# Patient Record
Sex: Female | Born: 1935
Health system: Southern US, Community
[De-identification: ages and names within clinical notes are randomized; demographics above are authoritative.]

## PROBLEM LIST (undated history)

## (undated) DIAGNOSIS — K219 Gastro-esophageal reflux disease without esophagitis: Secondary | ICD-10-CM

## (undated) DIAGNOSIS — E079 Disorder of thyroid, unspecified: Secondary | ICD-10-CM

## (undated) DIAGNOSIS — R011 Cardiac murmur, unspecified: Secondary | ICD-10-CM

## (undated) DIAGNOSIS — I2510 Atherosclerotic heart disease of native coronary artery without angina pectoris: Secondary | ICD-10-CM

## (undated) DIAGNOSIS — I10 Essential (primary) hypertension: Secondary | ICD-10-CM

## (undated) DIAGNOSIS — E7800 Pure hypercholesterolemia, unspecified: Secondary | ICD-10-CM

## (undated) DIAGNOSIS — L989 Disorder of the skin and subcutaneous tissue, unspecified: Secondary | ICD-10-CM

## (undated) DIAGNOSIS — I6529 Occlusion and stenosis of unspecified carotid artery: Secondary | ICD-10-CM

## (undated) DIAGNOSIS — I480 Paroxysmal atrial fibrillation: Secondary | ICD-10-CM

## (undated) DIAGNOSIS — I219 Acute myocardial infarction, unspecified: Secondary | ICD-10-CM

## (undated) HISTORY — PX: ABDOMINAL HYSTERECTOMY: SHX81

## (undated) HISTORY — DX: Disorder of the skin and subcutaneous tissue, unspecified: L98.9

## (undated) HISTORY — PX: THYROID SURGERY: SHX805

## (undated) HISTORY — DX: Paroxysmal atrial fibrillation: I48.0

## (undated) HISTORY — DX: Cardiac murmur, unspecified: R01.1

## (undated) HISTORY — PX: CARDIAC CATHETERIZATION: SHX172

## (undated) HISTORY — DX: Occlusion and stenosis of unspecified carotid artery: I65.29

## (undated) HISTORY — DX: Atherosclerotic heart disease of native coronary artery without angina pectoris: I25.10

---

## 2000-11-20 ENCOUNTER — Ambulatory Visit (HOSPITAL_COMMUNITY): Admission: RE | Admit: 2000-11-20 | Discharge: 2000-11-20 | Payer: Self-pay | Admitting: Family Medicine

## 2000-11-20 ENCOUNTER — Encounter: Payer: Self-pay | Admitting: Family Medicine

## 2001-11-20 ENCOUNTER — Ambulatory Visit (HOSPITAL_COMMUNITY): Admission: RE | Admit: 2001-11-20 | Discharge: 2001-11-20 | Payer: Self-pay | Admitting: Family Medicine

## 2001-11-20 ENCOUNTER — Encounter: Payer: Self-pay | Admitting: Family Medicine

## 2001-11-26 ENCOUNTER — Ambulatory Visit (HOSPITAL_COMMUNITY): Admission: RE | Admit: 2001-11-26 | Discharge: 2001-11-26 | Payer: Self-pay | Admitting: General Surgery

## 2002-11-24 ENCOUNTER — Ambulatory Visit (HOSPITAL_COMMUNITY): Admission: RE | Admit: 2002-11-24 | Discharge: 2002-11-24 | Payer: Self-pay | Admitting: Family Medicine

## 2002-12-10 ENCOUNTER — Ambulatory Visit (HOSPITAL_COMMUNITY): Admission: RE | Admit: 2002-12-10 | Discharge: 2002-12-10 | Payer: Self-pay | Admitting: Family Medicine

## 2003-12-01 ENCOUNTER — Ambulatory Visit (HOSPITAL_COMMUNITY): Admission: RE | Admit: 2003-12-01 | Discharge: 2003-12-01 | Payer: Self-pay | Admitting: Family Medicine

## 2004-08-25 ENCOUNTER — Ambulatory Visit: Payer: Self-pay | Admitting: *Deleted

## 2004-08-30 ENCOUNTER — Ambulatory Visit (HOSPITAL_COMMUNITY): Admission: RE | Admit: 2004-08-30 | Discharge: 2004-08-30 | Payer: Self-pay | Admitting: *Deleted

## 2004-08-30 ENCOUNTER — Ambulatory Visit: Payer: Self-pay | Admitting: Cardiology

## 2004-09-06 ENCOUNTER — Ambulatory Visit: Payer: Self-pay | Admitting: *Deleted

## 2004-09-20 ENCOUNTER — Ambulatory Visit (HOSPITAL_COMMUNITY): Admission: RE | Admit: 2004-09-20 | Discharge: 2004-09-20 | Payer: Self-pay | Admitting: Family Medicine

## 2004-12-02 ENCOUNTER — Ambulatory Visit (HOSPITAL_COMMUNITY): Admission: RE | Admit: 2004-12-02 | Discharge: 2004-12-02 | Payer: Self-pay | Admitting: Family Medicine

## 2004-12-13 ENCOUNTER — Ambulatory Visit (HOSPITAL_COMMUNITY): Admission: RE | Admit: 2004-12-13 | Discharge: 2004-12-13 | Payer: Self-pay | Admitting: General Surgery

## 2005-12-04 ENCOUNTER — Ambulatory Visit (HOSPITAL_COMMUNITY): Admission: RE | Admit: 2005-12-04 | Discharge: 2005-12-04 | Payer: Self-pay | Admitting: Family Medicine

## 2006-03-28 ENCOUNTER — Inpatient Hospital Stay (HOSPITAL_COMMUNITY): Admission: EM | Admit: 2006-03-28 | Discharge: 2006-04-03 | Payer: Self-pay | Admitting: Emergency Medicine

## 2006-04-20 ENCOUNTER — Inpatient Hospital Stay (HOSPITAL_COMMUNITY): Admission: EM | Admit: 2006-04-20 | Discharge: 2006-04-27 | Payer: Self-pay | Admitting: Emergency Medicine

## 2006-04-21 ENCOUNTER — Encounter (INDEPENDENT_AMBULATORY_CARE_PROVIDER_SITE_OTHER): Payer: Self-pay | Admitting: Specialist

## 2006-12-07 ENCOUNTER — Ambulatory Visit (HOSPITAL_COMMUNITY): Admission: RE | Admit: 2006-12-07 | Discharge: 2006-12-07 | Payer: Self-pay | Admitting: Family Medicine

## 2007-01-30 ENCOUNTER — Ambulatory Visit (HOSPITAL_COMMUNITY): Admission: RE | Admit: 2007-01-30 | Discharge: 2007-01-30 | Payer: Self-pay | Admitting: Family Medicine

## 2007-12-10 ENCOUNTER — Ambulatory Visit (HOSPITAL_COMMUNITY): Admission: RE | Admit: 2007-12-10 | Discharge: 2007-12-10 | Payer: Self-pay | Admitting: Family Medicine

## 2008-12-11 ENCOUNTER — Ambulatory Visit (HOSPITAL_COMMUNITY): Admission: RE | Admit: 2008-12-11 | Discharge: 2008-12-11 | Payer: Self-pay | Admitting: Family Medicine

## 2009-05-10 ENCOUNTER — Inpatient Hospital Stay (HOSPITAL_COMMUNITY): Admission: EM | Admit: 2009-05-10 | Discharge: 2009-05-15 | Payer: Self-pay | Admitting: Cardiology

## 2009-12-13 ENCOUNTER — Ambulatory Visit (HOSPITAL_COMMUNITY)
Admission: RE | Admit: 2009-12-13 | Discharge: 2009-12-13 | Payer: Self-pay | Source: Home / Self Care | Attending: Family Medicine | Admitting: Family Medicine

## 2010-01-02 HISTORY — PX: CARDIOVERSION: SHX1299

## 2010-01-22 ENCOUNTER — Encounter: Payer: Self-pay | Admitting: Family Medicine

## 2010-03-22 LAB — CBC
HCT: 34 % — ABNORMAL LOW (ref 36.0–46.0)
HCT: 35.2 % — ABNORMAL LOW (ref 36.0–46.0)
HCT: 38.9 % (ref 36.0–46.0)
Hemoglobin: 12.1 g/dL (ref 12.0–15.0)
Hemoglobin: 12.2 g/dL (ref 12.0–15.0)
Hemoglobin: 12.4 g/dL (ref 12.0–15.0)
Hemoglobin: 12.5 g/dL (ref 12.0–15.0)
Hemoglobin: 13.5 g/dL (ref 12.0–15.0)
MCHC: 34.7 g/dL (ref 30.0–36.0)
MCHC: 34.8 g/dL (ref 30.0–36.0)
MCV: 94.4 fL (ref 78.0–100.0)
MCV: 94.7 fL (ref 78.0–100.0)
MCV: 95.2 fL (ref 78.0–100.0)
Platelets: 259 10*3/uL (ref 150–400)
Platelets: 273 10*3/uL (ref 150–400)
Platelets: 311 10*3/uL (ref 150–400)
RBC: 3.6 MIL/uL — ABNORMAL LOW (ref 3.87–5.11)
RBC: 3.69 MIL/uL — ABNORMAL LOW (ref 3.87–5.11)
RBC: 3.85 MIL/uL — ABNORMAL LOW (ref 3.87–5.11)
RBC: 4.11 MIL/uL (ref 3.87–5.11)
RDW: 12.3 % (ref 11.5–15.5)
RDW: 12.4 % (ref 11.5–15.5)
RDW: 12.5 % (ref 11.5–15.5)
WBC: 4 10*3/uL (ref 4.0–10.5)
WBC: 4.3 10*3/uL (ref 4.0–10.5)
WBC: 5.4 10*3/uL (ref 4.0–10.5)

## 2010-03-22 LAB — COMPREHENSIVE METABOLIC PANEL
ALT: 20 U/L (ref 0–35)
BUN: 25 mg/dL — ABNORMAL HIGH (ref 6–23)
CO2: 24 mEq/L (ref 19–32)
Calcium: 9.4 mg/dL (ref 8.4–10.5)
Creatinine, Ser: 1.07 mg/dL (ref 0.4–1.2)
GFR calc non Af Amer: 49 mL/min — ABNORMAL LOW (ref >60)
Glucose, Bld: 82 mg/dL (ref 70–99)

## 2010-03-22 LAB — TSH: TSH: 0.15 u[IU]/mL — ABNORMAL LOW (ref 0.350–4.500)

## 2010-03-22 LAB — BASIC METABOLIC PANEL
BUN: 19 mg/dL (ref 6–23)
BUN: 24 mg/dL — ABNORMAL HIGH (ref 6–23)
CO2: 23 mEq/L (ref 19–32)
CO2: 26 mEq/L (ref 19–32)
CO2: 27 mEq/L (ref 19–32)
Calcium: 8.8 mg/dL (ref 8.4–10.5)
Calcium: 9.2 mg/dL (ref 8.4–10.5)
Chloride: 108 mEq/L (ref 96–112)
Chloride: 112 mEq/L (ref 96–112)
GFR calc Af Amer: 55 mL/min — ABNORMAL LOW (ref >60)
GFR calc Af Amer: 57 mL/min — ABNORMAL LOW (ref >60)
GFR calc non Af Amer: 39 mL/min — ABNORMAL LOW (ref >60)
GFR calc non Af Amer: 45 mL/min — ABNORMAL LOW (ref >60)
GFR calc non Af Amer: 47 mL/min — ABNORMAL LOW (ref >60)
Glucose, Bld: 91 mg/dL (ref 70–99)
Glucose, Bld: 92 mg/dL (ref 70–99)
Potassium: 4.2 mEq/L (ref 3.5–5.1)
Potassium: 4.5 mEq/L (ref 3.5–5.1)
Sodium: 141 mEq/L (ref 135–145)
Sodium: 141 mEq/L (ref 135–145)
Sodium: 143 mEq/L (ref 135–145)

## 2010-03-22 LAB — T3, FREE: T3, Free: 1.8 pg/mL — ABNORMAL LOW (ref 2.3–4.2)

## 2010-03-22 LAB — PROTIME-INR
INR: 1.16 (ref 0.00–1.49)
INR: 1.5 — ABNORMAL HIGH (ref 0.00–1.49)
Prothrombin Time: 14.7 seconds (ref 11.6–15.2)

## 2010-03-22 LAB — HEPARIN LEVEL (UNFRACTIONATED)
Heparin Unfractionated: 0.48 IU/mL (ref 0.30–0.70)
Heparin Unfractionated: 0.66 IU/mL (ref 0.30–0.70)

## 2010-05-20 NOTE — Discharge Summary (Signed)
[name], [name]               ACCOUNT NO.:  [id number]   MEDICAL RECORD NO.:  [id number]          PATIENT TYPE:  INP   LOCATION:  3712                         FACILITY:  [facility]   PHYSICIAN:  Madaline Savage, M.D.DATE OF BIRTH:  1920/02/26   DATE OF ADMISSION:  03/28/2006  DATE OF DISCHARGE:  04/03/2006                               DISCHARGE SUMMARY   PRIMARY CARE PHYSICIAN:  Dr. Patrica Duel.   DISCHARGE DIAGNOSES:  1. Two-vessel coronary artery disease.  2. Paroxysmal atrial fibrillation, new onset.  3. Coumadin therapy, recent initiation.  4. History of hypertension.  5. Chest pain/stable angina.  6. Hypothyroidism.  7. Normal left ventricular function.   HISTORY OF PRESENT ILLNESS:  [name] is an 77 year old female who  was admitted on March 28, 2006 with atrial fibrillation with rapid  ventricular response which was accompanied by chest pain.  She was seen  by Dr. Chanda Busing on March 27, 2006 and found to be in atrial  fibrillation and she was started on Coumadin, digoxin as well as beta  blocker therapy.  The following day, she was also scheduled for  echocardiogram as well as Persantine Myoview.  The following day, she  experienced rapid atrial fibrillation and presented to see Dr. Sherwood Gambler and  was therefore transported to [facility] for further evaluation  and treatment.  She underwent left heart catheterization and coronary  arteriogram, which revealed high-grade ostial narrowing of the  circumflex, the left main and ostial narrowing of the first diagonal at  the takeoff of the LAD.  There was LVH and normal LV function.  Her  Coumadin was held at that point.  It was decided to proceed with medical  therapy, despite her two-vessel coronary artery disease as opposed to  undergoing surgical intervention; amiodarone therapy was initiated at  that time for atrial fibrillation and she was restarted on her Coumadin.  She continued to experience some  shortness of breath with activity and  was also noted to have a positive H. pylori titer.  On April 01, 2006,  it was noted that her heart rate had dropped into the 30s and 40s  overnight, although she was asymptomatic; her digoxin therapy was  decreased to 0.125 one-half tablet daily, her Lopressor was continued  and 25 b.i.d. and she was monitored once again overnight.  On the day of  discharge, PT was 38.3, her INR was 3.6, RBC 332, hemoglobin 10.6,  hematocrit 30.5.  It was thought that her INR had increased secondary to  antibiotic therapy for her H. pylori.  Her Coumadin was healed and a.m.  labs were followed.  She was complaint-free April 03, 2006.  Her  amiodarone dose was decreased to 300 mg daily and was deemed ready for  discharge by Dr. Alanda Amass on April 02, 2005.  At that time, she had no  reports of GI discomfort and no chest pain.   DIET:  She is to follow a heart-healthy diet.   ACTIVITY:  She is to increase her activity slowly and may shower and  bathe.   FOLLOWUP:  Upon  discharge, she would need to have a PT/INR drawn the  following morning and our office will contact her with the results.  She  is to follow up with Dr. Elsie Lincoln in 2-3 weeks and our office will contact  her for an appointment.   DISCHARGE MEDICATIONS:  1. Coumadin 5 mg daily; she is to hold until her next INR is drawn on      April 04, 2006.  2. Synthroid 100 mcg daily.  3. Benicar HCT 20/2.5.  4. Ogen 1.25 mg daily.  5. Aspirin 81 mg daily.  6. Zocor 40 mg daily.  7. Imdur 30 mg daily.  8. Amiodarone 200 mg; she is to take one and a half tablets daily.  9. Amoxicillin 1000 mg b.i.d. for 9 days.  10.Biaxin 500 mg b.i.d. for 9 days.  11.Nexium 40 mg b.i.d. for 9 days, then 40 mg once daily.  12.Toprol-XL 25 mg daily.  13.She is to discontinue her digoxin.     ______________________________  Charmian Muff, NP    ______________________________  Madaline Savage, M.D.    LS/MEDQ  D:   05/29/2006  T:  05/30/2006  Job:  [id number]   cc:   Patrica Duel, M.D.

## 2010-05-20 NOTE — Cardiovascular Report (Signed)
[name], [name]               ACCOUNT NO.:  [id number]   MEDICAL RECORD NO.:  [id number]          PATIENT TYPE:  INP   LOCATION:  3712                         FACILITY:  [facility]   PHYSICIAN:  Thereasa Solo. Little, M.D. DATE OF BIRTH:  [birth year]   DATE OF PROCEDURE:  03/28/2006  DATE OF DISCHARGE:                            CARDIAC CATHETERIZATION   INDICATIONS:  This 77 year old female has had chest pain for about a  month in duration.  She was seen on March 27, 2006 and was in atrial  fibrillation, started on Coumadin and set up for evaluation.  This  morning she presented to Dr. Sharyon Medicus office in rapid atrial  fibrillation, continued to have chest pain and was sent to [facility]  Emergency Room for evaluation.   After obtaining informed consent the patient was prepped and draped in  the usual sterile fashion exposing the right groin.  Following  local  anesthetic with 1% Xylocaine, the Seldinger technique was employed and a  5-French introducer sheath was placed into the right femoral artery.  Left and right coronary arteriography, ventriculography in the RAO  projection and a distal aortogram was performed.   EQUIPMENT:  5-French Judkins configuration catheters.   TOTAL CONTRAST USED:  100 mL.   COMPLICATIONS:  None.   RESULTS:  1. Hemodynamic monitoring:  Central aortic pressure was 184/80.  Left      ventricular pressure was 184/22 with no significant valve gradient      at the time of pull-back.  2. Ventriculography:  Ventriculography in the RAO projection using 20      mL of contrast, 12 mL per second, revealed normal LV systolic      function, mild left ventricular hypertrophy.  Ejection fraction      excess of 60%  and the end-diastolic pressure was 25.  3. Distal aortogram:  Distal aortogram done above the level of renal      artery showed no evidence of renal artery stenosis and no abdominal      aortic aneurysm.   CORONARY ARTERIOGRAPHY:  On fluoroscopy, faint  calcification was seen in  the distribution of the left main,  proximal LAD and proximal right  coronary artery.   1. Left main normal.  It trifurcated.  2. Circumflex.  There was ostial 80% narrowing of the circumflex as it      came off the left main.  The mid and distal vessel were free of      disease.  3. Ramus intermediate (optional diagonal) moderate size and normal.  4. Left anterior descending.  The LAD extended down to the apex of the      heart.  The distal LAD was free of disease.  The mid portion of the      LAD had an area of 30-40% narrowing at the takeoff of first      diagonal.  The first diagonal had an 85%  area of ostial narrowing.  5. Right coronary artery.  The right coronary artery gave rise to the      PDA.  This vessel was free of disease.  Several injections of the      cusp did not show any evidence of ostial narrowing.   CONCLUSION:  1. High-grade ostial narrowing of the circumflex at the left main.  2. Ostial narrowing of the first diagonal at the take-off of the LAD.  3. Left ventricular hypertrophy.  4. Normal LV systolic function.   It appears this lady will need bypass surgery because of the ostial  circumflex lesion.  I plan to ask my colleagues for their opinion before  we refer her to CV[facility].  She will be restarted on IV heparin in about 3  hours following her cath.  I have temporarily stopped her Coumadin.           ______________________________  Thereasa Solo. Little, M.D.     ABL/MEDQ  D:  03/28/2006  T:  03/29/2006  Job:  [id number]   cc:   Madaline Savage, M.D.  Madelin Rear. Sherwood Gambler, MD

## 2010-05-20 NOTE — H&P (Signed)
[name], FITE               ACCOUNT NO.:  [id number]   MEDICAL RECORD NO.:  [id number]          PATIENT TYPE:  AMB   LOCATION:                                FACILITY:  [facility]   PHYSICIAN:  Dalia Heading, M.D.  DATE OF BIRTH:  1920/04/07   DATE OF ADMISSION:  DATE OF DISCHARGE:  LH                                HISTORY & PHYSICAL   CHIEF COMPLAINT:  History of colon polyps.   HISTORY OF PRESENT ILLNESS:  The patient is an 77 year old black female  status post a colonoscopy with polypectomy for a tubular adenoma in the  ascending colon.  In 2003 who now presents for a followup colonoscopy.  She  has been asymptomatic.  There is no family history of colon carcinoma.   PAST MEDICAL HISTORY:  1.  Hypertension.  2.  Hypothyroidism.   PAST SURGICAL HISTORY:  As noted above, hysterectomy, thyroid surgery.   CURRENT MEDICATIONS:  Flexeril, Norvasc, Synthroid, estrogen supplements,  baby aspirin, hydrochlorothiazide, Ativan, Nexium, lisinopril, Allegra.   ALLERGIES:  No known drug allergies.   REVIEW OF SYSTEMS:  Noncontributory.   PHYSICAL EXAMINATION:  GENERAL:  On physical examination, the patient is a  well-developed well-nourished black female in no acute distress.  LUNGS:  Clear to auscultation with equal breath sounds bilaterally.  HEART:  Heart examination reveals a regular rate and rhythm without S3, S4,  or murmurs.  ABDOMEN:  The abdomen is soft, nontender, nondistended.  No  hepatosplenomegaly, or masses are noted.  RECTAL:  Examination was deferred to the procedure.   IMPRESSION:  History of colon polyp.   PLAN:  The patient is scheduled for a colonoscopy on 12/13/2004.  The risks  and benefits of the procedure including bleeding and perforation were fully  explained to the patient, who gave informed consent.      Dalia Heading, M.D.  Electronically Signed     MAJ/MEDQ  D:  12/01/2004  T:  12/01/2004  Job:  [id number]   cc:   [facility]  Fax: [phone]   Oneal Deputy. Juanetta Gosling, M.D.  Fax: [phone]

## 2010-05-20 NOTE — H&P (Signed)
   NAMEDEYCI, GESELL NO.:  [id number]   MEDICAL RECORD NO.:  [id number]                  PATIENT TYPE:   LOCATION:                                       FACILITY:   PHYSICIAN:  Dalia Heading, M.D.               DATE OF BIRTH:   DATE OF ADMISSION:  DATE OF DISCHARGE:                                HISTORY & PHYSICAL   DATE OF BIRTH:  1920-04-29.   CHIEF COMPLAINT:  Need for screening colonoscopy.   HISTORY OF PRESENT ILLNESS:  The patient is a 77 year old black female who  was referred for screening colonoscopy.  She denies any abdominal  complaints.  She has never had a colonoscopy.  She denies any immediate  family history of colon carcinoma.   PAST MEDICAL HISTORY:  1. Hypertension.  2. Hypothyroidism.   PAST SURGICAL HISTORY:  1. Hysterectomy.  2. Thyroid surgery.   CURRENT MEDICATIONS:  1. A thyroid pill.  2. Hormone replacement pills.  3. Norvasc.  4. A fluid pill.   ALLERGIES:  NO KNOWN DRUG ALLERGIES.   REVIEW OF SYSTEMS:  Noncontributory.   PHYSICAL EXAMINATION:  GENERAL:  The patient is a well-developed, well-  nourished black female in no distress.  VITAL SIGNS:  She is afebrile, vital signs are stable.  LUNGS:  Clear to auscultation with equal breath sounds bilaterally.  HEART:  Regular rate and rhythm without S3, S4, or murmurs.  ABDOMEN:  Unremarkable.  RECTAL:  Examination was deferred to the procedure.   IMPRESSION:  Need for screening colonoscopy.    PLAN:  The patient is scheduled for a colonoscopy on 11/26/2001.  The risks  and benefits of the procedure, including bleeding and perforation were fully  explained to the patient, and she gave informed consent.                                                Dalia Heading, M.D.    MAJ/MEDQ  D:  11/21/2001  T:  11/21/2001  Job:  [id number]

## 2010-05-20 NOTE — Op Note (Signed)
NAMEDELYNN, OLVERA               ACCOUNT NO.:  [id number]   MEDICAL RECORD NO.:  [id number]          PATIENT TYPE:  INP   LOCATION:  2550                         FACILITY:  [facility]   PHYSICIAN:  Ardeth Sportsman, MD     DATE OF BIRTH:  1920/07/08   DATE OF PROCEDURE:  04/21/2006  DATE OF DISCHARGE:                               OPERATIVE REPORT   SURGEON:  Ardeth Sportsman, MD.   ASSISTANT:  Angelia Mould. Derrell Lolling, MD.   PREOPERATIVE DIAGNOSIS:  Hematoperitoneum, status post fall, with  significant drop in hematocrit.   POSTOPERATIVE DIAGNOSES:  1. Hematoperitoneum.  2. Proximal jejunal contusion with circumferential wall hematoma and      mesenteric injury.   PROCEDURES PERFORMED:  1. Exploratory laparotomy.  2. Lysis of adhesions.  3. Proximal jejunal-small bowel resection with stapled side-to-side      anastomosis.  4. Repair of mesenteric injury.   SPECIMENS:  Proximal jejunum.   DRAINS:  None.   ESTIMATED BLOOD LOSS:  450 mL of old blood plus 50 mm of active blood,  equals 500 total.   COMPLICATIONS:  No major complications apparent.   INDICATIONS:  Miss Rinck is an 77 year old female on chronic  anticoagulation, her fell in her bathroom and ended up having cervical  and orbital fractures.  She was admitted on telemetry and monitoring,  and her hemoglobin dropped from 13 down to 7, with lightheadedness and  worsening abdominal pain.  CAT scan showed evidence of free fluid in the  abdomen, with Hounsfield units correlating with hemoperitoneum, as well  as some small bowel and mesenteric thickening, concerning for mesenteric  injury.  The spleen seemed somewhat equivocal, possibly injured as well.   Differential diagnoses were explained.  Options were discussed.  Recommendation was made for emergent exploration, given the sudden drop  in hemoglobin.  Risks such as stroke, heart attack, deep vein  thrombosis, pulmonary embolism and death were discussed, and risks such  as bleeding, need for transfusion, wound infection, abscess, injury to  other organs and other risks were discussed.  Questions were answered  and she and her family agreed to proceed.   OPERATIVE FINDINGS:  She had 450 mL of old blood.  Her solid organs  appeared to be normal.  In her proximal jejunum about a foot distal to  the ligament of Treitz, she had a segment of jejunum that had  circumferential wall-thickness hematoma and near occlusion, with  mesenteric contusion as well.  She had no significant retroperitoneal  bleeding and no other significant injuries, except for some small  mesenteric tears as well.   DESCRIPTION OF PROCEDURES:  Informed consent was confirmed.  The patient  received IV cefazolin.  She had sequential compression devices active  for the entire case.  She underwent general anesthesia without  difficulty.  She had a Foley catheter placed.  She was positioned supine  with her arms out.  Her abdomen was prepped and draped in a sterile  fashion.   Entry was gained in the abdomen through a midline incision.  I  encountered a fair amount of  old blood in all 4 quadrants.  There were  no major clots.  These were all aspirated out.  Inspection was made in  the right upper quadrant, seeing no evidence of any obvious injury, and  the same thing in the left upper quadrant.  Down in the pelvis, there  was some evidence of old blood as well.  There were some omental  attachments and small bowel attachments in the pelvis that were freed  up, using careful controlled, primarily sharp, as well as controlled  cautery dissection.  The small bowel was run from the terminal ileum all  the way to the ligament of Treitz.  In the proximal jejunum, small bowel  thickness and hematoma were encountered, as noted above.  There were  some mesenteric tears, that seemed to be not particularly actively  bleeding at this time.  The colon was run from the right colon all the  way down to  the sigmoid-rectal junction at the peritoneal reflection  with no evidence of injury.  A window was made in the lesser sac.  Inspection showed a little bit of old blood, but no evidence of any  active bleeding.  The pancreas and the posterior wall of the stomach and  anterior wall of the stomach appeared to be normal as well.  She did not  have any significant hematoma along her duodenal sweep either.  Copious  irrigation was done with ultimately pretty good clear return.  She had  some oozing down in her pelvis where this was freed up.  The spleen and  liver were reinspected with no evidence of any injury.   Small bowel resection was formed and a side-to-side anastomosis with  stapling, using a fire of 2 stapling loads.  The mesentery was taken  using the LigaSure with a good result.  This left a wide-open  anastomosis.  There was a little bit of bruising of the wall, but the  anastomosis appeared to be nice and viable.  The mesenteric defect was  closed using interrupted figure-of-eight 3-0 silk stitches.  Again,  copious irrigation was done.  Some small mesenteric tears were  controlled and had hemostasis insured.  Again, on reirrigation and  reexploration in all 4 quadrants of pelvis, there was noted no evidence  of any other injury.  The small bowel was allowed to rest naturally.  Greater omentum was covered in the midline.  The midline defect was  closed using #1 looped PDS x2.  Copious irrigation of the wound.  The  wound was gently approximated using staples. Triple antibiotic and  sterile dressings were applied.  The patient was extubated and sent to  the recovery room in stable condition.   I explained the operative findings to the patient's family and probable  postoperative recovery, including at least monitoring in the Step-Down  tonight, which was discussed as well.  Questions were answered and they  expressed understanding and appreciation.     Ardeth Sportsman, MD   Electronically Signed     SCG/MEDQ  D:  04/21/2006  T:  04/21/2006  Job:  [id number]   cc:   Patrica Duel, M.D.

## 2010-05-20 NOTE — Consult Note (Signed)
[name], [name]               ACCOUNT NO.:  [id number]   MEDICAL RECORD NO.:  [id number]          PATIENT TYPE:  INP   LOCATION:  2550                         FACILITY:  [facility]   PHYSICIAN:  Stefani Dama, M.D.  DATE OF BIRTH:  1920/01/10   DATE OF CONSULTATION:  04/20/2006  DATE OF DISCHARGE:                                 CONSULTATION   REASON FOR REQUEST:  C5-C6 spinous process fractures.   HISTORY OF PRESENT ILLNESS:  [name] in an 77 year old individual  who sustained a fall today.  She apparently felt lightheaded and dizzy  and hit her head on the left side.  She sustained fractures in the  spinous processes of C5-C6.  Patient denies any pain in her neck.  She  denies any numbness or tingling after she had the fall or currently in  her arms or hands.   PAST MEDICAL HISTORY:  Significant for:  1. Chronic Coumadin usage with chronic atrial fibrillation.  2. Hypertension.  3. Hypothyroidism.   Her physical exam at the current time reveals that she is an alert  oriented individual with no overt distress.  She has a left periorbital  ecchymosis.  The neck is nontender to palpation.  No masses are noted.  Her motor strength is good in the upper extremities with normal grip and  intrinsic function.  Normal strength in deltoids, biceps and triceps.  Lower extremities move fluidly.  Deep tendon reflexes are 1+ in the  biceps, triceps, and the brachioradialis, 2+ in the patellae and  Achilles.   IMPRESSION:  The patient has evidence of spinous processes fractures of  C5-C6 which are minimally displaced.  There is no gross instability in  the cervical spine.  I believe the patient would best be treated  conservatively in a hard cervical collar for a period of about four  weeks.  I will follow up with her on an outpatient basis.      Stefani Dama, M.D.  Electronically Signed     HJE/MEDQ  D:  04/20/2006  T:  04/21/2006  Job:  [id number]

## 2010-05-20 NOTE — Discharge Summary (Signed)
[name], [name]               ACCOUNT NO.:  [id number]   MEDICAL RECORD NO.:  [id number]          PATIENT TYPE:  INP   LOCATION:  6708                         FACILITY:  [facility]   PHYSICIAN:  Maisie Fus A. Cornett, M.D.DATE OF BIRTH:  08/26/1920   DATE OF ADMISSION:  04/20/2006  DATE OF DISCHARGE:  04/27/2006                               DISCHARGE SUMMARY   DISCHARGE DIAGNOSES:  1. Fall.  2. Mesenteric and small-bowel injury, status post exploratory      laparotomy with repair and small-bowel resection.  3. Atrial fibrillation.  4. C5-6 fracture.  5. Acute blood loss anemia.  6. Left orbital fracture   CONSULTANTS:  1. [facility].  2. Dr. Danielle Dess for Neurosurgery.   PROCEDURES:  Exploratory laparotomy with proximal jejunal resection.   HISTORY OF PRESENT ILLNESS:  This is an 77 year old black female who  fell coming from her bathroom in her home, striking the left side of her  head.  She came in as a non-trauma code and workup in the emergency  department should 5 and 6 process fractures extending into the lamina,  also with a nondisplaced left orbital rim fracture.  She became  nauseated and had emesis while being transported.  Her workup  demonstrated the cervical fractures, the atrial fibrillation and the  orbital fracture, as well as a supratherapeutic INR.  She was admitted  to Telemetry to correct her INR and observe her and Cardiology and  Neurosurgery were consulted.   HOSPITAL COURSE:  The patient remained stable from a cardiovascular  standpoint.  She was placed in a collar in order to stabilize her C-  spine fractures.  The day following admission, she was having some  increased abdominal pain and generally did not feel good.  A repeat CBC  had shown a significant drop in her hemoglobin and she was taken  urgently to the operating room for exploratory laparotomy; this  demonstrated a small bowel injury with mesenteric tear  in the jejunum,  which was repaired.  She then had the usual postoperative ileus which  resolved with time.  She was somewhat slow to mobilize, but eventually  was able to do so and eventually we were able to transfer her to home  with home health.   DISCHARGE MEDICATIONS:  1. Vicodin 5/500 take one to two p.o. q.6 h. p.r.n. pain.  2. In addition, she is to resume her home medications.   FOLLOWUP:  The patient will follow up in the trauma service clinic  within the next week.      Earney Hamburg, P.A.      Thomas A. Cornett, M.D.  Electronically Signed    MJ/MEDQ  D:  06/22/2006  T:  06/23/2006  Job:  [id number]

## 2010-05-20 NOTE — H&P (Signed)
NAMESHELISHA, [name]               ACCOUNT NO.:  [id number]   MEDICAL RECORD NO.:  [id number]          PATIENT TYPE:  INP   LOCATION:  2014                         FACILITY:  [facility]   PHYSICIAN:  Maria Dare. Janee Mayo, M.D.DATE OF BIRTH:  01/30/1920   DATE OF ADMISSION:  04/20/2006  DATE OF DISCHARGE:                              HISTORY & PHYSICAL   CHIEF COMPLAINT:  Head and neck pain after a fall.   HISTORY OF PRESENT ILLNESS:  The patient is an 77 year old African-  American female who has a history of atrial fibrillation and coronary  artery disease.  She recently underwent cardiac cath a couple of weeks  ago by Dr. Clarene Mayo.  She was coming back from the bathroom at home this  morning and she fell striking her left side of her forehead.  Workup in  the emergency department shows C5 and C6 spinous process fractures  extending into the lamina.  She also has a nondisplaced left orbital rim  fracture.  She had some nausea and vomiting during transport.  She was  also found here to have an INR greater than 10.2   PAST MEDICAL HISTORY:  1. Coronary artery disease.  2. Hypertension.  3. Atrial fibrillation.   PAST SURGICAL HISTORY:  1. Thyroidectomy.  2. Hysterectomy.   SOCIAL HISTORY:  She denies smoking and drinking.   ALLERGIES:  NO KNOWN DRUG ALLERGIES.   MEDICATIONS:  1. Ativan 0.5 mg p.r.n.  2. Amiodarone 200 mg daily.  3. Aspirin 81 mg daily.  4. Coumadin 5 mg daily.  5. Hydrochlorothiazide 25 mg daily.  6. Zocor 40 mg daily.  7. Ogen 1.25 mg daily.  8. Isosorbide mononitrate of unknown dose.  9. Levothyroxine 100 mcg daily.  10.Metoprolol  25 mg daily.  11.Nexium 40 mg daily.  12.Nitroglycerin p.r.n.   CARDIOLOGIST:  Maria Busing, MD   REVIEW OF SYSTEMS:  Significant for:  Neurologic system:  Headache.  Abdominal symptoms:  Recent stomach pain and nausea and vomiting en  route, otherwise significant for headache and neck pain as described  above.   PHYSICAL  EXAMINATION:  VITAL SIGNS:  Temperature is 96.9, pulse 98,  respirations 18, blood pressure 126/53, saturations are 98%.  HEENT EXAM:  She has got an ecchymosis and edema of her left orbital rim  area.  Extraocular muscles, however, are intact.  Pupils are 3 mm and  reactive.  Ears have cerumen bilaterally but no obvious hemotympanum.  NECK:  Has some mild posterior midline tenderness without step-offs.  LUNGS:  Clear to auscultation with decent respiratory excursion.  HEART:  Irregularly regular, and pulse is palpable in the left chest.  ABDOMEN:  Soft and nontender.  Bowel sounds are hypoactive but present.  Pelvis is stable from a bone standpoint.  MUSCULOSKELETAL EXAM:  Arms and legs are without deformity or noted  abnormality.  Back has no step-offs or tenderness.  NEUROLOGIC EXAM:  Glasgow coma scale is 15.  Strength is good in  bilateral upper and lower extremities.   LABORATORY STUDIES:  Sodium 135, potassium 3.2, chloride 104, BUN 20,  glucose 149, white  blood cell count 13.3, hemoglobin 12.5, platelets  443.  EKG shows atrial fibrillation with rapid ventricular response.  Chest x-ray is pending.  CT scan of the head is negative.  CT scan of  the neck shows C5 and C6 spinous process fractures extending into the  lamina and severe degenerative disk disease.   IMPRESSION:  An 77 year old African-American female who is status post a  fall and:  1. C5 and C6 fractures.  2. Atrial fibrillation.  3. Left orbital fracture.  4. Over anticoagulation.  5. Hypertension.  6. Coronary artery disease.   PLAN:  We admitted her to the Trauma Service on telemetry floor.  They  will correct her INR with fresh frozen plasma and vitamin K, and we will  obtain cardiology and neurosurgery consults.      [name], M.D.  Electronically Signed     BET/MEDQ  D:  04/20/2006  T:  04/20/2006  Job:  [id number]   cc:   [name], M.D.  [name], M.D.

## 2010-11-24 ENCOUNTER — Other Ambulatory Visit: Payer: Self-pay

## 2010-11-24 ENCOUNTER — Emergency Department (HOSPITAL_COMMUNITY)
Admission: EM | Admit: 2010-11-24 | Discharge: 2010-11-24 | Disposition: A | Discharge status: Home / Self Care | Payer: Medicare Other | Attending: Emergency Medicine | Admitting: Emergency Medicine

## 2010-11-24 ENCOUNTER — Encounter: Payer: Self-pay | Admitting: Emergency Medicine

## 2010-11-24 DIAGNOSIS — R079 Chest pain, unspecified: Secondary | ICD-10-CM | POA: Insufficient documentation

## 2010-11-24 DIAGNOSIS — Z9079 Acquired absence of other genital organ(s): Secondary | ICD-10-CM | POA: Insufficient documentation

## 2010-11-24 DIAGNOSIS — I10 Essential (primary) hypertension: Secondary | ICD-10-CM | POA: Insufficient documentation

## 2010-11-24 DIAGNOSIS — E079 Disorder of thyroid, unspecified: Secondary | ICD-10-CM | POA: Insufficient documentation

## 2010-11-24 DIAGNOSIS — R42 Dizziness and giddiness: Secondary | ICD-10-CM

## 2010-11-24 DIAGNOSIS — I252 Old myocardial infarction: Secondary | ICD-10-CM | POA: Insufficient documentation

## 2010-11-24 DIAGNOSIS — E7800 Pure hypercholesterolemia, unspecified: Secondary | ICD-10-CM | POA: Insufficient documentation

## 2010-11-24 DIAGNOSIS — K219 Gastro-esophageal reflux disease without esophagitis: Secondary | ICD-10-CM | POA: Insufficient documentation

## 2010-11-24 HISTORY — DX: Gastro-esophageal reflux disease without esophagitis: K21.9

## 2010-11-24 HISTORY — DX: Pure hypercholesterolemia, unspecified: E78.00

## 2010-11-24 HISTORY — DX: Essential (primary) hypertension: I10

## 2010-11-24 HISTORY — DX: Disorder of thyroid, unspecified: E07.9

## 2010-11-24 HISTORY — DX: Acute myocardial infarction, unspecified: I21.9

## 2010-11-24 LAB — CBC
HCT: 44.6 % (ref 36.0–46.0)
MCH: 31.7 pg (ref 26.0–34.0)
MCV: 91.8 fL (ref 78.0–100.0)
WBC: 4.9 10*3/uL (ref 4.0–10.5)

## 2010-11-24 LAB — DIFFERENTIAL
Eosinophils Absolute: 0.1 10*3/uL (ref 0.0–0.7)
Eosinophils Relative: 2 % (ref 0–5)
Lymphs Abs: 1.3 10*3/uL (ref 0.7–4.0)
Monocytes Relative: 8 % (ref 3–12)

## 2010-11-24 LAB — BASIC METABOLIC PANEL
BUN: 20 mg/dL (ref 6–23)
Calcium: 9.9 mg/dL (ref 8.4–10.5)
GFR calc non Af Amer: 49 mL/min — ABNORMAL LOW (ref >90)
Glucose, Bld: 91 mg/dL (ref 70–99)

## 2010-11-24 LAB — CARDIAC PANEL(CRET KIN+CKTOT+MB+TROPI): Troponin I: <0.3 ng/mL (ref <0.30)

## 2010-11-24 MED ORDER — MECLIZINE HCL 12.5 MG PO TABS
25.0000 mg | ORAL_TABLET | Freq: Once | ORAL | Status: AC
  Administered 2010-11-24: 25 mg via ORAL
  Filled 2010-11-24: qty 2

## 2010-11-24 MED ORDER — MECLIZINE HCL 25 MG PO TABS: 25.0000 mg | ORAL_TABLET | Freq: Three times a day (TID) | ORAL | Status: AC | PRN

## 2010-11-24 NOTE — ED Provider Notes (Signed)
Scribed for Maria Bonier, MD, the patient was seen in room APA14/APA14. This chart was scribed by [employer]. The patient's care started at 09:15  CSN: [id number] Arrival date & time: 11/24/2010  9:21 AM   First MD Initiated Contact with Patient 11/24/10 0915      Chief Complaint  Patient presents with  . Chest Pain  . Dizziness   HPI EDP attending visit. [name] is a 77 y.o. female who presents to the Emergency Department complaining of dizziness and chest pain. She states that this morning at about 7am she noticed that the room started spinning around her. She reports that symptoms are alleviated by laying still. She denies chest pain at this time.  Past Medical History  Diagnosis Date  . Myocardial infarct   . Hypertension   . Hypercholesteremia   . Thyroid disease   . Acid reflux     Past Surgical History  Procedure Date  . Abdominal hysterectomy   . Thyroid surgery     History reviewed. No pertinent family history.  History  Substance Use Topics  . Smoking status: Not on file  . Smokeless tobacco: Not on file  . Alcohol Use: No    OB History    Grav Para Term Preterm Abortions TAB SAB Ect Mult Living                  Review of Systems  Constitutional: Negative for fever.       10 Systems reviewed and are negative for acute change except as noted in the HPI.  HENT: Negative for congestion.   Eyes: Negative for discharge and redness.  Respiratory: Negative for cough and shortness of breath.   Cardiovascular: Negative for chest pain.  Gastrointestinal: Negative for vomiting and abdominal pain.  Musculoskeletal: Negative for back pain.  Skin: Negative for rash.  Neurological: Positive for dizziness. Negative for syncope, numbness and headaches.  Psychiatric/Behavioral:       No behavior change.  All other systems reviewed and are negative.    Allergies  Review of patient's allergies indicates no known allergies.  Home Medications    Current Outpatient Rx  Name Route Sig Dispense Refill  . AMIODARONE HCL 200 MG PO TABS Oral Take 100 mg by mouth daily.      Marland Kitchen AMLODIPINE BESYLATE 5 MG PO TABS Oral Take 5 mg by mouth daily.      . ASPIRIN EC 81 MG PO TBEC Oral Take 81 mg by mouth daily.      . ATORVASTATIN CALCIUM 20 MG PO TABS Oral Take 20 mg by mouth at bedtime.      . GUAIFENESIN 100 MG/5ML PO SYRP Oral Take 100 mg by mouth 3 (three) times daily as needed. Cough Syrup     . LEVOTHYROXINE SODIUM 75 MCG PO TABS Oral Take 75 mcg by mouth daily.      Marland Kitchen LOSARTAN POTASSIUM 100 MG PO TABS Oral Take 100 mg by mouth daily.      Marland Kitchen OMEPRAZOLE 20 MG PO CPDR Oral Take 20 mg by mouth daily.        BP 165/73  Pulse 63  Temp 97.8 F (36.6 C)  Resp 18  SpO2 100%  Physical Exam  Nursing note and vitals reviewed. Constitutional: She is oriented to person, place, and time. She appears well-developed and well-nourished. No distress.  HENT:  Head: Normocephalic.  Right Ear: Tympanic membrane normal.  Left Ear: Tympanic membrane normal.  Eyes: EOM are  normal. Right eye exhibits no nystagmus. Left eye exhibits no nystagmus.       No nystagmus with head turning  Neck: Normal range of motion.  Cardiovascular: Normal rate, regular rhythm and normal heart sounds.   No murmur heard. Pulmonary/Chest: Effort normal.  Musculoskeletal: Normal range of motion.  Neurological: She is alert and oriented to person, place, and time.       Normal finger to nose to finger test. Normal coordination. No past pointing Normal DTRs bilaterally  Skin: Skin is warm and dry. No erythema.  Psychiatric: She has a normal mood and affect.    ED Course  Procedures DIAGNOSTIC STUDIES: Oxygen Saturation is 100% on room air, normal by my interpretation.    COORDINATION OF CARE: 10:18 - EDP examined patient at bedside.   MDM: The patient has reproducible symptoms with movement of her head suggestive of benign positional vertigo, especially when  coupled with the fact that she gets relief of the vertigo as she holds her head still. She additionally has a history, I am told, of peripheral vertigo. She does not have any cerebellar signs to suggest a central cause of vertigo.   Scribe Attesation I personally performed the services described in this documentation, which was scribed in my presence. The recorded information has been reviewed and considered.  Evaluation and management procedures were performed by the PA/NP under my supervision/collaboration.           Maria Bonier, MD 11/24/10 1026

## 2010-11-24 NOTE — ED Notes (Signed)
Pt c/o dizziness since 7am today. Cp to central chest "for a second or two" then went away.no meds in route. Pt had yellow pill in mouth upon arrival but states it was a aspirin. Pt denies cp at this time. Pt dizzy on with movement now. Alert/oriented. Nondiaphoretic. Denies n/v/d

## 2010-11-24 NOTE — ED Provider Notes (Signed)
History     CSN: [id number] Arrival date & time: 11/24/2010  9:21 AM   First MD Initiated Contact with Patient 11/24/10 0915      Chief Complaint  Patient presents with  . Chest Pain  . Dizziness    (Consider location/radiation/quality/duration/timing/severity/associated sxs/prior treatment) HPI Comments: Patient woke at 7 am to go to the bathroom,  Attempting to get out of bed caused extreme dizziness,  Describing the room and bed was spinning when she was sitting on the side of her bed.  She has had a distant history of vertigo,  Which this reminds her of.  She also had a fleeting episode of lower sternal to epigastric "scratching" feeling which lasted a few seconds and is now resolved.  She denies any focal weakness,  Changes in vision,  Headache,  No n/v or shortness of breath.    Patient is a 77 y.o. female presenting with chest pain. The history is provided by the patient and a relative.  Chest Pain The chest pain began 3 - 5 hours ago. The chest pain is resolved. Associated with: She denies nausea,  vomiting and shortness of breath. At its most intense, the pain is at 2/10. The pain is currently at 0/10. The quality of the pain is described as brief. The pain does not radiate. Primary symptoms include dizziness. Pertinent negatives for primary symptoms include no fever, no shortness of breath, no abdominal pain and no nausea.  Dizziness does not occur with tinnitus, nausea or weakness.  Pertinent negatives for associated symptoms include no numbness and no weakness. She tried aspirin for the symptoms.  Her past medical history is significant for arrhythmia, CAD and MI.     Past Medical History  Diagnosis Date  . Myocardial infarct   . Hypertension   . Hypercholesteremia   . Thyroid disease   . Acid reflux     Past Surgical History  Procedure Date  . Abdominal hysterectomy   . Thyroid surgery     History reviewed. No pertinent family history.  History  Substance Use  Topics  . Smoking status: Not on file  . Smokeless tobacco: Not on file  . Alcohol Use: No    OB History    Grav Para Term Preterm Abortions TAB SAB Ect Mult Living                  Review of Systems  Constitutional: Negative for fever.  HENT: Negative for congestion, sore throat, neck pain, sinus pressure and tinnitus.   Eyes: Negative.   Respiratory: Negative for chest tightness and shortness of breath.   Cardiovascular: Positive for chest pain.  Gastrointestinal: Negative for nausea and abdominal pain.  Genitourinary: Negative.   Musculoskeletal: Negative for joint swelling and arthralgias.  Skin: Negative.  Negative for rash and wound.  Neurological: Positive for dizziness. Negative for tremors, facial asymmetry, speech difficulty, weakness, light-headedness, numbness and headaches.  Hematological: Negative.   Psychiatric/Behavioral: Negative.     Allergies  Review of patient's allergies indicates no known allergies.  Home Medications  No current outpatient prescriptions on file.  BP 165/73  Pulse 63  Temp 97.8 F (36.6 C)  Resp 18  SpO2 100%  Physical Exam  Nursing note and vitals reviewed. Constitutional: She is oriented to person, place, and time. She appears well-developed and well-nourished.  HENT:  Head: Normocephalic and atraumatic.  Mouth/Throat: Oropharynx is clear and moist.  Eyes: EOM are normal. Pupils are equal, round, and reactive to light.  Neck: Normal range of motion. Neck supple.  Cardiovascular: Normal rate, normal heart sounds and intact distal pulses.   Pulmonary/Chest: Effort normal. No respiratory distress.  Abdominal: Soft. She exhibits no distension. There is no tenderness.  Musculoskeletal: Normal range of motion.  Lymphadenopathy:    She has no cervical adenopathy.  Neurological: She is alert and oriented to person, place, and time. She has normal strength and normal reflexes. She displays normal reflexes. No cranial nerve deficit  or sensory deficit. She exhibits normal muscle tone. She displays a negative Romberg sign. Coordination and gait normal. GCS eye subscore is 4. GCS verbal subscore is 5. GCS motor subscore is 6.       Normal heel-shin test.  Dizziness worsened with attempts to sit up,  Not reproduced with rotation of head right or left while supine.  No nystagmus.  Skin: Skin is warm and dry. No rash noted.  Psychiatric: She has a normal mood and affect. Her speech is normal and behavior is normal. Thought content normal. Cognition and memory are normal.    ED Course  Procedures (including critical care time)   Labs Reviewed  CBC  DIFFERENTIAL  BASIC METABOLIC PANEL  I-STAT TROPONIN I   No results found.   No diagnosis found.  10:50 - patient has obtained partial relief from meclizine.   MDM  Discussed patient with Dr. Fredricka Bonine who also saw pt.  Will dispo home with meclizine,  Fall precautions discussed.  Follow up Dr Phillips Odor on Monday,  Sooner (or return here) if symptoms worsen.  Classic vertigo with history of same.  Patients labs and/or radiological studies were reviewed during the medical decision making and disposition process.         Candis Musa, PA 11/24/10 1107   Date: 11/24/2010  Rate: 66  Rhythm: normal sinus rhythm  QRS Axis: normal  Intervals: normal  ST/T Wave abnormalities: normal  Conduction Disutrbances:none  Narrative Interpretation: Q waves in anterior leads,  Unchanged.  Old EKG Reviewed: unchanged    Candis Musa, PA 11/24/10 1111

## 2010-11-27 NOTE — ED Provider Notes (Signed)
Evaluation and management procedures were performed by the PA/NP under my supervision/collaboration.    [name], MD 11/27/10 [phone]

## 2010-11-28 ENCOUNTER — Other Ambulatory Visit (HOSPITAL_COMMUNITY): Payer: Self-pay | Admitting: Family Medicine

## 2010-11-28 DIAGNOSIS — Z139 Encounter for screening, unspecified: Secondary | ICD-10-CM

## 2010-12-16 ENCOUNTER — Ambulatory Visit (HOSPITAL_COMMUNITY)
Admission: RE | Admit: 2010-12-16 | Discharge: 2010-12-16 | Disposition: A | Discharge status: Home / Self Care | Payer: Medicare Other | Source: Ambulatory Visit | Attending: Family Medicine | Admitting: Family Medicine

## 2010-12-16 DIAGNOSIS — Z139 Encounter for screening, unspecified: Secondary | ICD-10-CM

## 2010-12-16 DIAGNOSIS — Z1231 Encounter for screening mammogram for malignant neoplasm of breast: Secondary | ICD-10-CM | POA: Insufficient documentation

## 2011-11-13 ENCOUNTER — Other Ambulatory Visit (HOSPITAL_COMMUNITY): Payer: Self-pay | Admitting: Family Medicine

## 2011-11-13 DIAGNOSIS — Z139 Encounter for screening, unspecified: Secondary | ICD-10-CM

## 2011-12-18 ENCOUNTER — Ambulatory Visit (HOSPITAL_COMMUNITY)
Admission: RE | Admit: 2011-12-18 | Discharge: 2011-12-18 | Disposition: A | Discharge status: Home / Self Care | Payer: Medicare Other | Source: Ambulatory Visit | Attending: Family Medicine | Admitting: Family Medicine

## 2011-12-18 DIAGNOSIS — Z139 Encounter for screening, unspecified: Secondary | ICD-10-CM

## 2011-12-18 DIAGNOSIS — Z1231 Encounter for screening mammogram for malignant neoplasm of breast: Secondary | ICD-10-CM | POA: Insufficient documentation

## 2012-04-30 ENCOUNTER — Ambulatory Visit (HOSPITAL_COMMUNITY)
Admission: RE | Admit: 2012-04-30 | Discharge: 2012-04-30 | Disposition: A | Discharge status: Home / Self Care | Payer: Medicare Other | Source: Ambulatory Visit | Attending: Cardiovascular Disease | Admitting: Cardiovascular Disease

## 2012-04-30 DIAGNOSIS — I10 Essential (primary) hypertension: Secondary | ICD-10-CM | POA: Insufficient documentation

## 2012-04-30 DIAGNOSIS — R011 Cardiac murmur, unspecified: Secondary | ICD-10-CM | POA: Insufficient documentation

## 2012-04-30 DIAGNOSIS — I359 Nonrheumatic aortic valve disorder, unspecified: Secondary | ICD-10-CM | POA: Insufficient documentation

## 2012-04-30 NOTE — Progress Notes (Signed)
*  PRELIMINARY RESULTS* Echocardiogram 2D Echocardiogram has been performed.  [name] 04/30/2012, 2:32 PM

## 2012-06-12 ENCOUNTER — Other Ambulatory Visit: Payer: Self-pay | Admitting: Cardiovascular Disease

## 2012-06-13 NOTE — Telephone Encounter (Signed)
rx called to [facility]

## 2012-08-08 ENCOUNTER — Other Ambulatory Visit: Payer: Self-pay

## 2012-08-08 MED ORDER — AMIODARONE HCL 200 MG PO TABS: 100.0000 mg | ORAL_TABLET | Freq: Every day | ORAL | Status: DC

## 2012-08-08 NOTE — Telephone Encounter (Signed)
Rx was sent to pharmacy electronically via [employer].  

## 2012-08-26 ENCOUNTER — Other Ambulatory Visit: Payer: Self-pay | Admitting: *Deleted

## 2012-08-26 DIAGNOSIS — R011 Cardiac murmur, unspecified: Secondary | ICD-10-CM

## 2012-09-10 ENCOUNTER — Encounter: Payer: Self-pay | Admitting: Cardiovascular Disease

## 2012-09-30 ENCOUNTER — Telehealth: Payer: Self-pay | Admitting: Cardiovascular Disease

## 2012-09-30 NOTE — Telephone Encounter (Signed)
Pt. Instructed to stop taking losartan and to keep checking bp's and let us know if bp goes up or if she develops any symptoms

## 2012-09-30 NOTE — Telephone Encounter (Signed)
Returned call.  Pt stated "this medicine is about to kill me."  Stated the losartan potassium says it can cause dizziness and she has been having a lot of dizziness.  Stated she is taking med for dizziness and still has dizziness.  RN asked pt what BP was today and pt checked while on phone: 148/71 HR 60.  Pt wants to know if the dose needs to be changed or something else.  Pt informed Dr. Alanda Amass will be notified for further instructions.  Pt verbalized understanding and agreed w/ plan.  Message forwarded to [facility]. Berlinda Last, LPN to discuss w/ Dr. Alanda Amass.  This note and paper chart# [id number] placed on Dr. Kandis Cocking cart.  Last OV note printed as pt seen in [place] office.

## 2012-09-30 NOTE — Telephone Encounter (Signed)
Pt left message on the voicemail-she needs to ask some questions about her medicine.

## 2012-10-17 ENCOUNTER — Other Ambulatory Visit: Payer: Self-pay | Admitting: *Deleted

## 2012-10-17 MED ORDER — AMLODIPINE BESYLATE 5 MG PO TABS: 5.0000 mg | ORAL_TABLET | Freq: Every day | ORAL | Status: DC

## 2013-01-13 ENCOUNTER — Telehealth: Payer: Self-pay | Admitting: Cardiovascular Disease

## 2013-01-13 ENCOUNTER — Other Ambulatory Visit: Payer: Self-pay | Admitting: *Deleted

## 2013-01-13 MED ORDER — ATORVASTATIN CALCIUM 20 MG PO TABS: ORAL_TABLET | ORAL | Status: DC

## 2013-01-13 NOTE — Telephone Encounter (Signed)
Need refill on Atorvastatin 20mg  # 30

## 2013-01-13 NOTE — Telephone Encounter (Signed)
Rx was sent to pharmacy electronically. 

## 2013-01-14 ENCOUNTER — Telehealth: Payer: Self-pay | Admitting: Cardiovascular Disease

## 2013-01-14 MED ORDER — ATORVASTATIN CALCIUM 20 MG PO TABS: ORAL_TABLET | ORAL | Status: DC

## 2013-01-14 NOTE — Telephone Encounter (Signed)
Refills sent into pharmacy yesterday. Went ahead and discontinued yesterday's refills and reordered Atorvastatin 20mg .

## 2013-01-14 NOTE — Telephone Encounter (Signed)
Returned call.  Left message asking that hardy copy request be faxed to [phone] for refills.  

## 2013-01-14 NOTE — Telephone Encounter (Signed)
Pt called and said she need her medicine today.She said would you please call and let her know when you do this today. She need her cholesterol medicine(avastatain)

## 2013-01-14 NOTE — Telephone Encounter (Signed)
He wanted you to know that he refaxed her Atorvastatin prescription about 30 minutes ago.Pt is anxious to get her medicine.

## 2013-01-14 NOTE — Telephone Encounter (Signed)
Medical Records, please look for fax and place on refill cart.  Thanks.

## 2013-01-15 ENCOUNTER — Other Ambulatory Visit: Payer: Self-pay

## 2013-01-15 MED ORDER — ATORVASTATIN CALCIUM 20 MG PO TABS: ORAL_TABLET | ORAL | Status: DC

## 2013-01-15 NOTE — Telephone Encounter (Signed)
Rx was sent to pharmacy electronically. 

## 2013-03-26 ENCOUNTER — Ambulatory Visit (INDEPENDENT_AMBULATORY_CARE_PROVIDER_SITE_OTHER): Payer: Medicare Other | Admitting: Cardiology

## 2013-03-26 ENCOUNTER — Encounter (INDEPENDENT_AMBULATORY_CARE_PROVIDER_SITE_OTHER): Payer: Self-pay

## 2013-03-26 VITALS — BP 123/67 | HR 57 | Ht 66.0 in | Wt 130.0 lb

## 2013-03-26 DIAGNOSIS — R011 Cardiac murmur, unspecified: Secondary | ICD-10-CM | POA: Insufficient documentation

## 2013-03-26 DIAGNOSIS — I2510 Atherosclerotic heart disease of native coronary artery without angina pectoris: Secondary | ICD-10-CM | POA: Insufficient documentation

## 2013-03-26 DIAGNOSIS — I10 Essential (primary) hypertension: Secondary | ICD-10-CM | POA: Insufficient documentation

## 2013-03-26 DIAGNOSIS — E785 Hyperlipidemia, unspecified: Secondary | ICD-10-CM | POA: Insufficient documentation

## 2013-03-26 MED ORDER — HYDROCHLOROTHIAZIDE 12.5 MG PO CAPS: 12.5000 mg | ORAL_CAPSULE | Freq: Every day | ORAL | Status: DC

## 2013-03-26 MED ORDER — ASPIRIN EC 81 MG PO TBEC: 81.0000 mg | DELAYED_RELEASE_TABLET | Freq: Every day | ORAL | Status: AC

## 2013-03-26 NOTE — Progress Notes (Signed)
Clinical Summary Ms. Mardella LaymanLindsey is a 80 y.o.female former patient of Dr Alanda AmassWeintraub, this is our first visit together. She is seen for the following medical problems.  1. CAD - cath 2008 with 80% diag, 80% ostial LCX that has been treated medically per notes. LVEF at that time 60% by LV gram. - denies any chest pain. Denies any DOE, walks regularly without issues.   2. Parox afib - has been managed on amio, has had prior DCCV 2012.She had bradycardia when previously on dilt - denies any palpitations. Can have some lightheadedness with standing. - not on anticoag by her previous cardiologist.From what I can gather from the notes it seems this is because she has not had any clear recurrence of her afib since her DCCV - she stopped taking ASA as well after seeing a news story that worried her - on amio, labs 07/2012 ALT 20 AST 21 TSH 0.946  3. HTN - checks frequently, typically around 130s/60s  4. Hyperlipidemia - compliant with statin - no recent lipid panel in our system  5. Heart murmur Recent echo shows aortic sclerosis without stenosis.  Past Medical History  Diagnosis Date  . Myocardial infarct   . Hypertension   . Hypercholesteremia   . Thyroid disease   . Acid reflux      No Known Allergies   Current Outpatient Prescriptions  Medication Sig Dispense Refill  . amiodarone (PACERONE) 200 MG tablet Take 0.5 tablets (100 mg total) by mouth daily.  15 tablet  8  . amLODipine (NORVASC) 5 MG tablet Take 1 tablet (5 mg total) by mouth daily.  30 tablet  11  . aspirin EC 81 MG tablet Take 81 mg by mouth daily.        Marland Kitchen. atorvastatin (LIPITOR) 20 MG tablet Take one tablet by mouth once daily.  30 tablet  7  . guaifenesin (ROBITUSSIN) 100 MG/5ML syrup Take 100 mg by mouth 3 (three) times daily as needed. Cough Syrup       . levothyroxine (SYNTHROID, LEVOTHROID) 75 MCG tablet Take 75 mcg by mouth daily.        Marland Kitchen. losartan (COZAAR) 100 MG tablet Take 100 mg by mouth daily.          Marland Kitchen. omeprazole (PRILOSEC) 20 MG capsule Take 20 mg by mouth daily.         No current facility-administered medications for this visit.     Past Surgical History  Procedure Laterality Date  . Abdominal hysterectomy    . Thyroid surgery       No Known Allergies    No family history on file.   Social History Ms. Mardella LaymanLindsey has no tobacco history on file. Ms. Mardella LaymanLindsey reports that she does not drink alcohol.   Review of Systems CONSTITUTIONAL: No weight loss, fever, chills, weakness or fatigue.  HEENT: Eyes: No visual loss, blurred vision, double vision or yellow sclerae.No hearing loss, sneezing, congestion, runny nose or sore throat.  SKIN: No rash or itching.  CARDIOVASCULAR: per HPI RESPIRATORY: No shortness of breath, cough or sputum.  GASTROINTESTINAL: No anorexia, nausea, vomiting or diarrhea. No abdominal pain or blood.  GENITOURINARY: No burning on urination, no polyuria NEUROLOGICAL: No headache, dizziness, syncope, paralysis, ataxia, numbness or tingling in the extremities. No change in bowel or bladder control.  MUSCULOSKELETAL: No muscle, back pain, joint pain or stiffness.  LYMPHATICS: No enlarged nodes. No history of splenectomy.  PSYCHIATRIC: No history of depression or anxiety.  ENDOCRINOLOGIC: No  reports of sweating, cold or heat intolerance. No polyuria or polydipsia.  Marland Kitchen   Physical Examination p 57 bp 123/67 Wt 130 lbs BMI 21 Gen: resting comfortably, no acute distress HEENT: no scleral icterus, pupils equal round and reactive, no palptable cervical adenopathy,  CV: RRR, 2/6 systolic murmur RUSB, no JVD Resp: Clear to auscultation bilaterally GI: abdomen is soft, non-tender, non-distended, normal bowel sounds, no hepatosplenomegaly MSK: extremities are warm, no edema.  Skin: warm, no rash Neuro:  no focal deficits Psych: appropriate affect   Diagnostic Studies 04/2012 Echo Severe LVH, grade I diastolic dysfunction, mild MR. No LVEF mentioned. Aortic  sclerosis without stenosis  03/2006 Cath RESULTS:  1. Hemodynamic monitoring: Central aortic pressure was 184/80. Left  ventricular pressure was 184/22 with no significant valve gradient  at the time of pull-back.  2. Ventriculography: Ventriculography in the RAO projection using 20  mL of contrast, 12 mL per second, revealed normal LV systolic  function, mild left ventricular hypertrophy. Ejection fraction  excess of 60% and the end-diastolic pressure was 25.  3. Distal aortogram: Distal aortogram done above the level of renal  artery showed no evidence of renal artery stenosis and no abdominal  aortic aneurysm.  CORONARY ARTERIOGRAPHY: On fluoroscopy, faint calcification was seen in  the distribution of the left main, proximal LAD and proximal right  coronary artery.  1. Left main normal. It trifurcated.  2. Circumflex. There was ostial 80% narrowing of the circumflex as it  came off the left main. The mid and distal vessel were free of  disease.  3. Ramus intermediate (optional diagonal) moderate size and normal.  4. Left anterior descending. The LAD extended down to the apex of the  heart. The distal LAD was free of disease. The mid portion of the  LAD had an area of 30-40% narrowing at the takeoff of first  diagonal. The first diagonal had an 85% area of ostial narrowing.  5. Right coronary artery. The right coronary artery gave rise to the  PDA. This vessel was free of disease. Several injections of the  cusp did not show any evidence of ostial narrowing.  CONCLUSION:  1. High-grade ostial narrowing of the circumflex at the left main.  2. Ostial narrowing of the first diagonal at the take-off of the LAD.  3. Left ventricular hypertrophy.  4. Normal LV systolic function.  It appears this lady will need bypass surgery because of the ostial  circumflex lesion. I plan to ask my colleagues for their opinion before  we refer her to CVTS. She will be restarted on IV heparin in about  3  hours following her cath. I have temporarily stopped her Coumadin.    Assessment and Plan  1. CAD - no current symptoms - continue risk factor modification, restart ASA  2. Parox afib - no current symptoms - from old notes appears she has not had any recurrence, I suspect that's why she has not been on anticoag - will restart ASA, if recurrence of symptoms reevalute for afib with monitor  3. HTN - at goal, continue current meds  4. Hyperlipidemia - continue current statin - check lipids at next visit if not done by pcp  5.Heart murmur - no significant pathology on echo    Antoine Poche, M.D., F.A.C.C.

## 2013-03-26 NOTE — Patient Instructions (Addendum)
Your physician recommends that you schedule a follow-up appointment in: 4 months. You will receive a reminder letter in the mail in about 1-2 months reminding you to call and schedule your appointment. If you don't receive this letter, please contact our office. Your physician has recommended you make the following change in your medication:   Stop hydrochlorothiazide. Start aspirin 81 mg daily. Your new prescription have been sent to your pharmacy. All other medications will remain the same.

## 2013-07-28 ENCOUNTER — Encounter: Payer: Self-pay | Admitting: Cardiology

## 2013-07-28 ENCOUNTER — Ambulatory Visit (INDEPENDENT_AMBULATORY_CARE_PROVIDER_SITE_OTHER): Payer: Medicare Other | Admitting: Cardiology

## 2013-07-28 VITALS — BP 140/78 | HR 60 | Wt 126.0 lb

## 2013-07-28 DIAGNOSIS — E785 Hyperlipidemia, unspecified: Secondary | ICD-10-CM

## 2013-07-28 DIAGNOSIS — I2510 Atherosclerotic heart disease of native coronary artery without angina pectoris: Secondary | ICD-10-CM

## 2013-07-28 DIAGNOSIS — I10 Essential (primary) hypertension: Secondary | ICD-10-CM

## 2013-07-28 MED ORDER — FUROSEMIDE 20 MG PO TABS
ORAL_TABLET | ORAL | Status: DC
Start: 2013-07-28 — End: 2015-12-22

## 2013-07-28 NOTE — Patient Instructions (Signed)
Your physician recommends that you schedule a follow-up appointment in: 6 months with Dr Lurena JoinerBranch You will receive a reminder letter two months in advance reminding you to call and schedule your appointment. If you don't receive this letter, please contact our office.  Your physician has recommended you make the following change in your medication:  Take Lasix 20 mg everyday as needed for swelling.

## 2013-07-28 NOTE — Progress Notes (Signed)
Clinical Summary [name] is a 80 y.o.female seen today for follow up of the following medical problems.  1. CAD  - cath 2008 with 80% diag, 80% ostial LCX that has been treated medically per notes. LVEF at that time 60% by LV gram.  - denies any chest pain. Denies any DOE, walks regularly without issues.   2. Parox afib  - has been managed on amio, has had prior DCCV 2012.She had bradycardia when previously on dilt  - denies any palpitations. Can have some lightheadedness with standing.  - not on anticoag by her previous cardiologist.From what I can gather from the notes it seems this is because she has not had any clear recurrence of her afib since her DCCV  - on amio, labs 07/2012 ALT 20 AST 21 TSH 0.946. Reports recent labs by pcp  3. HTN  - checks frequently, typically around 130s/60s   4. Hyperlipidemia  - compliant with statin   5. LE swelling - Bilateral. Noted episode approx 3 weeks ago, now resolved.     Past Medical History  Diagnosis Date  . Myocardial infarct   . Hypertension   . Hypercholesteremia   . Thyroid disease   . Acid reflux      No Known Allergies   Current Outpatient Prescriptions  Medication Sig Dispense Refill  . amiodarone (PACERONE) 200 MG tablet Take 0.5 tablets (100 mg total) by mouth daily.  15 tablet  8  . amLODipine (NORVASC) 5 MG tablet Take 1 tablet (5 mg total) by mouth daily.  30 tablet  11  . aspirin EC 81 MG tablet Take 1 tablet (81 mg total) by mouth daily.  90 tablet  3  . atorvastatin (LIPITOR) 20 MG tablet Take one tablet by mouth once daily.  30 tablet  7  . guaifenesin (ROBITUSSIN) 100 MG/5ML syrup Take 100 mg by mouth 3 (three) times daily as needed. Cough Syrup       . levothyroxine (SYNTHROID, LEVOTHROID) 75 MCG tablet Take 75 mcg by mouth daily.        Marland Kitchen. omeprazole (PRILOSEC) 20 MG capsule Take 20 mg by mouth daily.         No current facility-administered medications for this visit.     Past Surgical  History  Procedure Laterality Date  . Abdominal hysterectomy    . Thyroid surgery       No Known Allergies    No family history on file.   Social History [name] has no tobacco history on file. [name] reports that she does not drink alcohol.   Review of Systems CONSTITUTIONAL: No weight loss, fever, chills, weakness or fatigue.  HEENT: Eyes: No visual loss, blurred vision, double vision or yellow sclerae.No hearing loss, sneezing, congestion, runny nose or sore throat.  SKIN: No rash or itching.  CARDIOVASCULAR: per HPI RESPIRATORY: No shortness of breath, cough or sputum.  GASTROINTESTINAL: No anorexia, nausea, vomiting or diarrhea. No abdominal pain or blood.  GENITOURINARY: No burning on urination, no polyuria NEUROLOGICAL: No headache, dizziness, syncope, paralysis, ataxia, numbness or tingling in the extremities. No change in bowel or bladder control.  MUSCULOSKELETAL: No muscle, back pain, joint pain or stiffness.  LYMPHATICS: No enlarged nodes. No history of splenectomy.  PSYCHIATRIC: No history of depression or anxiety.  ENDOCRINOLOGIC: No reports of sweating, cold or heat intolerance. No polyuria or polydipsia.  Marland Kitchen.   Physical Examination p 60 bp 140/78 Wt 126 lbs Gen: resting comfortably, no acute  distress HEENT: no scleral icterus, pupils equal round and reactive, no palptable cervical adenopathy,  CV: RRR, 2/6 systolic murmur, no JVD, no carotid bruits Resp: Clear to auscultation bilaterally GI: abdomen is soft, non-tender, non-distended, normal bowel sounds, no hepatosplenomegaly MSK: extremities are warm, no edema.  Skin: warm, no rash Neuro:  no focal deficits Psych: appropriate affect   Diagnostic Studies 04/2012 Echo  Severe LVH, grade I diastolic dysfunction, mild MR. No LVEF mentioned. Aortic sclerosis without stenosis   03/2006 Cath  RESULTS:  1. Hemodynamic monitoring: Central aortic pressure was 184/80. Left  ventricular pressure was  184/22 with no significant valve gradient  at the time of pull-back.  2. Ventriculography: Ventriculography in the RAO projection using 20  mL of contrast, 12 mL per second, revealed normal LV systolic  function, mild left ventricular hypertrophy. Ejection fraction  excess of 60% and the end-diastolic pressure was 25.  3. Distal aortogram: Distal aortogram done above the level of renal  artery showed no evidence of renal artery stenosis and no abdominal  aortic aneurysm.  CORONARY ARTERIOGRAPHY: On fluoroscopy, faint calcification was seen in  the distribution of the left main, proximal LAD and proximal right  coronary artery.  1. Left main normal. It trifurcated.  2. Circumflex. There was ostial 80% narrowing of the circumflex as it  came off the left main. The mid and distal vessel were free of  disease.  3. Ramus intermediate (optional diagonal) moderate size and normal.  4. Left anterior descending. The LAD extended down to the apex of the  heart. The distal LAD was free of disease. The mid portion of the  LAD had an area of 30-40% narrowing at the takeoff of first  diagonal. The first diagonal had an 85% area of ostial narrowing.  5. Right coronary artery. The right coronary artery gave rise to the  PDA. This vessel was free of disease. Several injections of the  cusp did not show any evidence of ostial narrowing.  CONCLUSION:  1. High-grade ostial narrowing of the circumflex at the left main.  2. Ostial narrowing of the first diagonal at the take-off of the LAD.  3. Left ventricular hypertrophy.  4. Normal LV systolic function.  It appears this lady will need bypass surgery because of the ostial  circumflex lesion. I plan to ask my colleagues for their opinion before  we refer her to CVTS. She will be restarted on IV heparin in about 3  hours following her cath. I have temporarily stopped her Coumadin.        Assessment and Plan  1. CAD  - no current symptoms  -  continue risk factor modification, restart ASA   2. Parox afib  - no current symptoms  - from old notes appears she has not had any recurrence, I suspect that's why she has not been on anticoag  - continue ASA  3. HTN  - at goal, continue current meds   4. Hyperlipidemia  - continue current statin  - f/u recent pcp from panel  5.Heart murmur  - no significant pathology on echo  6. Ankle swelling - noted 3 weeks ago on both sides, resolved on its own - likely related to mild underlying diastolic dysfunction. Given prn Rx for lasix, f/u recent pcp labs   F/u 6 months     Antoine Poche, M.D., F.A.C.C.

## 2014-02-11 ENCOUNTER — Encounter: Payer: Self-pay | Admitting: Cardiology

## 2014-02-11 ENCOUNTER — Ambulatory Visit (INDEPENDENT_AMBULATORY_CARE_PROVIDER_SITE_OTHER): Payer: Medicare Other | Admitting: Cardiology

## 2014-02-11 VITALS — BP 128/58 | HR 65 | Ht 65.0 in | Wt 125.8 lb

## 2014-02-11 DIAGNOSIS — E785 Hyperlipidemia, unspecified: Secondary | ICD-10-CM

## 2014-02-11 DIAGNOSIS — I2510 Atherosclerotic heart disease of native coronary artery without angina pectoris: Secondary | ICD-10-CM

## 2014-02-11 DIAGNOSIS — I10 Essential (primary) hypertension: Secondary | ICD-10-CM

## 2014-02-11 DIAGNOSIS — R002 Palpitations: Secondary | ICD-10-CM

## 2014-02-11 NOTE — Patient Instructions (Signed)
Your physician recommends that you schedule a follow-up appointment in: 2 weeks with [name] NP or Dr [name]   Your physician has recommended that you wear an event monitor for 7 days.Please mailed back by [employer] on 02/19/14. Event monitors are medical devices that record the heart's electrical activity. Doctors most often us these monitors to diagnose arrhythmias. Arrhythmias are problems with the speed or rhythm of the heartbeat. The monitor is a small, portable device. You can wear one while you do your normal daily activities. This is usually used to diagnose what is causing palpitations/syncope (passing out).   Your physician recommends that you continue on your current medications as directed. Please refer to the Current Medication list given to you today.     Thank you for choosing [facility] !

## 2014-02-11 NOTE — Progress Notes (Signed)
Clinical Summary Ms. Hibbitts is a 81 y.o.female seen today for follow up of the following medical problems.   1. CAD  - cath 2008 with 80% diag, 80% ostial LCX that has been treated medically per notes. LVEF at that time 60% by LV gram.  - denies any chest pain.   2. Parox afib  - has been managed on amio, has had prior DCCV 2012.She had bradycardia when previously on dilt  - denies any palpitations. Can have some lightheadedness with standing.  - not on anticoag by her previous cardiologist.From what I can gather from the notes it seems this is because she has not had any clear recurrence of her afib since her DCCV   - reports episodes of SOB and feeling of heart skipping over the last few months. Can feel fatigues, lightheaded, dizzy.   3. HTN  - checks frequently, typically around 130s/60s   4. Hyperlipidemia  - compliant with statin     Past Medical History  Diagnosis Date  . Myocardial infarct   . Hypertension   . Hypercholesteremia   . Thyroid disease   . Acid reflux      No Known Allergies   Current Outpatient Prescriptions  Medication Sig Dispense Refill  . amiodarone (PACERONE) 200 MG tablet Take 0.5 tablets (100 mg total) by mouth daily. 15 tablet 8  . amLODipine (NORVASC) 2.5 MG tablet Take 2.5 mg by mouth daily.    Marland Kitchen aspirin EC 81 MG tablet Take 1 tablet (81 mg total) by mouth daily. 90 tablet 3  . atorvastatin (LIPITOR) 20 MG tablet Take one tablet by mouth once daily. 30 tablet 7  . furosemide (LASIX) 20 MG tablet Take 1 tablet everyday as needed for swelling. 30 tablet 3  . levothyroxine (SYNTHROID, LEVOTHROID) 75 MCG tablet Take 75 mcg by mouth daily.      Marland Kitchen omeprazole (PRILOSEC) 20 MG capsule Take 20 mg by mouth 2 (two) times daily before a meal.      No current facility-administered medications for this visit.     Past Surgical History  Procedure Laterality Date  . Abdominal hysterectomy    . Thyroid surgery       No Known  Allergies    No family history on file.   Social History Ms. Besser reports that she has never smoked. She does not have any smokeless tobacco history on file. Ms. Garbett reports that she does not drink alcohol.   Review of Systems CONSTITUTIONAL: occasional fatigue HEENT: Eyes: No visual loss, blurred vision, double vision or yellow sclerae.No hearing loss, sneezing, congestion, runny nose or sore throat.  SKIN: No rash or itching.  CARDIOVASCULAR: per HPI RESPIRATORY: No shortness of breath, cough or sputum.  GASTROINTESTINAL: No anorexia, nausea, vomiting or diarrhea. No abdominal pain or blood.  GENITOURINARY: No burning on urination, no polyuria NEUROLOGICAL:occas lightheadness/dizziness MUSCULOSKELETAL: No muscle, back pain, joint pain or stiffness.  LYMPHATICS: No enlarged nodes. No history of splenectomy.  PSYCHIATRIC: No history of depression or anxiety.  ENDOCRINOLOGIC: No reports of sweating, cold or heat intolerance. No polyuria or polydipsia.  Marland Kitchen   Physical Examination p 65 bp 128/58 Wt 125 lbs BMI 21 Gen: resting comfortably, no acute distress HEENT: no scleral icterus, pupils equal round and reactive, no palptable cervical adenopathy,  CV: RRR, no m/r/g, no JVD, no carotid bruits Resp: Clear to auscultation bilaterally GI: abdomen is soft, non-tender, non-distended, normal bowel sounds, no hepatosplenomegaly MSK: extremities are warm, no edema.  Skin: warm, no rash Neuro:  no focal deficits Psych: appropriate affect   Diagnostic Studies  04/2012 Echo  Severe LVH, grade I diastolic dysfunction, mild MR. No LVEF mentioned. Aortic sclerosis without stenosis   03/2006 Cath  RESULTS:  1. Hemodynamic monitoring: Central aortic pressure was 184/80. Left  ventricular pressure was 184/22 with no significant valve gradient  at the time of pull-back.  2. Ventriculography: Ventriculography in the RAO projection using 20  mL of contrast, 12 mL per second,  revealed normal LV systolic  function, mild left ventricular hypertrophy. Ejection fraction  excess of 60% and the end-diastolic pressure was 25.  3. Distal aortogram: Distal aortogram done above the level of renal  artery showed no evidence of renal artery stenosis and no abdominal  aortic aneurysm.  CORONARY ARTERIOGRAPHY: On fluoroscopy, faint calcification was seen in  the distribution of the left main, proximal LAD and proximal right  coronary artery.  1. Left main normal. It trifurcated.  2. Circumflex. There was ostial 80% narrowing of the circumflex as it  came off the left main. The mid and distal vessel were free of  disease.  3. Ramus intermediate (optional diagonal) moderate size and normal.  4. Left anterior descending. The LAD extended down to the apex of the  heart. The distal LAD was free of disease. The mid portion of the  LAD had an area of 30-40% narrowing at the takeoff of first  diagonal. The first diagonal had an 85% area of ostial narrowing.  5. Right coronary artery. The right coronary artery gave rise to the  PDA. This vessel was free of disease. Several injections of the  cusp did not show any evidence of ostial narrowing.  CONCLUSION:  1. High-grade ostial narrowing of the circumflex at the left main.  2. Ostial narrowing of the first diagonal at the take-off of the LAD.  3. Left ventricular hypertrophy.  4. Normal LV systolic function.  It appears this lady will need bypass surgery because of the ostial  circumflex lesion. I plan to ask my colleagues for their opinion before  we refer her to CVTS. She will be restarted on IV heparin in about 3  hours following her cath. I have temporarily stopped her Coumadin.       Assessment and Plan   1. CAD  - no current chest pain  - continue risk factor modification  2. Parox afib  - episodes she describes sound like symptomatic arrhythmia. EKG normal in clinic, will obtain 7  day montior.   3. HTN  - at goal, continue current meds   4. Hyperlipidemia  - continue current statin     F/u 2 weeks  Antoine PocheJonathan F. Matvey Llanas, M.D.

## 2014-02-27 ENCOUNTER — Ambulatory Visit (INDEPENDENT_AMBULATORY_CARE_PROVIDER_SITE_OTHER): Payer: Medicare Other | Admitting: Adult Health

## 2014-02-27 ENCOUNTER — Encounter: Payer: Self-pay | Admitting: Adult Health

## 2014-02-27 VITALS — BP 126/68 | HR 60 | Ht 67.0 in | Wt 123.0 lb

## 2014-02-27 DIAGNOSIS — I10 Essential (primary) hypertension: Secondary | ICD-10-CM

## 2014-02-27 DIAGNOSIS — I480 Paroxysmal atrial fibrillation: Secondary | ICD-10-CM

## 2014-02-27 NOTE — Progress Notes (Signed)
Cardiology Office Note   Date:  02/27/2014   ID:  Kimley, Apsey Jan 07, 1920, MRN [id number]  PCP:  Colette Ribas, MD  Cardiologist:  Arlington Calix, NP   Chief Complaint  Patient presents with  . Coronary Artery Disease  . Atrial Fibrillation    Paroxysmal  . Hypertension      History of Present Illness: [name] is a 81 y.o. female who presents for ongoing assessment and management of coronary artery disease, most recent cardiac catheterization 2000 it was an 80% diagonal 80% ostial left circumflex treated medically. Paroxysmal atrial fibrillation, not on anticoagulation, as she did not have recurrence after a cardioversion, hypertension, and hyperlipidemia. The patient was last seen by Dr. Wyline Mood on 02/11/2014, she was complaining of recent and increase palpitations. A 7 day. Cardiac monitor was placed on the patient. She is here for followup to discuss results. Full report is not available, day one revealed normal sinus rhythm, average rate between 50 and 110 beats per minute.  She comes today with continued complaints of fatigue, dizziness, and did not wear of the cardiac monitor for the full 7 days.  Her son is with her states that she CAD and drinking well, including nutrition supplements, using Ensure and boost.  Blood pressure has been low at home.  She denies frequent palpitations.  She denies chest pain.  Past Medical History  Diagnosis Date  . Myocardial infarct   . Hypertension   . Hypercholesteremia   . Thyroid disease   . Acid reflux     Past Surgical History  Procedure Laterality Date  . Abdominal hysterectomy    . Thyroid surgery       Current Outpatient Prescriptions  Medication Sig Dispense Refill  . amiodarone (PACERONE) 200 MG tablet Take 0.5 tablets (100 mg total) by mouth daily. 15 tablet 8  . amLODipine (NORVASC) 2.5 MG tablet Take 2.5 mg by mouth daily.    Marland Kitchen aspirin EC 81 MG tablet Take 1 tablet (81 mg total) by mouth daily.  90 tablet 3  . atorvastatin (LIPITOR) 20 MG tablet Take one tablet by mouth once daily. 30 tablet 7  . furosemide (LASIX) 20 MG tablet Take 1 tablet everyday as needed for swelling. 30 tablet 3  . levothyroxine (SYNTHROID, LEVOTHROID) 75 MCG tablet Take 75 mcg by mouth daily.      Marland Kitchen omeprazole (PRILOSEC) 20 MG capsule Take 20 mg by mouth 2 (two) times daily before a meal.      No current facility-administered medications for this visit.    Allergies:   Review of patient's allergies indicates no known allergies.    Social History:  The patient  reports that she has never smoked. She has never used smokeless tobacco. She reports that she does not drink alcohol or use illicit drugs.   ROS: .   All other systems are reviewed and negative.Unless otherwise mentioned in  H&P above.   PHYSICAL EXAM: VS:  BP 126/68 mmHg  Pulse 60  Ht  (1.702 m)  Wt 55.792 kg (123 lb)  BMI 19.26 kg/m2  SpO2 99% , BMI Body mass index is 19.26 kg/(m^2). GEN: Well nourished, well developed, in no acute distress HEENT: normal Neck: no JVD, carotid bruits, or masses Cardiac: RRR; no murmurs, rubs, or gallops,no edema  Respiratory:  clear to auscultation bilaterally, normal work of breathing GI: soft, nontender, nondistended, + BS MS: no deformity or atrophy Skin: warm and dry, no rash Neuro:  Strength  and sensation are intact Psych: euthymic mood, full affect    Recent Labs: No results found for requested labs within last 365 days.    Lipid Panel No results found for: CHOL, TRIG, HDL, CHOLHDL, VLDL, LDLCALC, LDLDIRECT    Wt Readings from Last 3 Encounters:  02/27/14 55.792 kg (123 lb)  02/11/14 57.063 kg (125 lb 12.8 oz)  07/28/13 57.153 kg (126 lb)     ASSESSMENT AND PLAN:  1. Hypertension: Blood pressure is low normal, maybe too low for someone her age.  I have asked her to hold the amlodipine 2.5 mg daily for a few days to see if her blood pressure goes up into the 140 range systolic.   As she is 81 years old.  She may require a higher blood pressure to avoid symptoms of fatigue and dizziness.  Her son said she is usually very active.  She would like to get back to her normal energy level.  She has a blood pressure machine at home.  Her son, who is very attentive also helps her.  I have advised her to take her blood pressure every day and write it down on a recording sheet I have provided. Blood pressure ranged to be in the 135-145 range.  I advised her if her blood pressure rises above 155 systolic, and 90 diastolic, she will need to go back on the 2.5 mg of amlodipine.  I will see her back in a couple of weeks, but they will call us in 3 days to let us know how her blood pressure is doing off of amlodipine.  2. History of CAD: Last cardiac catheterization in 2008 with an 80% diagonal, 80% ostial circumflex, treated medically.  Normal LVEF, without complaints of chest pain.  3. History of paroxysmal atrial fibrillation: The patient has been managed on amiodarone.  She has not had any recurrence of atrial fibrillation, documented.  She was told to wear the cardiac monitor for 7 days, which she did, but apparently it did not transmit as it should have.  She does not wish to wear it again.  If her blood pressure increases significantly, we may need to give her a break from the amiodarone.  She will followup with us in a couple of weeks to make further decisions on this.    Disposition:   FU with me in 2 weeks. Call us on Tuesday for report of BP.   Signed, Joni ReiningKathryn Makari Portman, NP  02/27/2014 1:35 PM    [facility] [address], [place], [place] [address] Phone: [phone]; Fax: [phone]

## 2014-02-27 NOTE — Progress Notes (Deleted)
Name: [name]    DOB: 1920-02-11  Age: 81 y.o.  MR#: [id number]       PCP:  Purvis Kilts, MD      Insurance: Payor: BLUE CROSS BLUE SHIELD MEDICARE / Plan: BCBS MEDICARE / Product Type: *No Product type* /   CC:    Chief Complaint  Patient presents with  . Coronary Artery Disease  . Atrial Fibrillation    Paroxysmal  . Hypertension    VS Filed Vitals:   02/27/14 1311  BP: 126/68  Pulse: 60  Height: $Remove'5\' 7"'jDGHGra$  (1.702 m)  Weight: 123 lb (55.792 kg)  SpO2: 99%    Weights Current Weight  02/27/14 123 lb (55.792 kg)  02/11/14 125 lb 12.8 oz (57.063 kg)  07/28/13 126 lb (57.153 kg)    Blood Pressure  BP Readings from Last 3 Encounters:  02/27/14 126/68  02/11/14 128/58  07/28/13 140/78     Admit date:  (Not on file) Last encounter with RMR:  Visit date not found   Allergy Review of patient's allergies indicates no known allergies.  Current Outpatient Prescriptions  Medication Sig Dispense Refill  . amiodarone (PACERONE) 200 MG tablet Take 0.5 tablets (100 mg total) by mouth daily. 15 tablet 8  . amLODipine (NORVASC) 2.5 MG tablet Take 2.5 mg by mouth daily.    Marland Kitchen aspirin EC 81 MG tablet Take 1 tablet (81 mg total) by mouth daily. 90 tablet 3  . atorvastatin (LIPITOR) 20 MG tablet Take one tablet by mouth once daily. 30 tablet 7  . furosemide (LASIX) 20 MG tablet Take 1 tablet everyday as needed for swelling. 30 tablet 3  . levothyroxine (SYNTHROID, LEVOTHROID) 75 MCG tablet Take 75 mcg by mouth daily.      Marland Kitchen omeprazole (PRILOSEC) 20 MG capsule Take 20 mg by mouth 2 (two) times daily before a meal.      No current facility-administered medications for this visit.    Discontinued Meds:   There are no discontinued medications.  Patient Active Problem List   Diagnosis Date Noted  . CAD (coronary artery disease) 03/26/2013  . HTN (hypertension) 03/26/2013  . Hyperlipidemia 03/26/2013  . Heart murmur 03/26/2013    LABS    Component Value Date/Time   NA  141 11/24/2010 0929   NA 142 05/15/2009 0540   NA 143 05/14/2009 0529   K 3.7 11/24/2010 0929   K 4.5 05/15/2009 0540   K 4.2 05/14/2009 0529   CL 104 11/24/2010 0929   CL 108 05/15/2009 0540   CL 112 05/14/2009 0529   CO2 26 11/24/2010 0929   CO2 26 05/15/2009 0540   CO2 27 05/14/2009 0529   GLUCOSE 91 11/24/2010 0929   GLUCOSE 100* 05/15/2009 0540   GLUCOSE 92 05/14/2009 0529   BUN 20 11/24/2010 0929   BUN 24* 05/15/2009 0540   BUN 19 05/14/2009 0529   CREATININE 0.99 11/24/2010 0929   CREATININE 1.34* 05/15/2009 0540   CREATININE 1.29* 05/14/2009 0529   CALCIUM 9.9 11/24/2010 0929   CALCIUM 9.5 05/15/2009 0540   CALCIUM 9.4 05/14/2009 0529   GFRNONAA 49* 11/24/2010 0929   GFRNONAA 37* 05/15/2009 0540   GFRNONAA 39* 05/14/2009 0529   GFRAA 57* 11/24/2010 0929   GFRAA * 05/15/2009 0540    45        The eGFR has been calculated using the MDRD equation. This calculation has not been validated in all clinical situations. eGFR's persistently <60 mL/min signify possible Chronic Kidney Disease.  GFRAA * 05/14/2009 0529    47        The eGFR has been calculated using the MDRD equation. This calculation has not been validated in all clinical situations. eGFR's persistently <60 mL/min signify possible Chronic Kidney Disease.   CMP     Component Value Date/Time   NA 141 11/24/2010 0929   K 3.7 11/24/2010 0929   CL 104 11/24/2010 0929   CO2 26 11/24/2010 0929   GLUCOSE 91 11/24/2010 0929   BUN 20 11/24/2010 0929   CREATININE 0.99 11/24/2010 0929   CALCIUM 9.9 11/24/2010 0929   PROT 6.9 05/10/2009 1412   ALBUMIN 3.5 05/10/2009 1412   AST 25 05/10/2009 1412   ALT 20 05/10/2009 1412   ALKPHOS 69 05/10/2009 1412   BILITOT 0.7 05/10/2009 1412   GFRNONAA 49* 11/24/2010 0929   GFRAA 57* 11/24/2010 0929       Component Value Date/Time   WBC 4.9 11/24/2010 0929   WBC 4.3 05/15/2009 0540   WBC 4.6 05/14/2009 0529   HGB 15.4* 11/24/2010 0929   HGB 11.8*  05/15/2009 0540   HGB 12.2 05/14/2009 0529   HCT 44.6 11/24/2010 0929   HCT 34.0* 05/15/2009 0540   HCT 35.2* 05/14/2009 0529   MCV 91.8 11/24/2010 0929   MCV 94.4 05/15/2009 0540   MCV 95.2 05/14/2009 0529    Lipid Panel  No results found for: CHOL, TRIG, HDL, CHOLHDL, VLDL, LDLCALC, LDLDIRECT  ABG No results found for: PHART, PCO2ART, PO2ART, HCO3, TCO2, ACIDBASEDEF, O2SAT   Lab Results  Component Value Date   TSH 0.150 ***Test methodology is 3rd generation TSH**** 05/10/2009   BNP (last 3 results) No results for input(s): BNP in the last 8760 hours.  ProBNP (last 3 results) No results for input(s): PROBNP in the last 8760 hours.  Cardiac Panel (last 3 results) No results for input(s): CKTOTAL, CKMB, TROPONINI, RELINDX in the last 72 hours.  Iron/TIBC/Ferritin/ %Sat No results found for: IRON, TIBC, FERRITIN, IRONPCTSAT   EKG Orders placed or performed in visit on 02/11/14  . EKG 12-Lead     Prior Assessment and Plan Problem List as of 02/27/2014      Cardiovascular and Mediastinum   CAD (coronary artery disease)   HTN (hypertension)     Other   Hyperlipidemia   Heart murmur       Imaging: No results found.

## 2014-02-27 NOTE — Patient Instructions (Signed)
Your physician recommends that you schedule a follow-up appointment in: 2 weeks with [name] NP    STOP Amlodipine for 3 days and call us on Tuesday and report your daily BP readings.     Thank you for choosing [facility] !

## 2014-03-03 ENCOUNTER — Telehealth: Payer: Self-pay | Admitting: *Deleted

## 2014-03-03 NOTE — Telephone Encounter (Signed)
BP Friday 126/68 Sat 157/73 Sun 173/89  mond 179/93 tue 156/76

## 2014-03-03 NOTE — Telephone Encounter (Signed)
Noted! Thank you

## 2014-03-03 NOTE — Telephone Encounter (Signed)
Pt notified to continue as planned

## 2014-03-03 NOTE — Telephone Encounter (Signed)
Will forward to provider  

## 2014-03-12 NOTE — Progress Notes (Deleted)
, Cardiology Office Note   Date:  03/13/2014   ID:  Maria IvoryMable M Dowson, DOB 08/30/1920, MRN [id number]  PCP:  Colette RibasGOLDING, JOHN CABOT, MD  Cardiologist:  Arlington CalixBranch/ Merrilee Ancona, NP   Chief Complaint  Patient presents with  . Coronary Artery Disease  . Atrial Fibrillation  . Hypertension      History of Present Illness: [name] is a 81 y.o. female who presents for is a , and going assessment and management of CAD, most recent cath in 2008, with an 80% diagonal, 80% ostial left circumflex, treated medically.  Paroxysmal atrial fibrillation, hypertension, hyperlipidemia.  She was last seen in the office by myself on 02/27/2014.  At that time.  She continued to have complaints of fatigue, dizziness, and did not with cardiac monitor for falls, and days, as required.  Her blood pressure is unavailable.  Normal.  We have asked her to stop taking amlodipine 2.5 mg for a few days to see if her blood pressure improved.  She was to record her blood pressures at home.  I have given her a blood pressure ranging-35-145, but advised her if her blood pressure remains above 155 to 160s.  She is to call is.  She was continued on amiodarone.  This may be discontinued if she is unable to tolerate this medication and remains hypotensive.  She is also on torsemide to use when necessary.  She is here for followup    Past Medical History  Diagnosis Date  . Myocardial infarct   . Hypertension   . Hypercholesteremia   . Thyroid disease   . Acid reflux     Past Surgical History  Procedure Laterality Date  . Abdominal hysterectomy    . Thyroid surgery       Current Outpatient Prescriptions  Medication Sig Dispense Refill  . amiodarone (PACERONE) 200 MG tablet Take 0.5 tablets (100 mg total) by mouth daily. 15 tablet 8  . aspirin EC 81 MG tablet Take 1 tablet (81 mg total) by mouth daily. 90 tablet 3  . atorvastatin (LIPITOR) 20 MG tablet Take one tablet by mouth once daily. 30 tablet 7  .  furosemide (LASIX) 20 MG tablet Take 1 tablet everyday as needed for swelling. 30 tablet 3  . levothyroxine (SYNTHROID, LEVOTHROID) 75 MCG tablet Take 75 mcg by mouth daily.      Marland Kitchen. omeprazole (PRILOSEC) 20 MG capsule Take 20 mg by mouth 2 (two) times daily before a meal.      No current facility-administered medications for this visit.    Allergies:   Review of patient's allergies indicates no known allergies.    Social History:  The patient  reports that she has never smoked. She has never used smokeless tobacco. She reports that she does not drink alcohol or use illicit drugs.   Family History:  The patient's family history is not on file.    ROS: .   All other systems are reviewed and negative.Unless otherwise mentioned in  H&P above.   PHYSICAL EXAM: VS:  BP 180/82 mmHg  Pulse 59  Ht 5\' 5"  (1.651 m)  Wt 122 lb (55.339 kg)  BMI 20.30 kg/m2  SpO2 97% , BMI Body mass index is 20.3 kg/(m^2). GEN: Well nourished, well developed, in no acute distress HEENT: normal Neck: no JVD, carotid bruits, or masses Cardiac: ***RRR; no murmurs, rubs, or gallops,no edema  Respiratory:  clear to auscultation bilaterally, normal work of breathing GI: soft, nontender, nondistended, + BS MS: no  deformity or atrophy Skin: warm and dry, no rash Neuro:  Strength and sensation are intact Psych: euthymic mood, full affect   EKG:  EKG {ACTION; IS/IS [id number]} ordered today. The ekg ordered today demonstrates ***   Recent Labs: No results found for requested labs within last 365 days.    Lipid Panel No results found for: CHOL, TRIG, HDL, CHOLHDL, VLDL, LDLCALC, LDLDIRECT    Wt Readings from Last 3 Encounters:  03/13/14 122 lb (55.339 kg)  02/27/14 123 lb (55.792 kg)  02/11/14 125 lb 12.8 oz (57.063 kg)      Other studies Reviewed: Additional studies/ records that were reviewed today include: ***. Review of the above records demonstrates: ***   ASSESSMENT AND PLAN:  1.   ***   Current medicines are reviewed at length with the patient today.    Labs/ tests ordered today include: *** No orders of the defined types were placed in this encounter.     Disposition:   FU with *** in {gen number [id number]} {TIME; UNITS DAY/WEEK/MONTH:19136}   Signed, Joni Reining, NP  03/13/2014 1:22 PM    [facility] [address] [place], [place] [address] Phone: [phone]; Fax: [phone]

## 2014-03-13 ENCOUNTER — Encounter: Payer: Self-pay | Admitting: Adult Health

## 2014-03-13 ENCOUNTER — Ambulatory Visit (INDEPENDENT_AMBULATORY_CARE_PROVIDER_SITE_OTHER): Payer: Medicare Other | Admitting: Adult Health

## 2014-03-13 VITALS — BP 180/82 | HR 59 | Ht 65.0 in | Wt 122.0 lb

## 2014-03-13 DIAGNOSIS — E7800 Pure hypercholesterolemia, unspecified: Secondary | ICD-10-CM

## 2014-03-13 DIAGNOSIS — I10 Essential (primary) hypertension: Secondary | ICD-10-CM

## 2014-03-13 NOTE — Progress Notes (Signed)
Cardiology Office Note   Date:  03/13/2014   ID:  Maria, Mayo 12-01-1920, MRN [id number]  PCP:  Colette Ribas, MD  Cardiologist:  Arlington Calix, NP   Chief Complaint  Patient presents with  . Coronary Artery Disease  . Atrial Fibrillation  . Hypertension      History of Present Illness: [name] is a 81 y.o. female who presents for on going assessment and management of hypertension, Atrial fib, with hx of generalized weakness and fatigue. On last visit she was taken off of amlodipine to ascertain if this was causing her symptoms as her BP was low normal BP records were sent home with her.  BP has been going up into the 170's and 180's systolic. Since stopping the amlodipine. She states she feels worse. She also complains of leg fatigue.     Past Medical History  Diagnosis Date  . Myocardial infarct   . Hypertension   . Hypercholesteremia   . Thyroid disease   . Acid reflux     Past Surgical History  Procedure Laterality Date  . Abdominal hysterectomy    . Thyroid surgery       Current Outpatient Prescriptions  Medication Sig Dispense Refill  . amiodarone (PACERONE) 200 MG tablet Take 0.5 tablets (100 mg total) by mouth daily. 15 tablet 8  . aspirin EC 81 MG tablet Take 1 tablet (81 mg total) by mouth daily. 90 tablet 3  . atorvastatin (LIPITOR) 20 MG tablet Take one tablet by mouth once daily. 30 tablet 7  . furosemide (LASIX) 20 MG tablet Take 1 tablet everyday as needed for swelling. 30 tablet 3  . levothyroxine (SYNTHROID, LEVOTHROID) 75 MCG tablet Take 75 mcg by mouth daily.      Marland Kitchen omeprazole (PRILOSEC) 20 MG capsule Take 20 mg by mouth 2 (two) times daily before a meal.      No current facility-administered medications for this visit.    Allergies:   Review of patient's allergies indicates no known allergies.    Social History:  The patient  reports that she has never smoked. She has never used smokeless tobacco. She reports  that she does not drink alcohol or use illicit drugs.   Family History:  The patient's family history is not on file.    ROS: .   All other systems are reviewed and negative.Unless otherwise mentioned in H&P above.   PHYSICAL EXAM: VS:  BP 180/82 mmHg  Pulse 59  Ht  (1.651 m)  Wt 122 lb (55.339 kg)  BMI 20.30 kg/m2  SpO2 97% , BMI Body mass index is 20.3 kg/(m^2). GEN: Well nourished, well developed, in no acute distress HEENT: normal Neck: no JVD, carotid bruits, or masses Cardiac: RRR; no murmurs, rubs, or gallops,no edema  Respiratory:  clear to auscultation bilaterally, normal work of breathing GI: soft, nontender, nondistended, + BS MS: no deformity or atrophy Skin: warm and dry, no rash Neuro:  Strength and sensation are intact Psych: euthymic mood, full affect   Recent Labs: No results found for requested labs within last 365 days.    Lipid Panel No results found for: CHOL, TRIG, HDL, CHOLHDL, VLDL, LDLCALC, LDLDIRECT    Wt Readings from Last 3 Encounters:  03/13/14 122 lb (55.339 kg)  02/27/14 123 lb (55.792 kg)  02/11/14 125 lb 12.8 oz (57.063 kg)      ASSESSMENT AND PLAN:  1.  Atrial fib: Continue on amiodarone as this is keeping her  in normal rhythm. She is not on anticoagulation.  2. Hypertension: BP has risen since stopping the amlodipine and she feels worse. I will put her back on the 2.5 mg dose.  3.Muscel weakness and fatigue: Will stop atorvastatin for a few weeks to see if this helps. I have gone over all of her labs and medications with her and her son. We will see her in 6 months unless symptomatic.   Current medicines are reviewed at length with the patient today.    No orders of the defined types were placed in this encounter.     Disposition:   FU with 6 monhts  Signed, Joni ReiningKathryn Alvina Strother, NP  03/13/2014 1:39 PM    [facility] [address], [place], [place] [address] Phone: [phone]; Fax: [phone]  [phone]

## 2014-03-13 NOTE — Progress Notes (Deleted)
Name: [name]    DOB: 1920/03/22  Age: 81 y.o.  MR#: [id number]       PCP:  Colette Ribas, MD      Insurance: Payor: BLUE CROSS BLUE SHIELD MEDICARE / Plan: BCBS MEDICARE / Product Type: *No Product type* /   CC:    Chief Complaint  Patient presents with  . Coronary Artery Disease  . Atrial Fibrillation  . Hypertension    VS Filed Vitals:   03/13/14 1309  BP: 180/82  Pulse: 59  Height: 5\' 5"  (1.651 m)  Weight: 122 lb (55.339 kg)  SpO2: 97%    Weights Current Weight  03/13/14 122 lb (55.339 kg)  02/27/14 123 lb (55.792 kg)  02/11/14 125 lb 12.8 oz (57.063 kg)    Blood Pressure  BP Readings from Last 3 Encounters:  03/13/14 180/82  02/27/14 126/68  02/11/14 128/58     Admit date:  (Not on file) Last encounter with RMR:  02/27/2014   Allergy Review of patient's allergies indicates no known allergies.  Current Outpatient Prescriptions  Medication Sig Dispense Refill  . amiodarone (PACERONE) 200 MG tablet Take 0.5 tablets (100 mg total) by mouth daily. 15 tablet 8  . aspirin EC 81 MG tablet Take 1 tablet (81 mg total) by mouth daily. 90 tablet 3  . atorvastatin (LIPITOR) 20 MG tablet Take one tablet by mouth once daily. 30 tablet 7  . furosemide (LASIX) 20 MG tablet Take 1 tablet everyday as needed for swelling. 30 tablet 3  . levothyroxine (SYNTHROID, LEVOTHROID) 75 MCG tablet Take 75 mcg by mouth daily.      03/01/2014 omeprazole (PRILOSEC) 20 MG capsule Take 20 mg by mouth 2 (two) times daily before a meal.      No current facility-administered medications for this visit.    Discontinued Meds:    Medications Discontinued During This Encounter  Medication Reason  . amLODipine (NORVASC) 2.5 MG tablet Error    Patient Active Problem List   Diagnosis Date Noted  . CAD (coronary artery disease) 03/26/2013  . HTN (hypertension) 03/26/2013  . Hyperlipidemia 03/26/2013  . Heart murmur 03/26/2013    LABS    Component Value Date/Time   NA 141 11/24/2010 0929    NA 142 05/15/2009 0540   NA 143 05/14/2009 0529   K 3.7 11/24/2010 0929   K 4.5 05/15/2009 0540   K 4.2 05/14/2009 0529   CL 104 11/24/2010 0929   CL 108 05/15/2009 0540   CL 112 05/14/2009 0529   CO2 26 11/24/2010 0929   CO2 26 05/15/2009 0540   CO2 27 05/14/2009 0529   GLUCOSE 91 11/24/2010 0929   GLUCOSE 100* 05/15/2009 0540   GLUCOSE 92 05/14/2009 0529   BUN 20 11/24/2010 0929   BUN 24* 05/15/2009 0540   BUN 19 05/14/2009 0529   CREATININE 0.99 11/24/2010 0929   CREATININE 1.34* 05/15/2009 0540   CREATININE 1.29* 05/14/2009 0529   CALCIUM 9.9 11/24/2010 0929   CALCIUM 9.5 05/15/2009 0540   CALCIUM 9.4 05/14/2009 0529   GFRNONAA 49* 11/24/2010 0929   GFRNONAA 37* 05/15/2009 0540   GFRNONAA 39* 05/14/2009 0529   GFRAA 57* 11/24/2010 0929   GFRAA * 05/15/2009 0540    45        The eGFR has been calculated using the MDRD equation. This calculation has not been validated in all clinical situations. eGFR's persistently <60 mL/min signify possible Chronic Kidney Disease.   GFRAA * 05/14/2009 05/16/2009  47        The eGFR has been calculated using the MDRD equation. This calculation has not been validated in all clinical situations. eGFR's persistently <60 mL/min signify possible Chronic Kidney Disease.   CMP     Component Value Date/Time   NA 141 11/24/2010 0929   K 3.7 11/24/2010 0929   CL 104 11/24/2010 0929   CO2 26 11/24/2010 0929   GLUCOSE 91 11/24/2010 0929   BUN 20 11/24/2010 0929   CREATININE 0.99 11/24/2010 0929   CALCIUM 9.9 11/24/2010 0929   PROT 6.9 05/10/2009 1412   ALBUMIN 3.5 05/10/2009 1412   AST 25 05/10/2009 1412   ALT 20 05/10/2009 1412   ALKPHOS 69 05/10/2009 1412   BILITOT 0.7 05/10/2009 1412   GFRNONAA 49* 11/24/2010 0929   GFRAA 57* 11/24/2010 0929       Component Value Date/Time   WBC 4.9 11/24/2010 0929   WBC 4.3 05/15/2009 0540   WBC 4.6 05/14/2009 0529   HGB 15.4* 11/24/2010 0929   HGB 11.8* 05/15/2009 0540   HGB 12.2  05/14/2009 0529   HCT 44.6 11/24/2010 0929   HCT 34.0* 05/15/2009 0540   HCT 35.2* 05/14/2009 0529   MCV 91.8 11/24/2010 0929   MCV 94.4 05/15/2009 0540   MCV 95.2 05/14/2009 0529    Lipid Panel  No results found for: CHOL, TRIG, HDL, CHOLHDL, VLDL, LDLCALC, LDLDIRECT  ABG No results found for: PHART, PCO2ART, PO2ART, HCO3, TCO2, ACIDBASEDEF, O2SAT   Lab Results  Component Value Date   TSH 0.150 ***Test methodology is 3rd generation TSH**** 05/10/2009   BNP (last 3 results) No results for input(s): BNP in the last 8760 hours.  ProBNP (last 3 results) No results for input(s): PROBNP in the last 8760 hours.  Cardiac Panel (last 3 results) No results for input(s): CKTOTAL, CKMB, TROPONINI, RELINDX in the last 72 hours.  Iron/TIBC/Ferritin/ %Sat No results found for: IRON, TIBC, FERRITIN, IRONPCTSAT   EKG Orders placed or performed in visit on 02/11/14  . EKG 12-Lead     Prior Assessment and Plan Problem List as of 03/13/2014      Cardiovascular and Mediastinum   CAD (coronary artery disease)   HTN (hypertension)     Other   Hyperlipidemia   Heart murmur       Imaging: No results found.

## 2014-03-13 NOTE — Patient Instructions (Addendum)
Your physician wants you to follow-up in: 6 months with [name], NP.  You will receive a reminder letter in the mail two months in advance. If you don't receive a letter, please call our office to schedule the follow-up appointment.  Your physician has recommended you make the following change in your medication:   HOLD: Atorvastatin for 2 weeks then call the office at [phone] and let us know how you feel and we will instruct you on what to do.   Start taking multivitamin   Thank you for choosing [facility]!

## 2014-03-27 ENCOUNTER — Telehealth: Payer: Self-pay | Admitting: Adult Health

## 2014-03-27 NOTE — Telephone Encounter (Signed)
Change dose of lipitor atorvastatin to QOD. If this does not help with myalgia's please have her call us back.

## 2014-03-27 NOTE — Telephone Encounter (Signed)
Pt will try QOD and call will call for any problems,,daughter also notified

## 2014-03-27 NOTE — Telephone Encounter (Signed)
Please return call regarding cholesterol medications / tg

## 2014-03-27 NOTE — Telephone Encounter (Signed)
Per your last office note,pt stayed of atorvastatin and leg fatigue and pain decreased.Daughter now calling asking for dispo     Will forward to Harriet PhoK Lawrence NP

## 2014-03-30 MED ORDER — ATORVASTATIN CALCIUM 20 MG PO TABS: 20.0000 mg | ORAL_TABLET | ORAL | Status: DC

## 2014-03-30 NOTE — Addendum Note (Signed)
Addended by: Marlyn CorporalARLTON, CATHERINE A on: 03/30/2014 11:29 AM   Modules accepted: Orders, Medications

## 2014-07-13 ENCOUNTER — Encounter: Payer: Self-pay | Admitting: *Deleted

## 2014-08-05 ENCOUNTER — Encounter: Payer: Self-pay | Admitting: Cardiovascular Disease

## 2014-09-16 ENCOUNTER — Encounter: Payer: Self-pay | Admitting: Cardiology

## 2014-09-16 ENCOUNTER — Ambulatory Visit (INDEPENDENT_AMBULATORY_CARE_PROVIDER_SITE_OTHER): Payer: Medicare Other | Admitting: Cardiology

## 2014-09-16 VITALS — BP 140/70 | HR 61 | Ht 66.0 in | Wt 116.0 lb

## 2014-09-16 DIAGNOSIS — I480 Paroxysmal atrial fibrillation: Secondary | ICD-10-CM

## 2014-09-16 DIAGNOSIS — I10 Essential (primary) hypertension: Secondary | ICD-10-CM | POA: Diagnosis not present

## 2014-09-16 DIAGNOSIS — R42 Dizziness and giddiness: Secondary | ICD-10-CM

## 2014-09-16 DIAGNOSIS — I2510 Atherosclerotic heart disease of native coronary artery without angina pectoris: Secondary | ICD-10-CM

## 2014-09-16 DIAGNOSIS — E785 Hyperlipidemia, unspecified: Secondary | ICD-10-CM

## 2014-09-16 NOTE — Patient Instructions (Signed)

## 2014-09-16 NOTE — Progress Notes (Signed)
Patient ID: GENAVIEVE MANGIAPANE, female   DOB: [birth year], 81 y.o.   MRN: [id number]     Clinical Summary Ms. Elizondo is a 81 y.o.female seen today for follow up of the following medical problems.    1. CAD  - cath 2008 with 80% diag, 80% ostial LCX that has been treated medically per notes. LVEF at that time 60% by LV gram. - denies any recent chest pain.,SOB or DOE.  - compliant with meds  2. Parox afib  - has been managed on amio, has had prior DCCV 2012.She had bradycardia when previously on dilt .  - not on anticoag by her previous cardiologist.From what I can gather from the notes it seems this is because she has not had any clear recurrence of her afib since her DCCV  - denies any recent palpitations.    3. HTN  - checks frequently, typically around 140s/70s   4. Hyperlipidemia  - statin recently stopped for possible muscle symptoms with improvement of symptoms. Restarted on every other day dosing and she is tolerating well  5. Dizziness - primarily occurs with standings - drinks tea and ginger ale mainly, very litter water or electrolyte rich fluids     Past Medical History  Diagnosis Date  . Myocardial infarct   . Hypertension   . Hypercholesteremia   . Thyroid disease   . Acid reflux   . Skin disorder   . Coronary artery disease   . PAF (paroxysmal atrial fibrillation)   . Carotid artery occlusion     minimal  . Systolic murmur      No Known Allergies   Current Outpatient Prescriptions  Medication Sig Dispense Refill  . amiodarone (PACERONE) 200 MG tablet Take 0.5 tablets (100 mg total) by mouth daily. 15 tablet 8  . aspirin EC 81 MG tablet Take 1 tablet (81 mg total) by mouth daily. 90 tablet 3  . atorvastatin (LIPITOR) 20 MG tablet Take 1 tablet (20 mg total) by mouth every other day. Take one tablet by mouth once daily. 30 tablet 7  . furosemide (LASIX) 20 MG tablet Take 1 tablet everyday as needed for swelling. 30 tablet 3  . levothyroxine  (SYNTHROID, LEVOTHROID) 75 MCG tablet Take 75 mcg by mouth daily.      Marland Kitchen omeprazole (PRILOSEC) 20 MG capsule Take 20 mg by mouth 2 (two) times daily before a meal.      No current facility-administered medications for this visit.     Past Surgical History  Procedure Laterality Date  . Abdominal hysterectomy    . Thyroid surgery    . Cardiac catheterization    . Cardioversion  2012     No Known Allergies    Family History  Problem Relation Age of Onset  . Family history unknown: Yes     Social History Ms. Alewine reports that she has never smoked. She has never used smokeless tobacco. Ms. Mersman reports that she does not drink alcohol.   Review of Systems CONSTITUTIONAL: No weight loss, fever, chills, weakness or fatigue.  HEENT: Eyes: No visual loss, blurred vision, double vision or yellow sclerae.No hearing loss, sneezing, congestion, runny nose or sore throat.  SKIN: No rash or itching.  CARDIOVASCULAR: per HPI RESPIRATORY: No shortness of breath, cough or sputum.  GASTROINTESTINAL: No anorexia, nausea, vomiting or diarrhea. No abdominal pain or blood.  GENITOURINARY: No burning on urination, no polyuria NEUROLOGICAL: dizziness MUSCULOSKELETAL: No muscle, back pain, joint pain or stiffness.  LYMPHATICS: No enlarged  nodes. No history of splenectomy.  PSYCHIATRIC: No history of depression or anxiety.  ENDOCRINOLOGIC: No reports of sweating, cold or heat intolerance. No polyuria or polydipsia.  Marland Kitchen   Physical Examination Filed Vitals:   09/16/14 0931  BP: 140/70  Pulse: 61   Filed Vitals:   09/16/14 0931  Height: 5\' 6"  (1.676 m)  Weight: 116 lb (52.617 kg)    Gen: resting comfortably, no acute distress HEENT: no scleral icterus, pupils equal round and reactive, no palptable cervical adenopathy,  CV: RRR, no m/r/g, no jvd Resp: Clear to auscultation bilaterally GI: abdomen is soft, non-tender, non-distended, normal bowel sounds, no hepatosplenomegaly MSK:  extremities are warm, no edema.  Skin: warm, no rash Neuro:  no focal deficits Psych: appropriate affect   Diagnostic Studies 04/2012 Echo  Severe LVH, grade I diastolic dysfunction, mild MR. No LVEF mentioned. Aortic sclerosis without stenosis   03/2006 Cath  RESULTS:  1. Hemodynamic monitoring: Central aortic pressure was 184/80. Left  ventricular pressure was 184/22 with no significant valve gradient  at the time of pull-back.  2. Ventriculography: Ventriculography in the RAO projection using 20  mL of contrast, 12 mL per second, revealed normal LV systolic  function, mild left ventricular hypertrophy. Ejection fraction  excess of 60% and the end-diastolic pressure was 25.  3. Distal aortogram: Distal aortogram done above the level of renal  artery showed no evidence of renal artery stenosis and no abdominal  aortic aneurysm.  CORONARY ARTERIOGRAPHY: On fluoroscopy, faint calcification was seen in  the distribution of the left main, proximal LAD and proximal right  coronary artery.  1. Left main normal. It trifurcated.  2. Circumflex. There was ostial 80% narrowing of the circumflex as it  came off the left main. The mid and distal vessel were free of  disease.  3. Ramus intermediate (optional diagonal) moderate size and normal.  4. Left anterior descending. The LAD extended down to the apex of the  heart. The distal LAD was free of disease. The mid portion of the  LAD had an area of 30-40% narrowing at the takeoff of first  diagonal. The first diagonal had an 85% area of ostial narrowing.  5. Right coronary artery. The right coronary artery gave rise to the  PDA. This vessel was free of disease. Several injections of the  cusp did not show any evidence of ostial narrowing.  CONCLUSION:  1. High-grade ostial narrowing of the circumflex at the left main.  2. Ostial narrowing of the first diagonal at the take-off of the LAD.  3. Left ventricular  hypertrophy.  4. Normal LV systolic function.  It appears this lady will need bypass surgery because of the ostial  circumflex lesion. I plan to ask my colleagues for their opinion before  we refer her to CVTS. She will be restarted on IV heparin in about 3  hours following her cath. I have temporarily stopped her Coumadin.      Assessment and Plan   1. CAD  - no current chest pain  - continue risk factor modification, continue current meds  2. Parox afib  - no recent episodes. Has not been on anticoag from her prior cardiologist, I presume due to no detected recurrence of afib after her DCCV in 2012 - continue to follow  3. HTN  - at goal, continue current meds   4. Hyperlipidemia  - muscle aches on daily statin, tolerating every other day dosing.   5. Dizziness - orthostatic dizziness by  history. Poor oral intake, drinks mainly caffeinated beverages. Explained caffeine acts as diuretic. Encouraged to drink more water or electrolyte rich fluids  F/u 6 months      Antoine Poche, M.D.

## 2015-03-19 ENCOUNTER — Ambulatory Visit (INDEPENDENT_AMBULATORY_CARE_PROVIDER_SITE_OTHER): Payer: Medicare Other | Admitting: Cardiology

## 2015-03-19 ENCOUNTER — Encounter: Payer: Self-pay | Admitting: *Deleted

## 2015-03-19 ENCOUNTER — Encounter: Payer: Self-pay | Admitting: Cardiology

## 2015-03-19 VITALS — BP 160/50 | HR 60 | Ht 65.0 in | Wt 111.0 lb

## 2015-03-19 DIAGNOSIS — I480 Paroxysmal atrial fibrillation: Secondary | ICD-10-CM | POA: Diagnosis not present

## 2015-03-19 DIAGNOSIS — E785 Hyperlipidemia, unspecified: Secondary | ICD-10-CM

## 2015-03-19 DIAGNOSIS — R0600 Dyspnea, unspecified: Secondary | ICD-10-CM

## 2015-03-19 DIAGNOSIS — I2510 Atherosclerotic heart disease of native coronary artery without angina pectoris: Secondary | ICD-10-CM

## 2015-03-19 DIAGNOSIS — I10 Essential (primary) hypertension: Secondary | ICD-10-CM | POA: Diagnosis not present

## 2015-03-19 DIAGNOSIS — R0609 Other forms of dyspnea: Secondary | ICD-10-CM

## 2015-03-19 NOTE — Progress Notes (Signed)
Patient ID: [name], female   DOB: 01/29/1920, 82 y.o.   MRN: [id number]     Clinical Summary [name] is a 82 y.o.female  1. CAD  - cath 2008 with 80% diag, 80% ostial LCX that has been treated medically per notes. LVEF at that time 60% by LV gram.  - no recent chest pain  2. Parox afib  - has been managed on amio, has had prior DCCV 2012.She had bradycardia when previously on dilt .  - not on anticoag by her previous cardiologist.From what I can gather from the notes it seems this is because she has not had any clear recurrence of her afib since her DCCV  - denies any recent palpitations since last visit.   3. HTN  - checks frequently, typically around 140s/70s   4. Hyperlipidemia  - did not tolerate daily statin, doing well on every other day dosing.   5. Dizziness - primarily occurs with standing - drinks tea and ginger ale mainly, very litter water or electrolyte rich fluids. She has not increased her oral intake since we discussed this at last visit.   6. DOE - notes gradual progression of DOE, example doing housework at home that she used to be able to do more comfortably   Past Medical History  Diagnosis Date  . Myocardial infarct   . Hypertension   . Hypercholesteremia   . Thyroid disease   . Acid reflux   . Skin disorder   . Coronary artery disease   . PAF (paroxysmal atrial fibrillation)   . Carotid artery occlusion     minimal  . Systolic murmur      No Known Allergies   Current Outpatient Prescriptions  Medication Sig Dispense Refill  . amiodarone (PACERONE) 200 MG tablet Take 0.5 tablets (100 mg total) by mouth daily. 15 tablet 8  . amLODipine (NORVASC) 2.5 MG tablet Take 2.5 mg by mouth daily.  5  . aspirin EC 81 MG tablet Take 1 tablet (81 mg total) by mouth daily. 90 tablet 3  . atorvastatin (LIPITOR) 20 MG tablet Take 1 tablet (20 mg total) by mouth every other day. Take one tablet by mouth once daily. 30 tablet 7  . furosemide  (LASIX) 20 MG tablet Take 1 tablet everyday as needed for swelling. 30 tablet 3  . levothyroxine (SYNTHROID, LEVOTHROID) 75 MCG tablet Take 75 mcg by mouth daily.      Marland Kitchen omeprazole (PRILOSEC) 20 MG capsule Take 20 mg by mouth 2 (two) times daily before a meal.     . vitamin B-12 (CYANOCOBALAMIN) 1000 MCG tablet Take 1,000 mcg by mouth daily.     No current facility-administered medications for this visit.     Past Surgical History  Procedure Laterality Date  . Abdominal hysterectomy    . Thyroid surgery    . Cardiac catheterization    . Cardioversion  2012     No Known Allergies    Family History  Problem Relation Age of Onset  . Family history unknown: Yes     Social History [name] reports that she has never smoked. She has never used smokeless tobacco. [name] reports that she does not drink alcohol.   Review of Systems CONSTITUTIONAL: No weight loss, fever, chills, weakness or fatigue.  HEENT: Eyes: No visual loss, blurred vision, double vision or yellow sclerae.No hearing loss, sneezing, congestion, runny nose or sore throat.  SKIN: No rash or itching.  CARDIOVASCULAR: per HPI RESPIRATORY: No  cough or sputum.  GASTROINTESTINAL: No anorexia, nausea, vomiting or diarrhea. No abdominal pain or blood.  GENITOURINARY: No burning on urination, no polyuria NEUROLOGICAL: No headache, dizziness, syncope, paralysis, ataxia, numbness or tingling in the extremities. No change in bowel or bladder control.  MUSCULOSKELETAL: No muscle, back pain, joint pain or stiffness.  LYMPHATICS: No enlarged nodes. No history of splenectomy.  PSYCHIATRIC: No history of depression or anxiety.  ENDOCRINOLOGIC: No reports of sweating, cold or heat intolerance. No polyuria or polydipsia.  Marland Kitchen.   Physical Examination Filed Vitals:   03/19/15 1136  BP: 160/50  Pulse: 60   Filed Vitals:   03/19/15 1136  Height: 5\' 5"  (1.651 m)  Weight: 111 lb (50.349 kg)    Gen: resting  comfortably, no acute distress HEENT: no scleral icterus, pupils equal round and reactive, no palptable cervical adenopathy,  CV: RRR, no m/r/g no jvd Resp: Clear to auscultation bilaterally GI: abdomen is soft, non-tender, non-distended, normal bowel sounds, no hepatosplenomegaly MSK: extremities are warm, no edema.  Skin: warm, no rash Neuro:  no focal deficits Psych: appropriate affect   Diagnostic Studies 04/2012 Echo  Severe LVH, grade I diastolic dysfunction, mild MR. No LVEF mentioned. Aortic sclerosis without stenosis   03/2006 Cath  RESULTS:  1. Hemodynamic monitoring: Central aortic pressure was 184/80. Left  ventricular pressure was 184/22 with no significant valve gradient  at the time of pull-back.  2. Ventriculography: Ventriculography in the RAO projection using 20  mL of contrast, 12 mL per second, revealed normal LV systolic  function, mild left ventricular hypertrophy. Ejection fraction  excess of 60% and the end-diastolic pressure was 25.  3. Distal aortogram: Distal aortogram done above the level of renal  artery showed no evidence of renal artery stenosis and no abdominal  aortic aneurysm.  CORONARY ARTERIOGRAPHY: On fluoroscopy, faint calcification was seen in  the distribution of the left main, proximal LAD and proximal right  coronary artery.  1. Left main normal. It trifurcated.  2. Circumflex. There was ostial 80% narrowing of the circumflex as it  came off the left main. The mid and distal vessel were free of  disease.  3. Ramus intermediate (optional diagonal) moderate size and normal.  4. Left anterior descending. The LAD extended down to the apex of the  heart. The distal LAD was free of disease. The mid portion of the  LAD had an area of 30-40% narrowing at the takeoff of first  diagonal. The first diagonal had an 85% area of ostial narrowing.  5. Right coronary artery. The right coronary artery gave rise to the  PDA.  This vessel was free of disease. Several injections of the  cusp did not show any evidence of ostial narrowing.  CONCLUSION:  1. High-grade ostial narrowing of the circumflex at the left main.  2. Ostial narrowing of the first diagonal at the take-off of the LAD.  3. Left ventricular hypertrophy.  4. Normal LV systolic function.  It appears this lady will need bypass surgery because of the ostial  circumflex lesion. I plan to ask my colleagues for their opinion before  we refer her to CVTS. She will be restarted on IV heparin in about 3  hours following her cath. I have temporarily stopped her Coumadin.         Assessment and Plan   1. CAD  - no current chest pain  - continue current meds  2. Parox afib  - no recent episodes. Has not been on  anticoag from her prior cardiologist, I presume due to no detected recurrence of afib after her DCCV in 2012 - continue to monitor, continue current meds  3. HTN  - at goal, we will continue current meds   4. Hyperlipidemia  - muscle aches on daily statin, tolerating every other day dosing. - continue current statin   5. Dizziness - orthostatic dizziness by history. Poor oral intake,encouraged to increase oral hydration  6. DOE - order echo  F/u 3 months   Antoine Poche, M.D.

## 2015-03-19 NOTE — Patient Instructions (Signed)
Your physician recommends that you schedule a follow-up appointment in: 3 months with Dr [name]   Your physician has requested that you have an echocardiogram. Echocardiography is a painless test that uses sound waves to create images of your heart. It provides your doctor with information about the size and shape of your heart and how well your heart's chambers and valves are working. This procedure takes approximately one hour. There are no restrictions for this procedure.    Your physician recommends that you continue on your current medications as directed. Please refer to the Current Medication list given to you today.    Thank you for choosing [facility] !

## 2015-03-24 ENCOUNTER — Ambulatory Visit (HOSPITAL_COMMUNITY)
Admission: RE | Admit: 2015-03-24 | Discharge: 2015-03-24 | Disposition: A | Discharge status: Home / Self Care | Payer: Medicare Other | Source: Ambulatory Visit | Attending: Cardiology | Admitting: Cardiology

## 2015-03-24 DIAGNOSIS — R0609 Other forms of dyspnea: Secondary | ICD-10-CM | POA: Diagnosis present

## 2015-03-24 DIAGNOSIS — E785 Hyperlipidemia, unspecified: Secondary | ICD-10-CM | POA: Diagnosis not present

## 2015-03-24 DIAGNOSIS — R011 Cardiac murmur, unspecified: Secondary | ICD-10-CM | POA: Insufficient documentation

## 2015-03-24 DIAGNOSIS — I10 Essential (primary) hypertension: Secondary | ICD-10-CM | POA: Insufficient documentation

## 2015-03-24 DIAGNOSIS — R0600 Dyspnea, unspecified: Secondary | ICD-10-CM

## 2015-03-24 DIAGNOSIS — I2510 Atherosclerotic heart disease of native coronary artery without angina pectoris: Secondary | ICD-10-CM | POA: Insufficient documentation

## 2015-06-24 ENCOUNTER — Ambulatory Visit (INDEPENDENT_AMBULATORY_CARE_PROVIDER_SITE_OTHER): Payer: Medicare Other | Admitting: Cardiology

## 2015-06-24 ENCOUNTER — Encounter: Payer: Self-pay | Admitting: Cardiology

## 2015-06-24 VITALS — BP 144/68 | HR 58 | Ht 65.0 in | Wt 107.0 lb

## 2015-06-24 DIAGNOSIS — E785 Hyperlipidemia, unspecified: Secondary | ICD-10-CM

## 2015-06-24 DIAGNOSIS — I480 Paroxysmal atrial fibrillation: Secondary | ICD-10-CM | POA: Diagnosis not present

## 2015-06-24 DIAGNOSIS — I10 Essential (primary) hypertension: Secondary | ICD-10-CM

## 2015-06-24 DIAGNOSIS — I2510 Atherosclerotic heart disease of native coronary artery without angina pectoris: Secondary | ICD-10-CM

## 2015-06-24 DIAGNOSIS — E7800 Pure hypercholesterolemia, unspecified: Secondary | ICD-10-CM

## 2015-06-24 NOTE — Progress Notes (Addendum)
Clinical Summary [name] is a 82 y.o.female seen today for follow up of the following medical problems.   1. CAD  - cath 2008 with 80% diag, 80% ostial LCX that has been treated medically per notes. LVEF at that time 60% by LV gram.  - no recent chest pain  - no recent chest pain since last visit - compliant with meds  2. Parox afib  - has been managed on amio, has had prior DCCV 2012.She had bradycardia when previously on dilt .  - not on anticoag by her previous cardiologist.From what I can gather from the notes it seems this is because she has not had any clear recurrence of her afib since her DCCV    - denies any recent palpitations since last visit.   3. HTN  - checks frequently, typically around 140s/60s   4. Hyperlipidemia  - did not tolerate daily statin, doing well on every other day dosing.   5. Dizziness - primarily occurs with standing - drinks tea and ginger ale mainly, very little water or electrolyte rich fluids. She has not increased her oral intake since we discussed this at last visit.   - improved since last visit with increased water intake.  6. DOE - notes gradual progression of DOE, example doing housework at home that she used to be able to do more comfortably  - since last visit completed echo 03/2015 with LVEF 60-65%, abnormal diastolic function   Past Medical History  Diagnosis Date  . Myocardial infarct (HCC)   . Hypertension   . Hypercholesteremia   . Thyroid disease   . Acid reflux   . Skin disorder   . Coronary artery disease   . PAF (paroxysmal atrial fibrillation) (HCC)   . Carotid artery occlusion     minimal  . Systolic murmur      No Known Allergies   Current Outpatient Prescriptions  Medication Sig Dispense Refill  . amiodarone (PACERONE) 200 MG tablet Take 0.5 tablets (100 mg total) by mouth daily. 15 tablet 8  . amLODipine (NORVASC) 2.5 MG tablet Take 2.5 mg by mouth daily.  5  . aspirin EC 81 MG  tablet Take 1 tablet (81 mg total) by mouth daily. 90 tablet 3  . atorvastatin (LIPITOR) 20 MG tablet Take 1 tablet (20 mg total) by mouth every other day. Take one tablet by mouth once daily. 30 tablet 7  . furosemide (LASIX) 20 MG tablet Take 1 tablet everyday as needed for swelling. 30 tablet 3  . levothyroxine (SYNTHROID, LEVOTHROID) 75 MCG tablet Take 75 mcg by mouth daily.      Marland Kitchen. omeprazole (PRILOSEC) 20 MG capsule Take 20 mg by mouth 2 (two) times daily before a meal.     . vitamin B-12 (CYANOCOBALAMIN) 1000 MCG tablet Take 1,000 mcg by mouth daily.     No current facility-administered medications for this visit.     Past Surgical History  Procedure Laterality Date  . Abdominal hysterectomy    . Thyroid surgery    . Cardiac catheterization    . Cardioversion  2012     No Known Allergies    Family History  Problem Relation Age of Onset  . Family history unknown: Yes     Social History [name] reports that she has never smoked. She has never used smokeless tobacco. [name] reports that she does not drink alcohol.   Review of Systems CONSTITUTIONAL: No weight loss, fever, chills, weakness or  fatigue.  HEENT: Eyes: No visual loss, blurred vision, double vision or yellow sclerae.No hearing loss, sneezing, congestion, runny nose or sore throat.  SKIN: No rash or itching.  CARDIOVASCULAR: per HPI RESPIRATORY: occasional SOB GASTROINTESTINAL: No anorexia, nausea, vomiting or diarrhea. No abdominal pain or blood.  GENITOURINARY: No burning on urination, no polyuria NEUROLOGICAL: No headache, dizziness, syncope, paralysis, ataxia, numbness or tingling in the extremities. No change in bowel or bladder control.  MUSCULOSKELETAL: No muscle, back pain, joint pain or stiffness.  LYMPHATICS: No enlarged nodes. No history of splenectomy.  PSYCHIATRIC: No history of depression or anxiety.  ENDOCRINOLOGIC: No reports of sweating, cold or heat intolerance. No polyuria or  polydipsia.  Marland Kitchen   Physical Examination Filed Vitals:   06/24/15 1103  BP: 144/68  Pulse: 58   Filed Vitals:   06/24/15 1103  Height:  (1.651 m)  Weight: 107 lb (48.535 kg)    Gen: resting comfortably, no acute distress HEENT: no scleral icterus, pupils equal round and reactive, no palptable cervical adenopathy,  CV: RRR, 2/6 systolic LLSB, no jvd Resp: Clear to auscultation bilaterally GI: abdomen is soft, non-tender, non-distended, normal bowel sounds, no hepatosplenomegaly MSK: extremities are warm, no edema.  Skin: warm, no rash Neuro:  no focal deficits Psych: appropriate affect   Diagnostic Studies  04/2012 Echo  Severe LVH, grade I diastolic dysfunction, mild MR. No LVEF mentioned. Aortic sclerosis without stenosis   03/2006 Cath  RESULTS:  1. Hemodynamic monitoring: Central aortic pressure was 184/80. Left  ventricular pressure was 184/22 with no significant valve gradient  at the time of pull-back.  2. Ventriculography: Ventriculography in the RAO projection using 20  mL of contrast, 12 mL per second, revealed normal LV systolic  function, mild left ventricular hypertrophy. Ejection fraction  excess of 60% and the end-diastolic pressure was 25.  3. Distal aortogram: Distal aortogram done above the level of renal  artery showed no evidence of renal artery stenosis and no abdominal  aortic aneurysm.  CORONARY ARTERIOGRAPHY: On fluoroscopy, faint calcification was seen in  the distribution of the left main, proximal LAD and proximal right  coronary artery.  1. Left main normal. It trifurcated.  2. Circumflex. There was ostial 80% narrowing of the circumflex as it  came off the left main. The mid and distal vessel were free of  disease.  3. Ramus intermediate (optional diagonal) moderate size and normal.  4. Left anterior descending. The LAD extended down to the apex of the  heart. The distal LAD was free of disease. The mid portion of  the  LAD had an area of 30-40% narrowing at the takeoff of first  diagonal. The first diagonal had an 85% area of ostial narrowing.  5. Right coronary artery. The right coronary artery gave rise to the  PDA. This vessel was free of disease. Several injections of the  cusp did not show any evidence of ostial narrowing.  CONCLUSION:  1. High-grade ostial narrowing of the circumflex at the left main.  2. Ostial narrowing of the first diagonal at the take-off of the LAD.  3. Left ventricular hypertrophy.  4. Normal LV systolic function.  It appears this lady will need bypass surgery because of the ostial  circumflex lesion. I plan to ask my colleagues for their opinion before  we refer her to CVTS. She will be restarted on IV heparin in about 3  hours following her cath. I have temporarily stopped her Coumadin.    03/2015 Echo  Study Conclusions  - Left ventricle: The cavity size was normal. Wall thickness was  increased in a pattern of severe LVH. Systolic function was  normal. The estimated ejection fraction was in the range of 60%  to 65%. Wall motion was normal; there were no regional wall  motion abnormalities. - Aortic valve: Valve area (VTI): 1.28 cm^2. Valve area (Vmax):  1.31 cm^2. Valve area (Vmean): 1.26 cm^2. - Mitral valve: Thickening and calcification of the basal leaflets.  Calcified annulus. There was mild regurgitation. - Left atrium: The atrium was moderately dilated. - Atrial septum: No defect or patent foramen ovale was identified. - Pulmonary arteries: PA peak pressure: 39 mm Hg (S). - Pericardium, extracardiac: Small mostly posterior pericardial  effusion no tamponade.   Assessment and Plan  1. CAD  - no recent chest pain. Stable SOB. Would not pursue ischemic testing for her SOB in absence of chest pain, stable SOB, and would ultimately be hesitant to refer to cath given her advanced age and fragility.  - continue current meds  2.  Parox afib  - no recent episodes. Has not been on anticoag from her prior cardiologist, I presume due to no detected recurrence of afib after her DCCV in 2012 -  continue current meds  3. HTN  - at goal, we will continue current meds   4. Hyperlipidemia  - muscle aches on daily statin, tolerating every other day dosing. - continue current meds  5. Dizziness - orthostatic dizziness by history.  - improved with increased hydration  6. DOE - fairly benign echo. Would not pursue ischemic testing as described above. Could consider PFTs, however she is not interestd at this time. For her age she is actually fairly active, symptoms do not appear to be that limiting.    F/3 months   Antoine PocheJonathan F. Branch, M.D.

## 2015-06-24 NOTE — Patient Instructions (Signed)
Medication Instructions:  Your physician recommends that you continue on your current medications as directed. Please refer to the Current Medication list given to you today.   Labwork: I WILL REQUEST LABS FROM PCP  Testing/Procedures: NONE  Follow-Up: Your physician recommends that you schedule a follow-up appointment in: 3 MONTHS    Any Other Special Instructions Will Be Listed Below (If Applicable).     If you need a refill on your cardiac medications before your next appointment, please call your pharmacy.   

## 2015-08-17 ENCOUNTER — Encounter: Payer: Self-pay | Admitting: Cardiology

## 2015-09-14 ENCOUNTER — Ambulatory Visit: Payer: Medicare Other | Admitting: Cardiology

## 2015-09-21 ENCOUNTER — Ambulatory Visit (INDEPENDENT_AMBULATORY_CARE_PROVIDER_SITE_OTHER): Payer: Medicare Other | Admitting: Cardiology

## 2015-09-21 ENCOUNTER — Encounter: Payer: Self-pay | Admitting: Cardiology

## 2015-09-21 VITALS — BP 138/60 | HR 57 | Ht 66.0 in | Wt 107.0 lb

## 2015-09-21 DIAGNOSIS — I10 Essential (primary) hypertension: Secondary | ICD-10-CM | POA: Diagnosis not present

## 2015-09-21 DIAGNOSIS — I480 Paroxysmal atrial fibrillation: Secondary | ICD-10-CM

## 2015-09-21 DIAGNOSIS — E785 Hyperlipidemia, unspecified: Secondary | ICD-10-CM | POA: Diagnosis not present

## 2015-09-21 DIAGNOSIS — I2510 Atherosclerotic heart disease of native coronary artery without angina pectoris: Secondary | ICD-10-CM | POA: Diagnosis not present

## 2015-09-21 NOTE — Progress Notes (Signed)
Clinical Summary Ms. Marquart is a 82 y.o.female seen today for follow up of the following medical problems.    1. CAD  - cath 2008 with 80% diag, 80% ostial LCX that has been treated medically per notes. LVEF at that time 60% by LV gram.   - no recent chest pain. No SOB or DOE - compliant with meds    2. Parox afib  - has been managed on amio, has had prior DCCV 2012.She had bradycardia when previously on dilt .  - not on anticoag by her previous cardiologist.From what I can gather from the notes it seems this is because she has not had any clear recurrence of her afib since her DCCV   - denies any recent palpitations since our last visit.   3. HTN  - checks frequently, typically around 130s/60s   4. Hyperlipidemia  - did not tolerate daily statin, doing well on every other day dosing.     Past Medical History:  Diagnosis Date  . Acid reflux   . Carotid artery occlusion    minimal  . Coronary artery disease   . Hypercholesteremia   . Hypertension   . Myocardial infarct (HCC)   . PAF (paroxysmal atrial fibrillation) (HCC)   . Skin disorder   . Systolic murmur   . Thyroid disease      No Known Allergies   Current Outpatient Prescriptions  Medication Sig Dispense Refill  . amiodarone (PACERONE) 200 MG tablet Take 0.5 tablets (100 mg total) by mouth daily. 15 tablet 8  . amLODipine (NORVASC) 2.5 MG tablet Take 2.5 mg by mouth daily.  5  . aspirin EC 81 MG tablet Take 1 tablet (81 mg total) by mouth daily. 90 tablet 3  . atorvastatin (LIPITOR) 20 MG tablet Take 1 tablet (20 mg total) by mouth every other day. Take one tablet by mouth once daily. 30 tablet 7  . furosemide (LASIX) 20 MG tablet Take 1 tablet everyday as needed for swelling. 30 tablet 3  . levothyroxine (SYNTHROID, LEVOTHROID) 75 MCG tablet Take 75 mcg by mouth daily.      Marland Kitchen omeprazole (PRILOSEC) 20 MG capsule Take 20 mg by mouth 2 (two) times daily before a meal.     . vitamin B-12  (CYANOCOBALAMIN) 1000 MCG tablet Take 1,000 mcg by mouth daily.     No current facility-administered medications for this visit.      Past Surgical History:  Procedure Laterality Date  . ABDOMINAL HYSTERECTOMY    . CARDIAC CATHETERIZATION    . CARDIOVERSION  2012  . THYROID SURGERY       No Known Allergies    Family History  Problem Relation Age of Onset  . Family history unknown: Yes     Social History Ms. Ralls reports that she has never smoked. She has never used smokeless tobacco. Ms. Cullifer reports that she does not drink alcohol.   Review of Systems CONSTITUTIONAL: No weight loss, fever, chills, weakness or fatigue.  HEENT: Eyes: No visual loss, blurred vision, double vision or yellow sclerae.No hearing loss, sneezing, congestion, runny nose or sore throat.  SKIN: No rash or itching.  CARDIOVASCULAR: per HPI RESPIRATORY: No shortness of breath, cough or sputum.  GASTROINTESTINAL: No anorexia, nausea, vomiting or diarrhea. No abdominal pain or blood.  GENITOURINARY: No burning on urination, no polyuria NEUROLOGICAL: No headache, dizziness, syncope, paralysis, ataxia, numbness or tingling in the extremities. No change in bowel or bladder control.  MUSCULOSKELETAL: No  muscle, back pain, joint pain or stiffness.  LYMPHATICS: No enlarged nodes. No history of splenectomy.  PSYCHIATRIC: No history of depression or anxiety.  ENDOCRINOLOGIC: No reports of sweating, cold or heat intolerance. No polyuria or polydipsia.  Marland Kitchen.   Physical Examination Vitals:   09/21/15 1342  BP: 138/60  Pulse: (!) 57   Vitals:   09/21/15 1342  Weight: 107 lb (48.5 kg)  Height: 5\' 6"  (1.676 m)    Gen: resting comfortably, no acute distress HEENT: no scleral icterus, pupils equal round and reactive, no palptable cervical adenopathy,  CV: RRR, no m/r/g, no jvd Resp: Clear to auscultation bilaterally GI: abdomen is soft, non-tender, non-distended, normal bowel sounds, no  hepatosplenomegaly MSK: extremities are warm, no edema.  Skin: warm, no rash Neuro:  no focal deficits Psych: appropriate affect   Diagnostic Studies  04/2012 Echo  Severe LVH, grade I diastolic dysfunction, mild MR. No LVEF mentioned. Aortic sclerosis without stenosis   03/2006 Cath  RESULTS:  1. Hemodynamic monitoring: Central aortic pressure was 184/80. Left  ventricular pressure was 184/22 with no significant valve gradient  at the time of pull-back.  2. Ventriculography: Ventriculography in the RAO projection using 20  mL of contrast, 12 mL per second, revealed normal LV systolic  function, mild left ventricular hypertrophy. Ejection fraction  excess of 60% and the end-diastolic pressure was 25.  3. Distal aortogram: Distal aortogram done above the level of renal  artery showed no evidence of renal artery stenosis and no abdominal  aortic aneurysm.  CORONARY ARTERIOGRAPHY: On fluoroscopy, faint calcification was seen in  the distribution of the left main, proximal LAD and proximal right  coronary artery.  1. Left main normal. It trifurcated.  2. Circumflex. There was ostial 80% narrowing of the circumflex as it  came off the left main. The mid and distal vessel were free of  disease.  3. Ramus intermediate (optional diagonal) moderate size and normal.  4. Left anterior descending. The LAD extended down to the apex of the  heart. The distal LAD was free of disease. The mid portion of the  LAD had an area of 30-40% narrowing at the takeoff of first  diagonal. The first diagonal had an 85% area of ostial narrowing.  5. Right coronary artery. The right coronary artery gave rise to the  PDA. This vessel was free of disease. Several injections of the  cusp did not show any evidence of ostial narrowing.  CONCLUSION:  1. High-grade ostial narrowing of the circumflex at the left main.  2. Ostial narrowing of the first diagonal at the take-off of the  LAD.  3. Left ventricular hypertrophy.  4. Normal LV systolic function.  It appears this lady will need bypass surgery because of the ostial  circumflex lesion. I plan to ask my colleagues for their opinion before  we refer her to CVTS. She will be restarted on IV heparin in about 3  hours following her cath. I have temporarily stopped her Coumadin.    03/2015 Echo Study Conclusions  - Left ventricle: The cavity size was normal. Wall thickness was  increased in a pattern of severe LVH. Systolic function was  normal. The estimated ejection fraction was in the range of 60%  to 65%. Wall motion was normal; there were no regional wall  motion abnormalities. - Aortic valve: Valve area (VTI): 1.28 cm^2. Valve area (Vmax):  1.31 cm^2. Valve area (Vmean): 1.26 cm^2. - Mitral valve: Thickening and calcification of the basal leaflets.  Calcified annulus. There was mild regurgitation. - Left atrium: The atrium was moderately dilated. - Atrial septum: No defect or patent foramen ovale was identified. - Pulmonary arteries: PA peak pressure: 39 mm Hg (S). - Pericardium, extracardiac: Small mostly posterior pericardial  effusion no tamponade.   Assessment and Plan   1. CAD  - no recent symptoms - continue current meds  2. Parox afib  - no recent episodes. Has not been on anticoag from her prior cardiologist, I presume due to no detected recurrence of afib after her DCCV in 2012 -  she will continue current meds  3. HTN  - she is at goal,  will continue current meds   4. Hyperlipidemia  - muscle aches on daily statin, tolerating every other day dosing. - continue current dosing. Request labs from pcp    F/u 6 months     Antoine Poche, M.D.

## 2015-09-21 NOTE — Patient Instructions (Signed)
Medication Instructions:  Your physician recommends that you continue on your current medications as directed. Please refer to the Current Medication list given to you today.   Labwork: I will request labs from your PCP  Testing/Procedures: NONE  Follow-Up: Your physician wants you to follow-up in: 6  MONTHS.  You will receive a reminder letter in the mail two months in advance. If you don't receive a letter, please call our office to schedule the follow-up appointment.   Any Other Special Instructions Will Be Listed Below (If Applicable).     If you need a refill on your cardiac medications before your next appointment, please call your pharmacy.

## 2015-12-22 ENCOUNTER — Other Ambulatory Visit: Payer: Self-pay | Admitting: Cardiology

## 2015-12-27 ENCOUNTER — Encounter (HOSPITAL_COMMUNITY): Payer: Self-pay | Admitting: Emergency Medicine

## 2015-12-27 ENCOUNTER — Inpatient Hospital Stay (HOSPITAL_COMMUNITY): Payer: Medicare Other

## 2015-12-27 ENCOUNTER — Emergency Department (HOSPITAL_COMMUNITY): Payer: Medicare Other

## 2015-12-27 ENCOUNTER — Inpatient Hospital Stay (HOSPITAL_COMMUNITY)
Admission: EM | Admit: 2015-12-27 | Discharge: 2016-01-07 | DRG: 271 | Disposition: A | Discharge status: Home Health | Payer: Medicare Other | Attending: Internal Medicine | Admitting: Internal Medicine

## 2015-12-27 DIAGNOSIS — I2510 Atherosclerotic heart disease of native coronary artery without angina pectoris: Secondary | ICD-10-CM | POA: Diagnosis present

## 2015-12-27 DIAGNOSIS — I340 Nonrheumatic mitral (valve) insufficiency: Secondary | ICD-10-CM | POA: Diagnosis present

## 2015-12-27 DIAGNOSIS — I130 Hypertensive heart and chronic kidney disease with heart failure and stage 1 through stage 4 chronic kidney disease, or unspecified chronic kidney disease: Secondary | ICD-10-CM | POA: Diagnosis present

## 2015-12-27 DIAGNOSIS — I96 Gangrene, not elsewhere classified: Secondary | ICD-10-CM | POA: Diagnosis present

## 2015-12-27 DIAGNOSIS — N183 Chronic kidney disease, stage 3 (moderate): Secondary | ICD-10-CM | POA: Diagnosis present

## 2015-12-27 DIAGNOSIS — Z66 Do not resuscitate: Secondary | ICD-10-CM | POA: Diagnosis present

## 2015-12-27 DIAGNOSIS — Z7982 Long term (current) use of aspirin: Secondary | ICD-10-CM

## 2015-12-27 DIAGNOSIS — M86172 Other acute osteomyelitis, left ankle and foot: Secondary | ICD-10-CM

## 2015-12-27 DIAGNOSIS — M86071 Acute hematogenous osteomyelitis, right ankle and foot: Secondary | ICD-10-CM | POA: Diagnosis not present

## 2015-12-27 DIAGNOSIS — I252 Old myocardial infarction: Secondary | ICD-10-CM | POA: Diagnosis not present

## 2015-12-27 DIAGNOSIS — E785 Hyperlipidemia, unspecified: Secondary | ICD-10-CM | POA: Diagnosis present

## 2015-12-27 DIAGNOSIS — R011 Cardiac murmur, unspecified: Secondary | ICD-10-CM | POA: Diagnosis not present

## 2015-12-27 DIAGNOSIS — B957 Other staphylococcus as the cause of diseases classified elsewhere: Secondary | ICD-10-CM | POA: Diagnosis present

## 2015-12-27 DIAGNOSIS — M86179 Other acute osteomyelitis, unspecified ankle and foot: Secondary | ICD-10-CM | POA: Diagnosis present

## 2015-12-27 DIAGNOSIS — I998 Other disorder of circulatory system: Secondary | ICD-10-CM | POA: Diagnosis not present

## 2015-12-27 DIAGNOSIS — E039 Hypothyroidism, unspecified: Secondary | ICD-10-CM | POA: Diagnosis present

## 2015-12-27 DIAGNOSIS — I70261 Atherosclerosis of native arteries of extremities with gangrene, right leg: Secondary | ICD-10-CM | POA: Diagnosis present

## 2015-12-27 DIAGNOSIS — I10 Essential (primary) hypertension: Secondary | ICD-10-CM | POA: Diagnosis not present

## 2015-12-27 DIAGNOSIS — I739 Peripheral vascular disease, unspecified: Secondary | ICD-10-CM

## 2015-12-27 DIAGNOSIS — E7800 Pure hypercholesterolemia, unspecified: Secondary | ICD-10-CM | POA: Diagnosis not present

## 2015-12-27 DIAGNOSIS — Z79899 Other long term (current) drug therapy: Secondary | ICD-10-CM

## 2015-12-27 DIAGNOSIS — I519 Heart disease, unspecified: Secondary | ICD-10-CM | POA: Diagnosis present

## 2015-12-27 DIAGNOSIS — M84477A Pathological fracture, right toe(s), initial encounter for fracture: Secondary | ICD-10-CM | POA: Diagnosis present

## 2015-12-27 DIAGNOSIS — L089 Local infection of the skin and subcutaneous tissue, unspecified: Secondary | ICD-10-CM

## 2015-12-27 DIAGNOSIS — M86271 Subacute osteomyelitis, right ankle and foot: Secondary | ICD-10-CM | POA: Diagnosis not present

## 2015-12-27 DIAGNOSIS — I480 Paroxysmal atrial fibrillation: Secondary | ICD-10-CM | POA: Diagnosis present

## 2015-12-27 DIAGNOSIS — N179 Acute kidney failure, unspecified: Secondary | ICD-10-CM | POA: Diagnosis present

## 2015-12-27 DIAGNOSIS — R059 Cough, unspecified: Secondary | ICD-10-CM

## 2015-12-27 DIAGNOSIS — R7881 Bacteremia: Secondary | ICD-10-CM

## 2015-12-27 DIAGNOSIS — M86171 Other acute osteomyelitis, right ankle and foot: Secondary | ICD-10-CM | POA: Diagnosis not present

## 2015-12-27 DIAGNOSIS — K219 Gastro-esophageal reflux disease without esophagitis: Secondary | ICD-10-CM | POA: Diagnosis present

## 2015-12-27 DIAGNOSIS — R05 Cough: Secondary | ICD-10-CM

## 2015-12-27 DIAGNOSIS — M869 Osteomyelitis, unspecified: Secondary | ICD-10-CM | POA: Diagnosis present

## 2015-12-27 DIAGNOSIS — R262 Difficulty in walking, not elsewhere classified: Secondary | ICD-10-CM

## 2015-12-27 DIAGNOSIS — I313 Pericardial effusion (noninflammatory): Secondary | ICD-10-CM | POA: Diagnosis not present

## 2015-12-27 LAB — LACTIC ACID, PLASMA
LACTIC ACID, VENOUS: 1.3 mmol/L (ref 0.5–1.9)
LACTIC ACID, VENOUS: 1.3 mmol/L (ref 0.5–1.9)

## 2015-12-27 LAB — PROTIME-INR
INR: 1.15
PROTHROMBIN TIME: 14.7 s (ref 11.4–15.2)

## 2015-12-27 LAB — BASIC METABOLIC PANEL
Anion gap: 11 (ref 5–15)
BUN: 25 mg/dL — ABNORMAL HIGH (ref 6–20)
CHLORIDE: 101 mmol/L (ref 101–111)
CO2: 28 mmol/L (ref 22–32)
Calcium: 8.8 mg/dL — ABNORMAL LOW (ref 8.9–10.3)
Creatinine, Ser: 1.42 mg/dL — ABNORMAL HIGH (ref 0.44–1.00)
GFR, EST AFRICAN AMERICAN: 36 mL/min — AB (ref >60)
GFR, EST NON AFRICAN AMERICAN: 31 mL/min — AB (ref >60)
Glucose, Bld: 92 mg/dL (ref 65–99)
POTASSIUM: 4.5 mmol/L (ref 3.5–5.1)
SODIUM: 140 mmol/L (ref 135–145)

## 2015-12-27 LAB — CBC WITH DIFFERENTIAL/PLATELET
BASOS ABS: 0 10*3/uL (ref 0.0–0.1)
BASOS PCT: 0 %
EOS ABS: 0 10*3/uL (ref 0.0–0.7)
Eosinophils Relative: 0 %
HCT: 35.2 % — ABNORMAL LOW (ref 36.0–46.0)
HEMOGLOBIN: 11.5 g/dL — AB (ref 12.0–15.0)
Lymphocytes Relative: 9 %
Lymphs Abs: 1 10*3/uL (ref 0.7–4.0)
MCH: 31.8 pg (ref 26.0–34.0)
MCHC: 32.7 g/dL (ref 30.0–36.0)
MCV: 97.2 fL (ref 78.0–100.0)
Monocytes Absolute: 1 10*3/uL (ref 0.1–1.0)
Monocytes Relative: 10 %
NEUTROS PCT: 81 %
Neutro Abs: 8.3 10*3/uL — ABNORMAL HIGH (ref 1.7–7.7)
Platelets: 418 10*3/uL — ABNORMAL HIGH (ref 150–400)
RBC: 3.62 MIL/uL — AB (ref 3.87–5.11)
RDW: 12.9 % (ref 11.5–15.5)
WBC: 10.3 10*3/uL (ref 4.0–10.5)

## 2015-12-27 LAB — BRAIN NATRIURETIC PEPTIDE: B NATRIURETIC PEPTIDE 5: 270 pg/mL — AB (ref 0.0–100.0)

## 2015-12-27 MED ORDER — SODIUM CHLORIDE 0.9 % IV BOLUS (SEPSIS)
1000.0000 mL | Freq: Once | INTRAVENOUS | Status: AC
  Administered 2015-12-27: 1000 mL via INTRAVENOUS

## 2015-12-27 MED ORDER — SODIUM CHLORIDE 0.9 % IV SOLN
INTRAVENOUS | Status: DC
  Administered 2015-12-27: 17:00:00 via INTRAVENOUS

## 2015-12-27 MED ORDER — VANCOMYCIN HCL IN DEXTROSE 1-5 GM/200ML-% IV SOLN
1000.0000 mg | Freq: Once | INTRAVENOUS | Status: AC
  Administered 2015-12-27: 1000 mg via INTRAVENOUS
  Filled 2015-12-27: qty 200

## 2015-12-27 MED ORDER — AMLODIPINE BESYLATE 5 MG PO TABS
2.5000 mg | ORAL_TABLET | Freq: Every day | ORAL | Status: DC
  Administered 2015-12-28 – 2016-01-07 (×10): 2.5 mg via ORAL
  Filled 2015-12-27 (×10): qty 1

## 2015-12-27 MED ORDER — PANTOPRAZOLE SODIUM 40 MG PO TBEC
40.0000 mg | DELAYED_RELEASE_TABLET | Freq: Every day | ORAL | Status: DC
Start: 2015-12-27 — End: 2016-01-07
  Administered 2015-12-27 – 2016-01-07 (×10): 40 mg via ORAL
  Filled 2015-12-27 (×10): qty 1

## 2015-12-27 MED ORDER — ONDANSETRON HCL 4 MG/2ML IJ SOLN
4.0000 mg | Freq: Four times a day (QID) | INTRAMUSCULAR | Status: DC | PRN
  Filled 2015-12-27: qty 2

## 2015-12-27 MED ORDER — VITAMIN B-12 1000 MCG PO TABS
1000.0000 ug | ORAL_TABLET | Freq: Every day | ORAL | Status: DC
  Administered 2015-12-28 – 2016-01-07 (×9): 1000 ug via ORAL
  Filled 2015-12-27 (×11): qty 1

## 2015-12-27 MED ORDER — PIPERACILLIN-TAZOBACTAM 3.375 G IVPB
3.3750 g | Freq: Three times a day (TID) | INTRAVENOUS | Status: DC
  Administered 2015-12-27 – 2015-12-31 (×10): 3.375 g via INTRAVENOUS
  Filled 2015-12-27 (×9): qty 50

## 2015-12-27 MED ORDER — ASPIRIN EC 81 MG PO TBEC
81.0000 mg | DELAYED_RELEASE_TABLET | Freq: Every day | ORAL | Status: DC
  Administered 2015-12-28 – 2016-01-07 (×9): 81 mg via ORAL
  Filled 2015-12-27 (×9): qty 1

## 2015-12-27 MED ORDER — HYDROCODONE-ACETAMINOPHEN 5-325 MG PO TABS: 1.0000 | ORAL_TABLET | Freq: Four times a day (QID) | ORAL | Status: DC | PRN

## 2015-12-27 MED ORDER — ONDANSETRON HCL 4 MG PO TABS
4.0000 mg | ORAL_TABLET | Freq: Four times a day (QID) | ORAL | Status: DC | PRN
Start: 2015-12-27 — End: 2016-01-07

## 2015-12-27 MED ORDER — METOPROLOL TARTRATE 5 MG/5ML IV SOLN: 5.0000 mg | INTRAVENOUS | Status: DC | PRN

## 2015-12-27 MED ORDER — HEPARIN SODIUM (PORCINE) 5000 UNIT/ML IJ SOLN
5000.0000 [IU] | Freq: Three times a day (TID) | INTRAMUSCULAR | Status: DC
  Administered 2015-12-27 – 2016-01-07 (×23): 5000 [IU] via SUBCUTANEOUS
  Filled 2015-12-27 (×21): qty 1

## 2015-12-27 MED ORDER — ALBUTEROL SULFATE (2.5 MG/3ML) 0.083% IN NEBU: 2.5000 mg | INHALATION_SOLUTION | RESPIRATORY_TRACT | Status: DC | PRN

## 2015-12-27 MED ORDER — PIPERACILLIN-TAZOBACTAM 3.375 G IVPB 30 MIN
3.3750 g | Freq: Once | INTRAVENOUS | Status: AC
  Administered 2015-12-27: 3.375 g via INTRAVENOUS
  Filled 2015-12-27: qty 50

## 2015-12-27 MED ORDER — AMIODARONE HCL 200 MG PO TABS
100.0000 mg | ORAL_TABLET | Freq: Every day | ORAL | Status: DC
  Administered 2015-12-28 – 2016-01-07 (×10): 100 mg via ORAL
  Filled 2015-12-27 (×12): qty 1

## 2015-12-27 MED ORDER — VANCOMYCIN HCL 500 MG IV SOLR
500.0000 mg | INTRAVENOUS | Status: DC
  Filled 2015-12-27 (×2): qty 500

## 2015-12-27 MED ORDER — POLYETHYLENE GLYCOL 3350 17 G PO PACK
17.0000 g | PACK | Freq: Two times a day (BID) | ORAL | Status: DC
  Administered 2015-12-27 – 2016-01-07 (×11): 17 g via ORAL
  Filled 2015-12-27 (×16): qty 1

## 2015-12-27 MED ORDER — HYDRALAZINE HCL 20 MG/ML IJ SOLN
10.0000 mg | Freq: Four times a day (QID) | INTRAMUSCULAR | Status: DC | PRN
  Administered 2015-12-30 – 2016-01-04 (×3): 10 mg via INTRAVENOUS
  Filled 2015-12-27 (×3): qty 1

## 2015-12-27 MED ORDER — LEVOTHYROXINE SODIUM 75 MCG PO TABS
75.0000 ug | ORAL_TABLET | Freq: Every day | ORAL | Status: DC
  Administered 2015-12-28 – 2016-01-07 (×10): 75 ug via ORAL
  Filled 2015-12-27 (×11): qty 1

## 2015-12-27 MED ORDER — ATORVASTATIN CALCIUM 20 MG PO TABS
20.0000 mg | ORAL_TABLET | ORAL | Status: DC
  Administered 2015-12-28 – 2016-01-07 (×5): 20 mg via ORAL
  Filled 2015-12-27 (×7): qty 1

## 2015-12-27 NOTE — ED Provider Notes (Signed)
[facility]Y DEPT Provider Note   CSN: [id number] Arrival date & time: 12/27/15  1038     History   Chief Complaint Chief Complaint  Patient presents with  . Toe Injury    HPI [name] is a 82 y.o. female with a past medical history significant for CAD, hypertension, hyperlipidemia, and atrial fibrillation who presents with toe infection. Patient reports that her right great toe was cut while trimming her nails last week. She says that his gradually begun to [place] however over the last 24 hours, it has turned black, foul-smelling, purulence is present, and now there is redness creeping upward past her ankle. Patient denies pain but says the toe is numb compared to left side. She denies any fevers, chills, chest pain, shortness breath, nausea, vomiting, constipation, diarrhea, dysuria. Patient has been continued to walk on it with her cane. Family evaluated the foot today and instructed her to seek evaluation due to appearance. Patient denies a history of skin infections, diabetes, or recent antibiotic use. Patient does report that when her leg swell, she uses a fluid pill. She says her bilateral legs were slightly swollen over the last 2 weeks.    The history is provided by the patient, a relative and medical records. No language interpreter was used.  Foot Injury   The incident occurred more than 1 week ago. The incident occurred at home. Injury mechanism: toe nail clipping. The pain is present in the right toes. The quality of the pain is described as aching. The pain is mild. The pain has been constant since onset. Associated symptoms include numbness and loss of sensation. Pertinent negatives include no inability to bear weight and no muscle weakness. The symptoms are aggravated by palpation. She has tried nothing for the symptoms. The treatment provided no relief.      Past Medical History:  Diagnosis Date  . Acid reflux   . Carotid artery occlusion    minimal  .  Coronary artery disease   . Hypercholesteremia   . Hypertension   . Myocardial infarct   . PAF (paroxysmal atrial fibrillation) (HCC)   . Skin disorder   . Systolic murmur   . Thyroid disease     Patient Active Problem List   Diagnosis Date Noted  . CAD (coronary artery disease) 03/26/2013  . HTN (hypertension) 03/26/2013  . Hyperlipidemia 03/26/2013  . Heart murmur 03/26/2013    Past Surgical History:  Procedure Laterality Date  . ABDOMINAL HYSTERECTOMY    . CARDIAC CATHETERIZATION    . CARDIOVERSION  2012  . THYROID SURGERY      OB History    No data available       Home Medications    Prior to Admission medications   Medication Sig Start Date End Date Taking? Authorizing Provider  amiodarone (PACERONE) 200 MG tablet Take 0.5 tablets (100 mg total) by mouth daily. 08/08/12   Susa Griffins, MD  amLODipine (NORVASC) 2.5 MG tablet Take 2.5 mg by mouth daily. 08/26/14   Historical Provider, MD  aspirin EC 81 MG tablet Take 1 tablet (81 mg total) by mouth daily. 03/26/13   Antoine Poche, MD  atorvastatin (LIPITOR) 20 MG tablet Take 1 tablet (20 mg total) by mouth every other day. Take one tablet by mouth once daily. 03/30/14   Jodelle Gross, NP  donepezil (ARICEPT) 5 MG tablet Take 5 mg by mouth at bedtime. 09/17/15   Historical Provider, MD  furosemide (LASIX) 20 MG tablet TAKE ONE  TABLET BY MOUTH EVERY DAY AS NEEDED FOR SWELLING. 12/22/15   Antoine PocheJonathan F Branch, MD  levothyroxine (SYNTHROID, LEVOTHROID) 75 MCG tablet Take 75 mcg by mouth daily.      Historical Provider, MD  omeprazole (PRILOSEC) 20 MG capsule Take 20 mg by mouth 2 (two) times daily before a meal.     Historical Provider, MD  vitamin B-12 (CYANOCOBALAMIN) 1000 MCG tablet Take 1,000 mcg by mouth daily.    Historical Provider, MD    Family History Family History  Problem Relation Age of Onset  . Family history unknown: Yes    Social History Social History  Substance Use Topics  . Smoking  status: Never Smoker  . Smokeless tobacco: Never Used  . Alcohol use No     Allergies   Patient has no known allergies.   Review of Systems Review of Systems  Constitutional: Negative for activity change, chills, diaphoresis, fatigue and fever.  HENT: Negative for congestion and rhinorrhea.   Eyes: Negative for visual disturbance.  Respiratory: Negative for cough, chest tightness, shortness of breath and stridor.   Cardiovascular: Positive for leg swelling (last 2 weeks). Negative for chest pain and palpitations.  Gastrointestinal: Negative for abdominal distention, abdominal pain, constipation, diarrhea, nausea and vomiting.  Genitourinary: Negative for difficulty urinating, dysuria, flank pain, frequency, hematuria, menstrual problem, pelvic pain, vaginal bleeding and vaginal discharge.  Musculoskeletal: Negative for back pain and neck pain.  Skin: Positive for color change, rash and wound.  Neurological: Positive for numbness. Negative for dizziness, weakness, light-headedness and headaches.  Psychiatric/Behavioral: Negative for agitation and confusion.  All other systems reviewed and are negative.    Physical Exam Updated Vital Signs BP 146/79 (BP Location: Left Arm)   Pulse (!) 59   Temp 97.6 F (36.4 C) (Oral)   Resp 18   Ht 5\' 5"  (1.651 m)   Wt 115 lb (52.2 kg)   SpO2 100%   BMI 19.14 kg/m   Physical Exam  Constitutional: She appears well-developed and well-nourished. No distress.  HENT:  Head: Normocephalic and atraumatic.  Eyes: Conjunctivae are normal.  Neck: Neck supple.  Cardiovascular: Normal rate and regular rhythm.   No murmur heard. Pulmonary/Chest: Effort normal and breath sounds normal. No respiratory distress. She has no wheezes. She exhibits no tenderness.  Abdominal: Soft. There is no tenderness.  Musculoskeletal: She exhibits tenderness. She exhibits no edema.       Right foot: There is tenderness and swelling. There is normal range of motion  and no crepitus.       Feet:  See clinical photos.   Neurological: She is alert. A sensory deficit is present. No cranial nerve deficit. She exhibits normal muscle tone.  Skin: Skin is warm and dry. Rash noted. There is erythema.  Psychiatric: She has a normal mood and affect.  Nursing note and vitals reviewed.          ED Treatments / Results  Labs (all labs ordered are listed, but only abnormal results are displayed) Labs Reviewed  CBC WITH DIFFERENTIAL/PLATELET - Abnormal; Notable for the following:       Result Value   RBC 3.62 (*)    Hemoglobin 11.5 (*)    HCT 35.2 (*)    Platelets 418 (*)    Neutro Abs 8.3 (*)    All other components within normal limits  BASIC METABOLIC PANEL - Abnormal; Notable for the following:    BUN 25 (*)    Creatinine, Ser 1.42 (*)  Calcium 8.8 (*)    GFR calc non Af Amer 31 (*)    GFR calc Af Amer 36 (*)    All other components within normal limits  BRAIN NATRIURETIC PEPTIDE - Abnormal; Notable for the following:    B Natriuretic Peptide 270.0 (*)    All other components within normal limits  CULTURE, BLOOD (ROUTINE X 2)  CULTURE, BLOOD (ROUTINE X 2)  LACTIC ACID, PLASMA  LACTIC ACID, PLASMA    EKG  EKG Interpretation None       Radiology Dg Foot Complete Right  Result Date: 12/27/2015 CLINICAL DATA:  The patient tried her great toenail her of this week with onset of swelling, wound in bowel odor. The toe is discolored. Initial encounter. EXAM: RIGHT FOOT COMPLETE - 3+ VIEW COMPARISON:  None. FINDINGS: Soft tissues of the great toe are markedly swollen. There is bony destructive change throughout the distal phalanx a comminuted fracture that is transverse in orientation through the metaphysis and extends the articular surface. Bones are osteopenic. No other acute bony abnormality is seen. IMPRESSION: Findings most consistent with osteomyelitis and pathologic fracture of the distal phalanx of the right great toe. Electronically  Signed   By: Drusilla Kanner M.D.   On: 12/27/2015 13:17    Procedures Procedures (including critical care time)  Medications Ordered in ED Medications  pantoprazole (PROTONIX) EC tablet 40 mg (not administered)  levothyroxine (SYNTHROID, LEVOTHROID) tablet 75 mcg (not administered)  amiodarone (PACERONE) tablet 100 mg (not administered)  aspirin EC tablet 81 mg (not administered)  atorvastatin (LIPITOR) tablet 20 mg (not administered)  amLODipine (NORVASC) tablet 2.5 mg (not administered)  vitamin B-12 (CYANOCOBALAMIN) tablet 1,000 mcg (not administered)  0.9 %  sodium chloride infusion (not administered)  hydrALAZINE (APRESOLINE) injection 10 mg (not administered)  metoprolol (LOPRESSOR) injection 5 mg (not administered)  albuterol (PROVENTIL) (2.5 MG/3ML) 0.083% nebulizer solution 2.5 mg (not administered)  polyethylene glycol (MIRALAX / GLYCOLAX) packet 17 g (not administered)  HYDROcodone-acetaminophen (NORCO/VICODIN) 5-325 MG per tablet 1 tablet (not administered)  ondansetron (ZOFRAN) tablet 4 mg (not administered)    Or  ondansetron (ZOFRAN) injection 4 mg (not administered)  heparin injection 5,000 Units (not administered)  piperacillin-tazobactam (ZOSYN) IVPB 3.375 g (not administered)  vancomycin (VANCOCIN) 500 mg in sodium chloride 0.9 % 100 mL IVPB (not administered)  sodium chloride 0.9 % bolus 1,000 mL (0 mLs Intravenous Stopped 12/27/15 1447)  piperacillin-tazobactam (ZOSYN) IVPB 3.375 g (0 g Intravenous Stopped 12/27/15 1311)  vancomycin (VANCOCIN) IVPB 1000 mg/200 mL premix (0 mg Intravenous Stopped 12/27/15 1339)     Initial Impression / Assessment and Plan / ED Course  I have reviewed the triage vital signs and the nursing notes.  Pertinent labs & imaging results that were available during my care of the patient were reviewed by me and considered in my medical decision making (see chart for details).  Clinical Course    [name] is a 82 y.o. female  with a past medical history significant for CAD, hypertension, hyperlipidemia, and atrial fibrillation who presents with toe infection.  History and exam are seen above.  On exam, patient has erythema and edema in her right foot. The swelling extends proximal to the ankle towards the knee. There is also some warmth and erythema distal to the knee. Patient has no pain in her knee and no pain in her ankle. Patient has black color and foul-smell on the right great toe. Patient has decreased sensation in the right great toe.  The toenail fell off today prompting him to seek evaluation. Patient has minimal tenderness but there is evidence of purulence in the toe.  Lungs are clear, abdomen is nontender, no other abnormalities are seen on exam.  Based on appearance, there is clinical concern for a gangrenous infection of the great toe. Patient will have x-rays obtained to look for subcutaneous air. Patient will also have cultures, labs, and broad-spectrum antibiotics will be initiated. Gentle fluids will be started given patient's history of fluid retention requiring Lasix. Suspect patient will need admission for her foot infection.  1:51 PM Diagnostic imaging study returned showing evidence of osteo-myelitis with pathologic fracture.  Lactic acid nonelevated, no leukocytosis, creatinine elevated to 1.42.  Patient will be admitted for further management of her foot infection with concern for osteomyelitis.  General surgery called and they will see the patient after admission. Patient admitted in stable condition for further management.   Final Clinical Impressions(s) / ED Diagnoses   Final diagnoses:  Toe infection  Osteomyelitis of right foot, unspecified type (HCC)     Clinical Impression: 1. Toe infection   2. Osteomyelitis of right foot, unspecified type (HCC)   3. Gangrene of foot (HCC)   4. Cough     Disposition: Admit to Hospitalist service    Heide Scaleshristopher J Teiana Hajduk, MD 12/27/15  1531

## 2015-12-27 NOTE — ED Triage Notes (Signed)
Pt reports cutting right great toenail and then nail fell off and toe turned black with bad odor.

## 2015-12-27 NOTE — H&P (Signed)
TRH H&P   Patient Demographics:    [name], is a 82 y.o. female  MRN: [id number]   DOB - 06/23/1920  Admit Date - 12/27/2015  Outpatient Primary MD for the patient is Colette RibasGOLDING, JOHN CABOT, MD  Outpatient Specialists: Dr [name]   Patient coming from: Home  Chief Complaint  Patient presents with  . Toe Injury      HPI:    [name]  is a 82 y.o. female, With history of mitral valve regurgitation, CAD with obstructive disease but on medical treatment, chronic diastolic dysfunction last EF around 65%, paroxysmal atrial fibrillation not on anticoagulation due to fall risk and age, [place] 2 score of at least 4, hypertension, dyslipidemia who lives at home and walks with a cane comes to the ER after she neck her right big toe one week ago while cutting her nails. Since then the toe has gradually gotten black in color and today her nail fell off, came to the ER where her right big toe obviously looks gangrenous, x-ray revealed underlying osteomyelitis and distal phalanx fracture. Patient herself was nontoxic, not septic, presently had no pain, no fevers, no leukocytosis I was called to admit the patient for right big toe gangrene. She is relatively symptom-free and in good spirits.    Review of systems:    In addition to the HPI above, Currently review of systems negative except right toe being black and swollen. No Fever-chills, No Headache, No changes with Vision or hearing, No problems swallowing food or Liquids, No Chest pain, Cough or Shortness of Breath, No Abdominal pain, No Nausea or Vommitting, Bowel movements are regular, No Blood in stool or Urine, No dysuria, No new skin rashes or bruises, No  new joints pains-aches,  No new weakness, tingling, numbness in any extremity, No recent weight gain or loss, No polyuria, polydypsia or polyphagia, No significant Mental Stressors.  A full 10 point Review of Systems was done, except as stated above, all other Review of Systems were negative.   With Past History of the following :    Past Medical History:  Diagnosis Date  . Acid reflux   . Carotid artery occlusion    minimal  . Coronary artery disease   . Hypercholesteremia   . Hypertension   . Myocardial infarct   . PAF (paroxysmal  atrial fibrillation) (HCC)   . Skin disorder   . Systolic murmur   . Thyroid disease       Past Surgical History:  Procedure Laterality Date  . ABDOMINAL HYSTERECTOMY    . CARDIAC CATHETERIZATION    . CARDIOVERSION  2012  . THYROID SURGERY        Social History:     Social History  Substance Use Topics  . Smoking status: Never Smoker  . Smokeless tobacco: Never Used  . Alcohol use No         Family History :     Family History  Problem Relation Age of Onset  . Family history - Lung cancer in sister        Home Medications:   Prior to Admission medications   Medication Sig Start Date End Date Taking? Authorizing Provider  amiodarone (PACERONE) 200 MG tablet Take 0.5 tablets (100 mg total) by mouth daily. 08/08/12  Yes Susa Griffinsichard Weintraub, MD  amLODipine (NORVASC) 2.5 MG tablet Take 2.5 mg by mouth daily. 08/26/14  Yes Historical Provider, MD  aspirin EC 81 MG tablet Take 1 tablet (81 mg total) by mouth daily. 03/26/13  Yes Antoine PocheJonathan F Branch, MD  atorvastatin (LIPITOR) 20 MG tablet Take 1 tablet (20 mg total) by mouth every other day. Take one tablet by mouth once daily. 03/30/14  Yes Jodelle GrossKathryn M Lawrence, NP  furosemide (LASIX) 20 MG tablet TAKE ONE TABLET BY MOUTH EVERY DAY AS NEEDED FOR SWELLING. 12/22/15  Yes Antoine PocheJonathan F Branch, MD  levothyroxine (SYNTHROID, LEVOTHROID) 75 MCG tablet Take 75 mcg by mouth daily.     Yes Historical  Provider, MD  omeprazole (PRILOSEC) 20 MG capsule Take 40 mg by mouth daily.    Yes Historical Provider, MD  vitamin B-12 (CYANOCOBALAMIN) 1000 MCG tablet Take 1,000 mcg by mouth daily.   Yes Historical Provider, MD     Allergies:    No Known Allergies   Physical Exam:   Vitals  Blood pressure 166/58, pulse (!) 55, temperature 97.6 F (36.4 C), temperature source Oral, resp. rate 23, height 5\' 5"  (1.651 m), weight 52.2 kg (115 lb), SpO2 100 %.   1. General Pleasant elderly black female lying in bed in NAD,     2. Normal affect and insight, Not Suicidal or Homicidal, Awake Alert, Oriented X 3.  3. No F.N deficits, ALL C.Nerves Intact, Strength 5/5 all 4 extremities, Sensation intact all 4 extremities, Plantars down going.  4. Ears and Eyes appear Normal, Conjunctivae clear, PERRLA. Moist Oral Mucosa.  5. Supple Neck, No JVD, No cervical lymphadenopathy appriciated, No Carotid Bruits.  6. Symmetrical Chest wall movement, Good air movement bilaterally, CTAB.  7. RRR, No Gallops, Rubs , soft mitral valve systolic Murmurs, No Parasternal Heave.  8. Positive Bowel Sounds, Abdomen Soft, No tenderness, No organomegaly appriciated,No rebound -guarding or rigidity.  9.  No Cyanosis, Normal Skin Turgor, No Skin Rash or Bruise.  10. Good muscle tone,  joints appear normal , no effusions, Normal ROM.  11. No Palpable Lymph Nodes in Neck or Axillae  Rt Toe as below      Data Review:    CBC  Recent Labs Lab 12/27/15 1225  WBC 10.3  HGB 11.5*  HCT 35.2*  PLT 418*  MCV 97.2  MCH 31.8  MCHC 32.7  RDW 12.9  LYMPHSABS 1.0  MONOABS 1.0  EOSABS 0.0  BASOSABS 0.0   ------------------------------------------------------------------------------------------------------------------  Chemistries   Recent Labs Lab 12/27/15 1225  NA  140  K 4.5  CL 101  CO2 28  GLUCOSE 92  BUN 25*  CREATININE 1.42*  CALCIUM 8.8*    ------------------------------------------------------------------------------------------------------------------ estimated creatinine clearance is 20.4 mL/min (by C-G formula based on SCr of 1.42 mg/dL (H)). ------------------------------------------------------------------------------------------------------------------ No results for input(s): TSH, T4TOTAL, T3FREE, THYROIDAB in the last 72 hours.  Invalid input(s): FREET3  Coagulation profile No results for input(s): INR, PROTIME in the last 168 hours. ------------------------------------------------------------------------------------------------------------------- No results for input(s): DDIMER in the last 72 hours. -------------------------------------------------------------------------------------------------------------------  Cardiac Enzymes No results for input(s): CKMB, TROPONINI, MYOGLOBIN in the last 168 hours.  Invalid input(s): CK ------------------------------------------------------------------------------------------------------------------    Component Value Date/Time   BNP 270.0 (H) 12/27/2015 1226     ---------------------------------------------------------------------------------------------------------------  Urinalysis No results found for: COLORURINE, APPEARANCEUR, LABSPEC, PHURINE, GLUCOSEU, HGBUR, BILIRUBINUR, KETONESUR, PROTEINUR, UROBILINOGEN, NITRITE, LEUKOCYTESUR  ----------------------------------------------------------------------------------------------------------------   Imaging Results:    Dg Foot Complete Right  Result Date: 12/27/2015 CLINICAL DATA:  The patient tried her great toenail her of this week with onset of swelling, wound in bowel odor. The toe is discolored. Initial encounter. EXAM: RIGHT FOOT COMPLETE - 3+ VIEW COMPARISON:  None. FINDINGS: Soft tissues of the great toe are markedly swollen. There is bony destructive change throughout the distal phalanx a comminuted  fracture that is transverse in orientation through the metaphysis and extends the articular surface. Bones are osteopenic. No other acute bony abnormality is seen. IMPRESSION: Findings most consistent with osteomyelitis and pathologic fracture of the distal phalanx of the right great toe. Electronically Signed   By: Drusilla Kanner M.D.   On: 12/27/2015 13:17    My personal review of EKG: Rhythm NSR, Rate  58 /min,  no Acute ST changes   Assessment & Plan:   1. Right big toe gangrene with possible underlying right big toe osteomyelitis and distal phalanx pathological fracture. She is not septic or toxic at this time however looks like she is developing surrounding cellulitis, blood cultures drawn, place on IV vancomycin and Zosyn, check ABI, consult general surgery as she most likely will require right big toe amputation and may require transmetatarsal amputation as well, gentle IV fluids and monitor.  Patient will be moderate to high risk candidate for adverse cardiopulmonary outcome during the perioperative period, she still climbs one flight of stairs without any chest pain, she is fairly active, does have medically treated occlusive CAD and mitral valve regurgitation. Risks and benefits explained to the patient and son. Both accept the risks and want to proceed with surgery if needed.  2. CAD, mitral valve regurgitation, chronic diastolic dysfunction with EF 60-65%. Compensated from a heart standpoint, no rales, she does have swelling in her right foot but I think that's due to infection, monitor on aspirin, statin for secondary prevention. As needed IV Lopressor for heart rate over 100.  3. ARF. Due to #1 above. Gentle hydration and monitor.  4. Dyslipidemia on statin.  5. GERD. On PPI.   DVT Prophylaxis Heparin   AM Labs Ordered, also please review Full Orders  Family Communication: Admission, patients condition and plan of care including tests being ordered have been discussed with  the patient and Son who indicate understanding and agree with the plan and Code Status.  Code Status DO NOT RESUSCITATE  Likely DC to  to be decided  Condition GUARDED    Consults called: Surgery    Admission status: Inpatient    Time spent in minutes : 35   Susa Raring K M.D on 12/27/2015 at 2:22  PM  Between 7am to 7pm - Pager - [phone]. After 7pm go to [email or web address] - password [facility]  [facility] - Office  [phone]

## 2015-12-27 NOTE — Progress Notes (Addendum)
Pharmacy Antibiotic Note  Lamija Huston FoleyM Martin is a 82 y.o. female admitted on 12/27/2015 with cellulitis, gangrene of toe.  Pharmacy has been consulted for vancomycin and zosyn dosing. Vancomycin 1 gm and zosyn ordered in the ED  Plan: Cont zosyn 3.375 gm IV q8 hours Cont vancomycin 500 mg IV q24 hours F/u renal function, cultures and clinical course  Height: 5\' 5"  (165.1 cm) Weight: 115 lb (52.2 kg) IBW/kg (Calculated) : 57  Temp (24hrs), Avg:97.6 F (36.4 C), Min:97.6 F (36.4 C), Max:97.6 F (36.4 C)  No results for input(s): WBC, CREATININE, LATICACIDVEN, VANCOTROUGH, VANCOPEAK, VANCORANDOM, GENTTROUGH, GENTPEAK, GENTRANDOM, TOBRATROUGH, TOBRAPEAK, TOBRARND, AMIKACINPEAK, AMIKACINTROU, AMIKACIN in the last 168 hours.  CrCl cannot be calculated (Patient's most recent lab result is older than the maximum 21 days allowed.).    No Known Allergies  Thank you for allowing pharmacy to be a part of this patient's care.  Talbert CageSeay, Maudene Stotler Poteet 12/27/2015 1:03 PM

## 2015-12-27 NOTE — ED Notes (Signed)
Pt reports she cut her toenails earlier this week and began noticing some bleeding to R great toe. Erythema and edema noted to R foot, pt denies pain, foul smelling drainage and discoloration noted to toe. Denies fever, N/V/D.

## 2015-12-28 ENCOUNTER — Inpatient Hospital Stay (HOSPITAL_COMMUNITY): Payer: Medicare Other

## 2015-12-28 DIAGNOSIS — M86271 Subacute osteomyelitis, right ankle and foot: Secondary | ICD-10-CM

## 2015-12-28 DIAGNOSIS — R011 Cardiac murmur, unspecified: Secondary | ICD-10-CM

## 2015-12-28 LAB — CBC
HCT: 30.6 % — ABNORMAL LOW (ref 36.0–46.0)
Hemoglobin: 10 g/dL — ABNORMAL LOW (ref 12.0–15.0)
MCH: 31.4 pg (ref 26.0–34.0)
MCHC: 32.7 g/dL (ref 30.0–36.0)
MCV: 96.2 fL (ref 78.0–100.0)
PLATELETS: 382 10*3/uL (ref 150–400)
RBC: 3.18 MIL/uL — ABNORMAL LOW (ref 3.87–5.11)
RDW: 12.9 % (ref 11.5–15.5)
WBC: 7.1 10*3/uL (ref 4.0–10.5)

## 2015-12-28 LAB — BASIC METABOLIC PANEL
Anion gap: 7 (ref 5–15)
BUN: 19 mg/dL (ref 6–20)
CALCIUM: 8.1 mg/dL — AB (ref 8.9–10.3)
CO2: 25 mmol/L (ref 22–32)
CREATININE: 1.28 mg/dL — AB (ref 0.44–1.00)
Chloride: 106 mmol/L (ref 101–111)
GFR calc non Af Amer: 35 mL/min — ABNORMAL LOW (ref >60)
GFR, EST AFRICAN AMERICAN: 40 mL/min — AB (ref >60)
GLUCOSE: 98 mg/dL (ref 65–99)
Potassium: 3.9 mmol/L (ref 3.5–5.1)
Sodium: 138 mmol/L (ref 135–145)

## 2015-12-28 LAB — BLOOD CULTURE ID PANEL (REFLEXED)
ACINETOBACTER BAUMANNII: NOT DETECTED
CANDIDA GLABRATA: NOT DETECTED
CANDIDA KRUSEI: NOT DETECTED
Candida albicans: NOT DETECTED
Candida parapsilosis: NOT DETECTED
Candida tropicalis: NOT DETECTED
ENTEROCOCCUS SPECIES: NOT DETECTED
ESCHERICHIA COLI: NOT DETECTED
Enterobacter cloacae complex: NOT DETECTED
Enterobacteriaceae species: NOT DETECTED
Haemophilus influenzae: NOT DETECTED
Klebsiella oxytoca: NOT DETECTED
Klebsiella pneumoniae: NOT DETECTED
LISTERIA MONOCYTOGENES: NOT DETECTED
Methicillin resistance: NOT DETECTED
Neisseria meningitidis: NOT DETECTED
PSEUDOMONAS AERUGINOSA: NOT DETECTED
Proteus species: NOT DETECTED
STAPHYLOCOCCUS AUREUS BCID: NOT DETECTED
STREPTOCOCCUS AGALACTIAE: NOT DETECTED
STREPTOCOCCUS PNEUMONIAE: NOT DETECTED
STREPTOCOCCUS SPECIES: NOT DETECTED
Serratia marcescens: NOT DETECTED
Staphylococcus species: DETECTED — AB
Streptococcus pyogenes: NOT DETECTED

## 2015-12-28 MED ORDER — FUROSEMIDE 10 MG/ML IJ SOLN
40.0000 mg | Freq: Once | INTRAMUSCULAR | Status: AC
  Administered 2015-12-28: 40 mg via INTRAVENOUS
  Filled 2015-12-28: qty 4

## 2015-12-28 MED ORDER — VANCOMYCIN HCL IN DEXTROSE 750-5 MG/150ML-% IV SOLN
750.0000 mg | INTRAVENOUS | Status: DC
  Administered 2015-12-28 – 2015-12-29 (×2): 750 mg via INTRAVENOUS
  Filled 2015-12-28 (×8): qty 150

## 2015-12-28 MED ORDER — SODIUM CHLORIDE 0.9 % IV SOLN
INTRAVENOUS | Status: AC
  Administered 2015-12-28 (×2): via INTRAVENOUS

## 2015-12-28 NOTE — Consult Note (Addendum)
SURGICAL CONSULTATION NOTE (initial) - cpt: 99254  HISTORY OF PRESENT ILLNESS (HPI):  82 y.o. female presented to [facility] ED yesterday after her Right 1st toenail "fell off". Patient and her family say patient clipped her Right 1st ingrown toenail 2 - 3 weeks ago and accidentally cut her Right 1st toe. Within the next 1 - 2 days, her Right 1st toe became swollen, then red, and then over the past week became blackened and hard until the toenail "fell off" yesterday. Since antibiotics were started at time of admission yesterday, patient's family describes that the purplish discoloration of her Right 1st toe above the level of the gangrene has improved. Patient reports she can otherwise "walk a mile" without calf cramping or pain and denies toe tip pain worsened with elevation (such as recumbent position while sleeping at night). Patient also denies any fever/chills, CP, or SOB, and despite self-discontinuing Coumadin 10 years ago that was prescribed for atrial fibrillation, she denies unilateral weakness, suddenly impaired speech, or acute vision changes. Of note, patient adamantly says she is not interested in any major operation (such as arterial bypass) or major amputation under any circumstances.  Surgery is consulted by medical physician Dr. Thedore Mins in this context for evaluation and management of Right 1st toe gangrene.  PAST MEDICAL HISTORY (PMH):  Past Medical History:  Diagnosis Date  . Acid reflux   . Carotid artery occlusion    minimal  . Coronary artery disease   . Hypercholesteremia   . Hypertension   . Myocardial infarct   . PAF (paroxysmal atrial fibrillation) (HCC)   . Skin disorder   . Systolic murmur   . Thyroid disease      PAST SURGICAL HISTORY (PSH):  Past Surgical History:  Procedure Laterality Date  . ABDOMINAL HYSTERECTOMY    . CARDIAC CATHETERIZATION    . CARDIOVERSION  2012  . THYROID SURGERY       MEDICATIONS:  Prior to Admission medications   Medication Sig  Start Date End Date Taking? Authorizing Provider  amiodarone (PACERONE) 200 MG tablet Take 0.5 tablets (100 mg total) by mouth daily. 08/08/12  Yes Susa Griffins, MD  amLODipine (NORVASC) 2.5 MG tablet Take 2.5 mg by mouth daily. 08/26/14  Yes Historical Provider, MD  aspirin EC 81 MG tablet Take 1 tablet (81 mg total) by mouth daily. 03/26/13  Yes Antoine Poche, MD  atorvastatin (LIPITOR) 20 MG tablet Take 1 tablet (20 mg total) by mouth every other day. Take one tablet by mouth once daily. 03/30/14  Yes Jodelle Gross, NP  furosemide (LASIX) 20 MG tablet TAKE ONE TABLET BY MOUTH EVERY DAY AS NEEDED FOR SWELLING. 12/22/15  Yes Antoine Poche, MD  levothyroxine (SYNTHROID, LEVOTHROID) 75 MCG tablet Take 75 mcg by mouth daily.     Yes Historical Provider, MD  omeprazole (PRILOSEC) 20 MG capsule Take 40 mg by mouth daily.    Yes Historical Provider, MD  vitamin B-12 (CYANOCOBALAMIN) 1000 MCG tablet Take 1,000 mcg by mouth daily.   Yes Historical Provider, MD     ALLERGIES:  No Known Allergies   SOCIAL HISTORY:  Social History   Social History  . Marital status: Widowed    Spouse name: N/A  . Number of children: N/A  . Years of education: N/A   Occupational History  . Not on file.   Social History Main Topics  . Smoking status: Never Smoker  . Smokeless tobacco: Never Used  . Alcohol use No  . Drug  use: No  . Sexual activity: No   Other Topics Concern  . Not on file   Social History Narrative  . No narrative on file    The patient currently resides (home / rehab facility / nursing home): Home  The patient normally is (ambulatory / bedbound): Ambulatory with cane   FAMILY HISTORY:  Family History  Problem Relation Age of Onset  . Family history unknown: Yes    REVIEW OF SYSTEMS:  Constitutional: denies weight loss, fever, chills, or sweats  Eyes: denies any other vision changes, history of eye injury  ENT: denies sore throat, hearing problems  Respiratory:  denies shortness of breath, wheezing  Cardiovascular: denies chest pain, palpitations  Gastrointestinal: denies abdominal pain, N/V, or diarrhea Genitourinary: denies burning with urination or urinary frequency Musculoskeletal: denies any other joint pains or cramps  Skin: denies any other rashes or skin discolorations except as per HPI Neurological: denies any other headache, dizziness, weakness  Psychiatric: denies any other depression, anxiety   All other review of systems were negative   VITAL SIGNS:  Temp:  [97.6 F (36.4 C)-99.3 F (37.4 C)] 98.3 F (36.8 C) (12/26 0516) Pulse Rate:  [50-62] 53 (12/26 0516) Resp:  [17-23] 18 (12/26 0516) BP: (134-166)/(50-79) 147/54 (12/26 0516) SpO2:  [98 %-100 %] 98 % (12/26 0516) Weight:  [50.3 kg (111 lb)-52.2 kg (115 lb)] 50.3 kg (111 lb) (12/26 0516)     Height: 5\' 5"  (165.1 cm) Weight: 50.3 kg (111 lb) BMI (Calculated): 19.2   INTAKE/OUTPUT:  This shift: No intake/output data recorded.  Last 2 shifts: @IOLAST2SHIFTS @   PHYSICAL EXAM:  Constitutional:  -- Normal body habitus  -- Awake, alert, and oriented x3  Eyes:  -- Pupils equally round and reactive to light  -- No scleral icterus  Ear, nose, and throat:  -- No jugular venous distension  Pulmonary:  -- No crackles  -- Equal breath sounds bilaterally -- Breathing non-labored at rest Cardiovascular:  -- S1, S2 present, irregularly irregular -- No pericardial rubs Gastrointestinal:  -- Abdomen soft, nontender, nondistended, no guarding/rebound  -- No abdominal masses appreciated, pulsatile or otherwise  Musculoskeletal and Integumentary:  -- Wounds or skin discoloration: Mummified blackened NT Right 1st toe with mild erythematous and purplish discoloration with 1+ non-pitting edema proximal to the level of gangrene with no fluctuance or evidence of conversion to wet gangrene -- Extremities: B/L UE and LE FROM, hands and feet warm, B/L hands and feet warm  Neurologic:  --  Motor function: intact and symmetric -- Sensation: intact and symmetric  Pulse/Doppler Exam: (p=palpable; d=doppler signals; 0=none)     Right   Left   Rad  p   p  Fem  p   p (symmetric)  Pop  ?weakly palp  ?weakly palp (symmetric)   DP  mono   weak mono   PT  mono   p (tri)  Per  mono   tri   Labs:  CBC Latest Ref Rng & Units 12/28/2015 12/27/2015 11/24/2010  WBC 4.0 - 10.5 K/uL 7.1 10.3 4.9  Hemoglobin 12.0 - 15.0 g/dL 10.0(L) 11.5(L) 15.4(H)  Hematocrit 36.0 - 46.0 % 30.6(L) 35.2(L) 44.6  Platelets 150 - 400 K/uL 382 418(H) 266   BMP Latest Ref Rng & Units 12/28/2015 12/27/2015 11/24/2010  Glucose 65 - 99 mg/dL 98 92 91  BUN 6 - 20 mg/dL 19 16(X25(H) 20  Creatinine 0.44 - 1.00 mg/dL 0.96(E1.28(H) 4.54(U1.42(H) 9.810.99  Sodium 135 - 145 mmol/L 138 140 141  Potassium 3.5 - 5.1 mmol/L 3.9 4.5 3.7  Chloride 101 - 111 mmol/L 106 101 104  CO2 22 - 32 mmol/L 25 28 26   Calcium 8.9 - 10.3 mg/dL 8.1(L) 8.8(L) 9.9     Imaging studies:  Right foot x-ray (12/27/2015) Soft tissues of the great toe are markedly swollen. There is bony destructive change throughout the distal phalanx a comminuted fracture that is transverse in orientation through the metaphysis and extends the articular surface. Bones are osteopenic. Findings  are most consistent with osteomyelitis and pathologic fracture of the distal phalanx of the right great toe.   Assessment/Plan: (ICD-10's: 67I70.261) 82 y.o. female with dry gangrene of Right 1st toe and more proximal mild soft tissue infection associated with native arterial atherosclerotic occlusive disease, complicated by comorbidities including HTN, HLD, CAD s/p MI (managed medically), atrial fibrillation not on anticoagulation without known history of stroke, cerebrovascular disease, and GERD.   - check RLE arterial duplex  - paint Right 1st toe with betadine daily to reduce infection risk   - medical management of comorbidities, including aspirin and statin   - gently  rehydrate for AKI, antibiotics as per medical team  - will follow along with primary team  - DVT prophylaxis  All of the above findings and recommendations were discussed with the patient, her family, and her medical physician, and all of patient's and her family's questions were answered to their expressed satisfaction.  Thank you for the opportunity to participate in this patient's care.   -- Scherrie GerlachJason E. Earlene Plateravis, MD, RPVI [place]: [facility] Office: [phone]

## 2015-12-28 NOTE — Care Management Note (Signed)
Case Management Note  Patient Details  Name: [name] MRN: [id number] Date of Birth: 10/31/1920  Subjective/Objective:    Patient adm with wound/?osteomylitits of great toe. She is from home alone, ind with ADL's. Surgery following. PT recommends no follow up currently.            Action/Plan: CM will follow for needs.    Expected Discharge Date:          12/29/2015        Expected Discharge Plan:  Home w Home Health Services  In-House Referral:     Discharge planning Services  CM Consult  Post Acute Care Choice:    Choice offered to:     DME Arranged:    DME Agency:     HH Arranged:    HH Agency:     Status of Service:  In process, will continue to follow  If discussed at Long Length of Stay Meetings, dates discussed:    Additional Comments:  Tiombe Tomeo, Chrystine OilerSharley Diane, RN 12/28/2015, 12:38 PM

## 2015-12-28 NOTE — Progress Notes (Signed)
CRITICAL VALUE ALERT  Critical value received: positive blood culture  Date of notification:  12/28/2015  Time of notification: 0700  Critical value read back:yes  Nurse who received alert:  Parks RangeraWanda Amanpreet Delmont  MD notified (1st page):  Dr. Thedore MinsSingh  Time of first page:  0704  MD notified (2nd page):  Time of second page:  Responding MD:  Dr. Thedore MinsSingh  Time MD responded:  [phone]

## 2015-12-28 NOTE — Progress Notes (Signed)
PROGRESS NOTE                                                                                                                                                                                                             Patient Demographics:    [name], is a 82 y.o. female, DOB - [birth year], RUE:[id number]  Admit date - 12/27/2015   Admitting Physician Leroy Sea, MD  Outpatient Primary MD for the patient is Colette Ribas, MD  LOS - 1  Chief Complaint  Patient presents with  . Toe Injury       Brief Narrative   [name]  is a 82 y.o. female, With history of mitral valve regurgitation, CAD with obstructive disease but on medical treatment, chronic diastolic dysfunction last EF around 65%, paroxysmal atrial fibrillation not on anticoagulation due to fall risk and age, [place] vasc 2 score of at least 4, hypertension, dyslipidemia who lives at home and walks with a cane, she was admitted with right toe gangrene with osteomyelitis and right toe distal phalanx pathological fracture.     Subjective:    [name] today has, No headache, No chest pain, No abdominal pain - No Nausea, No new weakness tingling or numbness, No Cough - SOB. No foot pain.   Assessment  & Plan :      1. Right big toe gangrene with possible underlying right big toe osteomyelitis and distal phalanx pathological fracture. She is not septic or toxic at this time however looks like she is developing surrounding cellulitis, blood cultures drawn and so far one out of 2 blood cultures positive for gram-positive cocci this could be contamination, or now continue on IV vancomycin and Zosyn, check ABI, we have consulted general surgery as she most likely will require right big toe amputation and may require transmetatarsal amputation as well, continue gentle IV fluids and monitor.  Patient will be moderate to high risk candidate for adverse  cardiopulmonary outcome during the perioperative period, she still climbs one flight of stairs without any chest pain, she is fairly active, does have medically treated occlusive CAD and mitral valve regurgitation. Risks and benefits explained to the patient and son. Both accept the risks and want to proceed with surgery if needed.  2. CAD, mitral valve regurgitation, chronic diastolic dysfunction with EF 60-65%. Compensated from  a heart standpoint, no rales, she does have swelling in her right foot but I think that's due to infection, monitor on aspirin, statin for secondary prevention. As needed IV Lopressor for heart rate over 100. She uses PRN Lasix.  3. ARF. Due to #1 above. Improving with gentle hydration,monitor.  4. Dyslipidemia on statin.  5. GERD. On PPI.    Family Communication  :  Son  Code Status :  DNR  Diet : Diet NPO time specified Except for: Sips with Meds   Disposition Plan  :  TBD  Consults  :  Surgery  Procedures  :      DVT Prophylaxis  :    Heparin    Lab Results  Component Value Date   PLT 382 12/28/2015    Inpatient Medications  Scheduled Meds: . amiodarone  100 mg Oral Daily  . amLODipine  2.5 mg Oral Daily  . aspirin EC  81 mg Oral Daily  . atorvastatin  20 mg Oral QODAY  . furosemide  40 mg Intravenous Once  . heparin  5,000 Units Subcutaneous Q8H  . levothyroxine  75 mcg Oral QAC breakfast  . pantoprazole  40 mg Oral Daily  . piperacillin-tazobactam (ZOSYN)  IV  3.375 g Intravenous Q8H  . polyethylene glycol  17 g Oral BID  . vancomycin  500 mg Intravenous Q24H  . vitamin B-12  1,000 mcg Oral Daily   Continuous Infusions: . sodium chloride     PRN Meds:.albuterol, hydrALAZINE, HYDROcodone-acetaminophen, metoprolol, ondansetron **OR** ondansetron (ZOFRAN) IV  Antibiotics  :    Anti-infectives    Start     Dose/Rate Route Frequency Ordered Stop   12/28/15 1230  vancomycin (VANCOCIN) 500 mg in sodium chloride 0.9 % 100 mL IVPB       500 mg 100 mL/hr over 60 Minutes Intravenous Every 24 hours 12/27/15 1525     12/27/15 2030  piperacillin-tazobactam (ZOSYN) IVPB 3.375 g     3.375 g 12.5 mL/hr over 240 Minutes Intravenous Every 8 hours 12/27/15 1425     12/27/15 1230  piperacillin-tazobactam (ZOSYN) IVPB 3.375 g     3.375 g 100 mL/hr over 30 Minutes Intravenous  Once 12/27/15 1217 12/27/15 1311   12/27/15 1230  vancomycin (VANCOCIN) IVPB 1000 mg/200 mL premix     1,000 mg 200 mL/hr over 60 Minutes Intravenous  Once 12/27/15 1217 12/27/15 1339         Objective:   Vitals:   12/27/15 1430 12/27/15 1510 12/27/15 2158 12/28/15 0516  BP: 159/74 (!) 161/59 (!) 134/50 (!) 147/54  Pulse:  62 (!) 50 (!) 53  Resp: 17 18 18 18   Temp:  98.5 F (36.9 C) 99.3 F (37.4 C) 98.3 F (36.8 C)  TempSrc:  Oral Oral Oral  SpO2:  100% 100% 98%  Weight:    50.3 kg (111 lb)  Height:        Wt Readings from Last 3 Encounters:  12/28/15 50.3 kg (111 lb)  09/21/15 48.5 kg (107 lb)  06/24/15 48.5 kg (107 lb)     Intake/Output Summary (Last 24 hours) at 12/28/15 0804 Last data filed at 12/28/15 0300  Gross per 24 hour  Intake          1623.33 ml  Output                0 ml  Net          1623.33 ml     Physical Exam  Awake  Alert, Oriented X 3, No new F.N deficits, Normal affect [name],[name] Supple Neck,No JVD, No cervical lymphadenopathy appriciated.  Symmetrical Chest wall movement, Good air movement bilaterally, CTAB RRR,No Gallops,Rubs, +ve mitral valve systolic murmur, No Parasternal Heave +ve B.Sounds, Abd Soft, No tenderness, No organomegaly appriciated, No rebound - guarding or rigidity. No Cyanosis, Clubbing or edema, No new Rash or bruise    Rt toe/foot      Data Review:    CBC  Recent Labs Lab 12/27/15 1225 12/28/15 0603  WBC 10.3 7.1  HGB 11.5* 10.0*  HCT 35.2* 30.6*  PLT 418* 382  MCV 97.2 96.2  MCH 31.8 31.4  MCHC 32.7 32.7  RDW 12.9 12.9  LYMPHSABS 1.0  --   MONOABS 1.0  --    EOSABS 0.0  --   BASOSABS 0.0  --     Chemistries   Recent Labs Lab 12/27/15 1225 12/28/15 0603  NA 140 138  K 4.5 3.9  CL 101 106  CO2 28 25  GLUCOSE 92 98  BUN 25* 19  CREATININE 1.42* 1.28*  CALCIUM 8.8* 8.1*   ------------------------------------------------------------------------------------------------------------------ No results for input(s): CHOL, HDL, LDLCALC, TRIG, CHOLHDL, LDLDIRECT in the last 72 hours.  No results found for: HGBA1C ------------------------------------------------------------------------------------------------------------------ No results for input(s): TSH, T4TOTAL, T3FREE, THYROIDAB in the last 72 hours.  Invalid input(s): FREET3 ------------------------------------------------------------------------------------------------------------------ No results for input(s): VITAMINB12, FOLATE, FERRITIN, TIBC, IRON, RETICCTPCT in the last 72 hours.  Coagulation profile  Recent Labs Lab 12/27/15 1428  INR 1.15    No results for input(s): DDIMER in the last 72 hours.  Cardiac Enzymes No results for input(s): CKMB, TROPONINI, MYOGLOBIN in the last 168 hours.  Invalid input(s): CK ------------------------------------------------------------------------------------------------------------------    Component Value Date/Time   BNP 270.0 (H) 12/27/2015 1226    Micro Results Recent Results (from the past 240 hour(s))  Blood culture (routine x 2)     Status: None (Preliminary result)   Collection Time: 12/27/15 12:25 PM  Result Value Ref Range Status   Specimen Description RIGHT ANTECUBITAL  Final   Special Requests BOTTLES DRAWN AEROBIC AND ANAEROBIC [facility] EACH  Final   Culture PENDING  Incomplete   Report Status PENDING  Incomplete  Blood culture (routine x 2)     Status: None (Preliminary result)   Collection Time: 12/27/15 12:26 PM  Result Value Ref Range Status   Specimen Description LEFT ANTECUBITAL  Final   Special Requests BOTTLES  DRAWN AEROBIC AND ANAEROBIC [facility] EACH  Final   Culture  Setup Time   Final    GRAM POSITIVE COCCI Gram Stain Report Called to,Read Back By and Verified With: GLENN,T AT 0703 ON 12/28/15 BY HUFFINES,S .    Culture PENDING  Incomplete   Report Status PENDING  Incomplete    Radiology Reports Dg Chest Port 1 View  Result Date: 12/27/2015 CLINICAL DATA:  Cough.  Right great toe infection. EXAM: PORTABLE CHEST 1 VIEW COMPARISON:  PA and lateral chest 05/14/2009. Single-view of the chest 05/10/2009 and 04/25/2007. FINDINGS: There is cardiomegaly and vascular congestion. No consolidative process, pneumothorax or effusion. IMPRESSION: Cardiomegaly and pulmonary vascular congestion. Electronically Signed   By: Drusilla Kannerhomas  Dalessio M.D.   On: 12/27/2015 14:48   Dg Foot Complete Right  Result Date: 12/27/2015 CLINICAL DATA:  The patient tried her great toenail her of this week with onset of swelling, wound in bowel odor. The toe is discolored. Initial encounter. EXAM: RIGHT FOOT COMPLETE - 3+ VIEW COMPARISON:  None. FINDINGS: Soft tissues of  the great toe are markedly swollen. There is bony destructive change throughout the distal phalanx a comminuted fracture that is transverse in orientation through the metaphysis and extends the articular surface. Bones are osteopenic. No other acute bony abnormality is seen. IMPRESSION: Findings most consistent with osteomyelitis and pathologic fracture of the distal phalanx of the right great toe. Electronically Signed   By: Drusilla Kanner M.D.   On: 12/27/2015 13:17    Time Spent in minutes  30   Anagabriela Jokerst K M.D on 12/28/2015 at 8:04 AM  Between 7am to 7pm - Pager - [phone]  After 7pm go to [email or web address] - password [facility]  [facility] -  Office  [phone]

## 2015-12-28 NOTE — Evaluation (Signed)
Physical Therapy Evaluation Patient Details Name: [name] MRN: [id number] DOB: 03/06/1920 Today's Date: 12/28/2015   History of Present Illness   [name]  is a 82 y.o. female, With history of mitral valve regurgitation, CAD with obstructive disease but on medical treatment, chronic diastolic dysfunction last EF around 65%, paroxysmal atrial fibrillation not on anticoagulation due to fall risk and age, [place] 2 score of at least 4, hypertension, dyslipidemia who lives at home and walks with a cane comes to the ER after she neck her right big toe one week ago while cutting her nails. Since then the toe has gradually gotten black in color and today her nail fell off, came to the ER where her right big toe obviously looks gangrenous, x-ray revealed underlying osteomyelitis and distal phalanx fracture. Patient herself was nontoxic, not septic, presently had no pain, no fevers, no leukocytosis I was called to admit the patient for right big toe gangrene. She is relatively symptom-free and in good spirits  Clinical Impression  Pt is to see surgeon today.  At this point pt is functioning at prior level of care and does not need any skilled PT.  If pt has surgery intervention pt will need skilled therapy at this point.     Follow Up Recommendations No PT follow up    Equipment Recommendations   None   Recommendations for Other Services   NONe    Precautions / Restrictions Precautions Precautions: None Restrictions Weight Bearing Restrictions: No      Mobility  Bed Mobility Overal bed mobility: Independent                Transfers Overall transfer level: Independent                  Ambulation/Gait Ambulation/Gait assistance: Independent Ambulation Distance (Feet): 150 Feet Assistive device: Straight cane Gait Pattern/deviations: WFL(Within Functional Limits)   Gait velocity interpretation: at or above normal speed for age/gender    Stairs            Wheelchair Mobility    Modified Rankin (Stroke Patients Only)             Pertinent Vitals/Pain Pain Assessment: No/denies pain    Home Living Family/patient expects to be discharged to:: Private residence Living Arrangements: Alone Available Help at Discharge: Family Type of Home: House Home Access: Stairs to enter   Secretary/administratorntrance Stairs-Number of Steps: 2 Home Layout: One level Home Equipment: Cane - single point      Prior Function Level of Independence: Independent               Hand Dominance   Dominant Hand: Right    Extremity/Trunk Assessment        Lower Extremity Assessment Lower Extremity Assessment: Overall WFL for tasks assessed       Communication   Communication: No difficulties  Cognition Arousal/Alertness: Awake/alert Behavior During Therapy: WFL for tasks assessed/performed Overall Cognitive Status: Within Functional Limits for tasks assessed                      General Comments          Assessment/Plan    PT Assessment Patent does not need any further PT services  PT Problem List            PT Treatment Interventions      PT Goals (Current goals can be found in the Care Plan section)  Acute Rehab PT Goals  PT Goal Formulation: With patient Potential to Achieve Goals: Good    Frequency             End of Session Equipment Utilized During Treatment: Gait belt Activity Tolerance: Patient tolerated treatment well Patient left: in chair;with call bell/phone within reach Nurse Communication: Mobility status         Time: [id number] PT Time Calculation (min) (ACUTE ONLY): 23 min   Charges:   PT Evaluation $PT Eval Low Complexity: 1 Procedure     PT G CodesVirgina Organ:      Citlaly Camplin, PT CLT [phone] 12/28/2015, 8:27 AM

## 2015-12-28 NOTE — Progress Notes (Signed)
OT Cancellation Note  Patient Details Name: [name] MRN: [id number] DOB: 02/17/1920   Cancelled Treatment:     Reason evaluation not completed: Chart reviewed, spoke with PT, pt screened for OT needs. Pt is currently functioning at baseline with ADL completion and functional mobility tasks. Pt has family who live nearby and check on pt daily. No further acute OT needs at this time.   Ezra SitesLeslie Brandan Glauber, OTR/L  [phone] 12/28/2015, 8:57 AM

## 2015-12-29 NOTE — Progress Notes (Addendum)
PROGRESS NOTE                                                                                                                                                                                                             Patient Demographics:    [name], is a 82 y.o. female, DOB - 10/23/1920, [id number]  Admit date - 12/27/2015   Admitting Physician Leroy SeaPrashant K Singh, MD  Outpatient Primary MD for the patient is Colette RibasGOLDING, JOHN CABOT, MD  LOS - 2  Chief Complaint  Patient presents with  . Toe Injury       Brief Narrative   [name]  is a 82 y.o. female, With history of mitral valve regurgitation, CAD with obstructive disease but on medical treatment, chronic diastolic dysfunction last EF around 65%, paroxysmal atrial fibrillation not on anticoagulation due to fall risk and age, [place] 2 score of at least 4, hypertension, dyslipidemia who lives at home and walks with a cane, she was admitted with right toe gangrene with osteomyelitis and right toe distal phalanx pathological fracture.     Subjective:    Tamitha Mardella Mayo today has, No headache, No chest pain, No abdominal pain - No Nausea, No new weakness tingling or numbness, No Cough - SOB. No foot pain.   Assessment  & Plan :      1. Right big toe gangrene with possible underlying right big toe osteomyelitis and distal phalanx pathological fracture. She is not septic or toxic at this time however looks like she is developing surrounding cellulitis, blood cultures drawn and so far one out of 2 blood cultures positive for gram-positive cocci this could be contamination, or now continue on IV vancomycin and Zosyn, check ABI, we have consulted general surgery. They plan for angiogram in am to further narrow treatment options.  2. CAD, mitral valve regurgitation, chronic diastolic dysfunction with EF 60-65%. Compensated from a heart standpoint, no rales, she does  have swelling in her right foot but I think that's due to infection, monitor on aspirin, statin for secondary prevention. As needed IV Lopressor for heart rate over 100.   3. ARF. Due to #1 above. Improving with gentle hydration,monitor.  4. Dyslipidemia on statin.  5. GERD. On PPI.    Family Communication  :  Son at bedside updated on plan of care and  questions answered  Code Status :  DNR  Diet : Diet Heart Room service appropriate? Yes; Fluid consistency: Thin   Disposition Plan  :  TBD  Consults  :  Surgery  Procedures  :      DVT Prophylaxis  :    Heparin    Lab Results  Component Value Date   PLT 382 12/28/2015    Inpatient Medications  Scheduled Meds: . amiodarone  100 mg Oral Daily  . amLODipine  2.5 mg Oral Daily  . aspirin EC  81 mg Oral Daily  . atorvastatin  20 mg Oral QODAY  . heparin  5,000 Units Subcutaneous Q8H  . levothyroxine  75 mcg Oral QAC breakfast  . pantoprazole  40 mg Oral Daily  . piperacillin-tazobactam (ZOSYN)  IV  3.375 g Intravenous Q8H  . polyethylene glycol  17 g Oral BID  . vancomycin  750 mg Intravenous Q24H  . vitamin B-12  1,000 mcg Oral Daily   Continuous Infusions:  PRN Meds:.albuterol, hydrALAZINE, HYDROcodone-acetaminophen, metoprolol, ondansetron **OR** ondansetron (ZOFRAN) IV  Antibiotics  :    Anti-infectives    Start     Dose/Rate Route Frequency Ordered Stop   12/28/15 1230  vancomycin (VANCOCIN) 500 mg in sodium chloride 0.9 % 100 mL IVPB  Status:  Discontinued     500 mg 100 mL/hr over 60 Minutes Intravenous Every 24 hours 12/27/15 1525 12/28/15 0952   12/28/15 1230  vancomycin (VANCOCIN) IVPB 750 mg/150 ml premix     750 mg 150 mL/hr over 60 Minutes Intravenous Every 24 hours 12/28/15 0952     12/27/15 2030  piperacillin-tazobactam (ZOSYN) IVPB 3.375 g     3.375 g 12.5 mL/hr over 240 Minutes Intravenous Every 8 hours 12/27/15 1425     12/27/15 1230  piperacillin-tazobactam (ZOSYN) IVPB 3.375 g     3.375  g 100 mL/hr over 30 Minutes Intravenous  Once 12/27/15 1217 12/27/15 1311   12/27/15 1230  vancomycin (VANCOCIN) IVPB 1000 mg/200 mL premix     1,000 mg 200 mL/hr over 60 Minutes Intravenous  Once 12/27/15 1217 12/27/15 1339         Objective:   Vitals:   12/28/15 1451 12/28/15 2153 12/29/15 0500 12/29/15 0558  BP: (!) 129/41 130/62  (!) 165/57  Pulse: (!) 51 62  (!) 51  Resp: 20 20  20   Temp: 98.1 F (36.7 C) 98.4 F (36.9 C)  98.7 F (37.1 C)  TempSrc: Oral Oral  Oral  SpO2: 100% 100%  100%  Weight:   50.8 kg (112 lb 1 oz)   Height:        Wt Readings from Last 3 Encounters:  12/29/15 50.8 kg (112 lb 1 oz)  09/21/15 48.5 kg (107 lb)  06/24/15 48.5 kg (107 lb)     Intake/Output Summary (Last 24 hours) at 12/29/15 1154 Last data filed at 12/28/15 1700  Gross per 24 hour  Intake          1190.83 ml  Output                0 ml  Net          1190.83 ml     Physical Exam  Awake Alert, Oriented X 3, No new F.N deficits, Normal affect  [place].AT,PERRAL Supple Neck,No JVD, No cervical lymphadenopathy appriciated.  Symmetrical Chest wall movement, Good air movement bilaterally, CTAB RRR,No Gallops,Rubs, +ve mitral valve systolic murmur, No Parasternal Heave +ve B.Sounds, Abd Soft, No tenderness, No  organomegaly appriciated, No rebound - guarding or rigidity. No Cyanosis, Clubbing or edema, No new Rash or bruise    Rt toe/foot        Data Review:    CBC  Recent Labs Lab 12/27/15 1225 12/28/15 0603  WBC 10.3 7.1  HGB 11.5* 10.0*  HCT 35.2* 30.6*  PLT 418* 382  MCV 97.2 96.2  MCH 31.8 31.4  MCHC 32.7 32.7  RDW 12.9 12.9  LYMPHSABS 1.0  --   MONOABS 1.0  --   EOSABS 0.0  --   BASOSABS 0.0  --     Chemistries   Recent Labs Lab 12/27/15 1225 12/28/15 0603  NA 140 138  K 4.5 3.9  CL 101 106  CO2 28 25  GLUCOSE 92 98  BUN 25* 19  CREATININE 1.42* 1.28*  CALCIUM 8.8* 8.1*    ------------------------------------------------------------------------------------------------------------------ No results for input(s): CHOL, HDL, LDLCALC, TRIG, CHOLHDL, LDLDIRECT in the last 72 hours.  No results found for: HGBA1C ------------------------------------------------------------------------------------------------------------------ No results for input(s): TSH, T4TOTAL, T3FREE, THYROIDAB in the last 72 hours.  Invalid input(s): FREET3 ------------------------------------------------------------------------------------------------------------------ No results for input(s): VITAMINB12, FOLATE, FERRITIN, TIBC, IRON, RETICCTPCT in the last 72 hours.  Coagulation profile  Recent Labs Lab 12/27/15 1428  INR 1.15    No results for input(s): DDIMER in the last 72 hours.  Cardiac Enzymes No results for input(s): CKMB, TROPONINI, MYOGLOBIN in the last 168 hours.  Invalid input(s): CK ------------------------------------------------------------------------------------------------------------------    Component Value Date/Time   BNP 270.0 (H) 12/27/2015 1226    Micro Results Recent Results (from the past 240 hour(s))  Blood culture (routine x 2)     Status: Abnormal (Preliminary result)   Collection Time: 12/27/15 12:25 PM  Result Value Ref Range Status   Specimen Description RIGHT ANTECUBITAL  Final   Special Requests BOTTLES DRAWN AEROBIC AND ANAEROBIC [facility] EACH  Final   Culture  Setup Time   Final    GRAM POSITIVE COCCI RECOVERED FROM ANAEROBIC BOTTLE Gram Stain Report Called to,Read Back By and Verified With: STONE,B AT 1020 ON 12/28/15 BY BAYSE,L    Culture STAPHYLOCOCCUS LUGDUNENSIS (A)  Final   Report Status PENDING  Incomplete  Blood culture (routine x 2)     Status: Abnormal (Preliminary result)   Collection Time: 12/27/15 12:26 PM  Result Value Ref Range Status   Specimen Description LEFT ANTECUBITAL  Final   Special Requests BOTTLES DRAWN AEROBIC  AND ANAEROBIC [facility] EACH  Final   Culture  Setup Time   Final    GRAM POSITIVE COCCI Gram Stain Report Called to,Read Back By and Verified With: GLENN,T AT 0703 ON 12/28/15 BY HUFFINES,S . CRITICAL RESULT CALLED TO, READ BACK BY AND VERIFIED WITH: K PITTMAN,PHARMD AT 1438 12/28/15 BY L BENFIELD    Culture (A)  Final    STAPHYLOCOCCUS LUGDUNENSIS SUSCEPTIBILITIES TO FOLLOW Performed at [facility]    Report Status PENDING  Incomplete  Blood Culture ID Panel (Reflexed)     Status: Abnormal   Collection Time: 12/27/15 12:26 PM  Result Value Ref Range Status   Enterococcus species NOT DETECTED NOT DETECTED Final   Listeria monocytogenes NOT DETECTED NOT DETECTED Final   Staphylococcus species DETECTED (A) NOT DETECTED Final    Comment: CRITICAL RESULT CALLED TO, READ BACK BY AND VERIFIED WITH: K PITTMAN,PHARMD AT 1438 12/28/15 BY L BENFIELD    Staphylococcus aureus NOT DETECTED NOT DETECTED Final   Methicillin resistance NOT DETECTED NOT DETECTED Final   Streptococcus species NOT  DETECTED NOT DETECTED Final   Streptococcus agalactiae NOT DETECTED NOT DETECTED Final   Streptococcus pneumoniae NOT DETECTED NOT DETECTED Final   Streptococcus pyogenes NOT DETECTED NOT DETECTED Final   Acinetobacter baumannii NOT DETECTED NOT DETECTED Final   Enterobacteriaceae species NOT DETECTED NOT DETECTED Final   Enterobacter cloacae complex NOT DETECTED NOT DETECTED Final   Escherichia coli NOT DETECTED NOT DETECTED Final   Klebsiella oxytoca NOT DETECTED NOT DETECTED Final   Klebsiella pneumoniae NOT DETECTED NOT DETECTED Final   Proteus species NOT DETECTED NOT DETECTED Final   Serratia marcescens NOT DETECTED NOT DETECTED Final   Haemophilus influenzae NOT DETECTED NOT DETECTED Final   Neisseria meningitidis NOT DETECTED NOT DETECTED Final   Pseudomonas aeruginosa NOT DETECTED NOT DETECTED Final   Candida albicans NOT DETECTED NOT DETECTED Final   Candida glabrata NOT DETECTED NOT  DETECTED Final   Candida krusei NOT DETECTED NOT DETECTED Final   Candida parapsilosis NOT DETECTED NOT DETECTED Final   Candida tropicalis NOT DETECTED NOT DETECTED Final    Comment: Performed at [facility]    Radiology Reports US Arterial Seg Single  Result Date: 12/28/2015 CLINICAL DATA:  Nonhealing right great toe wound, gangrene, osteomyelitis EXAM: NONINVASIVE PHYSIOLOGIC VASCULAR STUDY OF BILATERAL LOWER EXTREMITIES TECHNIQUE: Evaluation of both lower extremities were performed at rest, including calculation of ankle-brachial indices with single level Doppler, pressure and pulse volume recording. COMPARISON:  None available FINDINGS: Right ABI:  0.58 Left ABI:  0.86 Right Lower Extremity: Irregular monophasic right posterior tibial waveform. Biphasic right dorsalis pedis waveform. Markedly decreased right ABI measuring 0.58 indicative of significant or moderate range right lower extremity peripheral vascular disease. Left Lower Extremity: Biphasic left posterior tibial and dorsalis pedis waveforms. Mildly decreased ABI measuring 0.86. Suspect nonocclusive left lower extremity vascular disease. IMPRESSION: Right ABI 0.58 indicative of moderate range right lower extremity peripheral vascular disease. Minor decrease in the left ABI as above. Electronically Signed   By: Judie Petit.  Shick M.D.   On: 12/28/2015 12:21   [place] Lower Ext Art Right  Result Date: 12/28/2015 CLINICAL DATA:  Right great toe gangrene and osteomyelitis. EXAM: UNILATERAL RIGHT LOWER EXTREMITY ARTERIAL DUPLEX SCAN TECHNIQUE: Gray-scale sonography as well as color Doppler and duplex ultrasound was performed to evaluate the arteries of the lower extremity. COMPARISON:  ABI examination 12/28/2015 FINDINGS: Atherosclerotic calcifications in the right common femoral artery with a monophasic waveform. Deep femoral artery is patent with monophasic waveform. Calcifications and plaque in the right SFA. Monophasic waveforms throughout the  right SFA. Peak systolic velocity in the proximal right SFA is 133 cm/sec and the peak systolic velocity in the mid SFA is 221 cm/sec. Atherosclerotic calcifications in the right popliteal artery with monophasic waveform. Monophasic waveform in the right peroneal artery. Monophasic waveform in the right posterior tibial artery. The distal right posterior tibial artery may be occluded. Monophasic waveform in the anterior tibial artery. Markedly elevated peak systolic velocity in the proximal anterior tibial artery measuring up to 496 cm/sec, this is compatible with stenosis. IMPRESSION: Diffuse atherosclerotic disease throughout the right lower extremity arteries. Right runoff disease demonstrated by stenosis in the right anterior tibial artery and occlusion or minimal flow in the distal posterior tibial artery. At least mild stenosis in the mid right SFA. Monophasic waveforms throughout the right lower extremity may signify underlying inflow disease. Electronically Signed   By: Richarda Overlie M.D.   On: 12/28/2015 14:41   Dg Chest Port 1 View  Result Date: 12/27/2015 CLINICAL  DATA:  Cough.  Right great toe infection. EXAM: PORTABLE CHEST 1 VIEW COMPARISON:  PA and lateral chest 05/14/2009. Single-view of the chest 05/10/2009 and 04/25/2007. FINDINGS: There is cardiomegaly and vascular congestion. No consolidative process, pneumothorax or effusion. IMPRESSION: Cardiomegaly and pulmonary vascular congestion. Electronically Signed   By: Drusilla Kannerhomas  Dalessio M.D.   On: 12/27/2015 14:48   Dg Foot Complete Right  Result Date: 12/27/2015 CLINICAL DATA:  The patient tried her great toenail her of this week with onset of swelling, wound in bowel odor. The toe is discolored. Initial encounter. EXAM: RIGHT FOOT COMPLETE - 3+ VIEW COMPARISON:  None. FINDINGS: Soft tissues of the great toe are markedly swollen. There is bony destructive change throughout the distal phalanx a comminuted fracture that is transverse in  orientation through the metaphysis and extends the articular surface. Bones are osteopenic. No other acute bony abnormality is seen. IMPRESSION: Findings most consistent with osteomyelitis and pathologic fracture of the distal phalanx of the right great toe. Electronically Signed   By: Drusilla Kannerhomas  Dalessio M.D.   On: 12/27/2015 13:17    Time Spent in minutes  25 minutes   Chaya JanHERNANDEZ ACOSTA,ESTELA M.D on 12/29/2015 at 11:54 AM  Between 7am to 7pm - Pager - [phone]  After 7pm go to [email or web address] - password [email or web address]  [facility] -  Office  [phone]

## 2015-12-29 NOTE — Progress Notes (Signed)
PHARMACY - PHYSICIAN COMMUNICATION CRITICAL VALUE ALERT - BLOOD CULTURE IDENTIFICATION (BCID)  Results for orders placed or performed during the hospital encounter of 12/27/15  Blood Culture ID Panel (Reflexed) (Collected: 12/27/2015 12:26 PM)  Result Value Ref Range   Enterococcus species NOT DETECTED NOT DETECTED   Listeria monocytogenes NOT DETECTED NOT DETECTED   Staphylococcus species DETECTED (A) NOT DETECTED   Staphylococcus aureus NOT DETECTED NOT DETECTED   Methicillin resistance NOT DETECTED NOT DETECTED   Streptococcus species NOT DETECTED NOT DETECTED   Streptococcus agalactiae NOT DETECTED NOT DETECTED   Streptococcus pneumoniae NOT DETECTED NOT DETECTED   Streptococcus pyogenes NOT DETECTED NOT DETECTED   Acinetobacter baumannii NOT DETECTED NOT DETECTED   Enterobacteriaceae species NOT DETECTED NOT DETECTED   Enterobacter cloacae complex NOT DETECTED NOT DETECTED   Escherichia coli NOT DETECTED NOT DETECTED   Klebsiella oxytoca NOT DETECTED NOT DETECTED   Klebsiella pneumoniae NOT DETECTED NOT DETECTED   Proteus species NOT DETECTED NOT DETECTED   Serratia marcescens NOT DETECTED NOT DETECTED   Haemophilus influenzae NOT DETECTED NOT DETECTED   Neisseria meningitidis NOT DETECTED NOT DETECTED   Pseudomonas aeruginosa NOT DETECTED NOT DETECTED   Candida albicans NOT DETECTED NOT DETECTED   Candida glabrata NOT DETECTED NOT DETECTED   Candida krusei NOT DETECTED NOT DETECTED   Candida parapsilosis NOT DETECTED NOT DETECTED   Candida tropicalis NOT DETECTED NOT DETECTED    Name of physician (or Provider): Ardyth HarpsHernandez  Assessment:  Currently patient on broad spectrum antibiotics with Vancomycin and Zosyn Plan: Continue vancomycin and fu with cultures ID and sensitivities.  Elder CyphersLorie Yassine Brunsman, BS Pharm D, [place] Clinical Pharmacist Pager [phone] 12/29/2015  11:51 AM

## 2015-12-29 NOTE — Progress Notes (Signed)
SURGICAL PROGRESS NOTE (cpt [phone])  Hospital Day(s): 2.   Post op day(s):  [name].   Interval History: Patient seen and examined, no acute events or new complaints overnight. Patient denies any worsening of Right 1st toe pain or discoloration, fever/chills, CP, or SOB.  Review of Systems:  Constitutional: denies fever, chills  HEENT: denies cough or congestion  Respiratory: denies any shortness of breath  Cardiovascular: denies chest pain or palpitations  Gastrointestinal: denies abdominal pain, N/V, or diarrhea Genitourinary: denies burning with urination or urinary frequency Musculoskeletal: denies pain, decreased motor or sensation Integumentary: denies any other rashes or skin discolorations except as per interval history Neurological: denies HA or vision/hearing changes   Vital signs in last 24 hours: [min-max] current  Temp:  [98.1 F (36.7 C)-98.7 F (37.1 C)] 98.7 F (37.1 C) (12/27 0558) Pulse Rate:  [51-62] 51 (12/27 0558) Resp:  [20] 20 (12/27 0558) BP: (129-165)/(41-62) 165/57 (12/27 0558) SpO2:  [100 %] 100 % (12/27 0558) Weight:  [50.8 kg (112 lb 1 oz)] 50.8 kg (112 lb 1 oz) (12/27 0500)     Height: 5\' 5"  (165.1 cm) Weight: 50.8 kg (112 lb 1 oz) BMI (Calculated): 19.2   Intake/Output this shift:  No intake/output data recorded.   Intake/Output last 2 shifts:  @IOLAST2SHIFTS @   Physical Exam:  Constitutional: alert, cooperative and no distress  HENT: normocephalic without obvious abnormality  Eyes: PERRL, EOM's grossly intact and symmetric  Neuro: CN II - XII grossly intact and symmetric without deficit  Respiratory: breathing non-labored at rest  Cardiovascular: regular rate and sinus rhythm  Gastrointestinal: soft, non-tender, and non-distended  Musculoskeletal: UE and LE FROM, B/L motor and sensation grossly intact, mummified blackened NT Right 1st toe with mild erythematous and purplish discoloration with slightly decreased 1+ non-pitting edema proximal to  the level of gangrene with no fluctuance or evidence of conversion to wet gangrene  Pulse/Doppler Exam: (p=palpable; d=doppler signals; 0=none)      Right       Left   Rad      p         p   Fem  proximally palpable, distally monophasic     p   Pop   mono           DP   mono            weak mono   PT   mono       p (tri)   Per   mono         tri   Labs:  CBC Latest Ref Rng & Units 12/28/2015 12/27/2015 11/24/2010  WBC 4.0 - 10.5 K/uL 7.1 10.3 4.9  Hemoglobin 12.0 - 15.0 g/dL 10.0(L) 11.5(L) 15.4(H)  Hematocrit 36.0 - 46.0 % 30.6(L) 35.2(L) 44.6  Platelets 150 - 400 K/uL 382 418(H) 266   BMP Latest Ref Rng & Units 12/28/2015 12/27/2015 11/24/2010  Glucose 65 - 99 mg/dL 98 92 91  BUN 6 - 20 mg/dL 19 65(H25(H) 20  Creatinine 0.44 - 1.00 mg/dL 8.46(N1.28(H) 6.29(B1.42(H) 2.840.99  Sodium 135 - 145 mmol/L 138 140 141  Potassium 3.5 - 5.1 mmol/L 3.9 4.5 3.7  Chloride 101 - 111 mmol/L 106 101 104  CO2 22 - 32 mmol/L 25 28 26   Calcium 8.9 - 10.3 mg/dL 8.1(L) 8.8(L) 9.9    Imaging studies:  Right lower extremity arterial duplex (12/28/2015) Monophasic waveforms throughout the right lower extremity may signify underlying inflow disease.  Diffuse atherosclerotic disease throughout the  right lower extremity arteries.  At least mild stenosis in the mid right SFA.  Right runoff disease demonstrated by stenosis in the right anterior tibial artery and occlusion or minimal flow in the distal posterior tibial artery.   Assessment/Plan: (ICD-10's: 16I70.261) 82 y.o. female with dry gangrene of Right 1st toe and more proximal mild soft tissue infection associated with native arterial atherosclerotic occlusive disease, complicated by comorbidities including HTN, HLD, CAD s/p MI (managed medically), atrial fibrillation not on anticoagulation without known history of stroke, cerebrovascular disease, and GERD.              - RLE arterial duplex results reviewed with patient             - gently rehydrate for  AKI, antibiotics as per medical team  - NPO after MN for Right lower extremity angiogram tomorrow             - paint affected (blackened) portion of Right 1st toe with betadine daily to reduce infection risk   - if hemodynamically significant Right common femoral arterial stenosis (or surgically correctable arterial stenosis otherwise), will recommend VVS evaluation             - medical management of comorbidities, including aspirin and statin             - will continue to follow along with primary team             - DVT prophylaxis  All of the above findings and recommendations were discussed with the patient, her family, and her medical physician, and all of patient's and her family's questions were answered to their expressed satisfaction.  Thank you for the opportunity to participate in this patient's care.   -- Scherrie GerlachJason E. Earlene Plateravis, MD, RPVI [place]: [facility] Office: [phone]

## 2015-12-30 ENCOUNTER — Encounter (HOSPITAL_COMMUNITY): Payer: Self-pay | Admitting: Anesthesiology

## 2015-12-30 ENCOUNTER — Inpatient Hospital Stay (HOSPITAL_COMMUNITY): Payer: Medicare Other

## 2015-12-30 ENCOUNTER — Encounter (HOSPITAL_COMMUNITY)
Admission: EM | Disposition: A | Discharge status: Home Health | Payer: Self-pay | Source: Home / Self Care | Attending: Internal Medicine

## 2015-12-30 DIAGNOSIS — M86071 Acute hematogenous osteomyelitis, right ankle and foot: Secondary | ICD-10-CM

## 2015-12-30 HISTORY — PX: LOWER EXTREMITY ANGIOGRAM: SHX5508

## 2015-12-30 HISTORY — PX: IR GENERIC HISTORICAL: IMG1180011

## 2015-12-30 LAB — CULTURE, BLOOD (ROUTINE X 2)

## 2015-12-30 LAB — SURGICAL PCR SCREEN
MRSA, PCR: NEGATIVE
Staphylococcus aureus: NEGATIVE

## 2015-12-30 SURGERY — ANGIOGRAM, LOWER EXTREMITY
Anesthesia: LOCAL | Site: Groin | Laterality: Right

## 2015-12-30 MED ORDER — CHLORHEXIDINE GLUCONATE CLOTH 2 % EX PADS
6.0000 | MEDICATED_PAD | Freq: Once | CUTANEOUS | Status: AC
  Administered 2015-12-30: 6 via TOPICAL

## 2015-12-30 MED ORDER — SODIUM CHLORIDE 0.9 % IV SOLN
INTRAVENOUS | Status: DC
  Administered 2015-12-31 – 2016-01-05 (×5): via INTRAVENOUS

## 2015-12-30 MED ORDER — LIDOCAINE HCL (PF) 1 % IJ SOLN
INTRAMUSCULAR | Status: AC
  Filled 2015-12-30: qty 30

## 2015-12-30 MED ORDER — BUPIVACAINE HCL 0.5 % IJ SOLN
INTRAMUSCULAR | Status: DC | PRN
  Administered 2015-12-30: 4 mL

## 2015-12-30 MED ORDER — SODIUM CHLORIDE 0.9 % IV SOLN
INTRAVENOUS | Status: DC | PRN
  Administered 2015-12-30: 10 mL

## 2015-12-30 MED ORDER — HEPARIN SODIUM (PORCINE) 5000 UNIT/ML IJ SOLN
Freq: Once | INTRAMUSCULAR | Status: AC
Start: 2015-12-30 — End: 2016-01-01
  Filled 2015-12-30: qty 1.2

## 2015-12-30 MED ORDER — SODIUM CHLORIDE 0.9 % IV SOLN
INTRAVENOUS | Status: DC | PRN
  Administered 2015-12-30: 500 mL via INTRAMUSCULAR

## 2015-12-30 MED ORDER — IOPAMIDOL (ISOVUE-300) INJECTION 61%
INTRAVENOUS | Status: DC | PRN
  Administered 2015-12-30: 24.5 mL

## 2015-12-30 MED ORDER — HEPARIN SODIUM (PORCINE) 1000 UNIT/ML IJ SOLN
INTRAMUSCULAR | Status: AC
  Filled 2015-12-30: qty 5

## 2015-12-30 MED ORDER — IOPAMIDOL (ISOVUE-300) INJECTION 61%
INTRAVENOUS | Status: AC
  Filled 2015-12-30: qty 50

## 2015-12-30 MED ORDER — BUPIVACAINE HCL (PF) 0.5 % IJ SOLN
INTRAMUSCULAR | Status: AC
  Filled 2015-12-30: qty 30

## 2015-12-30 SURGICAL SUPPLY — 45 items
BAG DECANTER FOR FLEXI CONT (MISCELLANEOUS) ×4 IMPLANT
BALLN MUSTANG 5.0X40 135 (BALLOONS)
BALLOON MUSTANG 5.0X40 135 (BALLOONS) IMPLANT
BNDG COHESIVE 4X5 TAN STRL (GAUZE/BANDAGES/DRESSINGS) ×2 IMPLANT
CATH ANGIO OM CVD 65X4FR (CATHETERS) ×1 IMPLANT
CATH OMNI FLUSH (CATHETERS) ×1
CHLORAPREP W/TINT 26ML (MISCELLANEOUS) ×4 IMPLANT
COVER PROBE FLX POLY STRL (MISCELLANEOUS) ×2 IMPLANT
DECANTER SPIKE VIAL GLASS SM (MISCELLANEOUS) ×6 IMPLANT
DEVICE TORQUE (MISCELLANEOUS) ×2 IMPLANT
DRAPE C-ARMOR (DRAPES) ×2 IMPLANT
DRAPE FEMORAL (DRAPES) ×2 IMPLANT
DRAPE PROXIMA HALF (DRAPES) ×2 IMPLANT
DRSG TEGADERM 4X4.75 (GAUZE/BANDAGES/DRESSINGS) ×4 IMPLANT
ELECT REM PT RETURN 9FT ADLT (ELECTROSURGICAL) ×2
ELECTRODE REM PT RTRN 9FT ADLT (ELECTROSURGICAL) ×1 IMPLANT
GAUZE SPONGE 4X4 16PLY XRAY LF (GAUZE/BANDAGES/DRESSINGS) ×2 IMPLANT
GLIDECATH 4FR ANG (CATHETERS) ×2 IMPLANT
GLOVE BIOGEL PI IND STRL 7.0 (GLOVE) ×1 IMPLANT
GLOVE BIOGEL PI IND STRL 7.5 (GLOVE) ×1 IMPLANT
GLOVE BIOGEL PI INDICATOR 7.0 (GLOVE) ×1
GLOVE BIOGEL PI INDICATOR 7.5 (GLOVE) ×1
GLOVE ECLIPSE 7.0 STRL STRAW (GLOVE) ×2 IMPLANT
GOWN STRL REUS W/ TWL LRG LVL3 (GOWN DISPOSABLE) ×2 IMPLANT
GOWN STRL REUS W/TWL LRG LVL3 (GOWN DISPOSABLE) ×2
GUIDEWIRE ANGLED .035 180CM (WIRE) ×2 IMPLANT
GUIDEWIRE BENTSON TYPE (WIRE) IMPLANT
KIT BLADEGUARD II DBL (SET/KITS/TRAYS/PACK) ×2 IMPLANT
KIT ROOM TURNOVER APOR (KITS) ×2 IMPLANT
MICROPUNCTURE 4FR NT-U-SST (SHEATH) ×2
NEEDLE HYPO 25X1 1.5 SAFETY (NEEDLE) ×2 IMPLANT
PACK BASIC III (CUSTOM PROCEDURE TRAY) ×1
PACK SRG BSC III STRL LF ECLPS (CUSTOM PROCEDURE TRAY) ×1 IMPLANT
PAD ARMBOARD 7.5X6 YLW CONV (MISCELLANEOUS) ×2 IMPLANT
SET BASIN LINEN APH (SET/KITS/TRAYS/PACK) ×2 IMPLANT
SET MICROPUNCTURE 4FR NT-U-SST (SHEATH) ×1 IMPLANT
SHEATH PINNACLE 4FR (VASCULAR PRODUCTS) ×2 IMPLANT
SHEATH PINNACLE 6FR (SHEATH) IMPLANT
SHEATH PINNACLE ST 6F 45CM (SHEATH) IMPLANT
SPONGE GAUZE 2X2 8PLY STRL LF (GAUZE/BANDAGES/DRESSINGS) ×4 IMPLANT
STOCKINETTE IMPERVIOUS LG (DRAPES) ×2 IMPLANT
STOPCOCK 4 WAY LG BORE MALE ST (IV SETS) ×2 IMPLANT
SYR 20CC LL (SYRINGE) ×2 IMPLANT
SYR CONTROL 10ML LL (SYRINGE) ×2 IMPLANT
SYRINGE 10CC LL (SYRINGE) ×4 IMPLANT

## 2015-12-30 NOTE — Progress Notes (Signed)
PROGRESS NOTE                                                                                                                                                                                                             Patient Demographics:    [name], is a 82 y.o. female, DOB - 06/27/1920, ZOX:[id number]  Admit date - 12/27/2015   Admitting Physician Leroy SeaPrashant K Singh, MD  Outpatient Primary MD for the patient is Colette RibasGOLDING, JOHN CABOT, MD  LOS - 3  Chief Complaint  Patient presents with  . Toe Injury       Brief Narrative   Maria Maria LaymanLindsey  is a 82 y.o. female, With history of mitral valve regurgitation, CAD with obstructive disease but on medical treatment, chronic diastolic dysfunction last EF around 65%, paroxysmal atrial fibrillation not on anticoagulation due to fall risk and age, [place] 2 score of at least 4, hypertension, dyslipidemia who lives at home and walks with a cane, she was admitted with right toe gangrene with osteomyelitis and right toe distal phalanx pathological fracture.     Subjective:    Maria Maria LaymanLindsey today has, No headache, No chest pain, No abdominal pain - No Nausea, No new weakness tingling or numbness, No Cough - SOB. No foot pain.   Assessment  & Plan :      1. Right big toe gangrene with possible underlying right big toe osteomyelitis and distal phalanx pathological fracture. She is not septic or toxic at this time however looks like she is developing surrounding cellulitis, blood cultures drawn and so far one out of 2 blood cultures positive for gram-positive cocci this could be contamination, or now continue on IV vancomycin and Zosyn, check ABI, we have consulted general surgery. They plan for angiogram today to further narrow treatment options.  2. CAD, mitral valve regurgitation, chronic diastolic dysfunction with EF 60-65%. Compensated from a heart standpoint, no rales, she does  have swelling in her right foot but I think that's due to infection, monitor on aspirin, statin for secondary prevention. As needed IV Lopressor for heart rate over 100.   3. ARF. Due to #1 above. Improving with gentle hydration,monitor.  4. Dyslipidemia on statin.  5. GERD. On PPI.    Family Communication  :  Son at bedside updated on plan of care and questions  answered  Code Status :  DNR  Diet : Diet Heart Room service appropriate? Yes; Fluid consistency: Thin   Disposition Plan  :  TBD  Consults  :  Surgery  Procedures  :      DVT Prophylaxis  :    Heparin    Lab Results  Component Value Date   PLT 382 12/28/2015    Inpatient Medications  Scheduled Meds: . amiodarone  100 mg Oral Daily  . amLODipine  2.5 mg Oral Daily  . aspirin EC  81 mg Oral Daily  . atorvastatin  20 mg Oral QODAY  . heparin 6000 unit irrigation   Irrigation Once  . heparin  5,000 Units Subcutaneous Q8H  . levothyroxine  75 mcg Oral QAC breakfast  . pantoprazole  40 mg Oral Daily  . piperacillin-tazobactam (ZOSYN)  IV  3.375 g Intravenous Q8H  . polyethylene glycol  17 g Oral BID  . vancomycin  750 mg Intravenous Q24H  . vitamin B-12  1,000 mcg Oral Daily   Continuous Infusions: . sodium chloride 100 mL/hr at 12/30/15 1215   PRN Meds:.albuterol, hydrALAZINE, HYDROcodone-acetaminophen, metoprolol, ondansetron **OR** ondansetron (ZOFRAN) IV  Antibiotics  :    Anti-infectives    Start     Dose/Rate Route Frequency Ordered Stop   12/28/15 1230  vancomycin (VANCOCIN) 500 mg in sodium chloride 0.9 % 100 mL IVPB  Status:  Discontinued     500 mg 100 mL/hr over 60 Minutes Intravenous Every 24 hours 12/27/15 1525 12/28/15 0952   12/28/15 1230  vancomycin (VANCOCIN) IVPB 750 mg/150 ml premix     750 mg 150 mL/hr over 60 Minutes Intravenous Every 24 hours 12/28/15 0952     12/27/15 2030  piperacillin-tazobactam (ZOSYN) IVPB 3.375 g     3.375 g 12.5 mL/hr over 240 Minutes Intravenous Every  8 hours 12/27/15 1425     12/27/15 1230  piperacillin-tazobactam (ZOSYN) IVPB 3.375 g     3.375 g 100 mL/hr over 30 Minutes Intravenous  Once 12/27/15 1217 12/27/15 1311   12/27/15 1230  vancomycin (VANCOCIN) IVPB 1000 mg/200 mL premix     1,000 mg 200 mL/hr over 60 Minutes Intravenous  Once 12/27/15 1217 12/27/15 1339         Objective:   Vitals:   12/30/15 1302 12/30/15 1330 12/30/15 1358 12/30/15 1422  BP: (!) 151/54 (!) 164/52 (!) 161/53 (!) 159/45  Pulse: (!) 58 (!) 57 (!) 55 (!) 52  Resp: (!) 22 (!) 22 (!) 22 20  Temp: 98.2 F (36.8 C)  98.1 F (36.7 C) 98 F (36.7 C)  TempSrc: Oral  Oral Oral  SpO2: 96% 99% 96% 98%  Weight:      Height:        Wt Readings from Last 3 Encounters:  12/30/15 50.8 kg (112 lb)  09/21/15 48.5 kg (107 lb)  06/24/15 48.5 kg (107 lb)     Intake/Output Summary (Last 24 hours) at 12/30/15 1454 Last data filed at 12/29/15 1800  Gross per 24 hour  Intake              240 ml  Output                0 ml  Net              240 ml     Physical Exam  Awake Alert, Oriented X 3, No new F.N deficits, Normal affect [place].AT,PERRAL Supple Neck,No JVD, No cervical lymphadenopathy appriciated.  Symmetrical Chest wall movement, Good air movement bilaterally, CTAB RRR,No Gallops,Rubs, +ve mitral valve systolic murmur, No Parasternal Heave +ve B.Sounds, Abd Soft, No tenderness, No organomegaly appriciated, No rebound - guarding or rigidity. No Cyanosis, Clubbing or edema, No new Rash or bruise    Rt toe/foot        Data Review:    CBC  Recent Labs Lab 12/27/15 1225 12/28/15 0603  WBC 10.3 7.1  HGB 11.5* 10.0*  HCT 35.2* 30.6*  PLT 418* 382  MCV 97.2 96.2  MCH 31.8 31.4  MCHC 32.7 32.7  RDW 12.9 12.9  LYMPHSABS 1.0  --   MONOABS 1.0  --   EOSABS 0.0  --   BASOSABS 0.0  --     Chemistries   Recent Labs Lab 12/27/15 1225 12/28/15 0603  NA 140 138  K 4.5 3.9  CL 101 106  CO2 28 25  GLUCOSE 92 98  BUN 25* 19    CREATININE 1.42* 1.28*  CALCIUM 8.8* 8.1*   ------------------------------------------------------------------------------------------------------------------ No results for input(s): CHOL, HDL, LDLCALC, TRIG, CHOLHDL, LDLDIRECT in the last 72 hours.  No results found for: HGBA1C ------------------------------------------------------------------------------------------------------------------ No results for input(s): TSH, T4TOTAL, T3FREE, THYROIDAB in the last 72 hours.  Invalid input(s): FREET3 ------------------------------------------------------------------------------------------------------------------ No results for input(s): VITAMINB12, FOLATE, FERRITIN, TIBC, IRON, RETICCTPCT in the last 72 hours.  Coagulation profile  Recent Labs Lab 12/27/15 1428  INR 1.15    No results for input(s): DDIMER in the last 72 hours.  Cardiac Enzymes No results for input(s): CKMB, TROPONINI, MYOGLOBIN in the last 168 hours.  Invalid input(s): CK ------------------------------------------------------------------------------------------------------------------    Component Value Date/Time   BNP 270.0 (H) 12/27/2015 1226    Micro Results Recent Results (from the past 240 hour(s))  Blood culture (routine x 2)     Status: Abnormal   Collection Time: 12/27/15 12:25 PM  Result Value Ref Range Status   Specimen Description RIGHT ANTECUBITAL  Final   Special Requests BOTTLES DRAWN AEROBIC AND ANAEROBIC [facility] EACH  Final   Culture  Setup Time   Final    GRAM POSITIVE COCCI RECOVERED FROM ANAEROBIC BOTTLE Gram Stain Report Called to,Read Back By and Verified With: STONE,B AT 1020 ON 12/28/15 BY BAYSE,L    Culture (A)  Final    STAPHYLOCOCCUS LUGDUNENSIS SUSCEPTIBILITIES PERFORMED ON PREVIOUS CULTURE WITHIN THE LAST 5 DAYS. Performed at [facility]    Report Status 12/30/2015 FINAL  Final  Blood culture (routine x 2)     Status: Abnormal   Collection Time: 12/27/15 12:26 PM   Result Value Ref Range Status   Specimen Description LEFT ANTECUBITAL  Final   Special Requests BOTTLES DRAWN AEROBIC AND ANAEROBIC [facility] EACH  Final   Culture  Setup Time   Final    GRAM POSITIVE COCCI Gram Stain Report Called to,Read Back By and Verified With: GLENN,T AT 0703 ON 12/28/15 BY HUFFINES,S . CRITICAL RESULT CALLED TO, READ BACK BY AND VERIFIED WITH: K PITTMAN,PHARMD AT 1438 12/28/15 BY L BENFIELD Performed at [facility]    Culture STAPHYLOCOCCUS LUGDUNENSIS (A)  Final   Report Status 12/30/2015 FINAL  Final   Organism ID, Bacteria STAPHYLOCOCCUS LUGDUNENSIS  Final      Susceptibility   Staphylococcus lugdunensis - MIC*    CIPROFLOXACIN <=0.5 SENSITIVE Sensitive     ERYTHROMYCIN <=0.25 SENSITIVE Sensitive     GENTAMICIN <=0.5 SENSITIVE Sensitive     OXACILLIN 2 SENSITIVE Sensitive     TETRACYCLINE <=1 SENSITIVE Sensitive  VANCOMYCIN <=0.5 SENSITIVE Sensitive     TRIMETH/SULFA <=10 SENSITIVE Sensitive     CLINDAMYCIN <=0.25 SENSITIVE Sensitive     RIFAMPIN <=0.5 SENSITIVE Sensitive     Inducible Clindamycin NEGATIVE Sensitive     * STAPHYLOCOCCUS LUGDUNENSIS  Blood Culture ID Panel (Reflexed)     Status: Abnormal   Collection Time: 12/27/15 12:26 PM  Result Value Ref Range Status   Enterococcus species NOT DETECTED NOT DETECTED Final   Listeria monocytogenes NOT DETECTED NOT DETECTED Final   Staphylococcus species DETECTED (A) NOT DETECTED Final    Comment: CRITICAL RESULT CALLED TO, READ BACK BY AND VERIFIED WITH: K PITTMAN,PHARMD AT 1438 12/28/15 BY L BENFIELD    Staphylococcus aureus NOT DETECTED NOT DETECTED Final   Methicillin resistance NOT DETECTED NOT DETECTED Final   Streptococcus species NOT DETECTED NOT DETECTED Final   Streptococcus agalactiae NOT DETECTED NOT DETECTED Final   Streptococcus pneumoniae NOT DETECTED NOT DETECTED Final   Streptococcus pyogenes NOT DETECTED NOT DETECTED Final   Acinetobacter baumannii NOT DETECTED NOT DETECTED  Final   Enterobacteriaceae species NOT DETECTED NOT DETECTED Final   Enterobacter cloacae complex NOT DETECTED NOT DETECTED Final   Escherichia coli NOT DETECTED NOT DETECTED Final   Klebsiella oxytoca NOT DETECTED NOT DETECTED Final   Klebsiella pneumoniae NOT DETECTED NOT DETECTED Final   Proteus species NOT DETECTED NOT DETECTED Final   Serratia marcescens NOT DETECTED NOT DETECTED Final   Haemophilus influenzae NOT DETECTED NOT DETECTED Final   Neisseria meningitidis NOT DETECTED NOT DETECTED Final   Pseudomonas aeruginosa NOT DETECTED NOT DETECTED Final   Candida albicans NOT DETECTED NOT DETECTED Final   Candida glabrata NOT DETECTED NOT DETECTED Final   Candida krusei NOT DETECTED NOT DETECTED Final   Candida parapsilosis NOT DETECTED NOT DETECTED Final   Candida tropicalis NOT DETECTED NOT DETECTED Final    Comment: [facility]  Surgical pcr screen     Status: None   Collection Time: 12/30/15  2:20 AM  Result Value Ref Range Status   MRSA, PCR NEGATIVE NEGATIVE Final   Staphylococcus aureus NEGATIVE NEGATIVE Final    Comment:        The Xpert SA Assay (FDA approved for NASAL specimens in patients over 80 years of age), is one component of a comprehensive surveillance program.  Test performance has been validated by [facility] for patients greater than or equal to 80 year old. It is not intended to diagnose infection nor to guide or monitor treatment.     Radiology Reports [place] Arterial Seg Single  Result Date: 12/28/2015 CLINICAL DATA:  Nonhealing right great toe wound, gangrene, osteomyelitis EXAM: NONINVASIVE PHYSIOLOGIC VASCULAR STUDY OF BILATERAL LOWER EXTREMITIES TECHNIQUE: Evaluation of both lower extremities were performed at rest, including calculation of ankle-brachial indices with single level Doppler, pressure and pulse volume recording. COMPARISON:  None available FINDINGS: Right ABI:  0.58 Left ABI:  0.86 Right Lower Extremity:  Irregular monophasic right posterior tibial waveform. Biphasic right dorsalis pedis waveform. Markedly decreased right ABI measuring 0.58 indicative of significant or moderate range right lower extremity peripheral vascular disease. Left Lower Extremity: Biphasic left posterior tibial and dorsalis pedis waveforms. Mildly decreased ABI measuring 0.86. Suspect nonocclusive left lower extremity vascular disease. IMPRESSION: Right ABI 0.58 indicative of moderate range right lower extremity peripheral vascular disease. Minor decrease in the left ABI as above. Electronically Signed   By: Judie PetitM.  Shick M.D.   On: 12/28/2015 12:21   [place] Lower Ext Art  Right  Result Date: 12/28/2015 CLINICAL DATA:  Right great toe gangrene and osteomyelitis. EXAM: UNILATERAL RIGHT LOWER EXTREMITY ARTERIAL DUPLEX SCAN TECHNIQUE: Gray-scale sonography as well as color Doppler and duplex ultrasound was performed to evaluate the arteries of the lower extremity. COMPARISON:  ABI examination 12/28/2015 FINDINGS: Atherosclerotic calcifications in the right common femoral artery with a monophasic waveform. Deep femoral artery is patent with monophasic waveform. Calcifications and plaque in the right SFA. Monophasic waveforms throughout the right SFA. Peak systolic velocity in the proximal right SFA is 133 cm/sec and the peak systolic velocity in the mid SFA is 221 cm/sec. Atherosclerotic calcifications in the right popliteal artery with monophasic waveform. Monophasic waveform in the right peroneal artery. Monophasic waveform in the right posterior tibial artery. The distal right posterior tibial artery may be occluded. Monophasic waveform in the anterior tibial artery. Markedly elevated peak systolic velocity in the proximal anterior tibial artery measuring up to 496 cm/sec, this is compatible with stenosis. IMPRESSION: Diffuse atherosclerotic disease throughout the right lower extremity arteries. Right runoff disease demonstrated by stenosis in the  right anterior tibial artery and occlusion or minimal flow in the distal posterior tibial artery. At least mild stenosis in the mid right SFA. Monophasic waveforms throughout the right lower extremity may signify underlying inflow disease. Electronically Signed   By: Richarda Overlie M.D.   On: 12/28/2015 14:41   Dg Chest Port 1 View  Result Date: 12/27/2015 CLINICAL DATA:  Cough.  Right great toe infection. EXAM: PORTABLE CHEST 1 VIEW COMPARISON:  PA and lateral chest 05/14/2009. Single-view of the chest 05/10/2009 and 04/25/2007. FINDINGS: There is cardiomegaly and vascular congestion. No consolidative process, pneumothorax or effusion. IMPRESSION: Cardiomegaly and pulmonary vascular congestion. Electronically Signed   By: Drusilla Kanner M.D.   On: 12/27/2015 14:48   Dg Foot Complete Right  Result Date: 12/27/2015 CLINICAL DATA:  The patient tried her great toenail her of this week with onset of swelling, wound in bowel odor. The toe is discolored. Initial encounter. EXAM: RIGHT FOOT COMPLETE - 3+ VIEW COMPARISON:  None. FINDINGS: Soft tissues of the great toe are markedly swollen. There is bony destructive change throughout the distal phalanx a comminuted fracture that is transverse in orientation through the metaphysis and extends the articular surface. Bones are osteopenic. No other acute bony abnormality is seen. IMPRESSION: Findings most consistent with osteomyelitis and pathologic fracture of the distal phalanx of the right great toe. Electronically Signed   By: Drusilla Kanner M.D.   On: 12/27/2015 13:17    Time Spent in minutes  25 minutes   Chaya Jan M.D on 12/30/2015 at 2:54 PM  Between 7am to 7pm - Pager - [phone]  After 7pm go to [email or web address] - password [facility]  [facility] -  Office  [phone]

## 2015-12-30 NOTE — Care Management Note (Signed)
Case Management Note  Patient Details  Name: [name] MRN: [id number] Date of Birth: 05/18/1920  Subjective/Objective:                  Patient to have angiogram today.  Action/Plan:  Patient would like HH RN at discharge. Offered choice of HH agencies. Concha [facility]ain [facility] notified and will obtain order from chart. Patient and family aware that [facility] hours to initiate services.   Expected Discharge Date:       12/31/2015           Expected Discharge Plan:  Home w Home Health Services  In-House Referral:  NA  Discharge planning Services  CM Consult  Post Acute Care Choice:  Home Health Choice offered to:  Patient, Adult Children  DME Arranged:    DME Agency:     HH Arranged:  RN HH Agency:  [facility]  Status of Service:  In process, will continue to follow  If discussed at Long Length of Stay Meetings, dates discussed:    Additional Comments:  Kaisley Stiverson, Chrystine OilerSharley Diane, RN 12/30/2015, 8:03 AM

## 2015-12-30 NOTE — Op Note (Signed)
SURGICAL ANGIOGRAPHY/OPERATIVE REPORT   DATE OF PROCEDURE: 12/30/2015   ATTENDING SURGEON: Barbara CowerJason E. Earlene Plateravis, MD  ANESTHESIA: Local with light IV sedation   PRE-OPERATIVE DIAGNOSIS: Right lower extremity peripheral arterial occlusive disease with first toe dry gangrene  POST-OPERATIVE DIAGNOSIS: Right lower extremity peripheral arterial occlusive disease with first toe dry gangrene  PROCEDURE(S):  1.) Percutaneous access of Left common femoral artery 2.) Limited LLE arteriogram 3.) Introduction of catheter into aorta 4.) Aortogram with B/L pelvic arteriogram 5.) Selective catheterization of 2nd order abdominal/pelvic/lower extremity artery 6.) RLE arteriogram  INTRAOPERATIVE FINDINGS:  Aorta: patent small-caliber infrarenal abdominal aorta without evidence of stenosis or aneurysm (only luminal assessment)  Right: patent Right CIA, EIA, IIA, CFA with mid-CFA calcifications without evidence of hemodynamically significant stenosis, patent SFA with minimal non-hemodynamically significant stenosis at origin of SFA, diffusely calcified SFA with diffuse mild luminal irregularity and possible mild non-hemodynamically significant stenosis at distal SFA/adductor canal, patent popliteal artery, moderate to severe short-segment stenoses at origins of AT and tibio-peroneal trunk with subsequent occlusion of proximal AT, occlusion of proximal-/mid- PT, and severe short-segment stenosis of proximal peroneal artery with single-vessel peroneal artery runoff providing reconstitution of what appears to be DP and PT at the level of distal ankle/proximal foot  Left: patent Left CIA, proximal EIA, proximal IIA, distal EIA, CFA with mid-CFA access site, proximal SFA, and proximal DFA without evidence of hemodynamically significant stenosis in arteries imaged  INTRAOPERATIVE FLUIDS: 24 mL contrast used   FLUOROSCOPY: 7.4 minutes and 77.23 mGy  ESTIMATED BLOOD LOSS: Minimal (<20 mL)   SPECIMENS: None    IMPLANTS: None  DRAINS: None   COMPLICATIONS: None apparent   CONDITION AT COMPLETION: Hemodynamically stable and awake   PULSE/DOPPLER EXAM AT END OF PROCEDURE:   (p=palpable; d=doppler signals; 0=none)     Right   Left   Rad  p   p   Fem  p   p   DP  mono        weak mono   PT  mono   p (tri)   Per  mono   tri   DISPOSITION: Recovery   INDICATION(S) FOR PROCEDURE:  Patient is a 82 y.o. female who presented to [facility] ED 12/25 after her Right 1st toenail "fell off". Patient and her family said patient clipped her Right 1st ingrown toenail 2 - 3 weeks earlier and accidentally cut her Right 1st toe. Within the next 1 - 2 days, her Right 1st toe became swollen, then red, and then over the week prior to presentation became blackened and hard until the toenail "fell off" 12/24. Since antibiotics were started at time of admission, patient's family describes that the purplish discoloration of her Right 1st toe above the level of the mummified gangrene has improved. Patient reports she can otherwise "walk a mile" without calf cramping or pain and denies toe tip pain worsened with elevation (such as recumbent position while sleeping at night). Patient also denies any fever/chills, CP, or SOB, and despite self-discontinuing Coumadin 10 years ago that was prescribed for atrial fibrillation, she denied unilateral weakness, suddenly impaired speech, or acute vision changes. Of note, patient adamantly said she is not interested in any major operation (such as arterial bypass) or major amputation under any circumstances. All risks, benefits, and alternatives to above elective procedures were discussed with the patient, who elected to proceed, and informed consent was accordingly obtained at that time.  DETAILS OF PROCEDURE:  Patient was brought to the interventional suite and  appropriately identified. In supine position, access sites were prepped and draped in the usual sterile fashion, and following a  brief timeout, percutaneous Left common femoral arterial access was obtained using ultrasound guidance and Seldinger technique, by which local anesthetic was injected over the femoral pulse and access needle was inserted into the common femoral artery, through which soft guidewire was advanced, over which access needle was withdrawn and 57F x 11 cm sheath was advanced and flushed with heparinized saline. Limited LLE arteriogram was performed with visualization of distal EIA, CFA with mid-CFA access site, proximal SFA, and proximal DFA without evidence of hemodynamically significant stenosis in the arteries imaged. Guidewire was then advanced into the distal aorta, over which a 57F Omni-Flush catheter was advanced into patient's infrarenal abdominal aorta and used to perform a limited aortogram (for primarily purpose of guidance due to patient's chronic kidney disease with AKI at time of current admission) with findings of patent small-caliber infrarenal abdominal aorta without evidence of stenosis or aneurysm (only luminal assessment) and patent B/L CIA, proximal EIA, and proximal IIA after removal of the soft guidewire. Soft, angled glidewire was then re-introduced, advanced through the catheter, and used to select the contralateral common iliac artery. Guidewire was advanced to the femoral head, and the diagnostic catheter was exchanged for an angled glide catheter, through which soft angled glidewire was removed and RLE arteriography was performed with visualization of patent EIA, patent CFA with mid-CFA calcifications without evidence of hemodynamically significant stenosis, patent SFA with minimal non-hemodynamically significant stenosis at origin of SFA, diffusely calcified SFA with diffuse mild luminal irregularity and possible mild non-hemodynamically significant stenosis at distal SFA/adductor canal, patent popliteal artery, moderate to severe short-segment stenoses at origins of AT and tibio-peroneal trunk  with subsequent occlusion of proximal AT, occlusion of proximal-/mid- PT, and severe short-segment stenosis of proximal peroneal artery with single-vessel peroneal artery runoff providing reconstitution of what appears to be DP and PT at the level of distal ankle/proximal foot.   No further studies or interventions appeared indicated at this time, and Left common femoral arterial 57F sheath was removed, and direct pressure was applied for hemostasis. Patient was then transferred to recovery for post-procedural monitoring and care. I was present for all aspects of the procedures, and no intraprocedural complications were apparent.

## 2015-12-30 NOTE — Progress Notes (Signed)
Called report to Darvin Neighbourshristina Knight, floor nurse. Patient ready for transfer.

## 2015-12-30 NOTE — OR Nursing (Signed)
Procedure complete at 1151; patient is to remain flat until 4:00 pm per Dr. Earlene Plateravis

## 2015-12-31 ENCOUNTER — Inpatient Hospital Stay (HOSPITAL_COMMUNITY): Payer: Medicare Other

## 2015-12-31 ENCOUNTER — Encounter (HOSPITAL_COMMUNITY): Payer: Self-pay | Admitting: Surgery

## 2015-12-31 DIAGNOSIS — I2510 Atherosclerotic heart disease of native coronary artery without angina pectoris: Secondary | ICD-10-CM

## 2015-12-31 DIAGNOSIS — I96 Gangrene, not elsewhere classified: Secondary | ICD-10-CM

## 2015-12-31 LAB — BASIC METABOLIC PANEL
ANION GAP: 6 (ref 5–15)
BUN: 13 mg/dL (ref 6–20)
CALCIUM: 8.2 mg/dL — AB (ref 8.9–10.3)
CO2: 23 mmol/L (ref 22–32)
CREATININE: 1.22 mg/dL — AB (ref 0.44–1.00)
Chloride: 109 mmol/L (ref 101–111)
GFR calc non Af Amer: 37 mL/min — ABNORMAL LOW (ref >60)
GFR, EST AFRICAN AMERICAN: 43 mL/min — AB (ref >60)
GLUCOSE: 105 mg/dL — AB (ref 65–99)
POTASSIUM: 3.4 mmol/L — AB (ref 3.5–5.1)
Sodium: 138 mmol/L (ref 135–145)

## 2015-12-31 MED ORDER — CEFAZOLIN IN D5W 1 GM/50ML IV SOLN
1.0000 g | Freq: Three times a day (TID) | INTRAVENOUS | Status: DC
  Administered 2015-12-31 – 2016-01-02 (×6): 1 g via INTRAVENOUS
  Filled 2015-12-31 (×15): qty 50

## 2015-12-31 NOTE — Progress Notes (Signed)
Pharmacy Antibiotic Note  [name] is a 82 y.o. female admitted on 12/27/2015 with cellulitis, gangrene of toe, wound infection, (+) BCx.  Pharmacy has been consulted for Ancef dosing.  Elderly female with small body habitus and renal impairment.   Plan: Ancef 1gm IV q8h F/u renal function, cultures and clinical course  Height: 5\' 5"  (165.1 cm) Weight: 112 lb (50.8 kg) IBW/kg (Calculated) : 57  Temp (24hrs), Avg:98.2 F (36.8 C), Min:98 F (36.7 C), Max:98.3 F (36.8 C)   Recent Labs Lab 12/27/15 1225 12/27/15 1428 12/28/15 0603 12/31/15 1058  WBC 10.3  --  7.1  --   CREATININE 1.42*  --  1.28* 1.22*  LATICACIDVEN 1.3 1.3  --   --     Estimated Creatinine Clearance: 23.1 mL/min (by C-G formula based on SCr of 1.22 mg/dL (H)).    No Known Allergies  Thank you for allowing pharmacy to be a part of this patient's care.  Valrie HartHall, Mallika Sanmiguel A 12/31/2015 12:50 PM

## 2015-12-31 NOTE — Consult Note (Signed)
Primary Physician: Primary Cardiologist:  Branch  Asked to see re preop risk stratification    HPI:  Pt is a 82 yo with known CAD  Cath in 2008 with 80% ostial LCx lesion  Plan for medical therapy.  Also episode of afib in past  Cardioverted.  None documented since  Pt has done OK  No CP  She sweeps some   Walks with cane.  Vacuums a little  Breathing is OK  No SOB    Pt presented to [facility] with blackened R gr toe  Patient denies pain  Angiogram done yesterday  Occlusion of prox AT , prox/mid PT on R.  Consideration for revascularization.     Past Medical History:  Diagnosis Date  . Acid reflux   . Carotid artery occlusion    minimal  . Coronary artery disease   . Hypercholesteremia   . Hypertension   . Myocardial infarct   . PAF (paroxysmal atrial fibrillation) (HCC)   . Skin disorder   . Systolic murmur   . Thyroid disease     Medications Prior to Admission  Medication Sig Dispense Refill  . amiodarone (PACERONE) 200 MG tablet Take 0.5 tablets (100 mg total) by mouth daily. 15 tablet 8  . amLODipine (NORVASC) 2.5 MG tablet Take 2.5 mg by mouth daily.  5  . aspirin EC 81 MG tablet Take 1 tablet (81 mg total) by mouth daily. 90 tablet 3  . atorvastatin (LIPITOR) 20 MG tablet Take 1 tablet (20 mg total) by mouth every other day. Take one tablet by mouth once daily. 30 tablet 7  . furosemide (LASIX) 20 MG tablet TAKE ONE TABLET BY MOUTH EVERY DAY AS NEEDED FOR SWELLING. 30 tablet 3  . levothyroxine (SYNTHROID, LEVOTHROID) 75 MCG tablet Take 75 mcg by mouth daily.      Marland Kitchen. omeprazole (PRILOSEC) 20 MG capsule Take 40 mg by mouth daily.     . vitamin B-12 (CYANOCOBALAMIN) 1000 MCG tablet Take 1,000 mcg by mouth daily.       Marland Kitchen. amiodarone  100 mg Oral Daily  . amLODipine  2.5 mg Oral Daily  . aspirin EC  81 mg Oral Daily  . atorvastatin  20 mg Oral QODAY  .  ceFAZolin (ANCEF) IV  1 g Intravenous Q8H  . heparin 6000 unit irrigation   Irrigation Once  . heparin  5,000 Units  Subcutaneous Q8H  . levothyroxine  75 mcg Oral QAC breakfast  . pantoprazole  40 mg Oral Daily  . polyethylene glycol  17 g Oral BID  . vitamin B-12  1,000 mcg Oral Daily    Infusions: . sodium chloride 100 mL/hr at 12/31/15 0747    No Known Allergies  Social History   Social History  . Marital status: Widowed    Spouse name: N/A  . Number of children: N/A  . Years of education: N/A   Occupational History  . Not on file.   Social History Main Topics  . Smoking status: Never Smoker  . Smokeless tobacco: Never Used  . Alcohol use No  . Drug use: No  . Sexual activity: No   Other Topics Concern  . Not on file   Social History Narrative  . No narrative on file    Family History  Problem Relation Age of Onset  . Family history unknown: Yes    REVIEW OF SYSTEMS:  All systems reviewed  Negative to the above problem except as noted above.  PHYSICAL EXAM: Vitals:   12/31/15 0600 12/31/15 1309  BP: (!) 149/60 (!) 146/51  Pulse: 74 (!) 54  Resp: 16 18  Temp: 98.3 F (36.8 C) (!) 96.1 F (35.6 C)     Intake/Output Summary (Last 24 hours) at 12/31/15 1637 Last data filed at 12/31/15 1600  Gross per 24 hour  Intake             2930 ml  Output              250 ml  Net             2680 ml    General:  Thin 82 yo in NAD  No respiratory difficulty HEENT: normal Neck: supple. no JVD. Carotids 2+ bilat; no bruits. No lymphadenopathy or thryomegaly appreciated. Cor: PMI nondisplaced. Regular rate & rhythm. No rubs, gallops or murmurs. Lungs: clear Abdomen: soft, nontender, nondistended. No hepatosplenomegaly. No bruits or masses. Good bowel sounds. Extremities:R gr toe gangrenous  1+ LPT   Neuro: alert & oriented x 3, cranial nerves grossly intact. moves all 4 extremities w/o difficulty. Affect pleasant.  ECG: 12/25:  SB 58 bpm  Anteroseptal MI    Results for orders placed or performed during the hospital encounter of 12/27/15 (from the past 24 hour(s))    Basic metabolic panel     Status: Abnormal   Collection Time: 12/31/15 10:58 AM  Result Value Ref Range   Sodium 138 135 - 145 mmol/L   Potassium 3.4 (L) 3.5 - 5.1 mmol/L   Chloride 109 101 - 111 mmol/L   CO2 23 22 - 32 mmol/L   Glucose, Bld 105 (H) 65 - 99 mg/dL   BUN 13 6 - 20 mg/dL   Creatinine, Ser 2.951.22 (H) 0.44 - 1.00 mg/dL   Calcium 8.2 (L) 8.9 - 10.3 mg/dL   GFR calc non Af Amer 37 (L) >60 mL/min   GFR calc Af Amer 43 (L) >60 mL/min   Anion gap 6 5 - 15   No results found.   ASSESSMENT: Pt is a 82 yo with history of signif CAD of LCX in 2008 though with medical management has been symptom free.  Activty level prior approx 3-4 METS She Presents with gangrene of R gr toe.    On exam:  No evid for CHF   Currently in SR  No afib while in hospital  (concerning for PAF).   Overall, patient is at moderate increased risk for major cardiac event with vascular surgery  I do not think further cardiac testing would help further.   Would continue telemetry throughout  Close control of BP/HR throughout   ConsecoPaula Jalayia Bagheri

## 2015-12-31 NOTE — Progress Notes (Addendum)
SURGICAL PROGRESS NOTE  Hospital Day(s): 4.   Post op day(s): 1 Day Post-Op.   Interval History: Patient seen and examined, no acute events or new complaints overnight. Patient denies Left groin or Right 1st toe pain, fever/chills, CP, or SOB and has been ambulating since completing 4 hours of bedrest laying flat yesterday.  Review of Systems:  Constitutional: denies fever, chills  HEENT: denies cough or congestion  Respiratory: denies any shortness of breath  Cardiovascular: denies chest pain or palpitations  Gastrointestinal: denies abdominal pain, N/V, or diarrhea Genitourinary: denies burning with urination or urinary frequency Musculoskeletal: denies pain, decreased motor or sensation Integumentary: denies any other rashes or skin discolorations Neurological: denies HA or vision/hearing changes   Vital signs in last 24 hours: [min-max] current  Temp:  [98 F (36.7 C)-98.6 F (37 C)] 98.3 F (36.8 C) (12/29 0600) Pulse Rate:  [52-74] 74 (12/29 0600) Resp:  [16-23] 16 (12/29 0600) BP: (146-166)/(45-61) 149/60 (12/29 0600) SpO2:  [96 %-100 %] 100 % (12/29 0600)     Height: 5\' 5"  (165.1 cm) Weight: 50.8 kg (112 lb) BMI (Calculated): 18.7   Intake/Output this shift:  No intake/output data recorded.   Intake/Output last 2 shifts:  @IOLAST2SHIFTS @   Physical Exam:  Constitutional: alert, cooperative and no distress  HENT: normocephalic without obvious abnormality  Eyes: PERRL, EOM's grossly intact and symmetric  Neuro: CN II - XII grossly intact and symmetric without deficit  Respiratory: breathing non-labored at rest  Cardiovascular: regular rate and sinus rhythm  Gastrointestinal: soft, non-tender, and non-distended  Musculoskeletal: UE and LE FROM, B/L motor and sensation grossly intact, mummified blackened NT Right 1st toe with mild erythematous and purplish discoloration with slightly decreased 1+ non-pitting edema proximal to the level of gangrene with no fluctuance  or evidence of conversion to wet gangrene, Left groin dressing c/d/i and NT  Pulse/Doppler Exam: (p=palpable; d=doppler signals; 0=none)                                       Right                                                               Left              Rad                    p                                                                     p              Fem     proximally palpable, distally monophasic                     p              Pop  mono                                                                              DP                   mono                                                         weak mono              PT                    mono                                                                tri              Per                  mono                                                                 tri   Labs:  BMP Latest Ref Rng & Units 12/31/2015 12/28/2015 12/27/2015  Glucose 65 - 99 mg/dL 161(W105(H) 98 92  BUN 6 - 20 mg/dL 13 19 96(E25(H)  Creatinine 0.44 - 1.00 mg/dL 4.54(U1.22(H) 9.81(X1.28(H) 9.14(N1.42(H)  Sodium 135 - 145 mmol/L 138 138 140  Potassium 3.5 - 5.1 mmol/L 3.4(L) 3.9 4.5  Chloride 101 - 111 mmol/L 109 106 101  CO2 22 - 32 mmol/L 23 25 28   Calcium 8.9 - 10.3 mg/dL 8.2(L) 8.1(L) 8.8(L)    Imaging studies: See angiography report, no other new pertinent imaging   Assessment/Plan: (ICD-10's: I70.261) 82 y.o.femaledoing well overall from post-angiography perspective Post-Op Day 1 s/p RLE diagnostic angiography for dry gangrene of Right 1st toe and more proximal mild soft tissue infection associated with native arterialatherosclerotic occlusive disease, complicated by comorbidities including HTN, HLD, CAD s/p MI (managed medically), atrial fibrillation not on anticoagulation without known history of stroke, cerebrovascular disease, and GERD.  - angiography results reviewed with patient - gently rehydrate for AKI s/p IV  contrast, antibiotics as per medical team             - patient and her son now express interest in workup for possible arterial bypass  - if patient is amenable to peripheral arterial bypass, [facility]  - patient and family express understanding that operative decisions to be made between patient and operating surgeon - paint affected (blackened)  portion of Right 1st toe with betadine daily to reduce infection risk              - risk stratification and optimization including cardiology evaluation appreciated - medical management of comorbidities, including aspirin and statin - will obtain lower extremity vein mapping - DVT prophylaxis  All of the above findings and recommendations were discussed with the patient,her family, and her medical physician,and all of patient's and her family's questions were answered to their expressed satisfaction.  Thank you for the opportunity to participate in this patient's care.  -- Scherrie GerlachJason E. Earlene Plateravis, MD, RPVI [place]: [facility] Office: [phone]

## 2015-12-31 NOTE — Progress Notes (Signed)
PROGRESS NOTE                                                                                                                                                                                                             Patient Demographics:    [name], is a 82 y.o. female, DOB - Jul 23, 1920, ZOX:[id number]  Admit date - 12/27/2015   Admitting Physician Leroy Sea, MD  Outpatient Primary MD for the patient is Colette Ribas, MD  LOS - 4  Chief Complaint  Patient presents with  . Toe Injury       Brief Narrative   [name]  is a 81 y.o. female, With history of mitral valve regurgitation, CAD with obstructive disease but on medical treatment, chronic diastolic dysfunction last EF around 65%, paroxysmal atrial fibrillation not on anticoagulation due to fall risk and age, [place] vasc 2 score of at least 4, hypertension, dyslipidemia who lives at home and walks with a cane, she was admitted with right toe gangrene with osteomyelitis and right toe distal phalanx pathological fracture.     Subjective:    Tanayia Rhue today has, No headache, No chest pain, No abdominal pain - No Nausea, No new weakness tingling or numbness, No Cough - SOB. No foot pain.   Assessment  & Plan :      1. Right big toe gangrene with possible underlying right big toe osteomyelitis and distal phalanx pathological fracture. She is not septic or toxic at this time however looks like she is developing surrounding cellulitis, blood cultures drawn and so far one out of 2 blood cultures positive for gram-positive cocci; likely contamination, abx have been narrowed to ancef for now. Angiogram performed 12/28. Surgery has discussed with patient potential treatment options and she is considering bypass. Surgery is requesting cardiac clearance. Cardiology consult has been requested.  2. CAD, mitral valve regurgitation, chronic diastolic  dysfunction with EF 81-19%. Compensated from a heart standpoint, no rales, she does have swelling in her right foot but I think that's due to infection, monitor on aspirin, statin for secondary prevention. As needed IV Lopressor for heart rate over 100.   3. ARF. Due to #1 above. Improving with gentle hydration,monitor.  4. Dyslipidemia on statin.  5. GERD. On PPI.    Family Communication  :  Son at bedside  updated on plan of care and questions answered  Code Status :  DNR  Diet : Diet Heart Room service appropriate? Yes; Fluid consistency: Thin   Disposition Plan  :  TBD  Consults  :  Surgery  Procedures  :      DVT Prophylaxis  :    Heparin    Lab Results  Component Value Date   PLT 382 12/28/2015    Inpatient Medications  Scheduled Meds: . amiodarone  100 mg Oral Daily  . amLODipine  2.5 mg Oral Daily  . aspirin EC  81 mg Oral Daily  . atorvastatin  20 mg Oral QODAY  .  ceFAZolin (ANCEF) IV  1 g Intravenous Q8H  . heparin 6000 unit irrigation   Irrigation Once  . heparin  5,000 Units Subcutaneous Q8H  . levothyroxine  75 mcg Oral QAC breakfast  . pantoprazole  40 mg Oral Daily  . polyethylene glycol  17 g Oral BID  . vitamin B-12  1,000 mcg Oral Daily   Continuous Infusions: . sodium chloride 100 mL/hr at 12/31/15 0747   PRN Meds:.albuterol, hydrALAZINE, HYDROcodone-acetaminophen, metoprolol, ondansetron **OR** ondansetron (ZOFRAN) IV  Antibiotics  :    Anti-infectives    Start     Dose/Rate Route Frequency Ordered Stop   12/31/15 1230  ceFAZolin (ANCEF) IVPB 1 g/50 mL premix     1 g 100 mL/hr over 30 Minutes Intravenous Every 8 hours 12/31/15 1105     12/28/15 1230  vancomycin (VANCOCIN) 500 mg in sodium chloride 0.9 % 100 mL IVPB  Status:  Discontinued     500 mg 100 mL/hr over 60 Minutes Intravenous Every 24 hours 12/27/15 1525 12/28/15 0952   12/28/15 1230  vancomycin (VANCOCIN) IVPB 750 mg/150 ml premix  Status:  Discontinued     750 mg 150  mL/hr over 60 Minutes Intravenous Every 24 hours 12/28/15 0952 12/31/15 1047   12/27/15 2030  piperacillin-tazobactam (ZOSYN) IVPB 3.375 g  Status:  Discontinued     3.375 g 12.5 mL/hr over 240 Minutes Intravenous Every 8 hours 12/27/15 1425 12/31/15 1047   12/27/15 1230  piperacillin-tazobactam (ZOSYN) IVPB 3.375 g     3.375 g 100 mL/hr over 30 Minutes Intravenous  Once 12/27/15 1217 12/27/15 1311   12/27/15 1230  vancomycin (VANCOCIN) IVPB 1000 mg/200 mL premix     1,000 mg 200 mL/hr over 60 Minutes Intravenous  Once 12/27/15 1217 12/27/15 1339         Objective:   Vitals:   12/30/15 2200 12/31/15 0200 12/31/15 0600 12/31/15 1309  BP: (!) 146/55 (!) 160/56 (!) 149/60 (!) 146/51  Pulse: 60 68 74 (!) 54  Resp: 16 17 16 18   Temp: 98.1 F (36.7 C) 98.3 F (36.8 C) 98.3 F (36.8 C) (!) 96.1 F (35.6 C)  TempSrc: Oral Oral Oral Oral  SpO2: 100% 100% 100% 100%  Weight:      Height:        Wt Readings from Last 3 Encounters:  12/30/15 50.8 kg (112 lb)  09/21/15 48.5 kg (107 lb)  06/24/15 48.5 kg (107 lb)     Intake/Output Summary (Last 24 hours) at 12/31/15 1612 Last data filed at 12/31/15 1600  Gross per 24 hour  Intake             2930 ml  Output              250 ml  Net  2680 ml     Physical Exam  Awake Alert, Oriented X 3, No new F.N deficits, Normal affect Superior.AT,PERRAL Supple Neck,No JVD, No cervical lymphadenopathy appriciated.  Symmetrical Chest wall movement, Good air movement bilaterally, CTAB RRR,No Gallops,Rubs, +ve mitral valve systolic murmur, No Parasternal Heave +ve B.Sounds, Abd Soft, No tenderness, No organomegaly appriciated, No rebound - guarding or rigidity. No Cyanosis, Clubbing or edema, No new Rash or bruise    Rt toe/foot        Data Review:    CBC  Recent Labs Lab 12/27/15 1225 12/28/15 0603  WBC 10.3 7.1  HGB 11.5* 10.0*  HCT 35.2* 30.6*  PLT 418* 382  MCV 97.2 96.2  MCH 31.8 31.4  MCHC 32.7 32.7  RDW 12.9  12.9  LYMPHSABS 1.0  --   MONOABS 1.0  --   EOSABS 0.0  --   BASOSABS 0.0  --     Chemistries   Recent Labs Lab 12/27/15 1225 12/28/15 0603 12/31/15 1058  NA 140 138 138  K 4.5 3.9 3.4*  CL 101 106 109  CO2 28 25 23   GLUCOSE 92 98 105*  BUN 25* 19 13  CREATININE 1.42* 1.28* 1.22*  CALCIUM 8.8* 8.1* 8.2*   ------------------------------------------------------------------------------------------------------------------ No results for input(s): CHOL, HDL, LDLCALC, TRIG, CHOLHDL, LDLDIRECT in the last 72 hours.  No results found for: HGBA1C ------------------------------------------------------------------------------------------------------------------ No results for input(s): TSH, T4TOTAL, T3FREE, THYROIDAB in the last 72 hours.  Invalid input(s): FREET3 ------------------------------------------------------------------------------------------------------------------ No results for input(s): VITAMINB12, FOLATE, FERRITIN, TIBC, IRON, RETICCTPCT in the last 72 hours.  Coagulation profile  Recent Labs Lab 12/27/15 1428  INR 1.15    No results for input(s): DDIMER in the last 72 hours.  Cardiac Enzymes No results for input(s): CKMB, TROPONINI, MYOGLOBIN in the last 168 hours.  Invalid input(s): CK ------------------------------------------------------------------------------------------------------------------    Component Value Date/Time   BNP 270.0 (H) 12/27/2015 1226    Micro Results Recent Results (from the past 240 hour(s))  Blood culture (routine x 2)     Status: Abnormal   Collection Time: 12/27/15 12:25 PM  Result Value Ref Range Status   Specimen Description RIGHT ANTECUBITAL  Final   Special Requests BOTTLES DRAWN AEROBIC AND ANAEROBIC [facility] EACH  Final   Culture  Setup Time   Final    GRAM POSITIVE COCCI RECOVERED FROM ANAEROBIC BOTTLE Gram Stain Report Called to,Read Back By and Verified With: STONE,B AT 1020 ON 12/28/15 BY BAYSE,L    Culture (A)   Final    STAPHYLOCOCCUS LUGDUNENSIS SUSCEPTIBILITIES PERFORMED ON PREVIOUS CULTURE WITHIN THE LAST 5 DAYS. [facility]    Report Status 12/30/2015 FINAL  Final  Blood culture (routine x 2)     Status: Abnormal   Collection Time: 12/27/15 12:26 PM  Result Value Ref Range Status   Specimen Description LEFT ANTECUBITAL  Final   Special Requests BOTTLES DRAWN AEROBIC AND ANAEROBIC [facility]CC EACH  Final   Culture  Setup Time   Final    GRAM POSITIVE COCCI Gram Stain Report Called to,Read Back By and Verified With: GLENN,T AT 0703 ON 12/28/15 BY HUFFINES,S . CRITICAL RESULT CALLED TO, READ BACK BY AND VERIFIED WITH: K PITTMAN,PHARMD AT 1438 12/28/15 BY L BENFIELD [facility]    Culture STAPHYLOCOCCUS LUGDUNENSIS (A)  Final   Report Status 12/30/2015 FINAL  Final   Organism ID, Bacteria STAPHYLOCOCCUS LUGDUNENSIS  Final      Susceptibility   Staphylococcus lugdunensis - MIC*  CIPROFLOXACIN <=0.5 SENSITIVE Sensitive     ERYTHROMYCIN <=0.25 SENSITIVE Sensitive     GENTAMICIN <=0.5 SENSITIVE Sensitive     OXACILLIN 2 SENSITIVE Sensitive     TETRACYCLINE <=1 SENSITIVE Sensitive     VANCOMYCIN <=0.5 SENSITIVE Sensitive     TRIMETH/SULFA <=10 SENSITIVE Sensitive     CLINDAMYCIN <=0.25 SENSITIVE Sensitive     RIFAMPIN <=0.5 SENSITIVE Sensitive     Inducible Clindamycin NEGATIVE Sensitive     * STAPHYLOCOCCUS LUGDUNENSIS  Blood Culture ID Panel (Reflexed)     Status: Abnormal   Collection Time: 12/27/15 12:26 PM  Result Value Ref Range Status   Enterococcus species NOT DETECTED NOT DETECTED Final   Listeria monocytogenes NOT DETECTED NOT DETECTED Final   Staphylococcus species DETECTED (A) NOT DETECTED Final    Comment: CRITICAL RESULT CALLED TO, READ BACK BY AND VERIFIED WITH: K PITTMAN,PHARMD AT 1438 12/28/15 BY L BENFIELD    Staphylococcus aureus NOT DETECTED NOT DETECTED Final   Methicillin resistance NOT DETECTED NOT DETECTED Final    Streptococcus species NOT DETECTED NOT DETECTED Final   Streptococcus agalactiae NOT DETECTED NOT DETECTED Final   Streptococcus pneumoniae NOT DETECTED NOT DETECTED Final   Streptococcus pyogenes NOT DETECTED NOT DETECTED Final   Acinetobacter baumannii NOT DETECTED NOT DETECTED Final   Enterobacteriaceae species NOT DETECTED NOT DETECTED Final   Enterobacter cloacae complex NOT DETECTED NOT DETECTED Final   Escherichia coli NOT DETECTED NOT DETECTED Final   Klebsiella oxytoca NOT DETECTED NOT DETECTED Final   Klebsiella pneumoniae NOT DETECTED NOT DETECTED Final   Proteus species NOT DETECTED NOT DETECTED Final   Serratia marcescens NOT DETECTED NOT DETECTED Final   Haemophilus influenzae NOT DETECTED NOT DETECTED Final   Neisseria meningitidis NOT DETECTED NOT DETECTED Final   Pseudomonas aeruginosa NOT DETECTED NOT DETECTED Final   Candida albicans NOT DETECTED NOT DETECTED Final   Candida glabrata NOT DETECTED NOT DETECTED Final   Candida krusei NOT DETECTED NOT DETECTED Final   Candida parapsilosis NOT DETECTED NOT DETECTED Final   Candida tropicalis NOT DETECTED NOT DETECTED Final    Comment: Performed at [facility]  Surgical pcr screen     Status: None   Collection Time: 12/30/15  2:20 AM  Result Value Ref Range Status   MRSA, PCR NEGATIVE NEGATIVE Final   Staphylococcus aureus NEGATIVE NEGATIVE Final    Comment:        The Xpert SA Assay (FDA approved for NASAL specimens in patients over 45 years of age), is one component of a comprehensive surveillance program.  Test performance has been validated by [facility] for patients greater than or equal to 33 year old. It is not intended to diagnose infection nor to guide or monitor treatment.     Radiology Reports US Arterial Seg Single  Result Date: 12/28/2015 CLINICAL DATA:  Nonhealing right great toe wound, gangrene, osteomyelitis EXAM: NONINVASIVE PHYSIOLOGIC VASCULAR STUDY OF BILATERAL LOWER EXTREMITIES  TECHNIQUE: Evaluation of both lower extremities were performed at rest, including calculation of ankle-brachial indices with single level Doppler, pressure and pulse volume recording. COMPARISON:  None available FINDINGS: Right ABI:  0.58 Left ABI:  0.86 Right Lower Extremity: Irregular monophasic right posterior tibial waveform. Biphasic right dorsalis pedis waveform. Markedly decreased right ABI measuring 0.58 indicative of significant or moderate range right lower extremity peripheral vascular disease. Left Lower Extremity: Biphasic left posterior tibial and dorsalis pedis waveforms. Mildly decreased ABI measuring 0.86. Suspect nonocclusive left lower extremity vascular disease. IMPRESSION: Right  ABI 0.58 indicative of moderate range right lower extremity peripheral vascular disease. Minor decrease in the left ABI as above. Electronically Signed   By: Judie PetitM.  Shick M.D.   On: 12/28/2015 12:21   [place] Lower Ext Art Right  Result Date: 12/28/2015 CLINICAL DATA:  Right great toe gangrene and osteomyelitis. EXAM: UNILATERAL RIGHT LOWER EXTREMITY ARTERIAL DUPLEX SCAN TECHNIQUE: Gray-scale sonography as well as color Doppler and duplex ultrasound was performed to evaluate the arteries of the lower extremity. COMPARISON:  ABI examination 12/28/2015 FINDINGS: Atherosclerotic calcifications in the right common femoral artery with a monophasic waveform. Deep femoral artery is patent with monophasic waveform. Calcifications and plaque in the right SFA. Monophasic waveforms throughout the right SFA. Peak systolic velocity in the proximal right SFA is 133 cm/sec and the peak systolic velocity in the mid SFA is 221 cm/sec. Atherosclerotic calcifications in the right popliteal artery with monophasic waveform. Monophasic waveform in the right peroneal artery. Monophasic waveform in the right posterior tibial artery. The distal right posterior tibial artery may be occluded. Monophasic waveform in the anterior tibial artery.  Markedly elevated peak systolic velocity in the proximal anterior tibial artery measuring up to 496 cm/sec, this is compatible with stenosis. IMPRESSION: Diffuse atherosclerotic disease throughout the right lower extremity arteries. Right runoff disease demonstrated by stenosis in the right anterior tibial artery and occlusion or minimal flow in the distal posterior tibial artery. At least mild stenosis in the mid right SFA. Monophasic waveforms throughout the right lower extremity may signify underlying inflow disease. Electronically Signed   By: Richarda OverlieAdam  Henn M.D.   On: 12/28/2015 14:41   Dg Chest Port 1 View  Result Date: 12/27/2015 CLINICAL DATA:  Cough.  Right great toe infection. EXAM: PORTABLE CHEST 1 VIEW COMPARISON:  PA and lateral chest 05/14/2009. Single-view of the chest 05/10/2009 and 04/25/2007. FINDINGS: There is cardiomegaly and vascular congestion. No consolidative process, pneumothorax or effusion. IMPRESSION: Cardiomegaly and pulmonary vascular congestion. Electronically Signed   By: Drusilla Kannerhomas  Dalessio M.D.   On: 12/27/2015 14:48   Dg Foot Complete Right  Result Date: 12/27/2015 CLINICAL DATA:  The patient tried her great toenail her of this week with onset of swelling, wound in bowel odor. The toe is discolored. Initial encounter. EXAM: RIGHT FOOT COMPLETE - 3+ VIEW COMPARISON:  None. FINDINGS: Soft tissues of the great toe are markedly swollen. There is bony destructive change throughout the distal phalanx a comminuted fracture that is transverse in orientation through the metaphysis and extends the articular surface. Bones are osteopenic. No other acute bony abnormality is seen. IMPRESSION: Findings most consistent with osteomyelitis and pathologic fracture of the distal phalanx of the right great toe. Electronically Signed   By: Drusilla Kannerhomas  Dalessio M.D.   On: 12/27/2015 13:17    Time Spent in minutes  25 minutes   Chaya JanHERNANDEZ ACOSTA,ESTELA M.D on 12/31/2015 at 4:12 PM  Between 7am to  7pm - Pager - [phone]  After 7pm go to [email or web address] - password [email or web address]  [facility] -  Office  [phone]

## 2016-01-01 LAB — BASIC METABOLIC PANEL
Anion gap: 5 (ref 5–15)
BUN: 13 mg/dL (ref 6–20)
CALCIUM: 8.2 mg/dL — AB (ref 8.9–10.3)
CO2: 24 mmol/L (ref 22–32)
Chloride: 112 mmol/L — ABNORMAL HIGH (ref 101–111)
Creatinine, Ser: 1.24 mg/dL — ABNORMAL HIGH (ref 0.44–1.00)
GFR calc Af Amer: 42 mL/min — ABNORMAL LOW (ref >60)
GFR calc non Af Amer: 36 mL/min — ABNORMAL LOW (ref >60)
GLUCOSE: 94 mg/dL (ref 65–99)
Potassium: 3.5 mmol/L (ref 3.5–5.1)
Sodium: 141 mmol/L (ref 135–145)

## 2016-01-01 NOTE — Progress Notes (Signed)
PROGRESS NOTE                                                                                                                                                                                                             Patient Demographics:    [name], is a 82 y.o. female, DOB - 06/18/1920, JYN:[id number]  Admit date - 12/27/2015   Admitting Physician Leroy SeaPrashant K Singh, MD  Outpatient Primary MD for the patient is Colette RibasGOLDING, JOHN CABOT, MD  LOS - 5  Chief Complaint  Patient presents with  . Toe Injury       Brief Narrative   [name]  is a 82 y.o. female, With history of mitral valve regurgitation, CAD with obstructive disease but on medical treatment, chronic diastolic dysfunction last EF around 65%, paroxysmal atrial fibrillation not on anticoagulation due to fall risk and age, [place] 2 score of at least 4, hypertension, dyslipidemia who lives at home and walks with a cane, she was admitted with right toe gangrene with osteomyelitis and right toe distal phalanx pathological fracture.     Subjective:    [name] today has, No headache, No chest pain, No abdominal pain - No Nausea, No new weakness tingling or numbness, No Cough - SOB. No foot pain.   Assessment  & Plan :      1. Right big toe gangrene with possible underlying right big toe osteomyelitis and distal phalanx pathological fracture. She is not septic or toxic at this time however looks like she is developing surrounding cellulitis, blood cultures drawn and so far one out of 2 blood cultures positive for gram-positive cocci; likely contamination, abx have been narrowed to ancef for now. Angiogram performed 12/28. Surgery has discussed with patient potential treatment options and she is considering bypass. Surgery is requesting cardiac clearance. Per cardiology no further cardiac preop testing is required. Dr. Earlene Plateravis has discussed with vascular  surgery in [facility], Dr. Myra GianottiBrabham, who has accepted to see the patient upon transfer for consideration of bypass of right leg.  2. CAD, mitral valve regurgitation, chronic diastolic dysfunction with EF 60-65%. Compensated from a heart standpoint, no rales, she does have swelling in her right foot but I think that's due to infection, monitor on aspirin, statin for secondary prevention. As needed IV Lopressor for heart rate over 100.  3. ARF. Due to #1 above. Improving with gentle hydration,monitor.  4. Dyslipidemia on statin.  5. GERD. On PPI.    Family Communication  :  Multiple family members at bedside updated on plan of care and questions answered  Code Status :  DNR  Diet : Diet Heart Room service appropriate? Yes; Fluid consistency: Thin   Disposition Plan  :  Transfer to [facility] for vascular surgery consultation  Consults  :  Surgery  Procedures  :  Angiogram    DVT Prophylaxis  :    Heparin    Lab Results  Component Value Date   PLT 382 12/28/2015    Inpatient Medications  Scheduled Meds: . amiodarone  100 mg Oral Daily  . amLODipine  2.5 mg Oral Daily  . aspirin EC  81 mg Oral Daily  . atorvastatin  20 mg Oral QODAY  .  ceFAZolin (ANCEF) IV  1 g Intravenous Q8H  . heparin  5,000 Units Subcutaneous Q8H  . levothyroxine  75 mcg Oral QAC breakfast  . pantoprazole  40 mg Oral Daily  . polyethylene glycol  17 g Oral BID  . vitamin B-12  1,000 mcg Oral Daily   Continuous Infusions: . sodium chloride 100 mL/hr at 01/01/16 1549   PRN Meds:.albuterol, hydrALAZINE, HYDROcodone-acetaminophen, metoprolol, ondansetron **OR** ondansetron (ZOFRAN) IV  Antibiotics  :    Anti-infectives    Start     Dose/Rate Route Frequency Ordered Stop   12/31/15 1230  ceFAZolin (ANCEF) IVPB 1 g/50 mL premix     1 g 100 mL/hr over 30 Minutes Intravenous Every 8 hours 12/31/15 1105     12/28/15 1230  vancomycin (VANCOCIN) 500 mg in sodium chloride 0.9 % 100 mL IVPB   Status:  Discontinued     500 mg 100 mL/hr over 60 Minutes Intravenous Every 24 hours 12/27/15 1525 12/28/15 0952   12/28/15 1230  vancomycin (VANCOCIN) IVPB 750 mg/150 ml premix  Status:  Discontinued     750 mg 150 mL/hr over 60 Minutes Intravenous Every 24 hours 12/28/15 0952 12/31/15 1047   12/27/15 2030  piperacillin-tazobactam (ZOSYN) IVPB 3.375 g  Status:  Discontinued     3.375 g 12.5 mL/hr over 240 Minutes Intravenous Every 8 hours 12/27/15 1425 12/31/15 1047   12/27/15 1230  piperacillin-tazobactam (ZOSYN) IVPB 3.375 g     3.375 g 100 mL/hr over 30 Minutes Intravenous  Once 12/27/15 1217 12/27/15 1311   12/27/15 1230  vancomycin (VANCOCIN) IVPB 1000 mg/200 mL premix     1,000 mg 200 mL/hr over 60 Minutes Intravenous  Once 12/27/15 1217 12/27/15 1339         Objective:   Vitals:   12/31/15 1309 12/31/15 2056 01/01/16 0541 01/01/16 1514  BP: (!) 146/51 (!) 141/53 (!) 155/55 (!) 168/54  Pulse: (!) 54 (!) 47 (!) 57 (!) 55  Resp: 18 16 18 18   Temp: (!) 96.1 F (35.6 C) 98 F (36.7 C) 98.4 F (36.9 C) 97.9 F (36.6 C)  TempSrc: Oral Oral Oral Oral  SpO2: 100% 100% 100% 100%  Weight:   55.2 kg (121 lb 9.6 oz)   Height:        Wt Readings from Last 3 Encounters:  01/01/16 55.2 kg (121 lb 9.6 oz)  09/21/15 48.5 kg (107 lb)  06/24/15 48.5 kg (107 lb)     Intake/Output Summary (Last 24 hours) at 01/01/16 1619 Last data filed at 01/01/16 1519  Gross per 24 hour  Intake  2621.67 ml  Output              300 ml  Net          2321.67 ml     Physical Exam  Awake Alert, Oriented X 3, No new F.N deficits, Normal affect [place].AT,PERRAL Supple Neck,No JVD, No cervical lymphadenopathy appriciated.  Symmetrical Chest wall movement, Good air movement bilaterally, CTAB RRR,No Gallops,Rubs, +ve mitral valve systolic murmur, No Parasternal Heave +ve B.Sounds, Abd Soft, No tenderness, No organomegaly appriciated, No rebound - guarding or rigidity. No Cyanosis, Clubbing  or edema, No new Rash or bruise    Rt toe/foot        Data Review:    CBC  Recent Labs Lab 12/27/15 1225 12/28/15 0603  WBC 10.3 7.1  HGB 11.5* 10.0*  HCT 35.2* 30.6*  PLT 418* 382  MCV 97.2 96.2  MCH 31.8 31.4  MCHC 32.7 32.7  RDW 12.9 12.9  LYMPHSABS 1.0  --   MONOABS 1.0  --   EOSABS 0.0  --   BASOSABS 0.0  --     Chemistries   Recent Labs Lab 12/27/15 1225 12/28/15 0603 12/31/15 1058 01/01/16 0632  NA 140 138 138 141  K 4.5 3.9 3.4* 3.5  CL 101 106 109 112*  CO2 28 25 23 24   GLUCOSE 92 98 105* 94  BUN 25* 19 13 13   CREATININE 1.42* 1.28* 1.22* 1.24*  CALCIUM 8.8* 8.1* 8.2* 8.2*   ------------------------------------------------------------------------------------------------------------------ No results for input(s): CHOL, HDL, LDLCALC, TRIG, CHOLHDL, LDLDIRECT in the last 72 hours.  No results found for: HGBA1C ------------------------------------------------------------------------------------------------------------------ No results for input(s): TSH, T4TOTAL, T3FREE, THYROIDAB in the last 72 hours.  Invalid input(s): FREET3 ------------------------------------------------------------------------------------------------------------------ No results for input(s): VITAMINB12, FOLATE, FERRITIN, TIBC, IRON, RETICCTPCT in the last 72 hours.  Coagulation profile  Recent Labs Lab 12/27/15 1428  INR 1.15    No results for input(s): DDIMER in the last 72 hours.  Cardiac Enzymes No results for input(s): CKMB, TROPONINI, MYOGLOBIN in the last 168 hours.  Invalid input(s): CK ------------------------------------------------------------------------------------------------------------------    Component Value Date/Time   BNP 270.0 (H) 12/27/2015 1226    Micro Results Recent Results (from the past 240 hour(s))  Blood culture (routine x 2)     Status: Abnormal   Collection Time: 12/27/15 12:25 PM  Result Value Ref Range Status   Specimen  Description RIGHT ANTECUBITAL  Final   Special Requests BOTTLES DRAWN AEROBIC AND ANAEROBIC [facility] EACH  Final   Culture  Setup Time   Final    GRAM POSITIVE COCCI RECOVERED FROM ANAEROBIC BOTTLE Gram Stain Report Called to,Read Back By and Verified With: STONE,B AT 1020 ON 12/28/15 BY BAYSE,L    Culture (A)  Final    STAPHYLOCOCCUS LUGDUNENSIS SUSCEPTIBILITIES PERFORMED ON PREVIOUS CULTURE WITHIN THE LAST 5 DAYS. Performed at [facility]    Report Status 12/30/2015 FINAL  Final  Blood culture (routine x 2)     Status: Abnormal   Collection Time: 12/27/15 12:26 PM  Result Value Ref Range Status   Specimen Description LEFT ANTECUBITAL  Final   Special Requests BOTTLES DRAWN AEROBIC AND ANAEROBIC [facility] EACH  Final   Culture  Setup Time   Final    GRAM POSITIVE COCCI Gram Stain Report Called to,Read Back By and Verified With: GLENN,T AT 0703 ON 12/28/15 BY HUFFINES,S . CRITICAL RESULT CALLED TO, READ BACK BY AND VERIFIED WITH: K PITTMAN,PHARMD AT 1438 12/28/15 BY L BENFIELD Performed at [facility]  Culture STAPHYLOCOCCUS LUGDUNENSIS (A)  Final   Report Status 12/30/2015 FINAL  Final   Organism ID, Bacteria STAPHYLOCOCCUS LUGDUNENSIS  Final      Susceptibility   Staphylococcus lugdunensis - MIC*    CIPROFLOXACIN <=0.5 SENSITIVE Sensitive     ERYTHROMYCIN <=0.25 SENSITIVE Sensitive     GENTAMICIN <=0.5 SENSITIVE Sensitive     OXACILLIN 2 SENSITIVE Sensitive     TETRACYCLINE <=1 SENSITIVE Sensitive     VANCOMYCIN <=0.5 SENSITIVE Sensitive     TRIMETH/SULFA <=10 SENSITIVE Sensitive     CLINDAMYCIN <=0.25 SENSITIVE Sensitive     RIFAMPIN <=0.5 SENSITIVE Sensitive     Inducible Clindamycin NEGATIVE Sensitive     * STAPHYLOCOCCUS LUGDUNENSIS  Blood Culture ID Panel (Reflexed)     Status: Abnormal   Collection Time: 12/27/15 12:26 PM  Result Value Ref Range Status   Enterococcus species NOT DETECTED NOT DETECTED Final   Listeria monocytogenes NOT DETECTED NOT DETECTED  Final   Staphylococcus species DETECTED (A) NOT DETECTED Final    Comment: CRITICAL RESULT CALLED TO, READ BACK BY AND VERIFIED WITH: K PITTMAN,PHARMD AT 1438 12/28/15 BY L BENFIELD    Staphylococcus aureus NOT DETECTED NOT DETECTED Final   Methicillin resistance NOT DETECTED NOT DETECTED Final   Streptococcus species NOT DETECTED NOT DETECTED Final   Streptococcus agalactiae NOT DETECTED NOT DETECTED Final   Streptococcus pneumoniae NOT DETECTED NOT DETECTED Final   Streptococcus pyogenes NOT DETECTED NOT DETECTED Final   Acinetobacter baumannii NOT DETECTED NOT DETECTED Final   Enterobacteriaceae species NOT DETECTED NOT DETECTED Final   Enterobacter cloacae complex NOT DETECTED NOT DETECTED Final   Escherichia coli NOT DETECTED NOT DETECTED Final   Klebsiella oxytoca NOT DETECTED NOT DETECTED Final   Klebsiella pneumoniae NOT DETECTED NOT DETECTED Final   Proteus species NOT DETECTED NOT DETECTED Final   Serratia marcescens NOT DETECTED NOT DETECTED Final   Haemophilus influenzae NOT DETECTED NOT DETECTED Final   Neisseria meningitidis NOT DETECTED NOT DETECTED Final   Pseudomonas aeruginosa NOT DETECTED NOT DETECTED Final   Candida albicans NOT DETECTED NOT DETECTED Final   Candida glabrata NOT DETECTED NOT DETECTED Final   Candida krusei NOT DETECTED NOT DETECTED Final   Candida parapsilosis NOT DETECTED NOT DETECTED Final   Candida tropicalis NOT DETECTED NOT DETECTED Final    Comment: Performed at [facility]  Surgical pcr screen     Status: None   Collection Time: 12/30/15  2:20 AM  Result Value Ref Range Status   MRSA, PCR NEGATIVE NEGATIVE Final   Staphylococcus aureus NEGATIVE NEGATIVE Final    Comment:        The Xpert SA Assay (FDA approved for NASAL specimens in patients over 15 years of age), is one component of a comprehensive surveillance program.  Test performance has been validated by [facility] for patients greater than or equal to 1 year  old. It is not intended to diagnose infection nor to guide or monitor treatment.     Radiology Reports [place] Reflux Bilat  Result Date: 01/01/2016 CLINICAL DATA:  Right toe gangrene. Please evaluate patency and size of right greater saphenous vein for potential venous bypass. EXAM: RIGHT LOWER EXTREMITY VENOUS MAPPING. TECHNIQUE: Gray-scale sonography with graded compression, as well as color Doppler and duplex ultrasound were performed to evaluate the lower extremity deep venous systems from the level of the common femoral vein and including the common femoral, femoral, profunda femoral, popliteal and calf veins including the posterior tibial,  peroneal and gastrocnemius veins when visible. The superficial great saphenous vein was also interrogated. Spectral Doppler was utilized to evaluate flow at rest and with distal augmentation maneuvers in the common femoral, femoral and popliteal veins. COMPARISON:  None. FINDINGS: The greater saphenous vein appears widely patent throughout its imaged course. The greater saphenous vein measures 0.5 cm proximally (image 5), approximately 0.5 cm at the level of the distal thigh (image 17), 0.4 cm at the level the proximal calf (image 21) and 0.3 cm at the level of the distal calf (image 29). The right common femoral vein  appears widely patent where imaged. IMPRESSION: Wide patency of the right greater saphenous vein with measurements as above. Electronically Signed   By: Simonne ComeJohn  Watts M.D.   On: 01/01/2016 07:56   [place] Arterial Seg Single  Result Date: 12/28/2015 CLINICAL DATA:  Nonhealing right great toe wound, gangrene, osteomyelitis EXAM: NONINVASIVE PHYSIOLOGIC VASCULAR STUDY OF BILATERAL LOWER EXTREMITIES TECHNIQUE: Evaluation of both lower extremities were performed at rest, including calculation of ankle-brachial indices with single level Doppler, pressure and pulse volume recording. COMPARISON:  None available FINDINGS: Right ABI:  0.58 Left ABI:  0.86  Right Lower Extremity: Irregular monophasic right posterior tibial waveform. Biphasic right dorsalis pedis waveform. Markedly decreased right ABI measuring 0.58 indicative of significant or moderate range right lower extremity peripheral vascular disease. Left Lower Extremity: Biphasic left posterior tibial and dorsalis pedis waveforms. Mildly decreased ABI measuring 0.86. Suspect nonocclusive left lower extremity vascular disease. IMPRESSION: Right ABI 0.58 indicative of moderate range right lower extremity peripheral vascular disease. Minor decrease in the left ABI as above. Electronically Signed   By: Judie PetitM.  Shick M.D.   On: 12/28/2015 12:21   [place] Lower Ext Art Right  Result Date: 12/28/2015 CLINICAL DATA:  Right great toe gangrene and osteomyelitis. EXAM: UNILATERAL RIGHT LOWER EXTREMITY ARTERIAL DUPLEX SCAN TECHNIQUE: Gray-scale sonography as well as color Doppler and duplex ultrasound was performed to evaluate the arteries of the lower extremity. COMPARISON:  ABI examination 12/28/2015 FINDINGS: Atherosclerotic calcifications in the right common femoral artery with a monophasic waveform. Deep femoral artery is patent with monophasic waveform. Calcifications and plaque in the right SFA. Monophasic waveforms throughout the right SFA. Peak systolic velocity in the proximal right SFA is 133 cm/sec and the peak systolic velocity in the mid SFA is 221 cm/sec. Atherosclerotic calcifications in the right popliteal artery with monophasic waveform. Monophasic waveform in the right peroneal artery. Monophasic waveform in the right posterior tibial artery. The distal right posterior tibial artery may be occluded. Monophasic waveform in the anterior tibial artery. Markedly elevated peak systolic velocity in the proximal anterior tibial artery measuring up to 496 cm/sec, this is compatible with stenosis. IMPRESSION: Diffuse atherosclerotic disease throughout the right lower extremity arteries. Right runoff disease  demonstrated by stenosis in the right anterior tibial artery and occlusion or minimal flow in the distal posterior tibial artery. At least mild stenosis in the mid right SFA. Monophasic waveforms throughout the right lower extremity may signify underlying inflow disease. Electronically Signed   By: Richarda OverlieAdam  Henn M.D.   On: 12/28/2015 14:41   Dg Chest Port 1 View  Result Date: 12/27/2015 CLINICAL DATA:  Cough.  Right great toe infection. EXAM: PORTABLE CHEST 1 VIEW COMPARISON:  PA and lateral chest 05/14/2009. Single-view of the chest 05/10/2009 and 04/25/2007. FINDINGS: There is cardiomegaly and vascular congestion. No consolidative process, pneumothorax or effusion. IMPRESSION: Cardiomegaly and pulmonary vascular congestion. Electronically Signed   By: Drusilla Kannerhomas  Dalessio M.D.  On: 12/27/2015 14:48   Dg Foot Complete Right  Result Date: 12/27/2015 CLINICAL DATA:  The patient tried her great toenail her of this week with onset of swelling, wound in bowel odor. The toe is discolored. Initial encounter. EXAM: RIGHT FOOT COMPLETE - 3+ VIEW COMPARISON:  None. FINDINGS: Soft tissues of the great toe are markedly swollen. There is bony destructive change throughout the distal phalanx a comminuted fracture that is transverse in orientation through the metaphysis and extends the articular surface. Bones are osteopenic. No other acute bony abnormality is seen. IMPRESSION: Findings most consistent with osteomyelitis and pathologic fracture of the distal phalanx of the right great toe. Electronically Signed   By: Drusilla Kanner M.D.   On: 12/27/2015 13:17    Time Spent in minutes  25 minutes   Chaya Jan M.D on 01/01/2016 at 4:19 PM  Between 7am to 7pm - Pager - [phone]  After 7pm go to [email or web address] - password [facility]  [facility] -  Office  [phone]

## 2016-01-01 NOTE — Progress Notes (Signed)
[facility] per Dr. Ardyth HarpsHernandez.  Pt's IV site patent and WDL.  Pt's VSS for transfer.  [facility] transportation arranged and report given.  Verbalized understanding.  Report given to Christel MormonZee, RN on [address].  Verbalized understanding.  Pt and family updated regarding patient's plan of care.  Verbalized understanding.  Currently awaiting arrival of [facility].

## 2016-01-02 DIAGNOSIS — I998 Other disorder of circulatory system: Secondary | ICD-10-CM

## 2016-01-02 DIAGNOSIS — M86172 Other acute osteomyelitis, left ankle and foot: Secondary | ICD-10-CM

## 2016-01-02 MED ORDER — CEFAZOLIN IN D5W 1 GM/50ML IV SOLN
1.0000 g | Freq: Three times a day (TID) | INTRAVENOUS | Status: DC
  Administered 2016-01-02 – 2016-01-03 (×4): 1 g via INTRAVENOUS
  Filled 2016-01-02 (×6): qty 50

## 2016-01-02 NOTE — Progress Notes (Signed)
Painted right gangrenous toe with betadine per MD order.

## 2016-01-02 NOTE — Progress Notes (Signed)
PROGRESS NOTE                                                                                                                                                                                                             Patient Demographics:    [name], is a 82 y.o. female, DOB - 05/08/1920, ZOX:[id number]  Admit date - 12/27/2015   Admitting Physician Leroy Sea, MD  Outpatient Primary MD for the patient is Colette Ribas, MD  LOS - 6  Chief Complaint  Patient presents with  . Toe Injury       Brief Narrative   [name]  is a 82 y.o. female, With history of mitral valve regurgitation, CAD with obstructive disease but on medical treatment, chronic diastolic dysfunction last EF around 65%, paroxysmal atrial fibrillation not on anticoagulation due to fall risk and age, [place] vasc 2 score of at least 4, hypertension, dyslipidemia who lives at home and walks with a cane, she was admitted with right toe gangrene with osteomyelitis and right toe distal phalanx pathological fracture.     Subjective:   Feels well, no chest pain, dyspnea  Assessment  & Plan :      1. PVD withRight big toe gangrene -with right big toe osteomyelitis and distal phalanx pathological fracture -positive blood Cx with staph, repeat blood cx for clearance -s/p VVS consulting, planning angiogram tuesday -seen by Cards, no further cardiac workup needed -continue Ancef  2. Staph lugdenensis bacteremia -STAPHYLOCOCCUS LUGDUNENSIS in 2/2 blood cultures from 12/25 on admission-not contaminant -sensitive to oxacillin, continue IV ancef day 7 now -will repeat Blood Cx for clearance, since 2/2 not contamination -I called and d/w ID Dr.Snider, recommended ECHO/repeat Blood Cx and atleast 4week course of IV Ancef for this  3. CAD, mitral valve regurgitation, chronic diastolic dysfunction with EF 60-65%.  -Compensated from a cardiac  standpoint, -stop IVF  3. ARF. Due to #1 above. Improving with gentle hydration -stop IVF  4. Dyslipidemia on statin.  5. GERD. On PPI.  Family Communication  :  Grand daughter at bedside Code Status :  DNR Diet : Diet Heart Room service appropriate? Yes; Fluid consistency: Thin   Disposition Plan  :  Home pending VVS  Consults  :  Surgery  Procedures  :  Angiogram    DVT Prophylaxis  :  Heparin    Lab Results  Component Value Date   PLT 382 12/28/2015    Inpatient Medications  Scheduled Meds: . amiodarone  100 mg Oral Daily  . amLODipine  2.5 mg Oral Daily  . aspirin EC  81 mg Oral Daily  . atorvastatin  20 mg Oral QODAY  .  ceFAZolin (ANCEF) IV  1 g Intravenous Q8H  . heparin  5,000 Units Subcutaneous Q8H  . levothyroxine  75 mcg Oral QAC breakfast  . pantoprazole  40 mg Oral Daily  . polyethylene glycol  17 g Oral BID  . vitamin B-12  1,000 mcg Oral Daily   Continuous Infusions: . sodium chloride 10 mL/hr at 01/02/16 1002   PRN Meds:.albuterol, hydrALAZINE, HYDROcodone-acetaminophen, metoprolol, ondansetron **OR** ondansetron (ZOFRAN) IV  Antibiotics  :    Anti-infectives    Start     Dose/Rate Route Frequency Ordered Stop   01/02/16 1600  ceFAZolin (ANCEF) IVPB 1 g/50 mL premix     1 g 100 mL/hr over 30 Minutes Intravenous Every 8 hours 01/02/16 1204     12/31/15 1230  ceFAZolin (ANCEF) IVPB 1 g/50 mL premix  Status:  Discontinued     1 g 100 mL/hr over 30 Minutes Intravenous Every 8 hours 12/31/15 1105 01/02/16 1204   12/28/15 1230  vancomycin (VANCOCIN) 500 mg in sodium chloride 0.9 % 100 mL IVPB  Status:  Discontinued     500 mg 100 mL/hr over 60 Minutes Intravenous Every 24 hours 12/27/15 1525 12/28/15 0952   12/28/15 1230  vancomycin (VANCOCIN) IVPB 750 mg/150 ml premix  Status:  Discontinued     750 mg 150 mL/hr over 60 Minutes Intravenous Every 24 hours 12/28/15 0952 12/31/15 1047   12/27/15 2030  piperacillin-tazobactam (ZOSYN) IVPB  3.375 g  Status:  Discontinued     3.375 g 12.5 mL/hr over 240 Minutes Intravenous Every 8 hours 12/27/15 1425 12/31/15 1047   12/27/15 1230  piperacillin-tazobactam (ZOSYN) IVPB 3.375 g     3.375 g 100 mL/hr over 30 Minutes Intravenous  Once 12/27/15 1217 12/27/15 1311   12/27/15 1230  vancomycin (VANCOCIN) IVPB 1000 mg/200 mL premix     1,000 mg 200 mL/hr over 60 Minutes Intravenous  Once 12/27/15 1217 12/27/15 1339         Objective:   Vitals:   01/01/16 1704 01/01/16 2220 01/02/16 0534 01/02/16 1251  BP: (!) 189/53 (!) 174/60 (!) 170/58 (!) 146/52  Pulse: (!) 51 (!) 56 (!) 58 (!) 52  Resp:  17 17   Temp: 98.1 F (36.7 C) 98.3 F (36.8 C) 98.3 F (36.8 C) 98.3 F (36.8 C)  TempSrc: Oral Oral Oral Oral  SpO2: 100% 99% 97% 100%  Weight:   54.8 kg (120 lb 12.6 oz)   Height:        Wt Readings from Last 3 Encounters:  01/02/16 54.8 kg (120 lb 12.6 oz)  09/21/15 48.5 kg (107 lb)  06/24/15 48.5 kg (107 lb)     Intake/Output Summary (Last 24 hours) at 01/02/16 1422 Last data filed at 01/02/16 1203  Gross per 24 hour  Intake          2831.67 ml  Output              550 ml  Net          2281.67 ml     Physical Exam  Awake Alert, Oriented X 3,no distress [place].AT,PERRAL Supple Neck,No JVD, No cervical lymphadenopathy appriciated.  Symmetrical  Chest wall movement, Good air movement bilaterally, CTAB RRR,No Gallops,Rubs, +ve mitral valve systolic murmur, No Parasternal Heave +ve B.Sounds, Abd Soft, No tenderness, No organomegaly appriciated, No rebound - guarding or rigidity. R foot great toe with gangrenous changes Rt toe/foot        Data Review:    CBC  Recent Labs Lab 12/27/15 1225 12/28/15 0603  WBC 10.3 7.1  HGB 11.5* 10.0*  HCT 35.2* 30.6*  PLT 418* 382  MCV 97.2 96.2  MCH 31.8 31.4  MCHC 32.7 32.7  RDW 12.9 12.9  LYMPHSABS 1.0  --   MONOABS 1.0  --   EOSABS 0.0  --   BASOSABS 0.0  --     Chemistries   Recent Labs Lab 12/27/15 1225  12/28/15 0603 12/31/15 1058 01/01/16 0632  NA 140 138 138 141  K 4.5 3.9 3.4* 3.5  CL 101 106 109 112*  CO2 28 25 23 24   GLUCOSE 92 98 105* 94  BUN 25* 19 13 13   CREATININE 1.42* 1.28* 1.22* 1.24*  CALCIUM 8.8* 8.1* 8.2* 8.2*   ------------------------------------------------------------------------------------------------------------------ No results for input(s): CHOL, HDL, LDLCALC, TRIG, CHOLHDL, LDLDIRECT in the last 72 hours.  No results found for: HGBA1C ------------------------------------------------------------------------------------------------------------------ No results for input(s): TSH, T4TOTAL, T3FREE, THYROIDAB in the last 72 hours.  Invalid input(s): FREET3 ------------------------------------------------------------------------------------------------------------------ No results for input(s): VITAMINB12, FOLATE, FERRITIN, TIBC, IRON, RETICCTPCT in the last 72 hours.  Coagulation profile  Recent Labs Lab 12/27/15 1428  INR 1.15    No results for input(s): DDIMER in the last 72 hours.  Cardiac Enzymes No results for input(s): CKMB, TROPONINI, MYOGLOBIN in the last 168 hours.  Invalid input(s): CK ------------------------------------------------------------------------------------------------------------------    Component Value Date/Time   BNP 270.0 (H) 12/27/2015 1226    Micro Results Recent Results (from the past 240 hour(s))  Blood culture (routine x 2)     Status: Abnormal   Collection Time: 12/27/15 12:25 PM  Result Value Ref Range Status   Specimen Description RIGHT ANTECUBITAL  Final   Special Requests BOTTLES DRAWN AEROBIC AND ANAEROBIC [facility] EACH  Final   Culture  Setup Time   Final    GRAM POSITIVE COCCI RECOVERED FROM ANAEROBIC BOTTLE Gram Stain Report Called to,Read Back By and Verified With: STONE,B AT 1020 ON 12/28/15 BY BAYSE,L    Culture (A)  Final    STAPHYLOCOCCUS LUGDUNENSIS SUSCEPTIBILITIES PERFORMED ON PREVIOUS CULTURE  WITHIN THE LAST 5 DAYS. Performed at [facility]    Report Status 12/30/2015 FINAL  Final  Blood culture (routine x 2)     Status: Abnormal   Collection Time: 12/27/15 12:26 PM  Result Value Ref Range Status   Specimen Description LEFT ANTECUBITAL  Final   Special Requests BOTTLES DRAWN AEROBIC AND ANAEROBIC [facility] EACH  Final   Culture  Setup Time   Final    GRAM POSITIVE COCCI Gram Stain Report Called to,Read Back By and Verified With: GLENN,T AT 0703 ON 12/28/15 BY HUFFINES,S . CRITICAL RESULT CALLED TO, READ BACK BY AND VERIFIED WITH: K PITTMAN,PHARMD AT 1438 12/28/15 BY L BENFIELD Performed at [facility]    Culture STAPHYLOCOCCUS LUGDUNENSIS (A)  Final   Report Status 12/30/2015 FINAL  Final   Organism ID, Bacteria STAPHYLOCOCCUS LUGDUNENSIS  Final      Susceptibility   Staphylococcus lugdunensis - MIC*    CIPROFLOXACIN <=0.5 SENSITIVE Sensitive     ERYTHROMYCIN <=0.25 SENSITIVE Sensitive     GENTAMICIN <=0.5 SENSITIVE Sensitive     OXACILLIN  2 SENSITIVE Sensitive     TETRACYCLINE <=1 SENSITIVE Sensitive     VANCOMYCIN <=0.5 SENSITIVE Sensitive     TRIMETH/SULFA <=10 SENSITIVE Sensitive     CLINDAMYCIN <=0.25 SENSITIVE Sensitive     RIFAMPIN <=0.5 SENSITIVE Sensitive     Inducible Clindamycin NEGATIVE Sensitive     * STAPHYLOCOCCUS LUGDUNENSIS  Blood Culture ID Panel (Reflexed)     Status: Abnormal   Collection Time: 12/27/15 12:26 PM  Result Value Ref Range Status   Enterococcus species NOT DETECTED NOT DETECTED Final   Listeria monocytogenes NOT DETECTED NOT DETECTED Final   Staphylococcus species DETECTED (A) NOT DETECTED Final    Comment: CRITICAL RESULT CALLED TO, READ BACK BY AND VERIFIED WITH: K PITTMAN,PHARMD AT 1438 12/28/15 BY L BENFIELD    Staphylococcus aureus NOT DETECTED NOT DETECTED Final   Methicillin resistance NOT DETECTED NOT DETECTED Final   Streptococcus species NOT DETECTED NOT DETECTED Final   Streptococcus agalactiae NOT DETECTED  NOT DETECTED Final   Streptococcus pneumoniae NOT DETECTED NOT DETECTED Final   Streptococcus pyogenes NOT DETECTED NOT DETECTED Final   Acinetobacter baumannii NOT DETECTED NOT DETECTED Final   Enterobacteriaceae species NOT DETECTED NOT DETECTED Final   Enterobacter cloacae complex NOT DETECTED NOT DETECTED Final   Escherichia coli NOT DETECTED NOT DETECTED Final   Klebsiella oxytoca NOT DETECTED NOT DETECTED Final   Klebsiella pneumoniae NOT DETECTED NOT DETECTED Final   Proteus species NOT DETECTED NOT DETECTED Final   Serratia marcescens NOT DETECTED NOT DETECTED Final   Haemophilus influenzae NOT DETECTED NOT DETECTED Final   Neisseria meningitidis NOT DETECTED NOT DETECTED Final   Pseudomonas aeruginosa NOT DETECTED NOT DETECTED Final   Candida albicans NOT DETECTED NOT DETECTED Final   Candida glabrata NOT DETECTED NOT DETECTED Final   Candida krusei NOT DETECTED NOT DETECTED Final   Candida parapsilosis NOT DETECTED NOT DETECTED Final   Candida tropicalis NOT DETECTED NOT DETECTED Final    Comment: [facility]  Surgical pcr screen     Status: None   Collection Time: 12/30/15  2:20 AM  Result Value Ref Range Status   MRSA, PCR NEGATIVE NEGATIVE Final   Staphylococcus aureus NEGATIVE NEGATIVE Final    Comment:        The Xpert SA Assay (FDA approved for NASAL specimens in patients over 80 years of age), is one component of a comprehensive surveillance program.  Test performance has been validated by [facility] for patients greater than or equal to 10157 year old. It is not intended to diagnose infection nor to guide or monitor treatment.     Radiology Reports [place] Che[place] Ven Reflux Bilat  Result Date: 01/01/2016 CLINICAL DATA:  Right toe gangrene. Please evaluate patency and size of right greater saphenous vein for potential venous bypass. EXAM: RIGHT LOWER EXTREMITY VENOUS MAPPING. TECHNIQUE: Gray-scale sonography with graded compression, as well as  color Doppler and duplex ultrasound were performed to evaluate the lower extremity deep venous systems from the level of the common femoral vein and including the common femoral, femoral, profunda femoral, popliteal and calf veins including the posterior tibial, peroneal and gastrocnemius veins when visible. The superficial great saphenous vein was also interrogated. Spectral Doppler was utilized to evaluate flow at rest and with distal augmentation maneuvers in the common femoral, femoral and popliteal veins. COMPARISON:  None. FINDINGS: The greater saphenous vein appears widely patent throughout its imaged course. The greater saphenous vein measures 0.5 cm proximally (image 5), approximately 0.5  cm at the level of the distal thigh (image 17), 0.4 cm at the level the proximal calf (image 21) and 0.3 cm at the level of the distal calf (image 29). The right common femoral vein  appears widely patent where imaged. IMPRESSION: Wide patency of the right greater saphenous vein with measurements as above. Electronically Signed   By: Simonne Come M.D.   On: 01/01/2016 07:56   US Arterial Seg Single  Result Date: 12/28/2015 CLINICAL DATA:  Nonhealing right great toe wound, gangrene, osteomyelitis EXAM: NONINVASIVE PHYSIOLOGIC VASCULAR STUDY OF BILATERAL LOWER EXTREMITIES TECHNIQUE: Evaluation of both lower extremities were performed at rest, including calculation of ankle-brachial indices with single level Doppler, pressure and pulse volume recording. COMPARISON:  None available FINDINGS: Right ABI:  0.58 Left ABI:  0.86 Right Lower Extremity: Irregular monophasic right posterior tibial waveform. Biphasic right dorsalis pedis waveform. Markedly decreased right ABI measuring 0.58 indicative of significant or moderate range right lower extremity peripheral vascular disease. Left Lower Extremity: Biphasic left posterior tibial and dorsalis pedis waveforms. Mildly decreased ABI measuring 0.86. Suspect nonocclusive left  lower extremity vascular disease. IMPRESSION: Right ABI 0.58 indicative of moderate range right lower extremity peripheral vascular disease. Minor decrease in the left ABI as above. Electronically Signed   By: Judie Petit.  Shick M.D.   On: 12/28/2015 12:21   [place] Lower Ext Art Right  Result Date: 12/28/2015 CLINICAL DATA:  Right great toe gangrene and osteomyelitis. EXAM: UNILATERAL RIGHT LOWER EXTREMITY ARTERIAL DUPLEX SCAN TECHNIQUE: Gray-scale sonography as well as color Doppler and duplex ultrasound was performed to evaluate the arteries of the lower extremity. COMPARISON:  ABI examination 12/28/2015 FINDINGS: Atherosclerotic calcifications in the right common femoral artery with a monophasic waveform. Deep femoral artery is patent with monophasic waveform. Calcifications and plaque in the right SFA. Monophasic waveforms throughout the right SFA. Peak systolic velocity in the proximal right SFA is 133 cm/sec and the peak systolic velocity in the mid SFA is 221 cm/sec. Atherosclerotic calcifications in the right popliteal artery with monophasic waveform. Monophasic waveform in the right peroneal artery. Monophasic waveform in the right posterior tibial artery. The distal right posterior tibial artery may be occluded. Monophasic waveform in the anterior tibial artery. Markedly elevated peak systolic velocity in the proximal anterior tibial artery measuring up to 496 cm/sec, this is compatible with stenosis. IMPRESSION: Diffuse atherosclerotic disease throughout the right lower extremity arteries. Right runoff disease demonstrated by stenosis in the right anterior tibial artery and occlusion or minimal flow in the distal posterior tibial artery. At least mild stenosis in the mid right SFA. Monophasic waveforms throughout the right lower extremity may signify underlying inflow disease. Electronically Signed   By: Richarda Overlie M.D.   On: 12/28/2015 14:41   Dg Chest Port 1 View  Result Date: 12/27/2015 CLINICAL DATA:   Cough.  Right great toe infection. EXAM: PORTABLE CHEST 1 VIEW COMPARISON:  PA and lateral chest 05/14/2009. Single-view of the chest 05/10/2009 and 04/25/2007. FINDINGS: There is cardiomegaly and vascular congestion. No consolidative process, pneumothorax or effusion. IMPRESSION: Cardiomegaly and pulmonary vascular congestion. Electronically Signed   By: Drusilla Kanner M.D.   On: 12/27/2015 14:48   Dg Foot Complete Right  Result Date: 12/27/2015 CLINICAL DATA:  The patient tried her great toenail her of this week with onset of swelling, wound in bowel odor. The toe is discolored. Initial encounter. EXAM: RIGHT FOOT COMPLETE - 3+ VIEW COMPARISON:  None. FINDINGS: Soft tissues of the great toe are markedly swollen. There  is bony destructive change throughout the distal phalanx a comminuted fracture that is transverse in orientation through the metaphysis and extends the articular surface. Bones are osteopenic. No other acute bony abnormality is seen. IMPRESSION: Findings most consistent with osteomyelitis and pathologic fracture of the distal phalanx of the right great toe. Electronically Signed   By: Drusilla Kanner M.D.   On: 12/27/2015 13:17    Time Spent in minutes  25 minutes   Eulas Schweitzer M.D on 01/02/2016 at 2:22 PM  Between 7am to 7pm - Pager - [phone]  After 7pm go to [email or web address] - password [facility]  [facility] -  Office  [phone]

## 2016-01-02 NOTE — Consult Note (Signed)
[facility] [place] CONSULT [place]   MRN : [id number]  Reason for Consult: Arterial isufficency Referring Physician: Dr. Isaac LaudEvan Davis  History of Present Illness: 82 y/[facility] Logan BoresEvans [facility] concerning right ischemic changes.   Patient has a 2-3 week history of right great toe edema and erythema after she trimmed an ingrown toenail. which caused her to loose her great toe nail.  Her prior function was normal.  Such as grocery shopping and cleaning her own house as well as cooking.  She lives independently across the street from her son.   Past medical history includes: PAF managed with Pacerone, CAD, HTN managed with Norvasc, hyperlipidemia managed with Lipitor and history of MI and carotid artery Occlusion.     Current Facility-Administered Medications  Medication Dose Route Frequency Provider Last Rate Last Dose  . 0.9 %  sodium chloride infusion   Intravenous Continuous Ancil LinseyJason Evan Davis, MD 100 mL/hr at 01/02/16 0238    . albuterol (PROVENTIL) (2.5 MG/3ML) 0.083% nebulizer solution 2.5 mg  2.5 mg Nebulization Q4H PRN Leroy SeaPrashant K Singh, MD      . amiodarone (PACERONE) tablet 100 mg  100 mg Oral Daily Leroy SeaPrashant K Singh, MD   100 mg at 01/01/16 1132  . amLODipine (NORVASC) tablet 2.5 mg  2.5 mg Oral Daily Leroy SeaPrashant K Singh, MD   2.5 mg at 01/01/16 1131  . aspirin EC tablet 81 mg  81 mg Oral Daily Leroy SeaPrashant K Singh, MD   81 mg at 01/01/16 1131  . atorvastatin (LIPITOR) tablet 20 mg  20 mg Oral QODAY Leroy SeaPrashant K Singh, MD   20 mg at 01/01/16 1133  . ceFAZolin (ANCEF) IVPB 1 g/50 mL premix  1 g Intravenous Q8H Estela Isaiah BlakesY Hernandez Acosta, MD   1 g at 01/02/16 [id number]  . heparin injection 5,000 Units  5,000 Units Subcutaneous Q8H Leroy SeaPrashant K Singh, MD   5,000 Units at 01/02/16 0702  . hydrALAZINE (APRESOLINE) injection 10 mg  10 mg Intravenous Q6H PRN Leroy SeaPrashant K Singh, MD   10 mg at 01/01/16 1733  . HYDROcodone-acetaminophen  (NORCO/VICODIN) 5-325 MG per tablet 1 tablet  1 tablet Oral Q6H PRN Leroy SeaPrashant K Singh, MD      . levothyroxine (SYNTHROID, LEVOTHROID) tablet 75 mcg  75 mcg Oral QAC breakfast Leroy SeaPrashant K Singh, MD   75 mcg at 01/02/16 [id number]  . metoprolol (LOPRESSOR) injection 5 mg  5 mg Intravenous Q4H PRN Leroy SeaPrashant K Singh, MD      . ondansetron (ZOFRAN) tablet 4 mg  4 mg Oral Q6H PRN Leroy SeaPrashant K Singh, MD       Or  . ondansetron (ZOFRAN) injection 4 mg  4 mg Intravenous Q6H PRN Leroy SeaPrashant K Singh, MD      . pantoprazole (PROTONIX) EC tablet 40 mg  40 mg Oral Daily Leroy SeaPrashant K Singh, MD   40 mg at 01/01/16 1133  . polyethylene glycol (MIRALAX / GLYCOLAX) packet 17 g  17 g Oral BID Leroy SeaPrashant K Singh, MD   17 g at 12/31/15 2236  . vitamin B-12 (CYANOCOBALAMIN) tablet 1,000 mcg  1,000 mcg Oral Daily Leroy SeaPrashant K Singh, MD   1,000 mcg at 01/01/16 1131    Pt meds include: Statin :Yes Betablocker: No ASA: Yes Other anticoagulants/antiplatelets: none  Past Medical History:  Diagnosis Date  . Acid reflux   . Carotid artery occlusion    minimal  . Coronary artery disease   . Hypercholesteremia   .  Hypertension   . Myocardial infarct   . PAF (paroxysmal atrial fibrillation) (HCC)   . Skin disorder   . Systolic murmur   . Thyroid disease     Past Surgical History:  Procedure Laterality Date  . ABDOMINAL HYSTERECTOMY    . CARDIAC CATHETERIZATION    . CARDIOVERSION  2012  . LOWER EXTREMITY ANGIOGRAM Right 12/30/2015   Procedure: RIGHT LOWER EXTREMITY ANGIOGRAM;  Surgeon: Ancil LinseyJason Evan Davis, MD;  Location: [facility];  Service: Vascular;  Laterality: Right;  accessed through left groin area - dressed with two 2x2 and 4x4 opsite  . THYROID SURGERY      Social History Social History  Substance Use Topics  . Smoking status: Never Smoker  . Smokeless tobacco: Never Used  . Alcohol use No    Family History Family History  Problem Relation Age of Onset  . Family history unknown: Yes    No Known  Allergies   REVIEW OF SYSTEMS  General: [ ]  Weight loss, [ ]  Fever, [ ]  chills Neurologic: [ ]  Dizziness, [ ]  Blackouts, [ ]  Seizure [ ]  Stroke, [ ]  "Mini stroke", [ ]  Slurred speech, [ ]  Temporary blindness; [ ]  weakness in arms or legs, [ ]  Hoarseness [ ]  Dysphagia Cardiac: [ ]  Chest pain/pressure, [ ]  Shortness of breath at rest [ ]  Shortness of breath with exertion, [ ]  Atrial fibrillation or irregular heartbeat  Vascular: [ ]  Pain in legs with walking, [ ]  Pain in legs at rest, [ ]  Pain in legs at night, [x]  ischemic right great toe with dry gangrene  [ ]  Non-healing ulcer, [ ]  Blood clot in vein/DVT,   Pulmonary: [ ]  Home oxygen, [ ]  Productive cough, [ ]  Coughing up blood, [ ]  Asthma,  [ ]  Wheezing [ ]  COPD Musculoskeletal:  [ ]  Arthritis, [ ]  Low back pain, [ ]  Joint pain Hematologic: [ ]  Easy Bruising, [ ]  Anemia; [ ]  Hepatitis Gastrointestinal: [ ]  Blood in stool, [ ]  Gastroesophageal Reflux/heartburn, Urinary: [ ]  chronic Kidney disease, [ ]  on HD - [ ]  MWF or [ ]  TTHS, [ ]  Burning with urination, [ ]  Difficulty urinating Skin: [ ]  Rashes, [x ] Wounds Psychological: [ ]  Anxiety, [ ]  Depression  Physical Examination Vitals:   01/01/16 1514 01/01/16 1704 01/01/16 2220 01/02/16 0534  BP: (!) 168/54 (!) 189/53 (!) 174/60 (!) 170/58  Pulse: (!) 55 (!) 51 (!) 56 (!) 58  Resp: 18  17 17   Temp: 97.9 F (36.6 C) 98.1 F (36.7 C) 98.3 F (36.8 C) 98.3 F (36.8 C)  TempSrc: Oral Oral Oral Oral  SpO2: 100% 100% 99% 97%  Weight:    120 lb 12.6 oz (54.8 kg)  Height:       Body mass index is 20.1 kg/m.  General:  WDWN in NAD Gait: Normal HENT: WNL Eyes: Pupils equal Pulmonary: normal non-labored breathing , without Rales, rhonchi,  wheezing Cardiac: RRR, without  Murmurs, rubs or gallops; Abdomen: soft, NT, no masses Skin: no rashes, ulcers noted;  Positive right great toe Gangrene , no cellulitis; no open wounds;   Vascular Exam/Pulses:Palpable radial, femoral and  popliteal pulse bilaterally,Non palpable pedal pulses,   Musculoskeletal: no muscle wasting or atrophy; right LE edema  Neurologic: A&O X 3; Appropriate Affect ;  SENSATION: normal; MOTOR FUNCTION: 5/5 Symmetric Speech is fluent/normal   Significant Diagnostic Studies: CBC Lab Results  Component Value Date   WBC 7.1 12/28/2015   HGB 10.0 (L) 12/28/2015  HCT 30.6 (L) 12/28/2015   MCV 96.2 12/28/2015   PLT 382 12/28/2015    BMET    Component Value Date/Time   NA 141 01/01/2016 0632   K 3.5 01/01/2016 0632   CL 112 (H) 01/01/2016 0632   CO2 24 01/01/2016 0632   GLUCOSE 94 01/01/2016 0632   BUN 13 01/01/2016 0632   CREATININE 1.24 (H) 01/01/2016 0632   CALCIUM 8.2 (L) 01/01/2016 0632   GFRNONAA 36 (L) 01/01/2016 0632   GFRAA 42 (L) 01/01/2016 [id number]   Estimated Creatinine Clearance: 24.5 mL/min (by C-G formula based on SCr of 1.24 mg/dL (H)).  COAG Lab Results  Component Value Date   INR 1.15 12/27/2015   INR 1.90 (H) 05/15/2009   INR 1.50 (H) 05/14/2009     Non-Invasive Vascular Imaging:  LE arterial Duplex 12/28/2015 FINDINGS: Atherosclerotic calcifications in the right common femoral artery with a monophasic waveform. Deep femoral artery is patent with monophasic waveform. Calcifications and plaque in the right SFA. Monophasic waveforms throughout the right SFA. Peak systolic velocity in the proximal right SFA is 133 cm/sec and the peak systolic velocity in the mid SFA is 221 cm/sec. Atherosclerotic calcifications in the right popliteal artery with monophasic waveform. Monophasic waveform in the right peroneal artery. Monophasic waveform in the right posterior tibial artery. The distal right posterior tibial artery may be occluded. Monophasic waveform in the anterior tibial artery. Markedly elevated peak systolic velocity in the proximal anterior tibial artery measuring up to 496 cm/sec, this is compatible with stenosis.  IMPRESSION: Diffuse  atherosclerotic disease throughout the right lower extremity arteries.  Right runoff disease demonstrated by stenosis in the right anterior tibial artery and occlusion or minimal flow in the distal posterior tibial artery.  At least mild stenosis in the mid right SFA.  Monophasic waveforms throughout the right lower extremity may signify underlying inflow disease.   ASSESSMENT/PLAN:  Right great toe dry gangrene  right anterior tibial artery and occlusion or minimal flow in the distal posterior tibial artery and mild stenosis in the mid right SFA.  Options discussed with patient and son on the phone with Dr. Myra GianottiBrabham. Plan will be to perform an angiogram Tuesday with possible intervention.       I agree with the above.  I have seen and evaluated the patient.  Her right great toe will require amputation as it is non-salvagable.  I have reviewed her angiogram by Dr. Earlene Plateravis.  She has several options including surgical revascularization vs percutaneous treatment.  I would favor avoiding surgery in this 82 year old female.  He son discussed that he has decided he wants her to have a bypass.  He will be here later today and we will discuss this further.  WElls Elenora GammaBrabham  COLLINS, EMMA [facility]EN 01/02/2016 8:37 AM

## 2016-01-03 DIAGNOSIS — M86171 Other acute osteomyelitis, right ankle and foot: Secondary | ICD-10-CM

## 2016-01-03 LAB — BASIC METABOLIC PANEL
Anion gap: 6 (ref 5–15)
BUN: 11 mg/dL (ref 6–20)
CALCIUM: 8.5 mg/dL — AB (ref 8.9–10.3)
CO2: 22 mmol/L (ref 22–32)
CREATININE: 1.22 mg/dL — AB (ref 0.44–1.00)
Chloride: 113 mmol/L — ABNORMAL HIGH (ref 101–111)
GFR calc non Af Amer: 37 mL/min — ABNORMAL LOW (ref >60)
GFR, EST AFRICAN AMERICAN: 43 mL/min — AB (ref >60)
Glucose, Bld: 77 mg/dL (ref 65–99)
Potassium: 3.6 mmol/L (ref 3.5–5.1)
SODIUM: 141 mmol/L (ref 135–145)

## 2016-01-03 LAB — SURGICAL PCR SCREEN
MRSA, PCR: NEGATIVE
STAPHYLOCOCCUS AUREUS: NEGATIVE

## 2016-01-03 MED ORDER — CEFAZOLIN SODIUM-DEXTROSE 2-4 GM/100ML-% IV SOLN
2.0000 g | Freq: Two times a day (BID) | INTRAVENOUS | Status: DC
  Administered 2016-01-04 – 2016-01-07 (×7): 2 g via INTRAVENOUS
  Filled 2016-01-03 (×8): qty 100

## 2016-01-03 NOTE — Progress Notes (Signed)
PROGRESS NOTE                                                                                                                                                                                                             Patient Demographics:    [name], is a 83 y.o. female, DOB - November 16, 1920, ZOX:[id number]  Admit date - 12/27/2015   Admitting Physician Leroy Sea, MD  Outpatient Primary MD for the patient is Colette Ribas, MD  LOS - 7  Chief Complaint  Patient presents with  . Toe Injury       Brief Narrative   [name]  is a 83 y.o. female, With history of mitral valve regurgitation, CAD with obstructive disease but on medical treatment, chronic diastolic dysfunction last EF around 65%, paroxysmal atrial fibrillation not on anticoagulation due to fall risk and age, [place] vasc 2 score of at least 4, hypertension, dyslipidemia who lives at home and walks with a cane, she was admitted with right toe gangrene with osteomyelitis and right toe distal phalanx pathological fracture.     Subjective:   Feels well, no chest pain, dyspnea  Assessment  & Plan :      1. PVD withRight big toe gangrene -with right big toe osteomyelitis and distal phalanx pathological fracture -positive blood Cx with staph, repeat blood cx for clearance -s/p VVS consulting, considering surgical revascularization vs percutaneous treatment, will also need toe amputation -seen by Cards, no further cardiac workup needed -continue IV Ancef  2. Staph lugdenensis bacteremia -STAPHYLOCOCCUS LUGDUNENSIS in 2/2 blood cultures from 12/25 on admission-not contaminant -sensitive to oxacillin, continue IV ancef day 8 now -Repeat Blood Cx for clearance, from 12/31-NGTD x1day -I called and d/w ID Dr.Snider 12/31, recommended ECHO/repeat Blood Cx and atleast 4week course of IV Ancef for this, ECHO pending  3. CAD, mitral valve  regurgitation, chronic diastolic dysfunction with EF 60-65%.  -Compensated from a cardiac standpoint, -stopped IVF  3. ARF. Due to #1 above.  -Improved with gentle hydration -stopped IVF  4. Dyslipidemia on statin.  5. GERD. On PPI.  DVT proph: Hep SQ  Family Communication  :  Grand daughter at bedside 12/31 Code Status :  DNR Diet : Diet Heart Room service appropriate? Yes; Fluid consistency: Thin  Disposition Plan  :  Home pending VVS intervention  Consults  :  Surgery  Procedures  :  Angiogram   Lab Results  Component Value Date   PLT 382 12/28/2015    Inpatient Medications  Scheduled Meds: . amiodarone  100 mg Oral Daily  . amLODipine  2.5 mg Oral Daily  . aspirin EC  81 mg Oral Daily  . atorvastatin  20 mg Oral QODAY  .  ceFAZolin (ANCEF) IV  1 g Intravenous Q8H  . heparin  5,000 Units Subcutaneous Q8H  . levothyroxine  75 mcg Oral QAC breakfast  . pantoprazole  40 mg Oral Daily  . polyethylene glycol  17 g Oral BID  . vitamin B-12  1,000 mcg Oral Daily   Continuous Infusions: . sodium chloride 10 mL/hr at 01/02/16 1002   PRN Meds:.albuterol, hydrALAZINE, HYDROcodone-acetaminophen, metoprolol, ondansetron **OR** ondansetron (ZOFRAN) IV  Antibiotics  :    Anti-infectives    Start     Dose/Rate Route Frequency Ordered Stop   01/02/16 1600  ceFAZolin (ANCEF) IVPB 1 g/50 mL premix     1 g 100 mL/hr over 30 Minutes Intravenous Every 8 hours 01/02/16 1204     12/31/15 1230  ceFAZolin (ANCEF) IVPB 1 g/50 mL premix  Status:  Discontinued     1 g 100 mL/hr over 30 Minutes Intravenous Every 8 hours 12/31/15 1105 01/02/16 1204   12/28/15 1230  vancomycin (VANCOCIN) 500 mg in sodium chloride 0.9 % 100 mL IVPB  Status:  Discontinued     500 mg 100 mL/hr over 60 Minutes Intravenous Every 24 hours 12/27/15 1525 12/28/15 0952   12/28/15 1230  vancomycin (VANCOCIN) IVPB 750 mg/150 ml premix  Status:  Discontinued     750 mg 150 mL/hr over 60 Minutes Intravenous  Every 24 hours 12/28/15 0952 12/31/15 1047   12/27/15 2030  piperacillin-tazobactam (ZOSYN) IVPB 3.375 g  Status:  Discontinued     3.375 g 12.5 mL/hr over 240 Minutes Intravenous Every 8 hours 12/27/15 1425 12/31/15 1047   12/27/15 1230  piperacillin-tazobactam (ZOSYN) IVPB 3.375 g     3.375 g 100 mL/hr over 30 Minutes Intravenous  Once 12/27/15 1217 12/27/15 1311   12/27/15 1230  vancomycin (VANCOCIN) IVPB 1000 mg/200 mL premix     1,000 mg 200 mL/hr over 60 Minutes Intravenous  Once 12/27/15 1217 12/27/15 1339         Objective:   Vitals:   01/02/16 0534 01/02/16 1251 01/02/16 2242 01/03/16 0614  BP: (!) 170/58 (!) 146/52 (!) 152/55 (!) 148/59  Pulse: (!) 58 (!) 52 (!) 54 (!) 55  Resp: 17  17 18   Temp: 98.3 F (36.8 C) 98.3 F (36.8 C) 98.2 F (36.8 C) 98.1 F (36.7 C)  TempSrc: Oral Oral Oral Oral  SpO2: 97% 100% 99% 100%  Weight: 54.8 kg (120 lb 12.6 oz)   53.4 kg (117 lb 13.4 oz)  Height:        Wt Readings from Last 3 Encounters:  01/03/16 53.4 kg (117 lb 13.4 oz)  09/21/15 48.5 kg (107 lb)  06/24/15 48.5 kg (107 lb)     Intake/Output Summary (Last 24 hours) at 01/03/16 1122 Last data filed at 01/03/16 0839  Gross per 24 hour  Intake              740 ml  Output              300 ml  Net              440 ml  Physical Exam  Awake Alert, Oriented X 3,no distress, appears much younger than stated age [place].AT,PERRAL Supple Neck,No JVD, No cervical lymphadenopathy appriciated.  Symmetrical Chest wall movement, Good air movement bilaterally, CTAB RRR,No Gallops,Rubs, +ve mitral valve systolic murmur, No Parasternal Heave +ve B.Sounds, Abd Soft, No tenderness, No organomegaly appriciated, No rebound - guarding or rigidity. R foot great toe with gangrenous changes Rt toe/foot        Data Review:    CBC  Recent Labs Lab 12/27/15 1225 12/28/15 0603  WBC 10.3 7.1  HGB 11.5* 10.0*  HCT 35.2* 30.6*  PLT 418* 382  MCV 97.2 96.2  MCH 31.8 31.4    MCHC 32.7 32.7  RDW 12.9 12.9  LYMPHSABS 1.0  --   MONOABS 1.0  --   EOSABS 0.0  --   BASOSABS 0.0  --     Chemistries   Recent Labs Lab 12/27/15 1225 12/28/15 0603 12/31/15 1058 01/01/16 0632 01/03/16 0344  NA 140 138 138 141 141  K 4.5 3.9 3.4* 3.5 3.6  CL 101 106 109 112* 113*  CO2 28 25 23 24 22   GLUCOSE 92 98 105* 94 77  BUN 25* 19 13 13 11   CREATININE 1.42* 1.28* 1.22* 1.24* 1.22*  CALCIUM 8.8* 8.1* 8.2* 8.2* 8.5*   ------------------------------------------------------------------------------------------------------------------ No results for input(s): CHOL, HDL, LDLCALC, TRIG, CHOLHDL, LDLDIRECT in the last 72 hours.  No results found for: HGBA1C ------------------------------------------------------------------------------------------------------------------ No results for input(s): TSH, T4TOTAL, T3FREE, THYROIDAB in the last 72 hours.  Invalid input(s): FREET3 ------------------------------------------------------------------------------------------------------------------ No results for input(s): VITAMINB12, FOLATE, FERRITIN, TIBC, IRON, RETICCTPCT in the last 72 hours.  Coagulation profile  Recent Labs Lab 12/27/15 1428  INR 1.15    No results for input(s): DDIMER in the last 72 hours.  Cardiac Enzymes No results for input(s): CKMB, TROPONINI, MYOGLOBIN in the last 168 hours.  Invalid input(s): CK ------------------------------------------------------------------------------------------------------------------    Component Value Date/Time   BNP 270.0 (H) 12/27/2015 1226    Micro Results Recent Results (from the past 240 hour(s))  Blood culture (routine x 2)     Status: Abnormal   Collection Time: 12/27/15 12:25 PM  Result Value Ref Range Status   Specimen Description RIGHT ANTECUBITAL  Final   Special Requests BOTTLES DRAWN AEROBIC AND ANAEROBIC [facility] EACH  Final   Culture  Setup Time   Final    GRAM POSITIVE COCCI RECOVERED FROM ANAEROBIC  BOTTLE Gram Stain Report Called to,Read Back By and Verified With: STONE,B AT 1020 ON 12/28/15 BY BAYSE,L    Culture (A)  Final    STAPHYLOCOCCUS LUGDUNENSIS SUSCEPTIBILITIES PERFORMED ON PREVIOUS CULTURE WITHIN THE LAST 5 DAYS. Performed at [facility]    Report Status 12/30/2015 FINAL  Final  Blood culture (routine x 2)     Status: Abnormal   Collection Time: 12/27/15 12:26 PM  Result Value Ref Range Status   Specimen Description LEFT ANTECUBITAL  Final   Special Requests BOTTLES DRAWN AEROBIC AND ANAEROBIC [facility] EACH  Final   Culture  Setup Time   Final    GRAM POSITIVE COCCI Gram Stain Report Called to,Read Back By and Verified With: GLENN,T AT 0703 ON 12/28/15 BY HUFFINES,S . CRITICAL RESULT CALLED TO, READ BACK BY AND VERIFIED WITH: K PITTMAN,PHARMD AT 1438 12/28/15 BY L BENFIELD Performed at [facility]    Culture STAPHYLOCOCCUS LUGDUNENSIS (A)  Final   Report Status 12/30/2015 FINAL  Final   Organism ID, Bacteria STAPHYLOCOCCUS LUGDUNENSIS  Final  Susceptibility   Staphylococcus lugdunensis - MIC*    CIPROFLOXACIN <=0.5 SENSITIVE Sensitive     ERYTHROMYCIN <=0.25 SENSITIVE Sensitive     GENTAMICIN <=0.5 SENSITIVE Sensitive     OXACILLIN 2 SENSITIVE Sensitive     TETRACYCLINE <=1 SENSITIVE Sensitive     VANCOMYCIN <=0.5 SENSITIVE Sensitive     TRIMETH/SULFA <=10 SENSITIVE Sensitive     CLINDAMYCIN <=0.25 SENSITIVE Sensitive     RIFAMPIN <=0.5 SENSITIVE Sensitive     Inducible Clindamycin NEGATIVE Sensitive     * STAPHYLOCOCCUS LUGDUNENSIS  Blood Culture ID Panel (Reflexed)     Status: Abnormal   Collection Time: 12/27/15 12:26 PM  Result Value Ref Range Status   Enterococcus species NOT DETECTED NOT DETECTED Final   Listeria monocytogenes NOT DETECTED NOT DETECTED Final   Staphylococcus species DETECTED (A) NOT DETECTED Final    Comment: CRITICAL RESULT CALLED TO, READ BACK BY AND VERIFIED WITH: K PITTMAN,PHARMD AT 1438 12/28/15 BY L BENFIELD     Staphylococcus aureus NOT DETECTED NOT DETECTED Final   Methicillin resistance NOT DETECTED NOT DETECTED Final   Streptococcus species NOT DETECTED NOT DETECTED Final   Streptococcus agalactiae NOT DETECTED NOT DETECTED Final   Streptococcus pneumoniae NOT DETECTED NOT DETECTED Final   Streptococcus pyogenes NOT DETECTED NOT DETECTED Final   Acinetobacter baumannii NOT DETECTED NOT DETECTED Final   Enterobacteriaceae species NOT DETECTED NOT DETECTED Final   Enterobacter cloacae complex NOT DETECTED NOT DETECTED Final   Escherichia coli NOT DETECTED NOT DETECTED Final   Klebsiella oxytoca NOT DETECTED NOT DETECTED Final   Klebsiella pneumoniae NOT DETECTED NOT DETECTED Final   Proteus species NOT DETECTED NOT DETECTED Final   Serratia marcescens NOT DETECTED NOT DETECTED Final   Haemophilus influenzae NOT DETECTED NOT DETECTED Final   Neisseria meningitidis NOT DETECTED NOT DETECTED Final   Pseudomonas aeruginosa NOT DETECTED NOT DETECTED Final   Candida albicans NOT DETECTED NOT DETECTED Final   Candida glabrata NOT DETECTED NOT DETECTED Final   Candida krusei NOT DETECTED NOT DETECTED Final   Candida parapsilosis NOT DETECTED NOT DETECTED Final   Candida tropicalis NOT DETECTED NOT DETECTED Final    Comment: Performed at [facility]  Surgical pcr screen     Status: None   Collection Time: 12/30/15  2:20 AM  Result Value Ref Range Status   MRSA, PCR NEGATIVE NEGATIVE Final   Staphylococcus aureus NEGATIVE NEGATIVE Final    Comment:        The Xpert SA Assay (FDA approved for NASAL specimens in patients over 31 years of age), is one component of a comprehensive surveillance program.  Test performance has been validated by [facility] for patients greater than or equal to 31 year old. It is not intended to diagnose infection nor to guide or monitor treatment.     Radiology Reports [place] Reflux Bilat  Result Date: 01/01/2016 CLINICAL DATA:  Right toe gangrene.  Please evaluate patency and size of right greater saphenous vein for potential venous bypass. EXAM: RIGHT LOWER EXTREMITY VENOUS MAPPING. TECHNIQUE: Gray-scale sonography with graded compression, as well as color Doppler and duplex ultrasound were performed to evaluate the lower extremity deep venous systems from the level of the common femoral vein and including the common femoral, femoral, profunda femoral, popliteal and calf veins including the posterior tibial, peroneal and gastrocnemius veins when visible. The superficial great saphenous vein was also interrogated. Spectral Doppler was utilized to evaluate flow at rest and with distal augmentation maneuvers  in the common femoral, femoral and popliteal veins. COMPARISON:  None. FINDINGS: The greater saphenous vein appears widely patent throughout its imaged course. The greater saphenous vein measures 0.5 cm proximally (image 5), approximately 0.5 cm at the level of the distal thigh (image 17), 0.4 cm at the level the proximal calf (image 21) and 0.3 cm at the level of the distal calf (image 29). The right common femoral vein  appears widely patent where imaged. IMPRESSION: Wide patency of the right greater saphenous vein with measurements as above. Electronically Signed   By: Simonne Come M.D.   On: 01/01/2016 07:56   US Arterial Seg Single  Result Date: 12/28/2015 CLINICAL DATA:  Nonhealing right great toe wound, gangrene, osteomyelitis EXAM: NONINVASIVE PHYSIOLOGIC VASCULAR STUDY OF BILATERAL LOWER EXTREMITIES TECHNIQUE: Evaluation of both lower extremities were performed at rest, including calculation of ankle-brachial indices with single level Doppler, pressure and pulse volume recording. COMPARISON:  None available FINDINGS: Right ABI:  0.58 Left ABI:  0.86 Right Lower Extremity: Irregular monophasic right posterior tibial waveform. Biphasic right dorsalis pedis waveform. Markedly decreased right ABI measuring 0.58 indicative of significant or moderate  range right lower extremity peripheral vascular disease. Left Lower Extremity: Biphasic left posterior tibial and dorsalis pedis waveforms. Mildly decreased ABI measuring 0.86. Suspect nonocclusive left lower extremity vascular disease. IMPRESSION: Right ABI 0.58 indicative of moderate range right lower extremity peripheral vascular disease. Minor decrease in the left ABI as above. Electronically Signed   By: Judie Petit.  Shick M.D.   On: 12/28/2015 12:21   [place] Lower Ext Art Right  Result Date: 12/28/2015 CLINICAL DATA:  Right great toe gangrene and osteomyelitis. EXAM: UNILATERAL RIGHT LOWER EXTREMITY ARTERIAL DUPLEX SCAN TECHNIQUE: Gray-scale sonography as well as color Doppler and duplex ultrasound was performed to evaluate the arteries of the lower extremity. COMPARISON:  ABI examination 12/28/2015 FINDINGS: Atherosclerotic calcifications in the right common femoral artery with a monophasic waveform. Deep femoral artery is patent with monophasic waveform. Calcifications and plaque in the right SFA. Monophasic waveforms throughout the right SFA. Peak systolic velocity in the proximal right SFA is 133 cm/sec and the peak systolic velocity in the mid SFA is 221 cm/sec. Atherosclerotic calcifications in the right popliteal artery with monophasic waveform. Monophasic waveform in the right peroneal artery. Monophasic waveform in the right posterior tibial artery. The distal right posterior tibial artery may be occluded. Monophasic waveform in the anterior tibial artery. Markedly elevated peak systolic velocity in the proximal anterior tibial artery measuring up to 496 cm/sec, this is compatible with stenosis. IMPRESSION: Diffuse atherosclerotic disease throughout the right lower extremity arteries. Right runoff disease demonstrated by stenosis in the right anterior tibial artery and occlusion or minimal flow in the distal posterior tibial artery. At least mild stenosis in the mid right SFA. Monophasic waveforms throughout  the right lower extremity may signify underlying inflow disease. Electronically Signed   By: Richarda Overlie M.D.   On: 12/28/2015 14:41   Dg Chest Port 1 View  Result Date: 12/27/2015 CLINICAL DATA:  Cough.  Right great toe infection. EXAM: PORTABLE CHEST 1 VIEW COMPARISON:  PA and lateral chest 05/14/2009. Single-view of the chest 05/10/2009 and 04/25/2007. FINDINGS: There is cardiomegaly and vascular congestion. No consolidative process, pneumothorax or effusion. IMPRESSION: Cardiomegaly and pulmonary vascular congestion. Electronically Signed   By: Drusilla Kanner M.D.   On: 12/27/2015 14:48   Dg Foot Complete Right  Result Date: 12/27/2015 CLINICAL DATA:  The patient tried her great toenail her of this week  with onset of swelling, wound in bowel odor. The toe is discolored. Initial encounter. EXAM: RIGHT FOOT COMPLETE - 3+ VIEW COMPARISON:  None. FINDINGS: Soft tissues of the great toe are markedly swollen. There is bony destructive change throughout the distal phalanx a comminuted fracture that is transverse in orientation through the metaphysis and extends the articular surface. Bones are osteopenic. No other acute bony abnormality is seen. IMPRESSION: Findings most consistent with osteomyelitis and pathologic fracture of the distal phalanx of the right great toe. Electronically Signed   By: Drusilla Kannerhomas  Dalessio M.D.   On: 12/27/2015 13:17    Time Spent in minutes  25 minutes   [place] Gipe M.D on 01/03/2016 at 11:22 AM  Between 7am to 7pm - Pager - [phone]  After 7pm go to [email or web address] - password [email or web address]  [facility] -  Office  [phone]

## 2016-01-03 NOTE — Progress Notes (Signed)
Pharmacy Antibiotic Note  [name] is a 83 y.o. female admitted on 12/27/2015 with cellulitis, gangrene of toe, wound infection, (+) BCx.  Pharmacy has been consulted for Ancef dosing.    BCx 2/2 with Staph lugdunensis - MD discussed with ID over the phone who recommended a minimum of 4 weeks and to check a TTE. VVS consulted - planning angiogram and possible intervention on 1/2. SCr 1.22, CrCl~20-25 ml/min. Will adjust to q12h dosing for renal function and ease of long-term therapy if needed.   Plan: - Adjust Cefazolin to 2g IV every 12 hours - Will continue to follow renal function, culture results, LOT, and antibiotic de-escalation plans   Height: 5\' 5"  (165.1 cm) Weight: 117 lb 13.4 oz (53.4 kg) IBW/kg (Calculated) : 57  Temp (24hrs), Avg:98.1 F (36.7 C), Min:98.1 F (36.7 C), Max:98.2 F (36.8 C)   Recent Labs Lab 12/28/15 0603 12/31/15 1058 01/01/16 0632 01/03/16 0344  WBC 7.1  --   --   --   CREATININE 1.28* 1.22* 1.24* 1.22*    Estimated Creatinine Clearance: 24.3 mL/min (by C-G formula based on SCr of 1.22 mg/dL (H)).    No Known Allergies  Antimicrobials this admission:  vanc 12/25 >> 12/29 zosyn 12/25 >>12/29  Ancef 12/29 >>  Microbiology results:  12/25 BCx: 2/2 Staphylococcus lugdunensis - oxacillin-sensitive 12/31 BCx >>  Thank you for allowing pharmacy to be a part of this patient's care.  [name], PharmD, BCPS Clinical Pharmacist Pager: [phone] Clinical phone for 01/03/2016 from 7a-3:30p: [phone] If after 3:30p, please call main pharmacy at: [phone] 01/03/2016 3:01 PM

## 2016-01-04 ENCOUNTER — Encounter (HOSPITAL_COMMUNITY)
Admission: EM | Disposition: A | Discharge status: Home Health | Payer: Self-pay | Source: Home / Self Care | Attending: Internal Medicine

## 2016-01-04 ENCOUNTER — Inpatient Hospital Stay (HOSPITAL_COMMUNITY): Payer: Medicare Other

## 2016-01-04 DIAGNOSIS — R7881 Bacteremia: Secondary | ICD-10-CM

## 2016-01-04 HISTORY — PX: PERIPHERAL VASCULAR CATHETERIZATION: SHX172C

## 2016-01-04 LAB — PROTIME-INR
INR: 1.18
Prothrombin Time: 15.1 seconds (ref 11.4–15.2)

## 2016-01-04 LAB — BASIC METABOLIC PANEL
ANION GAP: 8 (ref 5–15)
ANION GAP: 7 (ref 5–15)
BUN: 10 mg/dL (ref 6–20)
BUN: 10 mg/dL (ref 6–20)
CHLORIDE: 112 mmol/L — AB (ref 101–111)
CO2: 21 mmol/L — ABNORMAL LOW (ref 22–32)
CO2: 22 mmol/L (ref 22–32)
Calcium: 8.2 mg/dL — ABNORMAL LOW (ref 8.9–10.3)
Calcium: 8.4 mg/dL — ABNORMAL LOW (ref 8.9–10.3)
Chloride: 113 mmol/L — ABNORMAL HIGH (ref 101–111)
Creatinine, Ser: 1.21 mg/dL — ABNORMAL HIGH (ref 0.44–1.00)
Creatinine, Ser: 1.23 mg/dL — ABNORMAL HIGH (ref 0.44–1.00)
GFR calc Af Amer: 42 mL/min — ABNORMAL LOW (ref >60)
GFR calc Af Amer: 43 mL/min — ABNORMAL LOW (ref >60)
GFR, EST NON AFRICAN AMERICAN: 37 mL/min — AB (ref >60)
GFR, EST NON AFRICAN AMERICAN: 37 mL/min — AB (ref >60)
Glucose, Bld: 86 mg/dL (ref 65–99)
Glucose, Bld: 96 mg/dL (ref 65–99)
POTASSIUM: 3.3 mmol/L — AB (ref 3.5–5.1)
POTASSIUM: 3.4 mmol/L — AB (ref 3.5–5.1)
SODIUM: 142 mmol/L (ref 135–145)
Sodium: 141 mmol/L (ref 135–145)

## 2016-01-04 LAB — CBC
HEMATOCRIT: 29.8 % — AB (ref 36.0–46.0)
HEMOGLOBIN: 9.7 g/dL — AB (ref 12.0–15.0)
MCH: 30.1 pg (ref 26.0–34.0)
MCHC: 32.6 g/dL (ref 30.0–36.0)
MCV: 92.5 fL (ref 78.0–100.0)
Platelets: 427 10*3/uL — ABNORMAL HIGH (ref 150–400)
RBC: 3.22 MIL/uL — ABNORMAL LOW (ref 3.87–5.11)
RDW: 13.3 % (ref 11.5–15.5)
WBC: 6.6 10*3/uL (ref 4.0–10.5)

## 2016-01-04 LAB — POCT ACTIVATED CLOTTING TIME
ACTIVATED CLOTTING TIME: 224 s
ACTIVATED CLOTTING TIME: 153 s
Activated Clotting Time: 224 seconds
Activated Clotting Time: 180 seconds

## 2016-01-04 LAB — ECHOCARDIOGRAM COMPLETE
Height: 65 in
WEIGHTICAEL: 1936 [oz_av]

## 2016-01-04 SURGERY — LOWER EXTREMITY ANGIOGRAPHY
Anesthesia: LOCAL | Laterality: Right

## 2016-01-04 MED ORDER — ANGIOPLASTY BOOK
Freq: Once | Status: AC
  Administered 2016-01-04: 21:00:00
  Filled 2016-01-04: qty 1

## 2016-01-04 MED ORDER — PHENOL 1.4 % MT LIQD
1.0000 | OROMUCOSAL | Status: DC | PRN
  Filled 2016-01-04: qty 177

## 2016-01-04 MED ORDER — NITROGLYCERIN 1 MG/10 ML FOR IR/CATH LAB
INTRA_ARTERIAL | Status: DC | PRN
Start: 2016-01-04 — End: 2016-01-04
  Administered 2016-01-04 (×3): 400 ug via INTRA_ARTERIAL

## 2016-01-04 MED ORDER — DOCUSATE SODIUM 100 MG PO CAPS
100.0000 mg | ORAL_CAPSULE | Freq: Every day | ORAL | Status: DC
  Administered 2016-01-05 – 2016-01-07 (×3): 100 mg via ORAL
  Filled 2016-01-04 (×3): qty 1

## 2016-01-04 MED ORDER — VIPERSLIDE LUBRICANT OPTIME
TOPICAL | Status: DC | PRN
  Administered 2016-01-04: 16:00:00 via SURGICAL_CAVITY

## 2016-01-04 MED ORDER — FENTANYL CITRATE (PF) 100 MCG/2ML IJ SOLN
INTRAMUSCULAR | Status: AC
  Filled 2016-01-04: qty 2

## 2016-01-04 MED ORDER — ACETAMINOPHEN 650 MG RE SUPP: 325.0000 mg | RECTAL | Status: DC | PRN

## 2016-01-04 MED ORDER — HEPARIN (PORCINE) IN NACL 2-0.9 UNIT/ML-% IJ SOLN
INTRAMUSCULAR | Status: DC | PRN
  Administered 2016-01-04: 1000 mL via INTRA_ARTERIAL

## 2016-01-04 MED ORDER — NITROGLYCERIN IN D5W 200-5 MCG/ML-% IV SOLN
INTRAVENOUS | Status: AC
  Filled 2016-01-04: qty 250

## 2016-01-04 MED ORDER — HEPARIN (PORCINE) IN NACL 2-0.9 UNIT/ML-% IJ SOLN
INTRAMUSCULAR | Status: AC
  Filled 2016-01-04: qty 1000

## 2016-01-04 MED ORDER — LIDOCAINE HCL (PF) 1 % IJ SOLN
INTRAMUSCULAR | Status: AC
  Filled 2016-01-04: qty 30

## 2016-01-04 MED ORDER — ATROPINE SULFATE 1 MG/10ML IJ SOSY
PREFILLED_SYRINGE | INTRAMUSCULAR | Status: AC
  Filled 2016-01-04: qty 10

## 2016-01-04 MED ORDER — CEFAZOLIN IN D5W 1 GM/50ML IV SOLN
1.0000 g | INTRAVENOUS | Status: DC
  Filled 2016-01-04: qty 50

## 2016-01-04 MED ORDER — HEPARIN SODIUM (PORCINE) 1000 UNIT/ML IJ SOLN
INTRAMUSCULAR | Status: DC | PRN
  Administered 2016-01-04: 1000 [IU] via INTRAVENOUS

## 2016-01-04 MED ORDER — GUAIFENESIN-DM 100-10 MG/5ML PO SYRP: 15.0000 mL | ORAL_SOLUTION | ORAL | Status: DC | PRN

## 2016-01-04 MED ORDER — LIDOCAINE HCL (PF) 1 % IJ SOLN
INTRAMUSCULAR | Status: DC | PRN
  Administered 2016-01-04: 15 mL via SUBCUTANEOUS

## 2016-01-04 MED ORDER — ACETAMINOPHEN 325 MG PO TABS
325.0000 mg | ORAL_TABLET | ORAL | Status: DC | PRN
  Administered 2016-01-06: 650 mg via ORAL
  Filled 2016-01-04: qty 2

## 2016-01-04 MED ORDER — SODIUM CHLORIDE 0.9 % IV SOLN: INTRAVENOUS | Status: DC

## 2016-01-04 MED ORDER — ALUM & MAG HYDROXIDE-SIMETH 200-200-20 MG/5ML PO SUSP: 15.0000 mL | ORAL | Status: DC | PRN

## 2016-01-04 MED ORDER — VERAPAMIL HCL 2.5 MG/ML IV SOLN
INTRAVENOUS | Status: AC
  Filled 2016-01-04: qty 2

## 2016-01-04 MED ORDER — HEPARIN SODIUM (PORCINE) 1000 UNIT/ML IJ SOLN
INTRAMUSCULAR | Status: AC
  Filled 2016-01-04: qty 1

## 2016-01-04 MED ORDER — FENTANYL CITRATE (PF) 100 MCG/2ML IJ SOLN
INTRAMUSCULAR | Status: DC | PRN
  Administered 2016-01-04 (×2): 25 ug via INTRAVENOUS

## 2016-01-04 SURGICAL SUPPLY — 24 items
BAG SNAP BAND KOVER 36X36 (MISCELLANEOUS) ×3 IMPLANT
BALLN COYOTE OTW 3X100X150 (BALLOONS) ×3
BALLOON COYOTE OTW 3X100X150 (BALLOONS) ×2 IMPLANT
CATH OMNI FLUSH 5F 65CM (CATHETERS) ×3 IMPLANT
CATH QUICKCROSS .018X135CM (MICROCATHETER) ×3 IMPLANT
COVER DOME SNAP 22 D (MISCELLANEOUS) ×3 IMPLANT
CROWN STEALTH MICRO-30 1.25MM (CATHETERS) ×3 IMPLANT
DEVICE CONTINUOUS FLUSH (MISCELLANEOUS) ×3 IMPLANT
GUIDEWIRE STR TIP .014X300X8 (WIRE) ×3 IMPLANT
KIT MICROINTRODUCER STIFF 5F (SHEATH) ×3 IMPLANT
KIT PV (KITS) ×3 IMPLANT
LUBRICANT VIPERSLIDE CORONARY (MISCELLANEOUS) ×3 IMPLANT
SHEATH PINNACLE 5F 10CM (SHEATH) ×3 IMPLANT
SHEATH PINNACLE 6F 10CM (SHEATH) ×3 IMPLANT
SHEATH PINNACLE ST 6F 65CM (SHEATH) ×3 IMPLANT
SHIELD RADPAD SCOOP 12X17 (MISCELLANEOUS) ×3 IMPLANT
SYR MEDRAD MARK V 150ML (SYRINGE) ×3 IMPLANT
TAPE VIPERTRACK RADIOPAQ (MISCELLANEOUS) ×2 IMPLANT
TAPE VIPERTRACK RADIOPAQUE (MISCELLANEOUS) ×1
TRANSDUCER W/STOPCOCK (MISCELLANEOUS) ×3 IMPLANT
TRAY PV CATH (CUSTOM PROCEDURE TRAY) ×3 IMPLANT
WIRE BENTSON .035X145CM (WIRE) ×3 IMPLANT
WIRE SPARTACORE .014X300CM (WIRE) ×3 IMPLANT
WIRE VIPER ADVANCE .017X335CM (WIRE) ×6 IMPLANT

## 2016-01-04 NOTE — Interval H&P Note (Signed)
History and Physical Interval Note:  01/04/2016 1:29 PM  [name]  has presented today for surgery, with the diagnosis of claudication  The various methods of treatment have been discussed with the patient and family. After consideration of risks, benefits and other options for treatment, the patient has consented to  Procedure(s): Lower Extremity Angiography (N/A) as a surgical intervention .  The patient's history has been reviewed, patient examined, no change in status, stable for surgery.  I have reviewed the patient's chart and labs.  Questions were answered to the patient's satisfaction.     Durene CalBrabham, [place]

## 2016-01-04 NOTE — Progress Notes (Addendum)
1345 pt to vascular via bed for angiography. NPO post mn maint. 1546 Pt was transferred to 6C03 right after the procedure. Pt's belongings was picked up by her son.

## 2016-01-04 NOTE — Op Note (Signed)
Patient name: [name] MRN: [id number] DOB: [birth year] Sex: female  12/27/2015 - 01/04/2016 Pre-operative Diagnosis: Right toe ulcer Post-operative diagnosis:  Same Surgeon:  Annamarie Major Procedure Performed:  1.  Ultrasound-guided access, left femoral artery  2.  Abdominal aortogram  3.  Right lower extremity runoff  4.  Atherectomy, right peroneal artery  5.  Angioplasty, right peroneal artery  6.  Angioplasty, right tibioperoneal trunk  7.  Intra-arterial administration of nitroglycerin    Indications:  The patient presented with gangrene to her right great toe.  She has atherosclerotic vascular disease and comes in today for intervention to improve blood flow to her foot to increase the likelihood of wound healing.  I had an extensive discussion with the son and family regarding our treatment options.  They have agreed to proceed.  Procedure:  The patient was identified in the holding area and taken to room 8.  The patient was then placed supine on the table and prepped and draped in the usual sterile fashion.  A time out was called.  Ultrasound was used to evaluate the right common femoral artery.  It was patent .  A digital ultrasound image was acquired.  A micropuncture needle was used to access the right common femoral artery under ultrasound guidance.  An 018 wire was advanced without resistance and a micropuncture sheath was placed.  The 018 wire was removed and a benson wire was placed.  The micropuncture sheath was exchanged for a 5 french sheath.  An omniflush catheter was advanced over the wire to the level of L-1.  An abdominal angiogram was obtained.  Next, using the omniflush catheter and a benson wire, the aortic bifurcation was crossed and the catheter was placed into theright external iliac artery and right runoff was obtained.    Findings:   Aortogram:  No significant renal artery stenosis.  The infrarenal abdominal aorta is heavily calcified but patent.  Bilateral  common and external iliac arteries are widely patent.  Right Lower Extremity:  The right common femoral and profunda femoral artery are patent throughout their course.  The right superficial femoral artery is calcified.  No hemodynamically significant lesions are identified however the luminal caliber is diminished.  The popliteal artery is patent throughout it's course.  There is single vessel runoff via the peroneal artery which perfuses the foot.  There is a high-grade greater than 80% stenosis within the tibioperoneal trunk as well as the proximal peroneal artery  Left Lower Extremity:  Not evaluated  Intervention:  After the above images were acquired, the decision was made to proceed with intervention.  Over a 035 wire, a 6 French 60 cm sheath was advanced into the right superficial femoral artery.  The patient was fully heparinized.  Next I selected a V 14 wire and a 018 quick cross catheter.  I was able to navigate through the stenosis within the peroneal artery.  The quick cross catheter was then placed into the distal peroneal artery and better evaluation of the foot was obtained as well as confirmation that I remain intraluminal.  A total of 800 g of intra-arterial nitroglycerin was administered.  Next, the viper wire was inserted and  the quick cross catheter was removed.  I selected the CSI 1. 25 micro. device and performed atherectomy of the tibioperoneal trunk as well as the peroneal artery for total distance of 10 cm at low, medium, and high speed.  I then selected a 3 x 100 coyote balloon  and perform balloon angioplasty of the atherectomized area, the tibioperoneal trunk and the peroneal artery..  This was done at 7 atm for 2 minutes.  Completion imaging revealed resolution of the stenosis.  Catheters and wires were removed.  The patient taken the holding area for sheath pull once her coagulation profile corrects.  Impression:  #1  high-grade stenosis within the tibioperoneal trunk and  proximal peroneal artery successfully treated with atherectomy using a CSI 1.25 micro device and a 3 mm coyote balloon    V. Annamarie Major, M.D. [facility] Office: [phone] Pager:  [phone]

## 2016-01-04 NOTE — Progress Notes (Signed)
  Echocardiogram 2D Echocardiogram has been performed.  Juanelle Trueheart L Androw 01/04/2016, 12:26 PM

## 2016-01-04 NOTE — Progress Notes (Signed)
Site area: left groin  Site Prior to Removal:  Level 0  Pressure Applied For 15 MINUTES    Minutes Beginning at 20:15  Manual:   Yes.    Patient Status During Pull:  WNL  Post Pull Groin Site:  Level 0  Post Pull Instructions Given:  Yes.    Post Pull Pulses Present:  Yes.    Dressing Applied:  Yes.    Comments:  Pt tolerated well.

## 2016-01-04 NOTE — H&P (View-Only) (Signed)
[facility] [place] CONSULT [place]   MRN : [id number]  Reason for Consult: Arterial isufficency Referring Physician: Dr. Isaac LaudEvan Davis  History of Present Illness: 83 y/[facility] Logan BoresEvans [facility] concerning right ischemic changes.   Patient has a 2-3 week history of right great toe edema and erythema after she trimmed an ingrown toenail. which caused her to loose her great toe nail.  Her prior function was normal.  Such as grocery shopping and cleaning her own house as well as cooking.  She lives independently across the street from her son.   Past medical history includes: PAF managed with Pacerone, CAD, HTN managed with Norvasc, hyperlipidemia managed with Lipitor and history of MI and carotid artery Occlusion.     Current Facility-Administered Medications  Medication Dose Route Frequency Provider Last Rate Last Dose  . 0.9 %  sodium chloride infusion   Intravenous Continuous Ancil LinseyJason Evan Davis, MD 100 mL/hr at 01/02/16 0238    . albuterol (PROVENTIL) (2.5 MG/3ML) 0.083% nebulizer solution 2.5 mg  2.5 mg Nebulization Q4H PRN Leroy SeaPrashant K Singh, MD      . amiodarone (PACERONE) tablet 100 mg  100 mg Oral Daily Leroy SeaPrashant K Singh, MD   100 mg at 01/01/16 1132  . amLODipine (NORVASC) tablet 2.5 mg  2.5 mg Oral Daily Leroy SeaPrashant K Singh, MD   2.5 mg at 01/01/16 1131  . aspirin EC tablet 81 mg  81 mg Oral Daily Leroy SeaPrashant K Singh, MD   81 mg at 01/01/16 1131  . atorvastatin (LIPITOR) tablet 20 mg  20 mg Oral QODAY Leroy SeaPrashant K Singh, MD   20 mg at 01/01/16 1133  . ceFAZolin (ANCEF) IVPB 1 g/50 mL premix  1 g Intravenous Q8H Estela Isaiah BlakesY Hernandez Acosta, MD   1 g at 01/02/16 [id number]  . heparin injection 5,000 Units  5,000 Units Subcutaneous Q8H Leroy SeaPrashant K Singh, MD   5,000 Units at 01/02/16 0702  . hydrALAZINE (APRESOLINE) injection 10 mg  10 mg Intravenous Q6H PRN Leroy SeaPrashant K Singh, MD   10 mg at 01/01/16 1733  . HYDROcodone-acetaminophen  (NORCO/VICODIN) 5-325 MG per tablet 1 tablet  1 tablet Oral Q6H PRN Leroy SeaPrashant K Singh, MD      . levothyroxine (SYNTHROID, LEVOTHROID) tablet 75 mcg  75 mcg Oral QAC breakfast Leroy SeaPrashant K Singh, MD   75 mcg at 01/02/16 [id number]  . metoprolol (LOPRESSOR) injection 5 mg  5 mg Intravenous Q4H PRN Leroy SeaPrashant K Singh, MD      . ondansetron (ZOFRAN) tablet 4 mg  4 mg Oral Q6H PRN Leroy SeaPrashant K Singh, MD       Or  . ondansetron (ZOFRAN) injection 4 mg  4 mg Intravenous Q6H PRN Leroy SeaPrashant K Singh, MD      . pantoprazole (PROTONIX) EC tablet 40 mg  40 mg Oral Daily Leroy SeaPrashant K Singh, MD   40 mg at 01/01/16 1133  . polyethylene glycol (MIRALAX / GLYCOLAX) packet 17 g  17 g Oral BID Leroy SeaPrashant K Singh, MD   17 g at 12/31/15 2236  . vitamin B-12 (CYANOCOBALAMIN) tablet 1,000 mcg  1,000 mcg Oral Daily Leroy SeaPrashant K Singh, MD   1,000 mcg at 01/01/16 1131    Pt meds include: Statin :Yes Betablocker: No ASA: Yes Other anticoagulants/antiplatelets: none  Past Medical History:  Diagnosis Date  . Acid reflux   . Carotid artery occlusion    minimal  . Coronary artery disease   . Hypercholesteremia   .  Hypertension   . Myocardial infarct   . PAF (paroxysmal atrial fibrillation) (HCC)   . Skin disorder   . Systolic murmur   . Thyroid disease     Past Surgical History:  Procedure Laterality Date  . ABDOMINAL HYSTERECTOMY    . CARDIAC CATHETERIZATION    . CARDIOVERSION  2012  . LOWER EXTREMITY ANGIOGRAM Right 12/30/2015   Procedure: RIGHT LOWER EXTREMITY ANGIOGRAM;  Surgeon: Ancil LinseyJason Evan Davis, MD;  Location: [facility];  Service: Vascular;  Laterality: Right;  accessed through left groin area - dressed with two 2x2 and 4x4 opsite  . THYROID SURGERY      Social History Social History  Substance Use Topics  . Smoking status: Never Smoker  . Smokeless tobacco: Never Used  . Alcohol use No    Family History Family History  Problem Relation Age of Onset  . Family history unknown: Yes    No Known  Allergies   REVIEW OF SYSTEMS  General: [ ]  Weight loss, [ ]  Fever, [ ]  chills Neurologic: [ ]  Dizziness, [ ]  Blackouts, [ ]  Seizure [ ]  Stroke, [ ]  "Mini stroke", [ ]  Slurred speech, [ ]  Temporary blindness; [ ]  weakness in arms or legs, [ ]  Hoarseness [ ]  Dysphagia Cardiac: [ ]  Chest pain/pressure, [ ]  Shortness of breath at rest [ ]  Shortness of breath with exertion, [ ]  Atrial fibrillation or irregular heartbeat  Vascular: [ ]  Pain in legs with walking, [ ]  Pain in legs at rest, [ ]  Pain in legs at night, [x]  ischemic right great toe with dry gangrene  [ ]  Non-healing ulcer, [ ]  Blood clot in vein/DVT,   Pulmonary: [ ]  Home oxygen, [ ]  Productive cough, [ ]  Coughing up blood, [ ]  Asthma,  [ ]  Wheezing [ ]  COPD Musculoskeletal:  [ ]  Arthritis, [ ]  Low back pain, [ ]  Joint pain Hematologic: [ ]  Easy Bruising, [ ]  Anemia; [ ]  Hepatitis Gastrointestinal: [ ]  Blood in stool, [ ]  Gastroesophageal Reflux/heartburn, Urinary: [ ]  chronic Kidney disease, [ ]  on HD - [ ]  MWF or [ ]  TTHS, [ ]  Burning with urination, [ ]  Difficulty urinating Skin: [ ]  Rashes, [x ] Wounds Psychological: [ ]  Anxiety, [ ]  Depression  Physical Examination Vitals:   01/01/16 1514 01/01/16 1704 01/01/16 2220 01/02/16 0534  BP: (!) 168/54 (!) 189/53 (!) 174/60 (!) 170/58  Pulse: (!) 55 (!) 51 (!) 56 (!) 58  Resp: 18  17 17   Temp: 97.9 F (36.6 C) 98.1 F (36.7 C) 98.3 F (36.8 C) 98.3 F (36.8 C)  TempSrc: Oral Oral Oral Oral  SpO2: 100% 100% 99% 97%  Weight:    120 lb 12.6 oz (54.8 kg)  Height:       Body mass index is 20.1 kg/m.  General:  WDWN in NAD Gait: Normal HENT: WNL Eyes: Pupils equal Pulmonary: normal non-labored breathing , without Rales, rhonchi,  wheezing Cardiac: RRR, without  Murmurs, rubs or gallops; Abdomen: soft, NT, no masses Skin: no rashes, ulcers noted;  Positive right great toe Gangrene , no cellulitis; no open wounds;   Vascular Exam/Pulses:Palpable radial, femoral and  popliteal pulse bilaterally,Non palpable pedal pulses,   Musculoskeletal: no muscle wasting or atrophy; right LE edema  Neurologic: A&O X 3; Appropriate Affect ;  SENSATION: normal; MOTOR FUNCTION: 5/5 Symmetric Speech is fluent/normal   Significant Diagnostic Studies: CBC Lab Results  Component Value Date   WBC 7.1 12/28/2015   HGB 10.0 (L) 12/28/2015  HCT 30.6 (L) 12/28/2015   MCV 96.2 12/28/2015   PLT 382 12/28/2015    BMET    Component Value Date/Time   NA 141 01/01/2016 0632   K 3.5 01/01/2016 0632   CL 112 (H) 01/01/2016 0632   CO2 24 01/01/2016 0632   GLUCOSE 94 01/01/2016 0632   BUN 13 01/01/2016 0632   CREATININE 1.24 (H) 01/01/2016 0632   CALCIUM 8.2 (L) 01/01/2016 0632   GFRNONAA 36 (L) 01/01/2016 0632   GFRAA 42 (L) 01/01/2016 [id number]   Estimated Creatinine Clearance: 24.5 mL/min (by C-G formula based on SCr of 1.24 mg/dL (H)).  COAG Lab Results  Component Value Date   INR 1.15 12/27/2015   INR 1.90 (H) 05/15/2009   INR 1.50 (H) 05/14/2009     Non-Invasive Vascular Imaging:  LE arterial Duplex 12/28/2015 FINDINGS: Atherosclerotic calcifications in the right common femoral artery with a monophasic waveform. Deep femoral artery is patent with monophasic waveform. Calcifications and plaque in the right SFA. Monophasic waveforms throughout the right SFA. Peak systolic velocity in the proximal right SFA is 133 cm/sec and the peak systolic velocity in the mid SFA is 221 cm/sec. Atherosclerotic calcifications in the right popliteal artery with monophasic waveform. Monophasic waveform in the right peroneal artery. Monophasic waveform in the right posterior tibial artery. The distal right posterior tibial artery may be occluded. Monophasic waveform in the anterior tibial artery. Markedly elevated peak systolic velocity in the proximal anterior tibial artery measuring up to 496 cm/sec, this is compatible with stenosis.  IMPRESSION: Diffuse  atherosclerotic disease throughout the right lower extremity arteries.  Right runoff disease demonstrated by stenosis in the right anterior tibial artery and occlusion or minimal flow in the distal posterior tibial artery.  At least mild stenosis in the mid right SFA.  Monophasic waveforms throughout the right lower extremity may signify underlying inflow disease.   ASSESSMENT/PLAN:  Right great toe dry gangrene  right anterior tibial artery and occlusion or minimal flow in the distal posterior tibial artery and mild stenosis in the mid right SFA.  Options discussed with patient and son on the phone with Dr. Myra GianottiBrabham. Plan will be to perform an angiogram Tuesday with possible intervention.       I agree with the above.  I have seen and evaluated the patient.  Her right great toe will require amputation as it is non-salvagable.  I have reviewed her angiogram by Dr. Earlene Plateravis.  She has several options including surgical revascularization vs percutaneous treatment.  I would favor avoiding surgery in this 83 year old female.  He son discussed that he has decided he wants her to have a bypass.  He will be here later today and we will discuss this further.  WElls Elenora GammaBrabham  COLLINS, EMMA [facility]EN 01/02/2016 8:37 AM

## 2016-01-04 NOTE — Progress Notes (Addendum)
PROGRESS NOTE                                                                                                                                                                                                             Patient Demographics:    [name], is a 83 y.o. female, DOB - 06-May-1920, ZOX:[id number]  Admit date - 12/27/2015   Admitting Physician Leroy Sea, MD  Outpatient Primary MD for the patient is Colette Ribas, MD  LOS - 8  Chief Complaint  Patient presents with  . Toe Injury       Brief Narrative   [name]  is a 83 y.o. female, With history of mitral valve regurgitation, CAD with obstructive disease on medical treatment, chronic diastolic dysfunction last EF around 65%, paroxysmal atrial fibrillation not on anticoagulation due to fall risk and age, hypertension, dyslipidemia who lives at home and walks with a cane, she was admitted with right toe gangrene with osteomyelitis and right toe distal phalanx pathological fracture.  Started on IV Abx/ found to have Staph lugdenesis bacteremia from 12/25 Sent to [facility] from [facility] on 12/31 for vascular surgical eval     Subjective:   Feels well, no chest pain, dyspnea, awaiting surgery  Assessment  & Plan :      1. PVD withRight big toe gangrene -with right big toe osteomyelitis and distal phalanx pathological fracture -positive blood Cx with staph, repeat blood cx for clearance negative x48hours -s/p VVS consulting, plan for angiogram with possible intervention, will also need toe amputation -seen by Cards, no further cardiac workup needed -was on IV Vanc initially then changed to IV Ancef 12/29-continue IV Ancef    2. Staph lugdenensis bacteremia -STAPHYLOCOCCUS LUGDUNENSIS in 2/2 blood cultures from 12/25 on admission-not contaminant -sensitive to oxacillin, continue IV ancef, s/p 5days of Iv Vanc and 5days of Ancef -Repeat Blood Cx for  clearance, from 12/31-NGTD x2days -I called and d/w ID Dr.Snider 12/31, recommended ECHO/repeat Blood Cx and atleast 4week course of IV Ancef for this, 2d ECHO done results pending -needs PICC line prior to discharge  3. CAD, mitral valve regurgitation, chronic diastolic dysfunction with EF 60-65%.  -Compensated from a cardiac standpoint, -stopped IVF  3. ARF. Due to #1 above.  -Improved with gentle hydration -stopped IVF  4. Dyslipidemia on statin.  5.  GERD. On PPI.  DVT proph: Hep SQ  Family Communication  :  Grand daughter at bedside 12/31 Code Status :  DNR Disposition Plan  :  Wants SNF[facility] Consults  :  Surgery  Procedures  :  Angiogram   Lab Results  Component Value Date   PLT 427 (H) 01/04/2016    Inpatient Medications  Scheduled Meds: . amiodarone  100 mg Oral Daily  . amLODipine  2.5 mg Oral Daily  . aspirin EC  81 mg Oral Daily  . atorvastatin  20 mg Oral QODAY  .  ceFAZolin (ANCEF) IV  2 g Intravenous Q12H  . heparin  5,000 Units Subcutaneous Q8H  . levothyroxine  75 mcg Oral QAC breakfast  . pantoprazole  40 mg Oral Daily  . polyethylene glycol  17 g Oral BID  . vitamin B-12  1,000 mcg Oral Daily   Continuous Infusions: . sodium chloride 10 mL/hr at 01/02/16 1002   PRN Meds:.albuterol, hydrALAZINE, HYDROcodone-acetaminophen, metoprolol, ondansetron **OR** ondansetron (ZOFRAN) IV  Antibiotics  :    Anti-infectives    Start     Dose/Rate Route Frequency Ordered Stop   01/04/16 0200  ceFAZolin (ANCEF) IVPB 2g/100 mL premix     2 g 200 mL/hr over 30 Minutes Intravenous Every 12 hours 01/03/16 1502     01/02/16 1600  ceFAZolin (ANCEF) IVPB 1 g/50 mL premix  Status:  Discontinued     1 g 100 mL/hr over 30 Minutes Intravenous Every 8 hours 01/02/16 1204 01/03/16 1502   12/31/15 1230  ceFAZolin (ANCEF) IVPB 1 g/50 mL premix  Status:  Discontinued     1 g 100 mL/hr over 30 Minutes Intravenous Every 8 hours 12/31/15 1105 01/02/16 1204    12/28/15 1230  vancomycin (VANCOCIN) 500 mg in sodium chloride 0.9 % 100 mL IVPB  Status:  Discontinued     500 mg 100 mL/hr over 60 Minutes Intravenous Every 24 hours 12/27/15 1525 12/28/15 0952   12/28/15 1230  vancomycin (VANCOCIN) IVPB 750 mg/150 ml premix  Status:  Discontinued     750 mg 150 mL/hr over 60 Minutes Intravenous Every 24 hours 12/28/15 0952 12/31/15 1047   12/27/15 2030  piperacillin-tazobactam (ZOSYN) IVPB 3.375 g  Status:  Discontinued     3.375 g 12.5 mL/hr over 240 Minutes Intravenous Every 8 hours 12/27/15 1425 12/31/15 1047   12/27/15 1230  piperacillin-tazobactam (ZOSYN) IVPB 3.375 g     3.375 g 100 mL/hr over 30 Minutes Intravenous  Once 12/27/15 1217 12/27/15 1311   12/27/15 1230  vancomycin (VANCOCIN) IVPB 1000 mg/200 mL premix     1,000 mg 200 mL/hr over 60 Minutes Intravenous  Once 12/27/15 1217 12/27/15 1339         Objective:   Vitals:   01/03/16 1437 01/03/16 2150 01/04/16 0618 01/04/16 0623  BP: (!) 159/48 (!) 150/48 (!) 143/54   Pulse: 78 73 (!) 51   Resp: 18 18 16    Temp: 98.1 F (36.7 C) 98 F (36.7 C) 98.2 F (36.8 C)   TempSrc: Oral Oral Oral   SpO2: 98% 100% 99%   Weight:    54.9 kg (121 lb)  Height:        Wt Readings from Last 3 Encounters:  01/04/16 54.9 kg (121 lb)  09/21/15 48.5 kg (107 lb)  06/24/15 48.5 kg (107 lb)     Intake/Output Summary (Last 24 hours) at 01/04/16 1153 Last data filed at 01/04/16 0534  Gross per 24 hour  Intake           625.67 ml  Output               50 ml  Net           575.67 ml     Physical Exam  Awake Alert, Oriented X 3,no distress, appears much younger than stated age [place].AT,PERRAL Supple Neck,No JVD, LUNGS: , Good air movement bilaterally, CTAB CVS: RRR,No Gallops,Rubs, +ve mitral valve systolic murmur Abd: soft, +ve B.Sounds, No tenderness,  R foot great toe with gangrenous changes Rt toe/foot        Data Review:    CBC  Recent Labs Lab 01/04/16 0417  WBC 6.6    HGB 9.7*  HCT 29.8*  PLT 427*  MCV 92.5  MCH 30.1  MCHC 32.6  RDW 13.3    Chemistries   Recent Labs Lab 12/31/15 1058 01/01/16 0632 01/03/16 0344 01/03/16 2350 01/04/16 0417  NA 138 141 141 141 142  K 3.4* 3.5 3.6 3.3* 3.4*  CL 109 112* 113* 112* 113*  CO2 23 24 22  21* 22  GLUCOSE 105* 94 77 96 86  BUN 13 13 11 10 10   CREATININE 1.22* 1.24* 1.22* 1.21* 1.23*  CALCIUM 8.2* 8.2* 8.5* 8.2* 8.4*   ------------------------------------------------------------------------------------------------------------------ No results for input(s): CHOL, HDL, LDLCALC, TRIG, CHOLHDL, LDLDIRECT in the last 72 hours.  No results found for: HGBA1C ------------------------------------------------------------------------------------------------------------------ No results for input(s): TSH, T4TOTAL, T3FREE, THYROIDAB in the last 72 hours.  Invalid input(s): FREET3 ------------------------------------------------------------------------------------------------------------------ No results for input(s): VITAMINB12, FOLATE, FERRITIN, TIBC, IRON, RETICCTPCT in the last 72 hours.  Coagulation profile  Recent Labs Lab 01/04/16 0417  INR 1.18    No results for input(s): DDIMER in the last 72 hours.  Cardiac Enzymes No results for input(s): CKMB, TROPONINI, MYOGLOBIN in the last 168 hours.  Invalid input(s): CK ------------------------------------------------------------------------------------------------------------------    Component Value Date/Time   BNP 270.0 (H) 12/27/2015 1226    Micro Results Recent Results (from the past 240 hour(s))  Blood culture (routine x 2)     Status: Abnormal   Collection Time: 12/27/15 12:25 PM  Result Value Ref Range Status   Specimen Description RIGHT ANTECUBITAL  Final   Special Requests BOTTLES DRAWN AEROBIC AND ANAEROBIC [facility] EACH  Final   Culture  Setup Time   Final    GRAM POSITIVE COCCI RECOVERED FROM ANAEROBIC BOTTLE Gram Stain Report  Called to,Read Back By and Verified With: STONE,B AT 1020 ON 12/28/15 BY BAYSE,L    Culture (A)  Final    STAPHYLOCOCCUS LUGDUNENSIS SUSCEPTIBILITIES PERFORMED ON PREVIOUS CULTURE WITHIN THE LAST 5 DAYS. [facility]    Report Status 12/30/2015 FINAL  Final  Blood culture (routine x 2)     Status: Abnormal   Collection Time: 12/27/15 12:26 PM  Result Value Ref Range Status   Specimen Description LEFT ANTECUBITAL  Final   Special Requests BOTTLES DRAWN AEROBIC AND ANAEROBIC [facility]CC EACH  Final   Culture  Setup Time   Final    GRAM POSITIVE COCCI Gram Stain Report Called to,Read Back By and Verified With: GLENN,T AT 0703 ON 12/28/15 BY HUFFINES,S . CRITICAL RESULT CALLED TO, READ BACK BY AND VERIFIED WITH: K PITTMAN,PHARMD AT 1438 12/28/15 BY L BENFIELD Performed [facility]    Culture STAPHYLOCOCCUS LUGDUNENSIS (A)  Final   Report Status 12/30/2015 FINAL  Final   Organism ID, Bacteria STAPHYLOCOCCUS LUGDUNENSIS  Final      Susceptibility  Staphylococcus lugdunensis - MIC*    CIPROFLOXACIN <=0.5 SENSITIVE Sensitive     ERYTHROMYCIN <=0.25 SENSITIVE Sensitive     GENTAMICIN <=0.5 SENSITIVE Sensitive     OXACILLIN 2 SENSITIVE Sensitive     TETRACYCLINE <=1 SENSITIVE Sensitive     VANCOMYCIN <=0.5 SENSITIVE Sensitive     TRIMETH/SULFA <=10 SENSITIVE Sensitive     CLINDAMYCIN <=0.25 SENSITIVE Sensitive     RIFAMPIN <=0.5 SENSITIVE Sensitive     Inducible Clindamycin NEGATIVE Sensitive     * STAPHYLOCOCCUS LUGDUNENSIS  Blood Culture ID Panel (Reflexed)     Status: Abnormal   Collection Time: 12/27/15 12:26 PM  Result Value Ref Range Status   Enterococcus species NOT DETECTED NOT DETECTED Final   Listeria monocytogenes NOT DETECTED NOT DETECTED Final   Staphylococcus species DETECTED (A) NOT DETECTED Final    Comment: CRITICAL RESULT CALLED TO, READ BACK BY AND VERIFIED WITH: K PITTMAN,PHARMD AT 1438 12/28/15 BY L BENFIELD    Staphylococcus aureus NOT  DETECTED NOT DETECTED Final   Methicillin resistance NOT DETECTED NOT DETECTED Final   Streptococcus species NOT DETECTED NOT DETECTED Final   Streptococcus agalactiae NOT DETECTED NOT DETECTED Final   Streptococcus pneumoniae NOT DETECTED NOT DETECTED Final   Streptococcus pyogenes NOT DETECTED NOT DETECTED Final   Acinetobacter baumannii NOT DETECTED NOT DETECTED Final   Enterobacteriaceae species NOT DETECTED NOT DETECTED Final   Enterobacter cloacae complex NOT DETECTED NOT DETECTED Final   Escherichia coli NOT DETECTED NOT DETECTED Final   Klebsiella oxytoca NOT DETECTED NOT DETECTED Final   Klebsiella pneumoniae NOT DETECTED NOT DETECTED Final   Proteus species NOT DETECTED NOT DETECTED Final   Serratia marcescens NOT DETECTED NOT DETECTED Final   Haemophilus influenzae NOT DETECTED NOT DETECTED Final   Neisseria meningitidis NOT DETECTED NOT DETECTED Final   Pseudomonas aeruginosa NOT DETECTED NOT DETECTED Final   Candida albicans NOT DETECTED NOT DETECTED Final   Candida glabrata NOT DETECTED NOT DETECTED Final   Candida krusei NOT DETECTED NOT DETECTED Final   Candida parapsilosis NOT DETECTED NOT DETECTED Final   Candida tropicalis NOT DETECTED NOT DETECTED Final    Comment: Performed at [facility]  Surgical pcr screen     Status: None   Collection Time: 12/30/15  2:20 AM  Result Value Ref Range Status   MRSA, PCR NEGATIVE NEGATIVE Final   Staphylococcus aureus NEGATIVE NEGATIVE Final    Comment:        The Xpert SA Assay (FDA approved for NASAL specimens in patients over 27 years of age), is one component of a comprehensive surveillance program.  Test performance has been validated by [facility] for patients greater than or equal to 40 year old. It is not intended to diagnose infection nor to guide or monitor treatment.   Culture, blood (routine x 2)     Status: None (Preliminary result)   Collection Time: 01/02/16 11:16 AM  Result Value Ref Range  Status   Specimen Description BLOOD BLOOD RIGHT ARM  Final   Special Requests IN PEDIATRIC BOTTLE  Final   Culture NO GROWTH 1 DAY  Final   Report Status PENDING  Incomplete  Culture, blood (routine x 2)     Status: None (Preliminary result)   Collection Time: 01/02/16 11:29 AM  Result Value Ref Range Status   Specimen Description BLOOD BLOOD LEFT HAND  Final   Special Requests IN PEDIATRIC BOTTLE  Final   Culture NO GROWTH 1 DAY  Final  Report Status PENDING  Incomplete  Surgical pcr screen     Status: None   Collection Time: 01/03/16  3:55 PM  Result Value Ref Range Status   MRSA, PCR NEGATIVE NEGATIVE Final   Staphylococcus aureus NEGATIVE NEGATIVE Final    Comment:        The Xpert SA Assay (FDA approved for NASAL specimens in patients over 15 years of age), is one component of a comprehensive surveillance program.  Test performance has been validated by [facility] for patients greater than or equal to 38 year old. It is not intended to diagnose infection nor to guide or monitor treatment.     Radiology Reports [place] Reflux Bilat  Result Date: 01/01/2016 CLINICAL DATA:  Right toe gangrene. Please evaluate patency and size of right greater saphenous vein for potential venous bypass. EXAM: RIGHT LOWER EXTREMITY VENOUS MAPPING. TECHNIQUE: Gray-scale sonography with graded compression, as well as color Doppler and duplex ultrasound were performed to evaluate the lower extremity deep venous systems from the level of the common femoral vein and including the common femoral, femoral, profunda femoral, popliteal and calf veins including the posterior tibial, peroneal and gastrocnemius veins when visible. The superficial great saphenous vein was also interrogated. Spectral Doppler was utilized to evaluate flow at rest and with distal augmentation maneuvers in the common femoral, femoral and popliteal veins. COMPARISON:  None. FINDINGS: The greater saphenous vein appears  widely patent throughout its imaged course. The greater saphenous vein measures 0.5 cm proximally (image 5), approximately 0.5 cm at the level of the distal thigh (image 17), 0.4 cm at the level the proximal calf (image 21) and 0.3 cm at the level of the distal calf (image 29). The right common femoral vein  appears widely patent where imaged. IMPRESSION: Wide patency of the right greater saphenous vein with measurements as above. Electronically Signed   By: Simonne Come M.D.   On: 01/01/2016 07:56   US Arterial Seg Single  Result Date: 12/28/2015 CLINICAL DATA:  Nonhealing right great toe wound, gangrene, osteomyelitis EXAM: NONINVASIVE PHYSIOLOGIC VASCULAR STUDY OF BILATERAL LOWER EXTREMITIES TECHNIQUE: Evaluation of both lower extremities were performed at rest, including calculation of ankle-brachial indices with single level Doppler, pressure and pulse volume recording. COMPARISON:  None available FINDINGS: Right ABI:  0.58 Left ABI:  0.86 Right Lower Extremity: Irregular monophasic right posterior tibial waveform. Biphasic right dorsalis pedis waveform. Markedly decreased right ABI measuring 0.58 indicative of significant or moderate range right lower extremity peripheral vascular disease. Left Lower Extremity: Biphasic left posterior tibial and dorsalis pedis waveforms. Mildly decreased ABI measuring 0.86. Suspect nonocclusive left lower extremity vascular disease. IMPRESSION: Right ABI 0.58 indicative of moderate range right lower extremity peripheral vascular disease. Minor decrease in the left ABI as above. Electronically Signed   By: Judie Petit.  Shick M.D.   On: 12/28/2015 12:21   [place] Lower Ext Art Right  Result Date: 12/28/2015 CLINICAL DATA:  Right great toe gangrene and osteomyelitis. EXAM: UNILATERAL RIGHT LOWER EXTREMITY ARTERIAL DUPLEX SCAN TECHNIQUE: Gray-scale sonography as well as color Doppler and duplex ultrasound was performed to evaluate the arteries of the lower extremity. COMPARISON:  ABI  examination 12/28/2015 FINDINGS: Atherosclerotic calcifications in the right common femoral artery with a monophasic waveform. Deep femoral artery is patent with monophasic waveform. Calcifications and plaque in the right SFA. Monophasic waveforms throughout the right SFA. Peak systolic velocity in the proximal right SFA is 133 cm/sec and the peak systolic velocity in the mid SFA is  221 cm/sec. Atherosclerotic calcifications in the right popliteal artery with monophasic waveform. Monophasic waveform in the right peroneal artery. Monophasic waveform in the right posterior tibial artery. The distal right posterior tibial artery may be occluded. Monophasic waveform in the anterior tibial artery. Markedly elevated peak systolic velocity in the proximal anterior tibial artery measuring up to 496 cm/sec, this is compatible with stenosis. IMPRESSION: Diffuse atherosclerotic disease throughout the right lower extremity arteries. Right runoff disease demonstrated by stenosis in the right anterior tibial artery and occlusion or minimal flow in the distal posterior tibial artery. At least mild stenosis in the mid right SFA. Monophasic waveforms throughout the right lower extremity may signify underlying inflow disease. Electronically Signed   By: Richarda Overlie M.D.   On: 12/28/2015 14:41   Dg Chest Port 1 View  Result Date: 12/27/2015 CLINICAL DATA:  Cough.  Right great toe infection. EXAM: PORTABLE CHEST 1 VIEW COMPARISON:  PA and lateral chest 05/14/2009. Single-view of the chest 05/10/2009 and 04/25/2007. FINDINGS: There is cardiomegaly and vascular congestion. No consolidative process, pneumothorax or effusion. IMPRESSION: Cardiomegaly and pulmonary vascular congestion. Electronically Signed   By: Drusilla Kanner M.D.   On: 12/27/2015 14:48   Dg Foot Complete Right  Result Date: 12/27/2015 CLINICAL DATA:  The patient tried her great toenail her of this week with onset of swelling, wound in bowel odor. The toe is  discolored. Initial encounter. EXAM: RIGHT FOOT COMPLETE - 3+ VIEW COMPARISON:  None. FINDINGS: Soft tissues of the great toe are markedly swollen. There is bony destructive change throughout the distal phalanx a comminuted fracture that is transverse in orientation through the metaphysis and extends the articular surface. Bones are osteopenic. No other acute bony abnormality is seen. IMPRESSION: Findings most consistent with osteomyelitis and pathologic fracture of the distal phalanx of the right great toe. Electronically Signed   By: Drusilla Kanner M.D.   On: 12/27/2015 13:17    Time Spent in minutes  25 minutes   Amarien Carne M.D on 01/04/2016 at 11:53 AM  Between 7am to 7pm - Pager - [phone]  After 7pm go to [email or web address] - password [facility]  [facility] -  Office  [phone]

## 2016-01-05 ENCOUNTER — Inpatient Hospital Stay (HOSPITAL_COMMUNITY): Payer: Medicare Other | Admitting: Certified Registered Nurse Anesthetist

## 2016-01-05 ENCOUNTER — Encounter (HOSPITAL_COMMUNITY)
Admission: EM | Disposition: A | Discharge status: Home Health | Payer: Self-pay | Source: Home / Self Care | Attending: Internal Medicine

## 2016-01-05 ENCOUNTER — Encounter (HOSPITAL_COMMUNITY): Payer: Self-pay | Admitting: Surgery

## 2016-01-05 DIAGNOSIS — R7881 Bacteremia: Secondary | ICD-10-CM

## 2016-01-05 DIAGNOSIS — I998 Other disorder of circulatory system: Secondary | ICD-10-CM

## 2016-01-05 DIAGNOSIS — I10 Essential (primary) hypertension: Secondary | ICD-10-CM

## 2016-01-05 HISTORY — PX: AMPUTATION: SHX166

## 2016-01-05 LAB — BASIC METABOLIC PANEL
Anion gap: 8 (ref 5–15)
BUN: 11 mg/dL (ref 6–20)
CALCIUM: 8.4 mg/dL — AB (ref 8.9–10.3)
CO2: 24 mmol/L (ref 22–32)
Chloride: 111 mmol/L (ref 101–111)
Creatinine, Ser: 1.17 mg/dL — ABNORMAL HIGH (ref 0.44–1.00)
GFR calc Af Amer: 45 mL/min — ABNORMAL LOW (ref >60)
GFR, EST NON AFRICAN AMERICAN: 39 mL/min — AB (ref >60)
GLUCOSE: 123 mg/dL — AB (ref 65–99)
POTASSIUM: 3.6 mmol/L (ref 3.5–5.1)
SODIUM: 143 mmol/L (ref 135–145)

## 2016-01-05 LAB — CBC
HEMATOCRIT: 31.6 % — AB (ref 36.0–46.0)
Hemoglobin: 10.3 g/dL — ABNORMAL LOW (ref 12.0–15.0)
MCH: 30.6 pg (ref 26.0–34.0)
MCHC: 32.6 g/dL (ref 30.0–36.0)
MCV: 93.8 fL (ref 78.0–100.0)
PLATELETS: 392 10*3/uL (ref 150–400)
RBC: 3.37 MIL/uL — ABNORMAL LOW (ref 3.87–5.11)
RDW: 13.7 % (ref 11.5–15.5)
WBC: 7.3 10*3/uL (ref 4.0–10.5)

## 2016-01-05 LAB — PROTIME-INR
INR: 1.07
PROTHROMBIN TIME: 14 s (ref 11.4–15.2)

## 2016-01-05 SURGERY — AMPUTATION DIGIT
Anesthesia: Monitor Anesthesia Care | Site: Foot | Laterality: Right

## 2016-01-05 MED ORDER — EPHEDRINE SULFATE 50 MG/ML IJ SOLN
INTRAMUSCULAR | Status: DC | PRN
  Administered 2016-01-05: 15 mg via INTRAVENOUS
  Administered 2016-01-05: 10 mg via INTRAVENOUS

## 2016-01-05 MED ORDER — PROPOFOL 10 MG/ML IV BOLUS
INTRAVENOUS | Status: AC
  Filled 2016-01-05: qty 20

## 2016-01-05 MED ORDER — 0.9 % SODIUM CHLORIDE (POUR BTL) OPTIME
TOPICAL | Status: DC | PRN
  Administered 2016-01-05: 1000 mL

## 2016-01-05 MED ORDER — PROPOFOL 10 MG/ML IV BOLUS
INTRAVENOUS | Status: DC | PRN
  Administered 2016-01-05: 20 mg via INTRAVENOUS

## 2016-01-05 MED ORDER — PROPOFOL 500 MG/50ML IV EMUL
INTRAVENOUS | Status: DC | PRN
  Administered 2016-01-05: 80 ug/kg/min via INTRAVENOUS

## 2016-01-05 MED ORDER — LIDOCAINE HCL (PF) 1 % IJ SOLN
INTRAMUSCULAR | Status: AC
  Filled 2016-01-05: qty 30

## 2016-01-05 MED ORDER — LIDOCAINE HCL (CARDIAC) 20 MG/ML IV SOLN
INTRAVENOUS | Status: DC | PRN
  Administered 2016-01-05: 60 mg via INTRATRACHEAL

## 2016-01-05 MED ORDER — HEMOSTATIC AGENTS (NO CHARGE) OPTIME
TOPICAL | Status: DC | PRN
  Administered 2016-01-05: 1 via TOPICAL

## 2016-01-05 MED ORDER — PROTAMINE SULFATE 10 MG/ML IV SOLN
INTRAVENOUS | Status: AC
  Filled 2016-01-05: qty 5

## 2016-01-05 MED ORDER — HEPARIN SODIUM (PORCINE) 1000 UNIT/ML IJ SOLN
INTRAMUSCULAR | Status: AC
  Filled 2016-01-05: qty 2

## 2016-01-05 MED ORDER — LIDOCAINE 2% (20 MG/ML) 5 ML SYRINGE
INTRAMUSCULAR | Status: AC
  Filled 2016-01-05: qty 10

## 2016-01-05 MED ORDER — BACITRACIN ZINC 500 UNIT/GM EX OINT
TOPICAL_OINTMENT | CUTANEOUS | Status: AC
  Filled 2016-01-05: qty 28.35

## 2016-01-05 MED ORDER — EPHEDRINE 5 MG/ML INJ
INTRAVENOUS | Status: AC
  Filled 2016-01-05: qty 20

## 2016-01-05 MED ORDER — DEXTROSE 5 % IV SOLN
1.5000 g | INTRAVENOUS | Status: AC
  Filled 2016-01-05: qty 1.5

## 2016-01-05 MED ORDER — CEFUROXIME SODIUM 750 MG IJ SOLR
INTRAMUSCULAR | Status: AC
  Filled 2016-01-05: qty 750

## 2016-01-05 MED ORDER — FENTANYL CITRATE (PF) 100 MCG/2ML IJ SOLN
INTRAMUSCULAR | Status: AC
  Filled 2016-01-05: qty 2

## 2016-01-05 SURGICAL SUPPLY — 35 items
BANDAGE ACE 4X5 VEL STRL LF (GAUZE/BANDAGES/DRESSINGS) IMPLANT
BANDAGE ELASTIC 4 VELCRO ST LF (GAUZE/BANDAGES/DRESSINGS) ×2 IMPLANT
BLADE AVERAGE 25X9 (BLADE) ×2 IMPLANT
BLADE SAW SGTL 81X20 HD (BLADE) IMPLANT
BNDG CONFORM 3 STRL LF (GAUZE/BANDAGES/DRESSINGS) IMPLANT
BNDG GAUZE ELAST 4 BULKY (GAUZE/BANDAGES/DRESSINGS) ×2 IMPLANT
CANISTER SUCTION 2500CC (MISCELLANEOUS) ×2 IMPLANT
COVER SURGICAL LIGHT HANDLE (MISCELLANEOUS) ×2 IMPLANT
DRAPE HALF SHEET 40X57 (DRAPES) ×2 IMPLANT
ELECT REM PT RETURN 9FT ADLT (ELECTROSURGICAL) ×2
ELECTRODE REM PT RTRN 9FT ADLT (ELECTROSURGICAL) ×1 IMPLANT
GAUZE SPONGE 4X4 12PLY STRL (GAUZE/BANDAGES/DRESSINGS) IMPLANT
GLOVE BIOGEL PI IND STRL 7.5 (GLOVE) ×1 IMPLANT
GLOVE BIOGEL PI INDICATOR 7.5 (GLOVE) ×1
GLOVE SURG SS PI 7.5 STRL IVOR (GLOVE) ×2 IMPLANT
GOWN STRL REUS W/ TWL LRG LVL3 (GOWN DISPOSABLE) ×1 IMPLANT
GOWN STRL REUS W/ TWL XL LVL3 (GOWN DISPOSABLE) ×1 IMPLANT
GOWN STRL REUS W/TWL LRG LVL3 (GOWN DISPOSABLE) ×1
GOWN STRL REUS W/TWL XL LVL3 (GOWN DISPOSABLE) ×1
HEMOSTAT SNOW SURGICEL 2X4 (HEMOSTASIS) ×2 IMPLANT
KIT BASIN OR (CUSTOM PROCEDURE TRAY) ×2 IMPLANT
KIT ROOM TURNOVER OR (KITS) ×2 IMPLANT
NEEDLE HYPO 25GX1X1/2 BEV (NEEDLE) IMPLANT
NS IRRIG 1000ML POUR BTL (IV SOLUTION) ×2 IMPLANT
PACK GENERAL/GYN (CUSTOM PROCEDURE TRAY) ×2 IMPLANT
PAD ARMBOARD 7.5X6 YLW CONV (MISCELLANEOUS) ×4 IMPLANT
SPECIMEN JAR SMALL (MISCELLANEOUS) ×2 IMPLANT
SPONGE GAUZE 4X4 12PLY STER LF (GAUZE/BANDAGES/DRESSINGS) ×2 IMPLANT
SUT ETHILON 3 0 PS 1 (SUTURE) ×4 IMPLANT
SYR CONTROL 10ML LL (SYRINGE) IMPLANT
TOWEL OR 17X24 6PK STRL BLUE (TOWEL DISPOSABLE) ×2 IMPLANT
TOWEL OR 17X26 10 PK STRL BLUE (TOWEL DISPOSABLE) ×2 IMPLANT
TUBE ANAEROBIC SPECIMEN COL (MISCELLANEOUS) IMPLANT
UNDERPAD 30X30 (UNDERPADS AND DIAPERS) ×2 IMPLANT
WATER STERILE IRR 1000ML POUR (IV SOLUTION) ×2 IMPLANT

## 2016-01-05 NOTE — Progress Notes (Signed)
[facility] [place]  Subjective  - Doing well over all.    Objective (!) 132/34 (!) 57 97.8 F (36.6 C) (Oral) 20 98%  Intake/Output Summary (Last 24 hours) at 01/05/16 16100812 Last data filed at 01/05/16 0430  Gross per 24 hour  Intake              240 ml  Output             1250 ml  Net            -1010 ml    Left doppler signals PT/DP/peroneal Right groin soft without hematoma Left great toe dry gangrene Heart RRR Lungs non labored breathing  Assessment/Planning: POD # 1 Angiogram Impression:             #1  high-grade stenosis within the tibioperoneal trunk and proximal peroneal artery successfully treated with atherectomy using a CSI 1.25 micro device and a 3 mm coyote balloon  Plan OR today for right great toe amputation MPO Will consult social work for SNF placement later this week  [name] GallantCOLLINS, Aideliz [facility] 01/05/2016 8:12 AM --  Laboratory Lab Results:  Recent Labs  01/04/16 0417 01/05/16 0223  WBC 6.6 7.3  HGB 9.7* 10.3*  HCT 29.8* 31.6*  PLT 427* 392   BMET  Recent Labs  01/04/16 0417 01/05/16 0223  NA 142 143  K 3.4* 3.6  CL 113* 111  CO2 22 24  GLUCOSE 86 123*  BUN 10 11  CREATININE 1.23* 1.17*  CALCIUM 8.4* 8.4*    COAG Lab Results  Component Value Date   INR 1.07 01/05/2016   INR 1.18 01/04/2016   INR 1.15 12/27/2015   No results found for: PTT

## 2016-01-05 NOTE — Progress Notes (Signed)
PT Cancellation Note  Patient Details Name: [name] MRN: [id number] DOB: 07/20/1920   Cancelled Treatment:    Reason Eval/Treat Not Completed: Patient at procedure or test/unavailable (pt to have toe amputation today)   Tamala SerUhlenberg, Kari Kerth Kistler 01/05/2016, 11:43 AM [phone]

## 2016-01-05 NOTE — Anesthesia Procedure Notes (Signed)
Procedure Name: MAC Date/Time: 01/05/2016 3:54 PM Performed by: Salli Quarry Sophie Quiles Pre-anesthesia Checklist: Patient identified, Emergency Drugs available, Suction available and Patient being monitored Patient Re-evaluated:Patient Re-evaluated prior to inductionOxygen Delivery Method: Nasal cannula

## 2016-01-05 NOTE — Anesthesia Procedure Notes (Signed)
Anesthesia Regional Block:  Ankle block  Pre-Anesthetic Checklist: ,, timeout performed, Correct Patient, Correct Site, Correct Laterality, Correct Procedure, Correct Position, site marked, Risks and benefits discussed,  Surgical consent,  Pre-op evaluation,  At surgeon's request and post-op pain management Needles:       Needle Gauge: 25 and 25 G    Additional Needles: Ankle block Narrative:  Start time: 01/05/2016 3:20 PM End time: 01/05/2016 3:35 PM Injection made incrementally with aspirations every 5 mL.  Performed by: Personally  Anesthesiologist: Dorris SinghGREEN, Khian Remo

## 2016-01-05 NOTE — Progress Notes (Signed)
PROGRESS NOTE        PATIENT DETAILS Name: CARTIER MAPEL Age: 83 y.o. Sex: female Date of Birth: [birth year] Admit Date: 12/27/2015 Admitting Physician Leroy Sea, MD ZOX:[id number], Chancy Hurter, MD  Brief Narrative: Patient is a 83 y.o. female with history of CAD, chronic diastolic heart failure, paroxysmal atrial fibrillation not on anticoagulation who presented with right toe gangrene. She was found to have Staph lugdenesis bacteremia on 12/25. She was empirically started on IV antibiotics, vascular surgery was consulted and underwent angioplasty of the right peroneal artery and right tibioperoneal trunk on 1/2. Plans are for right great toe amputation on 1/3. See below for further details  Subjective: Lying comfortably in bed. Family at bedside-anxious regarding toe amputation.  Assessment/Plan: Peripheral vascular disease with right big toe gangrene: Seen by VATS-underwent angioplasty of the right peroneal artery and right tibioperoneal trunk on 1/2.Plans are for right great toe amputation on 1/3  Staph lugdenensis bacteremia: Likely secondary to above, blood cultures on 12/25 positive for staph lugdenensis. Repeat Blood cultures on 12/31 negative. Dr. Jomarie Longs spoke with Dr. Drue Second on 12/31-and recommendations were to repeat echo, and plan for at least 4 weeks of IV Ancef.  TTE with no obvious vegetation-doubt any benefit pursuing TEE on this 83 year old patient will change management outcome.   CAD: No chest pain or shortness of breath. Seen by cardiology preoperatively-no further workup required prior to proceeding with amputation. Continue aspirin and statin.   Mild to moderate pericardial effusion: Seems to be a chronic issue-has seen on prior echo on 03/23/68. No tamponade pathophysiology.   Paroxysmal atrial fibrillation: Continue amiodarone, not on anticoagulation likely secondary to advanced age and risk of falls.   Chronic diastolic dysfunction (EF  60-65% on TTE 01/04/16): Clinically compensated  Hypothyroidism: Continue levothyroxine  Dyslipidemia: Continue statin  GERD: Continue PPI  DVT Prophylaxis: Prophylactic Heparin   Code Status:  DNR  Family Communication: Numerous falls at bedside  Disposition Plan: Remain inpatient-likely SNF on discharge on 1/5  Antimicrobial agents: Anti-infectives    Start     Dose/Rate Route Frequency Ordered Stop   01/05/16 1448  cefUROXime (ZINACEF) 1.5 g in dextrose 5 % 50 mL IVPB     1.5 g 100 mL/hr over 30 Minutes Intravenous To ShortStay Surgical 01/05/16 0821 01/06/16 1500   01/05/16 0600  ceFAZolin (ANCEF) IVPB 1 g/50 mL premix  Status:  Discontinued    Comments:  Send with pt to OR   1 g 100 mL/hr over 30 Minutes Intravenous To Short Stay 01/04/16 1916 01/05/16 0831   01/04/16 0200  ceFAZolin (ANCEF) IVPB 2g/100 mL premix     2 g 200 mL/hr over 30 Minutes Intravenous Every 12 hours 01/03/16 1502     01/02/16 1600  ceFAZolin (ANCEF) IVPB 1 g/50 mL premix  Status:  Discontinued     1 g 100 mL/hr over 30 Minutes Intravenous Every 8 hours 01/02/16 1204 01/03/16 1502   12/31/15 1230  ceFAZolin (ANCEF) IVPB 1 g/50 mL premix  Status:  Discontinued     1 g 100 mL/hr over 30 Minutes Intravenous Every 8 hours 12/31/15 1105 01/02/16 1204   12/28/15 1230  vancomycin (VANCOCIN) 500 mg in sodium chloride 0.9 % 100 mL IVPB  Status:  Discontinued     500 mg 100 mL/hr over 60 Minutes Intravenous Every 24 hours 12/27/15 1525 12/28/15  [id number]   12/28/15 1230  vancomycin (VANCOCIN) IVPB 750 mg/150 ml premix  Status:  Discontinued     750 mg 150 mL/hr over 60 Minutes Intravenous Every 24 hours 12/28/15 0952 12/31/15 1047   12/27/15 2030  piperacillin-tazobactam (ZOSYN) IVPB 3.375 g  Status:  Discontinued     3.375 g 12.5 mL/hr over 240 Minutes Intravenous Every 8 hours 12/27/15 1425 12/31/15 1047   12/27/15 1230  piperacillin-tazobactam (ZOSYN) IVPB 3.375 g     3.375 g 100 mL/hr over 30 Minutes  Intravenous  Once 12/27/15 1217 12/27/15 1311   12/27/15 1230  vancomycin (VANCOCIN) IVPB 1000 mg/200 mL premix     1,000 mg 200 mL/hr over 60 Minutes Intravenous  Once 12/27/15 1217 12/27/15 1339      Procedures: Echo 1/2>>Normal LV size with moderate LV hypertrophy. EF 60-65%. Moderate diastolic dysfunction with evidence for elevated LV filling  pressure. Normal RV size and systolic function. Aortic sclerosis without significant stenosis. Mild to moderate pericardial effusion without tamponade.  CONSULTS:  cardiology and vascular surgery  Time spent: 25- minutes-Greater than 50% of this time was spent in counseling, explanation of diagnosis, planning of further management, and coordination of care.  MEDICATIONS: Scheduled Meds: . amiodarone  100 mg Oral Daily  . amLODipine  2.5 mg Oral Daily  . aspirin EC  81 mg Oral Daily  . atorvastatin  20 mg Oral QODAY  .  ceFAZolin (ANCEF) IV  2 g Intravenous Q12H  . cefUROXime (ZINACEF)  IV  1.5 g Intravenous To SS-Surg  . docusate sodium  100 mg Oral Daily  . heparin  5,000 Units Subcutaneous Q8H  . levothyroxine  75 mcg Oral QAC breakfast  . pantoprazole  40 mg Oral Daily  . polyethylene glycol  17 g Oral BID  . vitamin B-12  1,000 mcg Oral Daily   Continuous Infusions: . sodium chloride 75 mL/hr at 01/05/16 1200   PRN Meds:.acetaminophen **OR** acetaminophen, albuterol, alum & mag hydroxide-simeth, guaiFENesin-dextromethorphan, hydrALAZINE, HYDROcodone-acetaminophen, metoprolol, ondansetron **OR** ondansetron (ZOFRAN) IV, phenol   PHYSICAL EXAM: Vital signs: Vitals:   01/05/16 0200 01/05/16 0300 01/05/16 0810 01/05/16 1141  BP: (!) 129/31 (!) 132/34 (!) 151/45 (!) 170/41  Pulse: (!) 51 (!) 57 63 65  Resp: 17 20 (!) 21 17  Temp:  97.8 F (36.6 C) 97.7 F (36.5 C) 97.7 F (36.5 C)  TempSrc:  Oral Oral Oral  SpO2: 97% 98% 98% 98%  Weight:  54.8 kg (120 lb 13 oz)    Height:       Filed Weights   01/03/16 0614 01/04/16 0623  01/05/16 0300  Weight: 53.4 kg (117 lb 13.4 oz) 54.9 kg (121 lb) 54.8 kg (120 lb 13 oz)   Body mass index is 20.1 kg/m.   General appearance :Awake, alert, not in any distress. Speech Clear. Eyes:, pupils equally reactive to light and accomodation HEENT: Atraumatic and Normocephalic Neck: supple, no JVD. No cervical lymphadenopathy. No thyromegaly Resp:Good air entry bilaterally, no added sounds  CVS: S1 S2 regular, no murmurs.  GI: Bowel sounds present, Non tender and not distended with no gaurding, rigidity or rebound.No organomegaly Extremities: B/L Lower Ext shows no edema. Right great toe gangrene Neurology:  speech clear,Non focal, sensation is grossly intact. Musculoskeletal:No digital cyanosis Skin:No Rash, warm and dry Wounds:N/A  I have personally reviewed following labs and imaging studies  LABORATORY DATA: CBC:  Recent Labs Lab 01/04/16 0417 01/05/16 0223  WBC 6.6 7.3  HGB 9.7* 10.3*  HCT 29.8*  31.6*  MCV 92.5 93.8  PLT 427* 392    Basic Metabolic Panel:  Recent Labs Lab 01/01/16 0632 01/03/16 0344 01/03/16 2350 01/04/16 0417 01/05/16 0223  NA 141 141 141 142 143  K 3.5 3.6 3.3* 3.4* 3.6  CL 112* 113* 112* 113* 111  CO2 24 22 21* 22 24  GLUCOSE 94 77 96 86 123*  BUN 13 11 10 10 11   CREATININE 1.24* 1.22* 1.21* 1.23* 1.17*  CALCIUM 8.2* 8.5* 8.2* 8.4* 8.4*    GFR: Estimated Creatinine Clearance: 26 mL/min (by C-G formula based on SCr of 1.17 mg/dL (H)).  Liver Function Tests: No results for input(s): AST, ALT, ALKPHOS, BILITOT, PROT, ALBUMIN in the last 168 hours. No results for input(s): LIPASE, AMYLASE in the last 168 hours. No results for input(s): AMMONIA in the last 168 hours.  Coagulation Profile:  Recent Labs Lab 01/04/16 0417 01/05/16 0223  INR 1.18 1.07    Cardiac Enzymes: No results for input(s): CKTOTAL, CKMB, CKMBINDEX, TROPONINI in the last 168 hours.  BNP (last 3 results) No results for input(s): PROBNP in the last  8760 hours.  HbA1C: No results for input(s): HGBA1C in the last 72 hours.  CBG: No results for input(s): GLUCAP in the last 168 hours.  Lipid Profile: No results for input(s): CHOL, HDL, LDLCALC, TRIG, CHOLHDL, LDLDIRECT in the last 72 hours.  Thyroid Function Tests: No results for input(s): TSH, T4TOTAL, FREET4, T3FREE, THYROIDAB in the last 72 hours.  Anemia Panel: No results for input(s): VITAMINB12, FOLATE, FERRITIN, TIBC, IRON, RETICCTPCT in the last 72 hours.  Urine analysis: No results found for: COLORURINE, APPEARANCEUR, LABSPEC, PHURINE, GLUCOSEU, HGBUR, BILIRUBINUR, KETONESUR, PROTEINUR, UROBILINOGEN, NITRITE, LEUKOCYTESUR  Sepsis Labs: Lactic Acid, Venous    Component Value Date/Time   LATICACIDVEN 1.3 12/27/2015 1428    MICROBIOLOGY: Recent Results (from the past 240 hour(s))  Blood culture (routine x 2)     Status: Abnormal   Collection Time: 12/27/15 12:25 PM  Result Value Ref Range Status   Specimen Description RIGHT ANTECUBITAL  Final   Special Requests BOTTLES DRAWN AEROBIC AND ANAEROBIC [facility] EACH  Final   Culture  Setup Time   Final    GRAM POSITIVE COCCI RECOVERED FROM ANAEROBIC BOTTLE Gram Stain Report Called to,Read Back By and Verified With: STONE,B AT 1020 ON 12/28/15 BY BAYSE,L    Culture (A)  Final    STAPHYLOCOCCUS LUGDUNENSIS SUSCEPTIBILITIES PERFORMED ON PREVIOUS CULTURE WITHIN THE LAST 5 DAYS. Performed at [facility]    Report Status 12/30/2015 FINAL  Final  Blood culture (routine x 2)     Status: Abnormal   Collection Time: 12/27/15 12:26 PM  Result Value Ref Range Status   Specimen Description LEFT ANTECUBITAL  Final   Special Requests BOTTLES DRAWN AEROBIC AND ANAEROBIC [facility] EACH  Final   Culture  Setup Time   Final    GRAM POSITIVE COCCI Gram Stain Report Called to,Read Back By and Verified With: GLENN,T AT 0703 ON 12/28/15 BY HUFFINES,S . CRITICAL RESULT CALLED TO, READ BACK BY AND VERIFIED WITH: K PITTMAN,PHARMD AT 1438  12/28/15 BY L BENFIELD Performed at [facility]    Culture STAPHYLOCOCCUS LUGDUNENSIS (A)  Final   Report Status 12/30/2015 FINAL  Final   Organism ID, Bacteria STAPHYLOCOCCUS LUGDUNENSIS  Final      Susceptibility   Staphylococcus lugdunensis - MIC*    CIPROFLOXACIN <=0.5 SENSITIVE Sensitive     ERYTHROMYCIN <=0.25 SENSITIVE Sensitive     GENTAMICIN <=0.5 SENSITIVE  Sensitive     OXACILLIN 2 SENSITIVE Sensitive     TETRACYCLINE <=1 SENSITIVE Sensitive     VANCOMYCIN <=0.5 SENSITIVE Sensitive     TRIMETH/SULFA <=10 SENSITIVE Sensitive     CLINDAMYCIN <=0.25 SENSITIVE Sensitive     RIFAMPIN <=0.5 SENSITIVE Sensitive     Inducible Clindamycin NEGATIVE Sensitive     * STAPHYLOCOCCUS LUGDUNENSIS  Blood Culture ID Panel (Reflexed)     Status: Abnormal   Collection Time: 12/27/15 12:26 PM  Result Value Ref Range Status   Enterococcus species NOT DETECTED NOT DETECTED Final   Listeria monocytogenes NOT DETECTED NOT DETECTED Final   Staphylococcus species DETECTED (A) NOT DETECTED Final    Comment: CRITICAL RESULT CALLED TO, READ BACK BY AND VERIFIED WITH: K PITTMAN,PHARMD AT 1438 12/28/15 BY L BENFIELD    Staphylococcus aureus NOT DETECTED NOT DETECTED Final   Methicillin resistance NOT DETECTED NOT DETECTED Final   Streptococcus species NOT DETECTED NOT DETECTED Final   Streptococcus agalactiae NOT DETECTED NOT DETECTED Final   Streptococcus pneumoniae NOT DETECTED NOT DETECTED Final   Streptococcus pyogenes NOT DETECTED NOT DETECTED Final   Acinetobacter baumannii NOT DETECTED NOT DETECTED Final   Enterobacteriaceae species NOT DETECTED NOT DETECTED Final   Enterobacter cloacae complex NOT DETECTED NOT DETECTED Final   Escherichia coli NOT DETECTED NOT DETECTED Final   Klebsiella oxytoca NOT DETECTED NOT DETECTED Final   Klebsiella pneumoniae NOT DETECTED NOT DETECTED Final   Proteus species NOT DETECTED NOT DETECTED Final   Serratia marcescens NOT DETECTED NOT DETECTED  Final   Haemophilus influenzae NOT DETECTED NOT DETECTED Final   Neisseria meningitidis NOT DETECTED NOT DETECTED Final   Pseudomonas aeruginosa NOT DETECTED NOT DETECTED Final   Candida albicans NOT DETECTED NOT DETECTED Final   Candida glabrata NOT DETECTED NOT DETECTED Final   Candida krusei NOT DETECTED NOT DETECTED Final   Candida parapsilosis NOT DETECTED NOT DETECTED Final   Candida tropicalis NOT DETECTED NOT DETECTED Final    Comment: Performed at [facility]  Surgical pcr screen     Status: None   Collection Time: 12/30/15  2:20 AM  Result Value Ref Range Status   MRSA, PCR NEGATIVE NEGATIVE Final   Staphylococcus aureus NEGATIVE NEGATIVE Final    Comment:        The Xpert SA Assay (FDA approved for NASAL specimens in patients over 79 years of age), is one component of a comprehensive surveillance program.  Test performance has been validated by [facility] for patients greater than or equal to 65 year old. It is not intended to diagnose infection nor to guide or monitor treatment.   Culture, blood (routine x 2)     Status: None (Preliminary result)   Collection Time: 01/02/16 11:16 AM  Result Value Ref Range Status   Specimen Description BLOOD BLOOD RIGHT ARM  Final   Special Requests IN PEDIATRIC BOTTLE  Final   Culture NO GROWTH 2 DAYS  Final   Report Status PENDING  Incomplete  Culture, blood (routine x 2)     Status: None (Preliminary result)   Collection Time: 01/02/16 11:29 AM  Result Value Ref Range Status   Specimen Description BLOOD BLOOD LEFT HAND  Final   Special Requests IN PEDIATRIC BOTTLE  Final   Culture NO GROWTH 2 DAYS  Final   Report Status PENDING  Incomplete  Surgical pcr screen     Status: None   Collection Time: 01/03/16  3:55 PM  Result  Value Ref Range Status   MRSA, PCR NEGATIVE NEGATIVE Final   Staphylococcus aureus NEGATIVE NEGATIVE Final    Comment:        The Xpert SA Assay (FDA approved for NASAL specimens in  patients over 66 years of age), is one component of a comprehensive surveillance program.  Test performance has been validated by [facility] for patients greater than or equal to 48 year old. It is not intended to diagnose infection nor to guide or monitor treatment.     RADIOLOGY STUDIES/RESULTS: US Arterial Seg Single  Result Date: 12/28/2015 CLINICAL DATA:  Nonhealing right great toe wound, gangrene, osteomyelitis EXAM: NONINVASIVE PHYSIOLOGIC VASCULAR STUDY OF BILATERAL LOWER EXTREMITIES TECHNIQUE: Evaluation of both lower extremities were performed at rest, including calculation of ankle-brachial indices with single level Doppler, pressure and pulse volume recording. COMPARISON:  None available FINDINGS: Right ABI:  0.58 Left ABI:  0.86 Right Lower Extremity: Irregular monophasic right posterior tibial waveform. Biphasic right dorsalis pedis waveform. Markedly decreased right ABI measuring 0.58 indicative of significant or moderate range right lower extremity peripheral vascular disease. Left Lower Extremity: Biphasic left posterior tibial and dorsalis pedis waveforms. Mildly decreased ABI measuring 0.86. Suspect nonocclusive left lower extremity vascular disease. IMPRESSION: Right ABI 0.58 indicative of moderate range right lower extremity peripheral vascular disease. Minor decrease in the left ABI as above. Electronically Signed   By: Judie Petit.  Shick M.D.   On: 12/28/2015 12:21   [place] Lower Ext Art Right  Result Date: 12/28/2015 CLINICAL DATA:  Right great toe gangrene and osteomyelitis. EXAM: UNILATERAL RIGHT LOWER EXTREMITY ARTERIAL DUPLEX SCAN TECHNIQUE: Gray-scale sonography as well as color Doppler and duplex ultrasound was performed to evaluate the arteries of the lower extremity. COMPARISON:  ABI examination 12/28/2015 FINDINGS: Atherosclerotic calcifications in the right common femoral artery with a monophasic waveform. Deep femoral artery is patent with monophasic waveform.  Calcifications and plaque in the right SFA. Monophasic waveforms throughout the right SFA. Peak systolic velocity in the proximal right SFA is 133 cm/sec and the peak systolic velocity in the mid SFA is 221 cm/sec. Atherosclerotic calcifications in the right popliteal artery with monophasic waveform. Monophasic waveform in the right peroneal artery. Monophasic waveform in the right posterior tibial artery. The distal right posterior tibial artery may be occluded. Monophasic waveform in the anterior tibial artery. Markedly elevated peak systolic velocity in the proximal anterior tibial artery measuring up to 496 cm/sec, this is compatible with stenosis. IMPRESSION: Diffuse atherosclerotic disease throughout the right lower extremity arteries. Right runoff disease demonstrated by stenosis in the right anterior tibial artery and occlusion or minimal flow in the distal posterior tibial artery. At least mild stenosis in the mid right SFA. Monophasic waveforms throughout the right lower extremity may signify underlying inflow disease. Electronically Signed   By: Richarda Overlie M.D.   On: 12/28/2015 14:41   Dg Chest Port 1 View  Result Date: 12/27/2015 CLINICAL DATA:  Cough.  Right great toe infection. EXAM: PORTABLE CHEST 1 VIEW COMPARISON:  PA and lateral chest 05/14/2009. Single-view of the chest 05/10/2009 and 04/25/2007. FINDINGS: There is cardiomegaly and vascular congestion. No consolidative process, pneumothorax or effusion. IMPRESSION: Cardiomegaly and pulmonary vascular congestion. Electronically Signed   By: Drusilla Kanner M.D.   On: 12/27/2015 14:48   Dg Foot Complete Right  Result Date: 12/27/2015 CLINICAL DATA:  The patient tried her great toenail her of this week with onset of swelling, wound in bowel odor. The toe is discolored. Initial encounter. EXAM: RIGHT  FOOT COMPLETE - 3+ VIEW COMPARISON:  None. FINDINGS: Soft tissues of the great toe are markedly swollen. There is bony destructive change  throughout the distal phalanx a comminuted fracture that is transverse in orientation through the metaphysis and extends the articular surface. Bones are osteopenic. No other acute bony abnormality is seen. IMPRESSION: Findings most consistent with osteomyelitis and pathologic fracture of the distal phalanx of the right great toe. Electronically Signed   By: Drusilla Kanner M.D.   On: 12/27/2015 13:17   [place] Saphenous Vein Mapping Right  Result Date: 01/01/2016 CLINICAL DATA:  Right toe gangrene. Please evaluate patency and size of right greater saphenous vein for potential venous bypass. EXAM: RIGHT LOWER EXTREMITY VENOUS MAPPING. TECHNIQUE: Gray-scale sonography with graded compression, as well as color Doppler and duplex ultrasound were performed to evaluate the lower extremity deep venous systems from the level of the common femoral vein and including the common femoral, femoral, profunda femoral, popliteal and calf veins including the posterior tibial, peroneal and gastrocnemius veins when visible. The superficial great saphenous vein was also interrogated. Spectral Doppler was utilized to evaluate flow at rest and with distal augmentation maneuvers in the common femoral, femoral and popliteal veins. COMPARISON:  None. FINDINGS: The greater saphenous vein appears widely patent throughout its imaged course. The greater saphenous vein measures 0.5 cm proximally (image 5), approximately 0.5 cm at the level of the distal thigh (image 17), 0.4 cm at the level the proximal calf (image 21) and 0.3 cm at the level of the distal calf (image 29). The right common femoral vein  appears widely patent where imaged. IMPRESSION: Wide patency of the right greater saphenous vein with measurements as above. Electronically Signed   By: Simonne Come M.D.   On: 01/01/2016 07:56     LOS: 9 days   Jeoffrey Massed, MD  [facility] Pager:[phone]  If 7PM-7AM, please contact night-coverage [email or web address] Password  [email or web address] 01/05/2016, 1:58 PM

## 2016-01-05 NOTE — Care Management Note (Signed)
Case Management Note  Patient Details  Name: [name] MRN: [id number] Date of Birth: 05/01/1920  Subjective/Objective:   Patient with osteo in toe, scheduled for toe amputation today, and previously  N[facility],  N[facility] , to make sure they have patient, Lupita LeashDonna states she will put as referral. NCM will cont to follow for dc needs.                  Action/Plan:   Expected Discharge Date:                  Expected Discharge Plan:  Home w Home Health Services  In-House Referral:  NA  Discharge planning Services  CM Consult  Post Acute Care Choice:  Home Health Choice offered to:  Patient, Adult Children  DME Arranged:    DME Agency:     HH Arranged:  RN HH Agency:  [facility]  Status of Service:  In process, will continue to follow  If discussed at Long Length of Stay Meetings, dates discussed:    Additional Comments:  [name], Deverick Pruss [name], RN 01/05/2016, 2:22 PM

## 2016-01-05 NOTE — Progress Notes (Signed)
Patient lying in bed, no needs at this time, call light within reach 

## 2016-01-05 NOTE — Anesthesia Preprocedure Evaluation (Signed)
Anesthesia Evaluation  Patient identified by MRN, date of birth, ID band Patient awake    Reviewed: Allergy & Precautions, NPO status , reviewed documented beta blocker date and time   Airway Mallampati: I  TM Distance: >3 FB     Dental   Pulmonary neg pulmonary ROS,    breath sounds clear to auscultation       Cardiovascular hypertension, + CAD, + Past MI and + Peripheral Vascular Disease  + Valvular Problems/Murmurs  Rhythm:Regular Rate:Normal     Neuro/Psych    GI/Hepatic Neg liver ROS, GERD  ,  Endo/Other  negative endocrine ROS  Renal/GU negative Renal ROS     Musculoskeletal   Abdominal   Peds  Hematology   Anesthesia Other Findings   Reproductive/Obstetrics                             Anesthesia Physical Anesthesia Plan  ASA: III  Anesthesia Plan: MAC   Post-op Pain Management:  Regional for Post-op pain   Induction:   Airway Management Planned: Simple Face Mask  Additional Equipment:   Intra-op Plan:   Post-operative Plan:   Informed Consent: I have reviewed the patients History and Physical, chart, labs and discussed the procedure including the risks, benefits and alternatives for the proposed anesthesia with the patient or authorized representative who has indicated his/her understanding and acceptance.   Dental advisory given  Plan Discussed with: CRNA, Anesthesiologist and Surgeon  Anesthesia Plan Comments:         Anesthesia Quick Evaluation

## 2016-01-05 NOTE — Op Note (Addendum)
    Patient name: [name] MRN: [id number] DOB: 12/08/1920 Sex: female  12/27/2015 - 01/05/2016 Pre-operative Diagnosis: right great toe ischemia Post-operative diagnosis:  Same Surgeon:  Durene CalBrabham, Wells Assistants:  nurse Procedure:   Right great toe amputation including metatarsal head Anesthesia:  Ankle block Blood Loss:  See anesthesia record Specimens:  toe  Findings:  Good capillary bleeding  Indications:  The patient had percutaneous revascularization yesterday for a dead toe.  She comes I today for toe amputation.  Extensively discussed with family the need for the procedure and the possiblity of a more proximal amputation if this does not heal  Procedure:  The patient was identified in the holding area and [facility]  The patient was then placed supine on the table. regional anesthesia was administered.  The patient was prepped and draped in the usual sterile fashion.  A time out was called and antibiotics were administered.  A racquet type incision was made a th ebase of the right great toe.  A #10 blade was used to get down to the bone which was transected with bone cutters.  It was removed as a specimen.  I used ronguers to remove additional bone, including the metatarsal head.  I got back to healthy bone.  There was good capillary bleeding.  The wound was irrigated.  Hemostasis was achieved.  There was no obvious infection at the level of the amputation, so I elected to close the wound with interrupted 3-0 nylon suture. I did place a 4x4 with betadine between sutures into the wound cavity for pressure to assist with hemostasis.  Sterile dressings were applied.   Disposition:  To PACU in stable condition.   Juleen ChinaV. Wells [name], M.D. [facility] [place] Office: [phone] Pager:  [phone]

## 2016-01-05 NOTE — Progress Notes (Signed)
Subjective  - POD #1, that is post-angiography with tibial vessel intervention  No acute events overnight   Physical Exam:  Gangrenous right great toe       Assessment/Plan:  POD #1  I discussed with the family proceeding with a right great toe amputation.  They understand that she is still at risk for a more proximal amputation despite successful angiographic intervention yesterday.  The plan today is to proceed with a right great toe amputation all other questions were answered.  Durene CalBrabham, Wells 01/05/2016 3:21 PM --  Vitals:   01/05/16 0810 01/05/16 1141  BP: (!) 151/45 (!) 170/41  Pulse: 63 65  Resp: (!) 21 17  Temp: 97.7 F (36.5 C) 97.7 F (36.5 C)    Intake/Output Summary (Last 24 hours) at 01/05/16 1521 Last data filed at 01/05/16 0430  Gross per 24 hour  Intake              240 ml  Output             1250 ml  Net            -1010 ml     Laboratory CBC    Component Value Date/Time   WBC 7.3 01/05/2016 0223   HGB 10.3 (L) 01/05/2016 0223   HCT 31.6 (L) 01/05/2016 0223   PLT 392 01/05/2016 0223    BMET    Component Value Date/Time   NA 143 01/05/2016 0223   K 3.6 01/05/2016 0223   CL 111 01/05/2016 0223   CO2 24 01/05/2016 0223   GLUCOSE 123 (H) 01/05/2016 0223   BUN 11 01/05/2016 0223   CREATININE 1.17 (H) 01/05/2016 0223   CALCIUM 8.4 (L) 01/05/2016 0223   GFRNONAA 39 (L) 01/05/2016 0223   GFRAA 45 (L) 01/05/2016 0223    COAG Lab Results  Component Value Date   INR 1.07 01/05/2016   INR 1.18 01/04/2016   INR 1.15 12/27/2015   No results found for: PTT  Antibiotics Anti-infectives    Start     Dose/Rate Route Frequency Ordered Stop   01/05/16 1448  [MAR Hold]  cefUROXime (ZINACEF) 1.5 g in dextrose 5 % 50 mL IVPB     (MAR Hold since 01/05/16 1439)   1.5 g 100 mL/hr over 30 Minutes Intravenous To ShortStay Surgical 01/05/16 0821 01/06/16 1500   01/05/16 0600  ceFAZolin (ANCEF) IVPB 1 g/50 mL premix  Status:  Discontinued      Comments:  Send with pt to OR   1 g 100 mL/hr over 30 Minutes Intravenous To Short Stay 01/04/16 1916 01/05/16 0831   01/04/16 0200  [MAR Hold]  ceFAZolin (ANCEF) IVPB 2g/100 mL premix     (MAR Hold since 01/05/16 1439)   2 g 200 mL/hr over 30 Minutes Intravenous Every 12 hours 01/03/16 1502     01/02/16 1600  ceFAZolin (ANCEF) IVPB 1 g/50 mL premix  Status:  Discontinued     1 g 100 mL/hr over 30 Minutes Intravenous Every 8 hours 01/02/16 1204 01/03/16 1502   12/31/15 1230  ceFAZolin (ANCEF) IVPB 1 g/50 mL premix  Status:  Discontinued     1 g 100 mL/hr over 30 Minutes Intravenous Every 8 hours 12/31/15 1105 01/02/16 1204   12/28/15 1230  vancomycin (VANCOCIN) 500 mg in sodium chloride 0.9 % 100 mL IVPB  Status:  Discontinued     500 mg 100 mL/hr over 60 Minutes Intravenous Every 24 hours 12/27/15 1525 12/28/15 0952  12/28/15 1230  vancomycin (VANCOCIN) IVPB 750 mg/150 ml premix  Status:  Discontinued     750 mg 150 mL/hr over 60 Minutes Intravenous Every 24 hours 12/28/15 0952 12/31/15 1047   12/27/15 2030  piperacillin-tazobactam (ZOSYN) IVPB 3.375 g  Status:  Discontinued     3.375 g 12.5 mL/hr over 240 Minutes Intravenous Every 8 hours 12/27/15 1425 12/31/15 1047   12/27/15 1230  piperacillin-tazobactam (ZOSYN) IVPB 3.375 g     3.375 g 100 mL/hr over 30 Minutes Intravenous  Once 12/27/15 1217 12/27/15 1311   12/27/15 1230  vancomycin (VANCOCIN) IVPB 1000 mg/200 mL premix     1,000 mg 200 mL/hr over 60 Minutes Intravenous  Once 12/27/15 1217 12/27/15 1339       V. Charlena Cross, M.D. [facility] [place] Office: [phone] Pager:  [phone]

## 2016-01-05 NOTE — Progress Notes (Signed)
Patient Name: [name] Date of Encounter: 01/05/2016  Primary Cardiologist: Dr. Yvonne Kendall Problem List     Principal Problem:   Osteomyelitis of right foot [facility]) Active Problems:   CAD (coronary artery disease)   HTN (hypertension)   Hyperlipidemia   Heart murmur   Gangrene of toe of right foot (HCC)   Acute osteomyelitis of toe, left (HCC)     Subjective   Feels good.  Denies SOB or CP.  Inpatient Medications    Scheduled Meds: . amiodarone  100 mg Oral Daily  . amLODipine  2.5 mg Oral Daily  . aspirin EC  81 mg Oral Daily  . atorvastatin  20 mg Oral QODAY  .  ceFAZolin (ANCEF) IV  2 g Intravenous Q12H  . cefUROXime (ZINACEF)  IV  1.5 g Intravenous To SS-Surg  . docusate sodium  100 mg Oral Daily  . heparin  5,000 Units Subcutaneous Q8H  . levothyroxine  75 mcg Oral QAC breakfast  . pantoprazole  40 mg Oral Daily  . polyethylene glycol  17 g Oral BID  . vitamin B-12  1,000 mcg Oral Daily   Continuous Infusions: . sodium chloride 10 mL/hr at 01/02/16 1002  . sodium chloride     PRN Meds: acetaminophen **OR** acetaminophen, albuterol, alum & mag hydroxide-simeth, guaiFENesin-dextromethorphan, hydrALAZINE, HYDROcodone-acetaminophen, metoprolol, ondansetron **OR** ondansetron (ZOFRAN) IV, phenol   Vital Signs    Vitals:   01/05/16 0100 01/05/16 0200 01/05/16 0300 01/05/16 0810  BP: (!) 134/42 (!) 129/31 (!) 132/34 (!) 151/45  Pulse: 66 (!) 51 (!) 57 63  Resp: 19 17 20  (!) 21  Temp:   97.8 F (36.6 C) 97.7 F (36.5 C)  TempSrc:   Oral Oral  SpO2: 98% 97% 98% 98%  Weight:   120 lb 13 oz (54.8 kg)   Height:        Intake/Output Summary (Last 24 hours) at 01/05/16 1056 Last data filed at 01/05/16 0430  Gross per 24 hour  Intake              240 ml  Output             1250 ml  Net            -1010 ml   Filed Weights   01/03/16 0614 01/04/16 0623 01/05/16 0300  Weight: 117 lb 13.4 oz (53.4 kg) 121 lb (54.9 kg) 120 lb 13 oz (54.8 kg)     Physical Exam    GEN: Well nourished, well developed, in no acute distress.  HEENT: Grossly normal.  Neck: Supple, no JVD, carotid bruits, or masses. Cardiac: RRR, no murmurs, rubs, or gallops. No clubbing, cyanosis, edema.  Radials/DP/PT 2+ and equal bilaterally.  Respiratory:  Respirations regular and unlabored, clear to auscultation bilaterally. GI: Soft, nontender, nondistended, BS + x 4. MS: no deformity or atrophy. Skin: warm and dry, no rash. Neuro:  Strength and sensation are intact. Psych: AAOx3.  Normal affect.  Labs    CBC  Recent Labs  01/04/16 0417 01/05/16 0223  WBC 6.6 7.3  HGB 9.7* 10.3*  HCT 29.8* 31.6*  MCV 92.5 93.8  PLT 427* 392   Basic Metabolic Panel  Recent Labs  01/04/16 0417 01/05/16 0223  NA 142 143  K 3.4* 3.6  CL 113* 111  CO2 22 24  GLUCOSE 86 123*  BUN 10 11  CREATININE 1.23* 1.17*  CALCIUM 8.4* 8.4*   Liver Function Tests No results for input(s): AST,  ALT, ALKPHOS, BILITOT, PROT, ALBUMIN in the last 72 hours. No results for input(s): LIPASE, AMYLASE in the last 72 hours. Cardiac Enzymes No results for input(s): CKTOTAL, CKMB, CKMBINDEX, TROPONINI in the last 72 hours. BNP Invalid input(s): POCBNP D-Dimer No results for input(s): DDIMER in the last 72 hours. Hemoglobin A1C No results for input(s): HGBA1C in the last 72 hours. Fasting Lipid Panel No results for input(s): CHOL, HDL, LDLCALC, TRIG, CHOLHDL, LDLDIRECT in the last 72 hours. Thyroid Function Tests No results for input(s): TSH, T4TOTAL, T3FREE, THYROIDAB in the last 72 hours.  Invalid input(s): FREET3  Telemetry    NSR - Personally Reviewed  ECG    NSR - Personally Reviewed  Radiology    No results found.  Cardiac Studies   2D echo Study Conclusions  - Left ventricle: The cavity size was normal. Wall thickness was   increased in a pattern of moderate LVH. Systolic function was   normal. The estimated ejection fraction was in the range of 60%    to 65%. Wall motion was normal; there were no regional wall   motion abnormalities. Features are consistent with a pseudonormal   left ventricular filling pattern, with concomitant abnormal   relaxation and increased filling pressure (grade 2 diastolic   dysfunction). E/medial e&' > 15 suggests LV end diastolic pressure   at least 20 mmHg. - Aortic valve: Trileaflet; moderately calcified leaflets.   Sclerosis without stenosis. - Mitral valve: Moderately calcified annulus. Mildly calcified   leaflets . There was trivial regurgitation. - Left atrium: The atrium was mildly dilated. - Right ventricle: The cavity size was normal. Systolic function   was normal. - Pulmonary arteries: No complete TR doppler jet so unable to   estimate PA systolic pressure. - Inferior vena cava: The vessel was normal in size. The   respirophasic diameter changes were in the normal range (= 50%),   consistent with normal central venous pressure. - Pericardium, extracardiac: Small to moderate circumferential   pericardial effusion, no tamponade. Pleural effusion noted.  Impressions:  - Normal LV size with moderate LV hypertrophy. EF 60-65%. Moderate   diastolic dysfunction with evidence for elevated LV filling   pressure. Normal RV size and systolic function. Aortic sclerosis   without significant stenosis. Mild to moderate pericardial   effusion without tamponade.  Patient Profile     [name] a 83 y.o.female with history of mitral valve regurgitation, CAD with obstructive disease with 80% ostial LCx on medical treatment, chronic diastolic dysfunction last EF around 65%, paroxysmal atrial fibrillation not on anticoagulation due to fall risk and age, hypertension, dyslipidemia who lives at home and walks with a cane.  She was admitted with right toe gangrene with osteomyelitis and right toe distal phalanx pathological fracture.  Started on IV Abx/ found to have Staph lugdenesis bacteremia from  12/25 Sent to [facility] from [facility] on 12/31 for vascular surgical eval   Assessment & Plan    1.  ASCAD -  Activty level prior to hospitalization approx 3-4 METS. She Presents with gangrene of R gr toe.  No evid for CHF.  Overall, patient is at moderate increased risk for major cardiac event with vascular surgery  I do not think further cardiac testing would change therapy.   Would continue telemetry with close control of BP/HR throughout.  2D echo showed normal LVF. Continue ASA/statin.  2.  Diastolic dysfunction - noted on echo - need to be cautious with IVF to reduce risk of acute CHF.  Continue  diuretic as needed for volume overload.  3.  Mild to moderate pericardia effusion - this was noted on echo in 03/2015 and was mild then.  No evidence of tamponade and patient is asymptomatic.   4.  Paroxysmal atrial fibrillation - maintaining NSR with no PAF.  Continue Amio.  Not on anticoagulation - will defer to her primary Cardiologist Dr. [name]  5.    Signed, Armanda Magicraci Turner, MD  01/05/2016, 10:56 AM

## 2016-01-05 NOTE — Care Management Important Message (Signed)
Important Message  Patient Details  Name: [name] MRN: [id number] Date of Birth: 07/03/1920   Medicare Important Message Given:  Yes    Jonice Cerra 01/05/2016, 8:40 AM

## 2016-01-05 NOTE — Anesthesia Postprocedure Evaluation (Signed)
Anesthesia Post Note  Patient: [name]  Procedure(s) Performed: Procedure(s) (LRB): RIGHT GREAT TOE AMPUTATION (Right)  Patient location during evaluation: PACU Anesthesia Type: MAC Level of consciousness: awake Pain management: pain level controlled Respiratory status: spontaneous breathing Cardiovascular status: stable Anesthetic complications: no       Last Vitals:  Vitals:   01/05/16 1738 01/05/16 1745  BP: (!) 168/64   Pulse: 68   Resp: (!) 23   Temp:  36.8 C    Last Pain:  Vitals:   01/05/16 1730  TempSrc:   PainSc: 0-No pain                 Cleaster Shiffer

## 2016-01-05 NOTE — Transfer of Care (Signed)
Immediate Anesthesia Transfer of Care Note  Patient: [name]  Procedure(s) Performed: Procedure(s): RIGHT GREAT TOE AMPUTATION (Right)  Patient Location: PACU  Anesthesia Type:MAC combined with regional for post-op pain  Level of Consciousness: awake, alert , oriented and patient cooperative  Airway & Oxygen Therapy: Patient Spontanous Breathing and Patient connected to nasal cannula oxygen  Post-op Assessment: Report given to RN and Post -op Vital signs reviewed and stable  Post vital signs: Reviewed and stable  Last Vitals:  Vitals:   01/05/16 1141 01/05/16 1638  BP: (!) 170/41   Pulse: 65   Resp: 17   Temp: 36.5 C 36.4 C    Last Pain:  Vitals:   01/05/16 1141  TempSrc: Oral  PainSc:          Complications: No apparent anesthesia complications

## 2016-01-06 ENCOUNTER — Telehealth: Payer: Self-pay | Admitting: Surgery

## 2016-01-06 ENCOUNTER — Encounter (HOSPITAL_COMMUNITY): Payer: Self-pay | Admitting: Surgery

## 2016-01-06 DIAGNOSIS — E7800 Pure hypercholesterolemia, unspecified: Secondary | ICD-10-CM

## 2016-01-06 DIAGNOSIS — I313 Pericardial effusion (noninflammatory): Secondary | ICD-10-CM

## 2016-01-06 MED ORDER — SODIUM CHLORIDE 0.9% FLUSH: 10.0000 mL | INTRAVENOUS | Status: DC | PRN

## 2016-01-06 NOTE — Progress Notes (Signed)
Pharmacy Antibiotic Note  [name] is a 83 y.o. female admitted on 12/27/2015 with cellulitis, gangrene of toe, wound infection, (+) BCx.  Pharmacy has been consulted for Ancef dosing - day #7.    BCx 2/2 with Staph lugdunensis - MD discussed with ID over the phone who recommended a minimum of 4 weeks and to check a TTE. S/p angiogram on 1/2 and toe amputation on 1/3. SCr 1.17 stable, CrCl~26 ml/min. Afebrile, WBC wnl.  Plan: - Cefazolin 2g IV every 12 hours - Will continue to follow renal function, culture results, LOT (minimum 4 weeks per ID recs)  Height: 5\' 5"  (165.1 cm) Weight: 120 lb 13 oz (54.8 kg) IBW/kg (Calculated) : 57  Temp (24hrs), Avg:98 F (36.7 C), Min:97.6 F (36.4 C), Max:98.3 F (36.8 C)   Recent Labs Lab 01/01/16 0632 01/03/16 0344 01/03/16 2350 01/04/16 0417 01/05/16 0223  WBC  --   --   --  6.6 7.3  CREATININE 1.24* 1.22* 1.21* 1.23* 1.17*    Estimated Creatinine Clearance: 26 mL/min (by C-G formula based on SCr of 1.17 mg/dL (H)).    No Known Allergies  Antimicrobials this admission:  vanc 12/25 >> 12/29 zosyn 12/25 >>12/29  Ancef 12/29 >>  Microbiology results:  12/25 BCx: 2/2 Staphylococcus lugdunensis - oxacillin-sensitive 12/31 BCx >> ngtd   [name], PharmD, BCPS Clinical Pharmacist 01/06/2016 11:39 AM

## 2016-01-06 NOTE — Evaluation (Signed)
Physical Therapy Evaluation Patient Details Name: [name] MRN: [id number] DOB: 03-24-1920 Today's Date: 01/06/2016   History of Present Illness  Patient is a 83 y.o. female with history of CAD, chronic diastolic heart failure, paroxysmal atrial fibrillation not on anticoagulation who presented with right toe gangrene. She was found to have Staph lugdenesis bacteremia on 12/25. She was empirically started on IV antibiotics, vascular surgery was consulted and underwent angioplasty of the right peroneal artery and right tibioperoneal trunk on 1/2. Pt s/p right great toe amputation on 1/3.  Clinical Impression  Pt admitted with above diagnosis. Pt currently with functional limitations due to the deficits listed below (see PT Problem List). Pt will benefit from skilled PT to increase their independence and safety with mobility to allow discharge to the venue listed below.  Pt 1st day post- op R big toe amputation.  Pt was pleasant and cooperative throughout session.  Pt will need short term SNF rehab and she and family are in agreement. She and son are hoping for [facility] for SN[facility].     Follow Up Recommendations SNF;Supervision for mobility/OOB    Equipment Recommendations  None recommended by PT    Recommendations for Other Services       Precautions / Restrictions Precautions Required Braces or Orthoses: Other Brace/Splint Other Brace/Splint: hard sole post-op shoe Restrictions Weight Bearing Restrictions: Yes RLE Weight Bearing: Partial weight bearing (Heel only with post-op shoe) RLE Partial Weight Bearing Percentage or Pounds: Heel only      Mobility  Bed Mobility Overal bed mobility: Modified Independent             General bed mobility comments: HOB slightly elevated and with heavy use of rail  Transfers Overall transfer level: Needs assistance Equipment used: Rolling walker (2 wheeled) Transfers: Sit to/from Stand Sit to Stand: Min assist         General  transfer comment: MIN A to power up and for hand placement  Ambulation/Gait Ambulation/Gait assistance: Min assist Ambulation Distance (Feet): 10 Feet Assistive device: Rolling walker (2 wheeled) Gait Pattern/deviations: Decreased step length - right;Trunk flexed     General Gait Details: Pt with slow deliberate gait with step through pattern and fatigued quickly  Stairs            Wheelchair Mobility    Modified Rankin (Stroke Patients Only)       Balance Overall balance assessment: Needs assistance   Sitting balance-Leahy Scale: Good       Standing balance-Leahy Scale: Poor Standing balance comment: requires UE support due to new WB status                             Pertinent Vitals/Pain Pain Assessment: No/denies pain    Home Living Family/patient expects to be discharged to:: Skilled nursing facility [facility]) Living Arrangements: Alone                    Prior Function Level of Independence: Independent               Hand Dominance   Dominant Hand: Right    Extremity/Trunk Assessment   Upper Extremity Assessment Upper Extremity Assessment: Overall WFL for tasks assessed    Lower Extremity Assessment Lower Extremity Assessment: RLE deficits/detail RLE Deficits / Details: R foot wrapped secondary to great toe amputation    Cervical / Trunk Assessment Cervical / Trunk Assessment: Normal  Communication  Communication: No difficulties  Cognition Arousal/Alertness: Awake/alert Behavior During Therapy: WFL for tasks assessed/performed Overall Cognitive Status: Within Functional Limits for tasks assessed                      General Comments General comments (skin integrity, edema, etc.): Pt fit with post-op shoe right before PT eval    Exercises     Assessment/Plan    PT Assessment Patient needs continued PT services  PT Problem List Decreased activity tolerance;Decreased balance;Decreased  mobility;Decreased knowledge of use of DME;Decreased knowledge of precautions          PT Treatment Interventions DME instruction;Gait training;Functional mobility training;Therapeutic activities;Therapeutic exercise;Balance training    PT Goals (Current goals can be found in the Care Plan section)  Acute Rehab PT Goals Patient Stated Goal: To go the [facility] PT Goal Formulation: With patient Time For Goal Achievement: 01/13/16 Potential to Achieve Goals: Good    Frequency Min 3X/week   Barriers to discharge Decreased caregiver support;Inaccessible home environment lives alone with 2 steps to enter    Co-evaluation               End of Session Equipment Utilized During Treatment: Gait belt Activity Tolerance: Patient tolerated treatment well Patient left: in chair;with call bell/phone within reach;with nursing/sitter in room;with family/visitor present Nurse Communication: Mobility status         Time: [id number] PT Time Calculation (min) (ACUTE ONLY): 24 min   Charges:   PT Evaluation $PT Eval Moderate Complexity: 1 Procedure PT Treatments $Gait Training: 8-22 mins   PT G Codes:        Kathi Dohn LUBECK 01/06/2016, 2:02 PM

## 2016-01-06 NOTE — Progress Notes (Signed)
PT Cancellation Note  Patient Details Name: [name] MRN: [id number] DOB: 10/25/1920   Cancelled Treatment:    Reason Eval/Treat Not Completed: Patient not medically ready. Awaiting post-op shoe and once it arrives will complete PT eval.   [name] LUBECK 01/06/2016, 11:47 AM

## 2016-01-06 NOTE — Telephone Encounter (Signed)
No VM set up, mailed letter for appt on 1/22 for PA to see

## 2016-01-06 NOTE — Telephone Encounter (Signed)
-----   Message from Sharee PimpleMarilyn K McChesney, RN sent at 01/06/2016 12:01 PM EST ----- Regarding: 3 weeks    ----- Message ----- From: Lars MageEmma M Collins, PA-C Sent: 01/06/2016  10:52 AM To: Vvs Charge Pool  F/U with Myra GianottiBrabham in [place] 3 weeks needs arterial duplex right LE

## 2016-01-06 NOTE — Progress Notes (Signed)
Orthopedic Tech Progress Note Patient Details:  [name] 2/7/[name] [id number]  Ortho Devices Type of Ortho Device: Postop shoe/boot Ortho Device/Splint Location: Applied Postop shoe for Right Foot.  Pt tolerated well. PT at bedside.  (Right Foot) Ortho Device/Splint Interventions: Application, Adjustment   [name], [name] 01/06/2016, 1:43 PM

## 2016-01-06 NOTE — Progress Notes (Signed)
PROGRESS NOTE        PATIENT DETAILS Name: [name] Age: 83 y.o. Sex: female Date of Birth: 20-Sep-1920 Admit Date: 12/27/2015 Admitting Physician Leroy Sea, MD ZOX:[id number], Chancy Hurter, MD  Brief Narrative: Patient is a 83 y.o. female with history of CAD, chronic diastolic heart failure, paroxysmal atrial fibrillation not on anticoagulation who presented with right toe gangrene. She was found to have Staph lugdenesis bacteremia on 12/25. She was empirically started on IV antibiotics, vascular surgery was consulted and underwent angioplasty of the right peroneal artery and right tibioperoneal trunk on 1/2. Underwent right great toe amputation on 1/3. See below for further details  Subjective: Lying comfortably in bed. Pain is adequately controlled  Assessment/Plan: Right great toe gangrene with peripheral vascular disease: Seen by [facility]-underwent angioplasty of the right peroneal artery and right tibioperoneal trunk on 1/2.Subsequently underwent right great toe amputation on 1/3. Ambulated with PT-SNF likely on 1/5.  Staph lugdenensis bacteremia: Likely secondary to above, blood cultures on 12/25 positive for staph lugdenensis. Repeat Blood cultures on 12/31 negative. Dr. Jomarie Longs spoke with Dr. Drue Second on 12/31-and recommendations were to puruse echo, and plan for at least 4 weeks of IV Ancef.  TTE with no obvious vegetation-doubt any benefit pursuing TEE on this 83 year old patient will change management outcome. Spoke with Dr Campbell-ID-today-recommends atleast 3-4 weeks of Ancef from 01/02/16(neg cultures). Agree's that TEE not indicated in this situation.Place PICC line today  CAD: No chest pain or shortness of breath. Seen by cardiology preoperatively-no further workup required prior to proceeding with amputation. Continue aspirin and statin.   Mild to moderate pericardial effusion: Seems to be a chronic issue-has seen on prior echo on 03/23/68. No  tamponade pathophysiology.   Paroxysmal atrial fibrillation: Continue amiodarone, not on anticoagulation likely secondary to advanced age and risk of falls.   Chronic diastolic dysfunction (EF 60-65% on TTE 01/04/16): Clinically compensated  Hypothyroidism: Continue levothyroxine  Dyslipidemia: Continue statin  GERD: Continue PPI  DVT Prophylaxis: Prophylactic Heparin   Code Status:  DNR  Family Communication: Numerous falls at bedside  Disposition Plan: Remain inpatient-likely SNF on discharge on 1/5  Antimicrobial agents: Anti-infectives    Start     Dose/Rate Route Frequency Ordered Stop   01/05/16 1448  cefUROXime (ZINACEF) 1.5 g in dextrose 5 % 50 mL IVPB     1.5 g 100 mL/hr over 30 Minutes Intravenous To ShortStay Surgical 01/05/16 0821 01/06/16 1500   01/05/16 0600  ceFAZolin (ANCEF) IVPB 1 g/50 mL premix  Status:  Discontinued    Comments:  Send with pt to OR   1 g 100 mL/hr over 30 Minutes Intravenous To Short Stay 01/04/16 1916 01/05/16 0831   01/04/16 0200  ceFAZolin (ANCEF) IVPB 2g/100 mL premix     2 g 200 mL/hr over 30 Minutes Intravenous Every 12 hours 01/03/16 1502     01/02/16 1600  ceFAZolin (ANCEF) IVPB 1 g/50 mL premix  Status:  Discontinued     1 g 100 mL/hr over 30 Minutes Intravenous Every 8 hours 01/02/16 1204 01/03/16 1502   12/31/15 1230  ceFAZolin (ANCEF) IVPB 1 g/50 mL premix  Status:  Discontinued     1 g 100 mL/hr over 30 Minutes Intravenous Every 8 hours 12/31/15 1105 01/02/16 1204   12/28/15 1230  vancomycin (VANCOCIN) 500 mg in sodium chloride 0.9 % 100 mL  IVPB  Status:  Discontinued     500 mg 100 mL/hr over 60 Minutes Intravenous Every 24 hours 12/27/15 1525 12/28/15 0952   12/28/15 1230  vancomycin (VANCOCIN) IVPB 750 mg/150 ml premix  Status:  Discontinued     750 mg 150 mL/hr over 60 Minutes Intravenous Every 24 hours 12/28/15 0952 12/31/15 1047   12/27/15 2030  piperacillin-tazobactam (ZOSYN) IVPB 3.375 g  Status:  Discontinued       3.375 g 12.5 mL/hr over 240 Minutes Intravenous Every 8 hours 12/27/15 1425 12/31/15 1047   12/27/15 1230  piperacillin-tazobactam (ZOSYN) IVPB 3.375 g     3.375 g 100 mL/hr over 30 Minutes Intravenous  Once 12/27/15 1217 12/27/15 1311   12/27/15 1230  vancomycin (VANCOCIN) IVPB 1000 mg/200 mL premix     1,000 mg 200 mL/hr over 60 Minutes Intravenous  Once 12/27/15 1217 12/27/15 1339      Procedures: Echo 1/2>>Normal LV size with moderate LV hypertrophy. EF 60-65%. Moderate diastolic dysfunction with evidence for elevated LV filling  pressure. Normal RV size and systolic function. Aortic sclerosis without significant stenosis. Mild to moderate pericardial effusion without tamponade.  CONSULTS:  cardiology and vascular surgery  Time spent: 25- minutes-Greater than 50% of this time was spent in counseling, explanation of diagnosis, planning of further management, and coordination of care.  MEDICATIONS: Scheduled Meds: . amiodarone  100 mg Oral Daily  . amLODipine  2.5 mg Oral Daily  . aspirin EC  81 mg Oral Daily  . atorvastatin  20 mg Oral QODAY  .  ceFAZolin (ANCEF) IV  2 g Intravenous Q12H  . cefUROXime (ZINACEF)  IV  1.5 g Intravenous To SS-Surg  . docusate sodium  100 mg Oral Daily  . heparin  5,000 Units Subcutaneous Q8H  . levothyroxine  75 mcg Oral QAC breakfast  . pantoprazole  40 mg Oral Daily  . polyethylene glycol  17 g Oral BID  . vitamin B-12  1,000 mcg Oral Daily   Continuous Infusions:  PRN Meds:.acetaminophen **OR** acetaminophen, albuterol, alum & mag hydroxide-simeth, guaiFENesin-dextromethorphan, hydrALAZINE, HYDROcodone-acetaminophen, metoprolol, ondansetron **OR** ondansetron (ZOFRAN) IV, phenol, sodium chloride flush   PHYSICAL EXAM: Vital signs: Vitals:   01/05/16 1745 01/05/16 2017 01/06/16 0636 01/06/16 1351  BP: (!) 163/52 (!) 130/53 (!) 156/50 (!) 141/53  Pulse: 66 64 69 68  Resp: 20 18 18 19   Temp: 98.2 F (36.8 C) 98.3 F (36.8 C) 98.3  F (36.8 C)   TempSrc: Oral Oral Oral   SpO2: 99% 96% 97% 98%  Weight:      Height:       Filed Weights   01/03/16 0614 01/04/16 0623 01/05/16 0300  Weight: 53.4 kg (117 lb 13.4 oz) 54.9 kg (121 lb) 54.8 kg (120 lb 13 oz)   Body mass index is 20.1 kg/m.   General appearance :Awake, alert, not in any distress. Speech Clear. Eyes:, pupils equally reactive to light and accomodation HEENT: Atraumatic and Normocephalic Neck: supple, no JVD. No cervical lymphadenopathy. No thyromegaly Resp:Good air entry bilaterally, no added sounds  CVS: S1 S2 regular, no murmurs.  GI: Bowel sounds present, Non tender and not distended with no gaurding, rigidity or rebound.No organomegaly Extremities: B/L Lower Ext shows no edema. Right foot wrapped-did not open Neurology:  speech clear,Non focal, sensation is grossly intact. Musculoskeletal:No digital cyanosis Skin:No Rash, warm and dry Wounds:N/A  I have personally reviewed following labs and imaging studies  LABORATORY DATA: CBC:  Recent Labs Lab 01/04/16 0417 01/05/16 0223  WBC 6.6 7.3  HGB 9.7* 10.3*  HCT 29.8* 31.6*  MCV 92.5 93.8  PLT 427* 392    Basic Metabolic Panel:  Recent Labs Lab 01/01/16 0632 01/03/16 0344 01/03/16 2350 01/04/16 0417 01/05/16 0223  NA 141 141 141 142 143  K 3.5 3.6 3.3* 3.4* 3.6  CL 112* 113* 112* 113* 111  CO2 24 22 21* 22 24  GLUCOSE 94 77 96 86 123*  BUN 13 11 10 10 11   CREATININE 1.24* 1.22* 1.21* 1.23* 1.17*  CALCIUM 8.2* 8.5* 8.2* 8.4* 8.4*    GFR: Estimated Creatinine Clearance: 26 mL/min (by C-G formula based on SCr of 1.17 mg/dL (H)).  Liver Function Tests: No results for input(s): AST, ALT, ALKPHOS, BILITOT, PROT, ALBUMIN in the last 168 hours. No results for input(s): LIPASE, AMYLASE in the last 168 hours. No results for input(s): AMMONIA in the last 168 hours.  Coagulation Profile:  Recent Labs Lab 01/04/16 0417 01/05/16 0223  INR 1.18 1.07    Cardiac Enzymes: No  results for input(s): CKTOTAL, CKMB, CKMBINDEX, TROPONINI in the last 168 hours.  BNP (last 3 results) No results for input(s): PROBNP in the last 8760 hours.  HbA1C: No results for input(s): HGBA1C in the last 72 hours.  CBG: No results for input(s): GLUCAP in the last 168 hours.  Lipid Profile: No results for input(s): CHOL, HDL, LDLCALC, TRIG, CHOLHDL, LDLDIRECT in the last 72 hours.  Thyroid Function Tests: No results for input(s): TSH, T4TOTAL, FREET4, T3FREE, THYROIDAB in the last 72 hours.  Anemia Panel: No results for input(s): VITAMINB12, FOLATE, FERRITIN, TIBC, IRON, RETICCTPCT in the last 72 hours.  Urine analysis: No results found for: COLORURINE, APPEARANCEUR, LABSPEC, PHURINE, GLUCOSEU, HGBUR, BILIRUBINUR, KETONESUR, PROTEINUR, UROBILINOGEN, NITRITE, LEUKOCYTESUR  Sepsis Labs: Lactic Acid, Venous    Component Value Date/Time   LATICACIDVEN 1.3 12/27/2015 1428    MICROBIOLOGY: Recent Results (from the past 240 hour(s))  Surgical pcr screen     Status: None   Collection Time: 12/30/15  2:20 AM  Result Value Ref Range Status   MRSA, PCR NEGATIVE NEGATIVE Final   Staphylococcus aureus NEGATIVE NEGATIVE Final    Comment:        The Xpert SA Assay (FDA approved for NASAL specimens in patients over 50 years of age), is one component of a comprehensive surveillance program.  Test performance has been validated by [facility] for patients greater than or equal to 39 year old. It is not intended to diagnose infection nor to guide or monitor treatment.   Culture, blood (routine x 2)     Status: None (Preliminary result)   Collection Time: 01/02/16 11:16 AM  Result Value Ref Range Status   Specimen Description BLOOD BLOOD RIGHT ARM  Final   Special Requests IN PEDIATRIC BOTTLE  Final   Culture NO GROWTH 3 DAYS  Final   Report Status PENDING  Incomplete  Culture, blood (routine x 2)     Status: None (Preliminary result)   Collection Time: 01/02/16 11:29  AM  Result Value Ref Range Status   Specimen Description BLOOD BLOOD LEFT HAND  Final   Special Requests IN PEDIATRIC BOTTLE  Final   Culture NO GROWTH 3 DAYS  Final   Report Status PENDING  Incomplete  Surgical pcr screen     Status: None   Collection Time: 01/03/16  3:55 PM  Result Value Ref Range Status   MRSA, PCR NEGATIVE NEGATIVE Final   Staphylococcus aureus NEGATIVE NEGATIVE Final  Comment:        The Xpert SA Assay (FDA approved for NASAL specimens in patients over 5 years of age), is one component of a comprehensive surveillance program.  Test performance has been validated by [facility] for patients greater than or equal to 41 year old. It is not intended to diagnose infection nor to guide or monitor treatment.     RADIOLOGY STUDIES/RESULTS: US Arterial Seg Single  Result Date: 12/28/2015 CLINICAL DATA:  Nonhealing right great toe wound, gangrene, osteomyelitis EXAM: NONINVASIVE PHYSIOLOGIC VASCULAR STUDY OF BILATERAL LOWER EXTREMITIES TECHNIQUE: Evaluation of both lower extremities were performed at rest, including calculation of ankle-brachial indices with single level Doppler, pressure and pulse volume recording. COMPARISON:  None available FINDINGS: Right ABI:  0.58 Left ABI:  0.86 Right Lower Extremity: Irregular monophasic right posterior tibial waveform. Biphasic right dorsalis pedis waveform. Markedly decreased right ABI measuring 0.58 indicative of significant or moderate range right lower extremity peripheral vascular disease. Left Lower Extremity: Biphasic left posterior tibial and dorsalis pedis waveforms. Mildly decreased ABI measuring 0.86. Suspect nonocclusive left lower extremity vascular disease. IMPRESSION: Right ABI 0.58 indicative of moderate range right lower extremity peripheral vascular disease. Minor decrease in the left ABI as above. Electronically Signed   By: Judie Petit.  Shick M.D.   On: 12/28/2015 12:21   [place] Lower Ext Art Right  Result Date:  12/28/2015 CLINICAL DATA:  Right great toe gangrene and osteomyelitis. EXAM: UNILATERAL RIGHT LOWER EXTREMITY ARTERIAL DUPLEX SCAN TECHNIQUE: Gray-scale sonography as well as color Doppler and duplex ultrasound was performed to evaluate the arteries of the lower extremity. COMPARISON:  ABI examination 12/28/2015 FINDINGS: Atherosclerotic calcifications in the right common femoral artery with a monophasic waveform. Deep femoral artery is patent with monophasic waveform. Calcifications and plaque in the right SFA. Monophasic waveforms throughout the right SFA. Peak systolic velocity in the proximal right SFA is 133 cm/sec and the peak systolic velocity in the mid SFA is 221 cm/sec. Atherosclerotic calcifications in the right popliteal artery with monophasic waveform. Monophasic waveform in the right peroneal artery. Monophasic waveform in the right posterior tibial artery. The distal right posterior tibial artery may be occluded. Monophasic waveform in the anterior tibial artery. Markedly elevated peak systolic velocity in the proximal anterior tibial artery measuring up to 496 cm/sec, this is compatible with stenosis. IMPRESSION: Diffuse atherosclerotic disease throughout the right lower extremity arteries. Right runoff disease demonstrated by stenosis in the right anterior tibial artery and occlusion or minimal flow in the distal posterior tibial artery. At least mild stenosis in the mid right SFA. Monophasic waveforms throughout the right lower extremity may signify underlying inflow disease. Electronically Signed   By: Richarda Overlie M.D.   On: 12/28/2015 14:41   Dg Chest Port 1 View  Result Date: 12/27/2015 CLINICAL DATA:  Cough.  Right great toe infection. EXAM: PORTABLE CHEST 1 VIEW COMPARISON:  PA and lateral chest 05/14/2009. Single-view of the chest 05/10/2009 and 04/25/2007. FINDINGS: There is cardiomegaly and vascular congestion. No consolidative process, pneumothorax or effusion. IMPRESSION:  Cardiomegaly and pulmonary vascular congestion. Electronically Signed   By: Drusilla Kanner M.D.   On: 12/27/2015 14:48   Dg Foot Complete Right  Result Date: 12/27/2015 CLINICAL DATA:  The patient tried her great toenail her of this week with onset of swelling, wound in bowel odor. The toe is discolored. Initial encounter. EXAM: RIGHT FOOT COMPLETE - 3+ VIEW COMPARISON:  None. FINDINGS: Soft tissues of the great toe are markedly swollen. There is bony  destructive change throughout the distal phalanx a comminuted fracture that is transverse in orientation through the metaphysis and extends the articular surface. Bones are osteopenic. No other acute bony abnormality is seen. IMPRESSION: Findings most consistent with osteomyelitis and pathologic fracture of the distal phalanx of the right great toe. Electronically Signed   By: Drusilla Kanner M.D.   On: 12/27/2015 13:17   [place] Saphenous Vein Mapping Right  Result Date: 01/01/2016 CLINICAL DATA:  Right toe gangrene. Please evaluate patency and size of right greater saphenous vein for potential venous bypass. EXAM: RIGHT LOWER EXTREMITY VENOUS MAPPING. TECHNIQUE: Gray-scale sonography with graded compression, as well as color Doppler and duplex ultrasound were performed to evaluate the lower extremity deep venous systems from the level of the common femoral vein and including the common femoral, femoral, profunda femoral, popliteal and calf veins including the posterior tibial, peroneal and gastrocnemius veins when visible. The superficial great saphenous vein was also interrogated. Spectral Doppler was utilized to evaluate flow at rest and with distal augmentation maneuvers in the common femoral, femoral and popliteal veins. COMPARISON:  None. FINDINGS: The greater saphenous vein appears widely patent throughout its imaged course. The greater saphenous vein measures 0.5 cm proximally (image 5), approximately 0.5 cm at the level of the distal thigh (image 17),  0.4 cm at the level the proximal calf (image 21) and 0.3 cm at the level of the distal calf (image 29). The right common femoral vein  appears widely patent where imaged. IMPRESSION: Wide patency of the right greater saphenous vein with measurements as above. Electronically Signed   By: Simonne Come M.D.   On: 01/01/2016 07:56     LOS: 10 days   Jeoffrey Massed, MD  [facility] Pager:[phone]  If 7PM-7AM, please contact night-coverage [email or web address] Password [email or web address] 01/06/2016, 2:43 PM

## 2016-01-06 NOTE — NC FL2 (Signed)
[place] MEDICAID FL2 LEVEL OF CARE SCREENING TOOL     IDENTIFICATION  Patient Name: [name] Birthdate: August 15, 1920 Sex: female Admission Date (Current Location): 12/27/2015  [facility] and [place] Number:  Producer, television/film/video and Address:  [facility], [address], [place], [place] [address]      Provider Number: [id number]  Attending Physician Name and Address:  Maretta Bees, MD  Relative Name and Phone Number:  Shell Blanchette, Patient Information     Current Level of Care: Hospital Recommended Level of Care: Skilled Nursing Facility Prior Approval Number:    Date Approved/Denied: 01/06/16 PASRR Number: [id number]  Discharge Plan: SNF    Current Diagnoses: Patient Active Problem List   Diagnosis Date Noted  . Bacteremia   . Acute osteomyelitis of toe, left (HCC)   . Osteomyelitis of right foot (HCC) 12/27/2015  . Gangrene of toe of right foot (HCC) 12/27/2015  . Gangrene of foot (HCC)   . Toe infection   . CAD (coronary artery disease) 03/26/2013  . HTN (hypertension) 03/26/2013  . Hyperlipidemia 03/26/2013  . Heart murmur 03/26/2013    Orientation RESPIRATION BLADDER Height & Weight     Time, Situation, Place, Self  Normal Continent Weight: 120 lb 13 oz (54.8 kg) Height:  5\' 5"  (165.1 cm)  BEHAVIORAL SYMPTOMS/MOOD NEUROLOGICAL BOWEL NUTRITION STATUS      Continent  (diet heart healthy)  AMBULATORY STATUS COMMUNICATION OF NEEDS Skin   Limited Assist Verbally Normal                       Personal Care Assistance Level of Assistance  Bathing, Feeding, Dressing Bathing Assistance: Limited assistance Feeding assistance: Independent Dressing Assistance: Limited assistance     Functional Limitations Info  Sight, Hearing, Speech Sight Info: Impaired Hearing Info: Impaired Speech Info: Impaired    SPECIAL CARE FACTORS FREQUENCY  PT (By licensed PT), OT (By licensed OT)     PT Frequency: 5x week OT Frequency:  5x week            Contractures Contractures Info: Not present    Additional Factors Info  Code Status Code Status Info: DNR             Current Medications (01/06/2016):  This is the current hospital active medication list Current Facility-Administered Medications  Medication Dose Route Frequency Provider Last Rate Last Dose  . acetaminophen (TYLENOL) tablet 325-650 mg  325-650 mg Oral Q4H PRN Nada Libman, MD       Or  . acetaminophen (TYLENOL) suppository 325-650 mg  325-650 mg Rectal Q4H PRN Nada Libman, MD      . albuterol (PROVENTIL) (2.5 MG/3ML) 0.083% nebulizer solution 2.5 mg  2.5 mg Nebulization Q4H PRN Leroy Sea, MD      . alum & mag hydroxide-simeth (MAALOX/MYLANTA) 200-200-20 MG/5ML suspension 15-30 mL  15-30 mL Oral Q2H PRN Nada Libman, MD      . amiodarone (PACERONE) tablet 100 mg  100 mg Oral Daily Leroy Sea, MD   100 mg at 01/06/16 1123  . amLODipine (NORVASC) tablet 2.5 mg  2.5 mg Oral Daily Leroy Sea, MD   2.5 mg at 01/06/16 1122  . aspirin EC tablet 81 mg  81 mg Oral Daily Leroy Sea, MD   81 mg at 01/06/16 1121  . atorvastatin (LIPITOR) tablet 20 mg  20 mg Oral QODAY Leroy Sea, MD   20  mg at 01/05/16 1826  . ceFAZolin (ANCEF) IVPB 2g/100 mL premix  2 g Intravenous Q12H Ann Heldlizabeth J Martin, RPH   2 g at 01/06/16 0134  . docusate sodium (COLACE) capsule 100 mg  100 mg Oral Daily Nada LibmanVance W Brabham, MD   100 mg at 01/06/16 1123  . guaiFENesin-dextromethorphan (ROBITUSSIN DM) 100-10 MG/5ML syrup 15 mL  15 mL Oral Q4H PRN Nada LibmanVance W Brabham, MD      . heparin injection 5,000 Units  5,000 Units Subcutaneous Q8H Leroy SeaPrashant K Singh, MD   5,000 Units at 01/06/16 [phone]  . hydrALAZINE (APRESOLINE) injection 10 mg  10 mg Intravenous Q6H PRN Leroy SeaPrashant K Singh, MD   10 mg at 01/04/16 1819  . HYDROcodone-acetaminophen (NORCO/VICODIN) 5-325 MG per tablet 1 tablet  1 tablet Oral Q6H PRN Leroy SeaPrashant K Singh, MD      . levothyroxine (SYNTHROID,  LEVOTHROID) tablet 75 mcg  75 mcg Oral QAC breakfast Leroy SeaPrashant K Singh, MD   75 mcg at 01/06/16 [phone]  . metoprolol (LOPRESSOR) injection 5 mg  5 mg Intravenous Q4H PRN Leroy SeaPrashant K Singh, MD      . ondansetron (ZOFRAN) tablet 4 mg  4 mg Oral Q6H PRN Leroy SeaPrashant K Singh, MD       Or  . ondansetron (ZOFRAN) injection 4 mg  4 mg Intravenous Q6H PRN Leroy SeaPrashant K Singh, MD      . pantoprazole (PROTONIX) EC tablet 40 mg  40 mg Oral Daily Leroy SeaPrashant K Singh, MD   40 mg at 01/06/16 1121  . phenol (CHLORASEPTIC) mouth spray 1 spray  1 spray Mouth/Throat PRN Nada LibmanVance W Brabham, MD      . polyethylene glycol (MIRALAX / GLYCOLAX) packet 17 g  17 g Oral BID Leroy SeaPrashant K Singh, MD   17 g at 01/03/16 0959  . sodium chloride flush (NS) 0.9 % injection 10-40 mL  10-40 mL Intracatheter PRN Maretta BeesShanker M Ghimire, MD      . vitamin B-12 (CYANOCOBALAMIN) tablet 1,000 mcg  1,000 mcg Oral Daily Leroy SeaPrashant K Singh, MD   1,000 mcg at 01/06/16 1121     Discharge Medications: Please see discharge summary for a list of discharge medications.  Relevant Imaging Results:  Relevant Lab Results:   Additional Information SSN: [id number]  Althea CharonAshley C Afia Messenger, LCSW

## 2016-01-06 NOTE — Clinical Social Work Placement (Signed)
   CLINICAL SOCIAL WORK PLACEMENT  NOTE  Date:  01/06/2016  Patient Details  Name: [name] MRN: [id number] Date of Birth: 10/22/1920  Clinical Social Work is seeking post-discharge placement for this patient at the Skilled  Nursing Facility level of care (*CSW will initial, date and re-position this form in  chart as items are completed):  Yes   Patient/family provided with [facility] Clinical Social Work Department's list of facilities offering this level of care within the geographic area requested by the patient (or if unable, by the patient's family).  Yes   Patient/family informed of their freedom to choose among providers that offer the needed level of care, that participate in Medicare, Medicaid or managed care program needed by the patient, have an available bed and are willing to accept the patient.  Yes   Patient/family informed of [facility]'s ownership interest in [facility] and [facility], as well as of the fact that they are under no obligation to receive care at these facilities.  PASRR submitted to EDS on       PASRR number received on 01/06/16     Existing PASRR number confirmed on       FL2 transmitted to all facilities in geographic area requested by pt/family on       FL2 transmitted to all facilities within larger geographic area on       Patient informed that his/her managed care company has contracts with or will negotiate with certain facilities, including the following:            Patient/family informed of bed offers received.  Patient chooses bed at       Physician recommends and patient chooses bed at      Patient to be transferred to   on  .  Patient to be transferred to facility by       Patient family notified on   of transfer.  Name of family member notified:        PHYSICIAN Please sign FL2     Additional Comment:    _______________________________________________ Althea CharonAshley C Kaleem Sartwell, LCSW 01/06/2016, 3:39 PM

## 2016-01-06 NOTE — Progress Notes (Signed)
Patient Name: [name] Date of Encounter: 01/06/2016  Primary Cardiologist: Dr. Yvonne Kendall Problem List     Principal Problem:   Osteomyelitis of right foot [facility]) Active Problems:   CAD (coronary artery disease)   HTN (hypertension)   Hyperlipidemia   Heart murmur   Gangrene of toe of right foot (HCC)   Acute osteomyelitis of toe, left (HCC)   Bacteremia     Subjective   Feels good.  Denies SOB or CP.  Inpatient Medications    Scheduled Meds: . amiodarone  100 mg Oral Daily  . amLODipine  2.5 mg Oral Daily  . aspirin EC  81 mg Oral Daily  . atorvastatin  20 mg Oral QODAY  .  ceFAZolin (ANCEF) IV  2 g Intravenous Q12H  . cefUROXime (ZINACEF)  IV  1.5 g Intravenous To SS-Surg  . docusate sodium  100 mg Oral Daily  . heparin  5,000 Units Subcutaneous Q8H  . levothyroxine  75 mcg Oral QAC breakfast  . pantoprazole  40 mg Oral Daily  . polyethylene glycol  17 g Oral BID  . vitamin B-12  1,000 mcg Oral Daily   Continuous Infusions: . sodium chloride 75 mL/hr at 01/05/16 2206   PRN Meds: acetaminophen **OR** acetaminophen, albuterol, alum & mag hydroxide-simeth, guaiFENesin-dextromethorphan, hydrALAZINE, HYDROcodone-acetaminophen, metoprolol, ondansetron **OR** ondansetron (ZOFRAN) IV, phenol   Vital Signs    Vitals:   01/05/16 1738 01/05/16 1745 01/05/16 2017 01/06/16 0636  BP: (!) 168/64 (!) 163/52 (!) 130/53 (!) 156/50  Pulse: 68 66 64 69  Resp: (!) 23 20 18 18   Temp:  98.2 F (36.8 C) 98.3 F (36.8 C) 98.3 F (36.8 C)  TempSrc:  Oral Oral Oral  SpO2: 99% 99% 96% 97%  Weight:      Height:        Intake/Output Summary (Last 24 hours) at 01/06/16 0903 Last data filed at 01/06/16 0856  Gross per 24 hour  Intake              940 ml  Output              100 ml  Net              840 ml   Filed Weights   01/03/16 0614 01/04/16 0623 01/05/16 0300  Weight: 117 lb 13.4 oz (53.4 kg) 121 lb (54.9 kg) 120 lb 13 oz (54.8 kg)    Physical Exam    GEN: Well nourished, well developed, in no acute distress.  HEENT: Grossly normal.  Neck: Supple, no JVD, carotid bruits, or masses. Cardiac: RRR, no murmurs, rubs, or gallops. No clubbing, cyanosis, edema.  Radials/DP/PT 2+ and equal bilaterally.  Respiratory:  Respirations regular and unlabored, clear to auscultation bilaterally. GI: Soft, nontender, nondistended, BS + x 4. MS: no deformity or atrophy. Skin: warm and dry, no rash. Neuro:  Strength and sensation are intact. Psych: AAOx3.  Normal affect.  Labs    CBC  Recent Labs  01/04/16 0417 01/05/16 0223  WBC 6.6 7.3  HGB 9.7* 10.3*  HCT 29.8* 31.6*  MCV 92.5 93.8  PLT 427* 392   Basic Metabolic Panel  Recent Labs  01/04/16 0417 01/05/16 0223  NA 142 143  K 3.4* 3.6  CL 113* 111  CO2 22 24  GLUCOSE 86 123*  BUN 10 11  CREATININE 1.23* 1.17*  CALCIUM 8.4* 8.4*   Liver Function Tests No results for input(s): AST, ALT, ALKPHOS, BILITOT, PROT, ALBUMIN  in the last 72 hours. No results for input(s): LIPASE, AMYLASE in the last 72 hours. Cardiac Enzymes No results for input(s): CKTOTAL, CKMB, CKMBINDEX, TROPONINI in the last 72 hours. BNP Invalid input(s): POCBNP D-Dimer No results for input(s): DDIMER in the last 72 hours. Hemoglobin A1C No results for input(s): HGBA1C in the last 72 hours. Fasting Lipid Panel No results for input(s): CHOL, HDL, LDLCALC, TRIG, CHOLHDL, LDLDIRECT in the last 72 hours. Thyroid Function Tests No results for input(s): TSH, T4TOTAL, T3FREE, THYROIDAB in the last 72 hours.  Invalid input(s): FREET3  Telemetry    NSR - Personally Reviewed  ECG    NSR - Personally Reviewed  Radiology    No results found.  Cardiac Studies   2D echo Study Conclusions  - Left ventricle: The cavity size was normal. Wall thickness was   increased in a pattern of moderate LVH. Systolic function was   normal. The estimated ejection fraction was in the range of 60%   to 65%. Wall motion  was normal; there were no regional wall   motion abnormalities. Features are consistent with a pseudonormal   left ventricular filling pattern, with concomitant abnormal   relaxation and increased filling pressure (grade 2 diastolic   dysfunction). E/medial e&' > 15 suggests LV end diastolic pressure   at least 20 mmHg. - Aortic valve: Trileaflet; moderately calcified leaflets.   Sclerosis without stenosis. - Mitral valve: Moderately calcified annulus. Mildly calcified   leaflets . There was trivial regurgitation. - Left atrium: The atrium was mildly dilated. - Right ventricle: The cavity size was normal. Systolic function   was normal. - Pulmonary arteries: No complete TR doppler jet so unable to   estimate PA systolic pressure. - Inferior vena cava: The vessel was normal in size. The   respirophasic diameter changes were in the normal range (= 50%),   consistent with normal central venous pressure. - Pericardium, extracardiac: Small to moderate circumferential   pericardial effusion, no tamponade. Pleural effusion noted.  Impressions:  - Normal LV size with moderate LV hypertrophy. EF 60-65%. Moderate   diastolic dysfunction with evidence for elevated LV filling   pressure. Normal RV size and systolic function. Aortic sclerosis   without significant stenosis. Mild to moderate pericardial   effusion without tamponade.  Patient Profile     MableLindseyis a 83 y.o.female with history of mitral valve regurgitation, CAD with obstructive disease with 80% ostial LCx on medical treatment, chronic diastolic dysfunction last EF around 65%, paroxysmal atrial fibrillation not on anticoagulation due to fall risk and age, hypertension, dyslipidemia who lives at home and walks with a cane.  She was admitted with right toe gangrene with osteomyelitis and right toe distal phalanx pathological fracture.  Started on IV Abx/ found to have Staph lugdenesis bacteremia from 12/25 Sent to [facility] from  [facility] on 12/31 for vascular surgical eval   Assessment & Plan    1.  ASCAD -  Activty level prior to hospitalization approx 3-4 METS. She presents with gangrene of R gr toe.  No evid for CHF.  Overall, patient is at moderate increased risk for major cardiac event with vascular surgery  I do not think further cardiac testing would change therapy.   Would continue telemetry with close control of BP/HR throughout.  2D echo showed normal LVF. Continue ASA/statin.  2.  Diastolic dysfunction - noted on echo - no evidence of volume overload on exam post op.   Continue diuretic as needed for volume overload.  3.  Mild to moderate pericardial effusion - this was noted on echo in 03/2015 and was mild then.  No evidence of tamponade and patient is asymptomatic. This will need to be followed up outpt.    4.  Paroxysmal atrial fibrillation - maintaining NSR with no PAF.  Continue Amio.  Not on anticoagulation - will defer to her primary Cardiologist Dr. [name]  No new recs - will sign off.  Call with any questions.    Signed, Armanda Magicraci Turner, MD  01/06/2016, 9:03 AM

## 2016-01-06 NOTE — Care Management Note (Signed)
Case Management Note Previous CM note initiated by [name], Deborah [name], RN-01/05/2016, 2:22 PM   Patient Details  Name: [name] MRN: [id number] Date of Birth: 07/29/1920  Subjective/Objective:   Patient with osteo in toe, scheduled for toe amputation today, and previously  [facility],  NCM [facility] , to make sure they have patient, Lupita LeashDonna states she will put as referral. NCM will cont to follow for dc needs.                  Action/Plan: tx from 3S on 01/05/16- per PT eval today 1/4- recommendations for STSNF- CSW has been consulted for possible SNF placement- CM will continue to follow. [facility].   Expected Discharge Date:                  Expected Discharge Plan:  Skilled Nursing Facility  In-House Referral:  Clinical Social Work  Discharge planning Services  CM Consult  Post Acute Care Choice:  Home Health Choice offered to:  Patient, Adult Children  DME Arranged:    DME Agency:     HH Arranged:  RN, IV Antibiotics HH Agency:  [facility]  Status of Service:  Completed, signed off  If discussed at Long Length of Stay Meetings, dates discussed:    Additional Comments:  Darrold SpanWebster, Caylea Foronda Hall, RN 01/06/2016, 2:44 PM [phone]

## 2016-01-06 NOTE — Progress Notes (Addendum)
[facility]  Subjective  - Doing well no complaints of pain.   Objective (!) 156/50 69 98.3 F (36.8 C) (Oral) 18 97%  Intake/Output Summary (Last 24 hours) at 01/06/16 0756 Last data filed at 01/06/16 0100  Gross per 24 hour  Intake              700 ml  Output              100 ml  Net              600 ml    Right foot dressing clean and dry  Left groin soft Heart RRR Lungs non labored breathing  Assessment/Planning: POD # 1 right great toe amputation POD#2 angiogram high-grade stenosis within the tibioperoneal trunk and proximal peroneal artery successfully treated with atherectomy using a CSI 1.25 micro device and a 3 mm coyote balloon Plan to remove dressing tomorrow Heel weight bearing hard sole shoe Plan for SNF verse HH has good family support  Dressing changed incision is viable, clean dry dressing placed over foot.  [name] GallantCOLLINS, Keyri Salberg [facility]MAUREEN 01/06/2016 7:56 AM --  Laboratory Lab Results:  Recent Labs  01/04/16 0417 01/05/16 0223  WBC 6.6 7.3  HGB 9.7* 10.3*  HCT 29.8* 31.6*  PLT 427* 392   BMET  Recent Labs  01/04/16 0417 01/05/16 0223  NA 142 143  K 3.4* 3.6  CL 113* 111  CO2 22 24  GLUCOSE 86 123*  BUN 10 11  CREATININE 1.23* 1.17*  CALCIUM 8.4* 8.4*    COAG Lab Results  Component Value Date   INR 1.07 01/05/2016   INR 1.18 01/04/2016   INR 1.15 12/27/2015   No results found for: PTT

## 2016-01-06 NOTE — Progress Notes (Signed)
Peripherally Inserted Central Catheter/Midline Placement  The IV Nurse has discussed with the patient and/or persons authorized to consent for the patient, the purpose of this procedure and the potential benefits and risks involved with this procedure.  The benefits include less needle sticks, lab draws from the catheter, and the patient may be discharged home with the catheter. Risks include, but not limited to, infection, bleeding, blood clot (thrombus formation), and puncture of an artery; nerve damage and irregular heartbeat and possibility to perform a PICC exchange if needed/ordered by physician.  Alternatives to this procedure were also discussed.  Bard Power PICC patient education guide, fact sheet on infection prevention and patient information card has been provided to patient /or left at bedside.    PICC/Midline Placement Documentation        Timmothy Soursewman, Karna Abed Renee 01/06/2016, 11:59 AM

## 2016-01-06 NOTE — Clinical Social Work Note (Signed)
Clinical Social Work Assessment  Patient Details  Name: [name] MRN: [id number] Date of Birth: 1920/05/09  Date of referral:  01/06/16               Reason for consult:  Discharge Planning                Permission sought to share information with:  Family Supports Permission granted to share information::  Yes, Verbal Permission Granted  Name::     Jesly Hartmann  Agency::     Relationship::  son  Contact Information:  [phone]  Housing/Transportation Living arrangements for the past 2 months:  Apartment Source of Information:  Patient Patient Interpreter Needed:  None Criminal Activity/Legal Involvement Pertinent to Current Situation/Hospitalization:  No - Comment as needed Significant Relationships:  Adult Children Lives with:  Self Do you feel safe going back to the place where you live?  No Need for family participation in patient care:  No (Coment)  Care giving concerns:  Patient has support from adult children   Social Worker assessment / plan:  Clinical Social Worker met patient at bedside to offer support and discuss patients needs at discharge. Patient stated she is agreeable to go to a SNF and [facility] CSW to complete necessary paperwork and initiate SNF search on patient behalf. CSW to follow up with patient once bed offers are available. CSW remains available for support and to facilitate patient discharge needs once medically stable.   Employment status:  Retired Forensic scientist:  Medicare PT Recommendations:  [place] / Referral to community resources:  [place]  Patient/Family's Response to care:  Patient verbalized appreciation and understanding for CSW role and involvement in care. Patient agreeable with current discharge plan to SNF  Patient/Family's Understanding of and Emotional Response to Diagnosis, Current Treatment, and Prognosis:  Patient with good understanding  of current medical state and limitations around most recent hospitalization. Patient is agreeable with SNF placement in hopes of transitioning to a more stable living environment    Emotional Assessment Appearance:  Appears stated age Attitude/Demeanor/Rapport:  Other Affect (typically observed):  Accepting, Calm Orientation:  Oriented to Self, Oriented to Place, Oriented to  Time, Oriented to Situation Alcohol / Substance use:  Not Applicable Psych involvement (Current and /or in the community):  No (Comment)  Discharge Needs  Concerns to be addressed:  No discharge needs identified Readmission within the last 30 days:  No Current discharge risk:  None Barriers to Discharge:  No Barriers Identified   Rhea Pink, MSW,  [place]

## 2016-01-07 ENCOUNTER — Inpatient Hospital Stay
Admission: RE | Admit: 2016-01-07 | Discharge: 2016-01-17 | Disposition: A | Discharge status: Home / Self Care | Payer: Medicare Other | Source: Ambulatory Visit | Attending: Internal Medicine | Admitting: Internal Medicine

## 2016-01-07 LAB — CULTURE, BLOOD (ROUTINE X 2)
Culture: NO GROWTH
Culture: NO GROWTH

## 2016-01-07 MED ORDER — CEFAZOLIN IV (FOR PTA / DISCHARGE USE ONLY): 2.0000 g | Freq: Two times a day (BID) | INTRAVENOUS | 0 refills | Status: AC

## 2016-01-07 MED ORDER — HEPARIN SOD (PORK) LOCK FLUSH 100 UNIT/ML IV SOLN
250.0000 [IU] | INTRAVENOUS | Status: AC | PRN
  Administered 2016-01-07: 250 [IU]

## 2016-01-07 NOTE — Care Management Note (Signed)
Case Management Note Previous CM note initiated by [name], Deborah [name], RN-01/05/2016, 2:22 PM   Patient Details  Name: [name] MRN: [id number] Date of Birth: 09/26/1920  Subjective/Objective:   Patient with osteo in toe, scheduled for toe amputation today, and previously  [facility],  NCM contacted Lupita [facility]Donna with [facility] , to make sure they have patient, Lupita LeashDonna states she will put as referral. NCM will cont to follow for dc needs.                  Action/Plan: tx from 3S on 01/05/16- per PT eval today 1/4- recommendations for STSNF- CSW has been consulted for possible SNF placement- CM will continue to follow. [facility].   Expected Discharge Date:    01/07/16              Expected Discharge Plan:  Skilled Nursing Facility  In-House Referral:  Clinical Social Work  Discharge planning Services  CM Consult  Post Acute Care Choice:  Home Health Choice offered to:  Patient, Adult Children  DME Arranged:    DME Agency:     HH Arranged:  RN, IV Antibiotics HH Agency:  [facility]  Status of Service:  Completed, signed off  If discussed at Long Length of Stay Meetings, dates discussed:    Discharge Disposition: skilled facility   Additional Comments:  01/07/16- 1030- Muad Noga RN, CM- pt for d/c today- plan for SNF- CSW following for placement needs- [facility]- [facility] will not be needed at this time-SNF discharge planned.   Zenda AlpersWebster, [place] OlivetKristi Hall, RN 01/07/2016, 10:28 AM [phone]

## 2016-01-07 NOTE — Progress Notes (Signed)
Report called to Randie Heinzhonda Lane, [facility].

## 2016-01-07 NOTE — Progress Notes (Signed)
Clinical Social Worker facilitated patient discharge including contacting patient family and facility to confirm patient discharge plans.  Clinical information faxed to facility and family agreeable with plan.  CS[facility]CSW received authorization through insurance, number [phone].  RN Marchelle Folksmanda to call [phone] for report prior to discharge.  Clinical Social Worker will sign off for now as social work intervention is no longer needed. Please consult us again if new need arises.  Marrianne MoodAshley Rayon Mcchristian, MSW, [employer]CSWA [phone]

## 2016-01-07 NOTE — Discharge Summary (Addendum)
PATIENT DETAILS Name: [name] Age: 83 y.o. Sex: female Date of Birth: 09-May-1920 MRN: [id number]. Admitting Physician: Thurnell Lose, MD RFF:[id number], Betsy Coder, MD  Admit Date: 12/27/2015 Discharge date: 01/07/2016  Recommendations for Outpatient Follow-up:  1. Follow up with PCP in 1-2 weeks 2. Please see below (med list) for weekly labs 3. Please ensure patient is seen at the ID clinic prior to completing antibiotics 4. Ensure follow up with vascular surgery  Admitted From:  Home  Disposition: SNF    Home Health: No  Equipment/Devices: None  Discharge Condition: Stable  CODE STATUS: DNR  Diet recommendation:  Heart Healthy / Carb Modified / Regular / Dysphagia 1/2/3 with full aspiration precautions  Brief Summary: See H&P, Labs, Consult and Test reports for all details in brief, Patient is a 83 y.o. female with history of CAD, chronic diastolic heart failure, paroxysmal atrial fibrillation not on anticoagulation who presented with right toe gangrene. She was found to have Staph lugdenesis bacteremia on 12/25. She was empirically started on IV antibiotics, vascular surgery was consulted and underwent angioplasty of the right peroneal artery and right tibioperoneal trunk on 1/2. Underwent right great toe amputation on 1/3. See below for further details  Brief Hospital Course: Right great toe gangrene with peripheral vascular disease: Seen by [facility]-underwent angioplasty of the right peroneal artery and right tibioperoneal trunk on 1/2.Subsequently underwent right great toe amputation on 1/3. Ambulated with PT-plans are for SNF on discharge.  Staph lugdenensis bacteremia: Likely secondary to above, blood cultures on 12/25 positive for staph lugdenensis. Repeat Blood cultures on 12/31 negative. Dr. Broadus John spoke with Dr. Baxter Flattery on 12/31-and recommendations were to puruse echo, and plan for at least 4 weeks of IV Ancef.  TTE with no obvious vegetation-doubt any  benefit pursuing TEE on this 83 year old patient will change management outcome. Spoke with Dr Campbell-ID-today-recommends atleast 3-4 weeks of Ancef from 01/02/16(neg cultures). Agree's that TEE not indicated in this situation. PICC line placed on 1/4-please remove once patient completes IV Abx.  CAD: No chest pain or shortness of breath. Seen by cardiology preoperatively-no further workup required prior to proceeding with amputation. Continue aspirin and statin.   Mild to moderate pericardial effusion: Seems to be a chronic issue-has seen on prior echo on 03/23/68. No tamponade pathophysiology.   Paroxysmal atrial fibrillation: Continue amiodarone, not on anticoagulation likely secondary to advanced age and risk of falls.    Chronic diastolic dysfunction (EF 99-35% on TTE 01/04/16): Clinically compensated  Hypothyroidism: Continue levothyroxine  Dyslipidemia: Continue statin  GERD: Continue PPI   CKD Stage 3: creatinine close to usual baseline.   Procedures/Studies: Echo 1/2>>Normal LV size with moderate LV hypertrophy. EF 60-65%. Moderatediastolic dysfunction with evidence for elevated LV fillingpressure. Normal RV size and systolic function. Aortic sclerosiswithout significant stenosis. Mild to moderate pericardialeffusion without tamponade.  1/2>>angioplasty of the right peroneal artery and right tibioperoneal trunk   1/3>> right great toe amputation   Discharge Diagnoses:  Principal Problem:   Osteomyelitis of right foot ([place]) Active Problems:   CAD (coronary artery disease)   HTN (hypertension)   Hyperlipidemia   Heart murmur   Gangrene of toe of right foot (H[facility])   Acute osteomyelitis of toe, left ([place])   Bacteremia   Discharge Instructions:  Activity:  As tolerated with Full fall precautions use walker/cane & assistance as needed  Discharge Instructions    Call MD for:  redness, tenderness, or signs of infection (pain, swelling, redness, odor or  green/yellow discharge around  incision site)    Complete by:  As directed    Diet - low sodium heart healthy    Complete by:  As directed    Discharge wound care:    Complete by:  As directed    Dry dressing.  Change dressing daily   Home infusion instructions [facility] May follow [place] Dosing Protocol; May administer Cathflo as needed to maintain patency of vascular access device.; Flushing of vascular access device: per [facility] Protocol: 0.9% NaCl pre/post medica...    Complete by:  As directed    Instructions:  May follow [place] Dosing Protocol   Instructions:  May administer Cathflo as needed to maintain patency of vascular access device.   Instructions:  Flushing of vascular access device: per [facility] Protocol: 0.9% NaCl pre/post medication administration and prn patency; Heparin 100 u/ml, 18m for implanted ports and Heparin 10u/ml, 538mfor all other central venous catheters.   Instructions:  May follow [facility] Anaphylaxis Protocol for First Dose Administration in the home: 0.9% NaCl at 25-50 ml/hr to maintain IV access for protocol meds. Epinephrine 0.3 ml IV/IM PRN and Benadryl 25-50 IV/IM PRN s/s of anaphylaxis.   Instructions:  [facility] Castlenfusion Coordinator (RN) to assist per patient IV care needs in the home PRN.   Increase activity slowly    Complete by:  As directed      Allergies as of 01/07/2016   No Known Allergies     Medication List    TAKE these medications   amiodarone 200 MG tablet Commonly known as:  PACERONE Take 0.5 tablets (100 mg total) by mouth daily.   amLODipine 2.5 MG tablet Commonly known as:  NORVASC Take 2.5 mg by mouth daily.   aspirin EC 81 MG tablet Take 1 tablet (81 mg total) by mouth daily.   atorvastatin 20 MG tablet Commonly known as:  LIPITOR Take 1 tablet (20 mg total) by mouth every other day. Take one tablet by mouth once daily.   ceFAZolin IVPB Commonly known as:  ANCEF Inject 2 g into the vein every 12 (twelve)  hours. Indication:  bacteremia Last Day of Therapy:  01/28/16 Labs - Once weekly:  CBC/D and BMP, Labs - Every other week:  ESR and CRP   furosemide 20 MG tablet Commonly known as:  LASIX TAKE ONE TABLET BY MOUTH EVERY DAY AS NEEDED FOR SWELLING.   levothyroxine 75 MCG tablet Commonly known as:  SYNTHROID, LEVOTHROID Take 75 mcg by mouth daily.   omeprazole 20 MG capsule Commonly known as:  PRILOSEC Take 40 mg by mouth daily.   vitamin B-12 1000 MCG tablet Commonly known as:  CYANOCOBALAMIN Take 1,000 mcg by mouth daily.            Home Infusion Instuctions        Start     Ordered   01/07/16 0000  Home infusion instructions [facility] May follow [facility] Protocol; May administer Cathflo as needed to maintain patency of vascular access device.; Flushing of vascular access device[facility]: 0.9% NaCl pre/post medica...    Question Answer Comment  Instructions May follow [facility] Protocol   Instructions May administer Cathflo as needed to maintain patency of vascular access device.   Instructions Flushing of vascular access device: [facility]: 0.9% NaCl pre/post medication administration and prn patency; Heparin 100 u/ml, 73m13mor implanted ports and Heparin 10u/ml, 73ml34mr all other central venous catheters.   Instructions  May follow [facility] Anaphylaxis Protocol for First Dose Administration in the home: 0.9% NaCl at 25-50 ml/hr to maintain IV access for protocol meds. Epinephrine 0.3 ml IV/IM PRN and Benadryl 25-50 IV/IM PRN s/s of anaphylaxis.   Instructions [facility] Infusion Coordinator (RN) to assist per patient IV care needs in the home PRN.      01/07/16 0755     Follow-up Information    Durene Cal, MD Follow up in 3 week(s).   Specialties:  Vascular Surgery, Cardiology Why:  office will call Contact information: [address] [place] [address] [phone]        Joni Reining, NP Follow up on 01/17/2016.    Specialties:  Nurse Practitioner, Radiology, Cardiology Why:  At 1:50 pm Contact information: [address] [place] [address] [phone]        Colette Ribas, MD. Schedule an appointment as soon as possible for a visit in 2 week(s).   Specialty:  Family Medicine Contact information: [address] [place] [address] [phone]          No Known Allergies   Consultations:   cardiology and vascular surgery  Other Procedures/Studies: US Arterial Seg Single  Result Date: 12/28/2015 CLINICAL DATA:  Nonhealing right great toe wound, gangrene, osteomyelitis EXAM: NONINVASIVE PHYSIOLOGIC VASCULAR STUDY OF BILATERAL LOWER EXTREMITIES TECHNIQUE: Evaluation of both lower extremities were performed at rest, including calculation of ankle-brachial indices with single level Doppler, pressure and pulse volume recording. COMPARISON:  None available FINDINGS: Right ABI:  0.58 Left ABI:  0.86 Right Lower Extremity: Irregular monophasic right posterior tibial waveform. Biphasic right dorsalis pedis waveform. Markedly decreased right ABI measuring 0.58 indicative of significant or moderate range right lower extremity peripheral vascular disease. Left Lower Extremity: Biphasic left posterior tibial and dorsalis pedis waveforms. Mildly decreased ABI measuring 0.86. Suspect nonocclusive left lower extremity vascular disease. IMPRESSION: Right ABI 0.58 indicative of moderate range right lower extremity peripheral vascular disease. Minor decrease in the left ABI as above. Electronically Signed   By: Judie Petit.  Shick M.D.   On: 12/28/2015 12:21   [place] Lower Ext Art Right  Result Date: 12/28/2015 CLINICAL DATA:  Right great toe gangrene and osteomyelitis. EXAM: UNILATERAL RIGHT LOWER EXTREMITY ARTERIAL DUPLEX SCAN TECHNIQUE: Gray-scale sonography as well as color Doppler and duplex ultrasound was performed to evaluate the arteries of the lower extremity. COMPARISON:  ABI examination  12/28/2015 FINDINGS: Atherosclerotic calcifications in the right common femoral artery with a monophasic waveform. Deep femoral artery is patent with monophasic waveform. Calcifications and plaque in the right SFA. Monophasic waveforms throughout the right SFA. Peak systolic velocity in the proximal right SFA is 133 cm/sec and the peak systolic velocity in the mid SFA is 221 cm/sec. Atherosclerotic calcifications in the right popliteal artery with monophasic waveform. Monophasic waveform in the right peroneal artery. Monophasic waveform in the right posterior tibial artery. The distal right posterior tibial artery may be occluded. Monophasic waveform in the anterior tibial artery. Markedly elevated peak systolic velocity in the proximal anterior tibial artery measuring up to 496 cm/sec, this is compatible with stenosis. IMPRESSION: Diffuse atherosclerotic disease throughout the right lower extremity arteries. Right runoff disease demonstrated by stenosis in the right anterior tibial artery and occlusion or minimal flow in the distal posterior tibial artery. At least mild stenosis in the mid right SFA. Monophasic waveforms throughout the right lower extremity may signify underlying inflow disease. Electronically Signed   By: Richarda Overlie M.D.   On: 12/28/2015 14:41  Dg Chest Port 1 View  Result Date: 12/27/2015 CLINICAL DATA:  Cough.  Right great toe infection. EXAM: PORTABLE CHEST 1 VIEW COMPARISON:  PA and lateral chest 05/14/2009. Single-view of the chest 05/10/2009 and 04/25/2007. FINDINGS: There is cardiomegaly and vascular congestion. No consolidative process, pneumothorax or effusion. IMPRESSION: Cardiomegaly and pulmonary vascular congestion. Electronically Signed   By: Inge Rise M.D.   On: 12/27/2015 14:48   Dg Foot Complete Right  Result Date: 12/27/2015 CLINICAL DATA:  The patient tried her great toenail her of this week with onset of swelling, wound in bowel odor. The toe is discolored.  Initial encounter. EXAM: RIGHT FOOT COMPLETE - 3+ VIEW COMPARISON:  None. FINDINGS: Soft tissues of the great toe are markedly swollen. There is bony destructive change throughout the distal phalanx a comminuted fracture that is transverse in orientation through the metaphysis and extends the articular surface. Bones are osteopenic. No other acute bony abnormality is seen. IMPRESSION: Findings most consistent with osteomyelitis and pathologic fracture of the distal phalanx of the right great toe. Electronically Signed   By: Inge Rise M.D.   On: 12/27/2015 13:17   [place] Saphenous Vein Mapping Right  Result Date: 01/01/2016 CLINICAL DATA:  Right toe gangrene. Please evaluate patency and size of right greater saphenous vein for potential venous bypass. EXAM: RIGHT LOWER EXTREMITY VENOUS MAPPING. TECHNIQUE: Gray-scale sonography with graded compression, as well as color Doppler and duplex ultrasound were performed to evaluate the lower extremity deep venous systems from the level of the common femoral vein and including the common femoral, femoral, profunda femoral, popliteal and calf veins including the posterior tibial, peroneal and gastrocnemius veins when visible. The superficial great saphenous vein was also interrogated. Spectral Doppler was utilized to evaluate flow at rest and with distal augmentation maneuvers in the common femoral, femoral and popliteal veins. COMPARISON:  None. FINDINGS: The greater saphenous vein appears widely patent throughout its imaged course. The greater saphenous vein measures 0.5 cm proximally (image 5), approximately 0.5 cm at the level of the distal thigh (image 17), 0.4 cm at the level the proximal calf (image 21) and 0.3 cm at the level of the distal calf (image 29). The right common femoral vein  appears widely patent where imaged. IMPRESSION: Wide patency of the right greater saphenous vein with measurements as above. Electronically Signed   By: Sandi Mariscal M.D.   On:  01/01/2016 07:56     TODAY-DAY OF DISCHARGE:  Subjective:   [name] today has no headache,no chest abdominal pain,no new weakness tingling or numbness, feels much better wants to go home today.   Objective:   Blood pressure (!) 132/48, pulse (!) 47, temperature 97.5 F (36.4 C), temperature source Oral, resp. rate 18, height '5\' 5"'$  (1.651 m), weight 55.6 kg (122 lb 8 oz), SpO2 93 %.  Intake/Output Summary (Last 24 hours) at 01/07/16 0939 Last data filed at 01/06/16 2159  Gross per 24 hour  Intake              240 ml  Output                0 ml  Net              240 ml   Filed Weights   01/04/16 0623 01/05/16 0300 01/07/16 0345  Weight: 54.9 kg (121 lb) 54.8 kg (120 lb 13 oz) 55.6 kg (122 lb 8 oz)    Exam: Awake Alert, Oriented *3, No new F.N deficits,  Normal affect [place].AT,PERRAL Supple Neck,No JVD, No cervical lymphadenopathy appriciated.  Symmetrical Chest wall movement, Good air movement bilaterally, CTAB RRR,No Gallops,Rubs or new Murmurs, No Parasternal Heave +ve B.Sounds, Abd Soft, Non tender, No organomegaly appriciated, No rebound -guarding or rigidity. No Cyanosis, Clubbing or edema, No new Rash or bruise   PERTINENT RADIOLOGIC STUDIES: US Arterial Seg Single  Result Date: 12/28/2015 CLINICAL DATA:  Nonhealing right great toe wound, gangrene, osteomyelitis EXAM: NONINVASIVE PHYSIOLOGIC VASCULAR STUDY OF BILATERAL LOWER EXTREMITIES TECHNIQUE: Evaluation of both lower extremities were performed at rest, including calculation of ankle-brachial indices with single level Doppler, pressure and pulse volume recording. COMPARISON:  None available FINDINGS: Right ABI:  0.58 Left ABI:  0.86 Right Lower Extremity: Irregular monophasic right posterior tibial waveform. Biphasic right dorsalis pedis waveform. Markedly decreased right ABI measuring 0.58 indicative of significant or moderate range right lower extremity peripheral vascular disease. Left Lower Extremity: Biphasic  left posterior tibial and dorsalis pedis waveforms. Mildly decreased ABI measuring 0.86. Suspect nonocclusive left lower extremity vascular disease. IMPRESSION: Right ABI 0.58 indicative of moderate range right lower extremity peripheral vascular disease. Minor decrease in the left ABI as above. Electronically Signed   By: Jerilynn Mages.  Shick M.D.   On: 12/28/2015 12:21   [place] Lower Ext Art Right  Result Date: 12/28/2015 CLINICAL DATA:  Right great toe gangrene and osteomyelitis. EXAM: UNILATERAL RIGHT LOWER EXTREMITY ARTERIAL DUPLEX SCAN TECHNIQUE: Gray-scale sonography as well as color Doppler and duplex ultrasound was performed to evaluate the arteries of the lower extremity. COMPARISON:  ABI examination 12/28/2015 FINDINGS: Atherosclerotic calcifications in the right common femoral artery with a monophasic waveform. Deep femoral artery is patent with monophasic waveform. Calcifications and plaque in the right SFA. Monophasic waveforms throughout the right SFA. Peak systolic velocity in the proximal right SFA is 133 cm/sec and the peak systolic velocity in the mid SFA is 221 cm/sec. Atherosclerotic calcifications in the right popliteal artery with monophasic waveform. Monophasic waveform in the right peroneal artery. Monophasic waveform in the right posterior tibial artery. The distal right posterior tibial artery may be occluded. Monophasic waveform in the anterior tibial artery. Markedly elevated peak systolic velocity in the proximal anterior tibial artery measuring up to 496 cm/sec, this is compatible with stenosis. IMPRESSION: Diffuse atherosclerotic disease throughout the right lower extremity arteries. Right runoff disease demonstrated by stenosis in the right anterior tibial artery and occlusion or minimal flow in the distal posterior tibial artery. At least mild stenosis in the mid right SFA. Monophasic waveforms throughout the right lower extremity may signify underlying inflow disease. Electronically Signed    By: Markus Daft M.D.   On: 12/28/2015 14:41   Dg Chest Port 1 View  Result Date: 12/27/2015 CLINICAL DATA:  Cough.  Right great toe infection. EXAM: PORTABLE CHEST 1 VIEW COMPARISON:  PA and lateral chest 05/14/2009. Single-view of the chest 05/10/2009 and 04/25/2007. FINDINGS: There is cardiomegaly and vascular congestion. No consolidative process, pneumothorax or effusion. IMPRESSION: Cardiomegaly and pulmonary vascular congestion. Electronically Signed   By: Inge Rise M.D.   On: 12/27/2015 14:48   Dg Foot Complete Right  Result Date: 12/27/2015 CLINICAL DATA:  The patient tried her great toenail her of this week with onset of swelling, wound in bowel odor. The toe is discolored. Initial encounter. EXAM: RIGHT FOOT COMPLETE - 3+ VIEW COMPARISON:  None. FINDINGS: Soft tissues of the great toe are markedly swollen. There is bony destructive change throughout the distal phalanx a comminuted fracture that is transverse in  orientation through the metaphysis and extends the articular surface. Bones are osteopenic. No other acute bony abnormality is seen. IMPRESSION: Findings most consistent with osteomyelitis and pathologic fracture of the distal phalanx of the right great toe. Electronically Signed   By: Inge Rise M.D.   On: 12/27/2015 13:17   [place] Saphenous Vein Mapping Right  Result Date: 01/01/2016 CLINICAL DATA:  Right toe gangrene. Please evaluate patency and size of right greater saphenous vein for potential venous bypass. EXAM: RIGHT LOWER EXTREMITY VENOUS MAPPING. TECHNIQUE: Gray-scale sonography with graded compression, as well as color Doppler and duplex ultrasound were performed to evaluate the lower extremity deep venous systems from the level of the common femoral vein and including the common femoral, femoral, profunda femoral, popliteal and calf veins including the posterior tibial, peroneal and gastrocnemius veins when visible. The superficial great saphenous vein was also  interrogated. Spectral Doppler was utilized to evaluate flow at rest and with distal augmentation maneuvers in the common femoral, femoral and popliteal veins. COMPARISON:  None. FINDINGS: The greater saphenous vein appears widely patent throughout its imaged course. The greater saphenous vein measures 0.5 cm proximally (image 5), approximately 0.5 cm at the level of the distal thigh (image 17), 0.4 cm at the level the proximal calf (image 21) and 0.3 cm at the level of the distal calf (image 29). The right common femoral vein  appears widely patent where imaged. IMPRESSION: Wide patency of the right greater saphenous vein with measurements as above. Electronically Signed   By: Sandi Mariscal M.D.   On: 01/01/2016 07:56     PERTINENT LAB RESULTS: CBC:  Recent Labs  01/05/16 0223  WBC 7.3  HGB 10.3*  HCT 31.6*  PLT 392   CMET CMP     Component Value Date/Time   NA 143 01/05/2016 0223   K 3.6 01/05/2016 0223   CL 111 01/05/2016 0223   CO2 24 01/05/2016 0223   GLUCOSE 123 (H) 01/05/2016 0223   BUN 11 01/05/2016 0223   CREATININE 1.17 (H) 01/05/2016 0223   CALCIUM 8.4 (L) 01/05/2016 0223   PROT 6.9 05/10/2009 1412   ALBUMIN 3.5 05/10/2009 1412   AST 25 05/10/2009 1412   ALT 20 05/10/2009 1412   ALKPHOS 69 05/10/2009 1412   BILITOT 0.7 05/10/2009 1412   GFRNONAA 39 (L) 01/05/2016 0223   GFRAA 45 (L) 01/05/2016 0223    GFR Estimated Creatinine Clearance: 26.4 mL/min (by C-G formula based on SCr of 1.17 mg/dL (H)). No results for input(s): LIPASE, AMYLASE in the last 72 hours. No results for input(s): CKTOTAL, CKMB, CKMBINDEX, TROPONINI in the last 72 hours. Invalid input(s): POCBNP No results for input(s): DDIMER in the last 72 hours. No results for input(s): HGBA1C in the last 72 hours. No results for input(s): CHOL, HDL, LDLCALC, TRIG, CHOLHDL, LDLDIRECT in the last 72 hours. No results for input(s): TSH, T4TOTAL, T3FREE, THYROIDAB in the last 72 hours.  Invalid input(s):  FREET3 No results for input(s): VITAMINB12, FOLATE, FERRITIN, TIBC, IRON, RETICCTPCT in the last 72 hours. Coags:  Recent Labs  01/05/16 0223  INR 1.07   Microbiology: Recent Results (from the past 240 hour(s))  Surgical pcr screen     Status: None   Collection Time: 12/30/15  2:20 AM  Result Value Ref Range Status   MRSA, PCR NEGATIVE NEGATIVE Final   Staphylococcus aureus NEGATIVE NEGATIVE Final    Comment:        The Xpert SA Assay (FDA approved for NASAL specimens  in patients over 32 years of age), is one component of a comprehensive surveillance program.  Test performance has been validated by [facility] for patients greater than or equal to 29 year old. It is not intended to diagnose infection nor to guide or monitor treatment.   Culture, blood (routine x 2)     Status: None (Preliminary result)   Collection Time: 01/02/16 11:16 AM  Result Value Ref Range Status   Specimen Description BLOOD BLOOD RIGHT ARM  Final   Special Requests IN PEDIATRIC BOTTLE  Final   Culture NO GROWTH 4 DAYS  Final   Report Status PENDING  Incomplete  Culture, blood (routine x 2)     Status: None (Preliminary result)   Collection Time: 01/02/16 11:29 AM  Result Value Ref Range Status   Specimen Description BLOOD BLOOD LEFT HAND  Final   Special Requests IN PEDIATRIC BOTTLE  Final   Culture NO GROWTH 4 DAYS  Final   Report Status PENDING  Incomplete  Surgical pcr screen     Status: None   Collection Time: 01/03/16  3:55 PM  Result Value Ref Range Status   MRSA, PCR NEGATIVE NEGATIVE Final   Staphylococcus aureus NEGATIVE NEGATIVE Final    Comment:        The Xpert SA Assay (FDA approved for NASAL specimens in patients over 42 years of age), is one component of a comprehensive surveillance program.  Test performance has been validated by [facility] for patients greater than or equal to 22 year old. It is not intended to diagnose infection nor to guide or monitor  treatment.     FURTHER DISCHARGE INSTRUCTIONS:  Get Medicines reviewed and adjusted: Please take all your medications with you for your next visit with your Primary MD  Laboratory/radiological data: Please request your Primary MD to go over all hospital tests and procedure/radiological results at the follow up, please ask your Primary MD to get all Hospital records sent to his/her office.  In some cases, they will be blood work, cultures and biopsy results pending at the time of your discharge. Please request that your primary care M.D. goes through all the records of your hospital data and follows up on these results.  Also Note the following: If you experience worsening of your admission symptoms, develop shortness of breath, life threatening emergency, suicidal or homicidal thoughts you must seek medical attention immediately by calling 911 or calling your MD immediately  if symptoms less severe.  You must read complete instructions/literature along with all the possible adverse reactions/side effects for all the Medicines you take and that have been prescribed to you. Take any new Medicines after you have completely understood and accpet all the possible adverse reactions/side effects.   Do not drive when taking Pain medications or sleeping medications (Benzodaizepines)  Do not take more than prescribed Pain, Sleep and Anxiety Medications. It is not advisable to combine anxiety,sleep and pain medications without talking with your primary care practitioner  Special Instructions: If you have smoked or chewed Tobacco  in the last 2 yrs please stop smoking, stop any regular Alcohol  and or any Recreational drug use.  Wear Seat belts while driving.  Please note: You were cared for by a hospitalist during your hospital stay. Once you are discharged, your primary care physician will handle any further medical issues. Please note that NO REFILLS for any discharge medications will be  authorized once you are discharged, as it is imperative that  you return to your primary care physician (or establish a relationship with a primary care physician if you do not have one) for your post hospital discharge needs so that they can reassess your need for medications and monitor your lab values.  Total Time spent coordinating discharge including counseling, education and face to face time equals  45 minutes.  SignedOren Binet 01/07/2016 9:39 AM

## 2016-01-07 NOTE — Progress Notes (Signed)
Order to discharge received, pt has bed at [facility].  Telemetry removed, CCMD notified.  Last dose ABX running now.  IV team paged to flush and cap PICC line for D/C.  Pt R foot dressing changed.  Pt dressed and ready for PTAR, scheduled to arrive at 1300.

## 2016-01-07 NOTE — Progress Notes (Signed)
  Progress Note    01/07/2016 7:31 AM 2 Days Post-Op  Subjective:  Pain controlled.  Very thankful for helping her  Tm 99.2 now afebrile HR  40's-60's 130's systolic 93% RA  Vitals:   01/06/16 2109 01/07/16 0413  BP: (!) 131/45 (!) 132/48  Pulse: 60 (!) 47  Resp: 18 18  Temp: 99.2 F (37.3 C) 97.5 F (36.4 C)    Physical Exam: Lungs:  Non labored Incisions:  Clean; mild maceration; nylon sutures in place   CBC    Component Value Date/Time   WBC 7.3 01/05/2016 0223   RBC 3.37 (L) 01/05/2016 0223   HGB 10.3 (L) 01/05/2016 0223   HCT 31.6 (L) 01/05/2016 0223   PLT 392 01/05/2016 0223   MCV 93.8 01/05/2016 0223   MCH 30.6 01/05/2016 0223   MCHC 32.6 01/05/2016 0223   RDW 13.7 01/05/2016 0223   LYMPHSABS 1.0 12/27/2015 1225   MONOABS 1.0 12/27/2015 1225   EOSABS 0.0 12/27/2015 1225   BASOSABS 0.0 12/27/2015 1225    BMET    Component Value Date/Time   NA 143 01/05/2016 0223   K 3.6 01/05/2016 0223   CL 111 01/05/2016 0223   CO2 24 01/05/2016 0223   GLUCOSE 123 (H) 01/05/2016 0223   BUN 11 01/05/2016 0223   CREATININE 1.17 (H) 01/05/2016 0223   CALCIUM 8.4 (L) 01/05/2016 0223   GFRNONAA 39 (L) 01/05/2016 0223   GFRAA 45 (L) 01/05/2016 0223    INR    Component Value Date/Time   INR 1.07 01/05/2016 0223     Intake/Output Summary (Last 24 hours) at 01/07/16 0731 Last data filed at 01/06/16 2159  Gross per 24 hour  Intake              480 ml  Output                0 ml  Net              480 ml     Assessment:  83 y.o. female is s/p:  Left great toe amputation including metatarsal head  2 Days Post-Op  Plan: -pt doing well this am with good pain control -incision okay with mild maceration.  Re-wrapped without adaptic and just dry dressing.  Change dressing daily -heel weight bearing only -SNF  -f/u with Dr. Myra GianottiBrabham in 3 weeks   Doreatha MassedSamantha Hartley Urton, PA-C [facility] [phone] 01/07/2016 7:31 AM

## 2016-01-08 ENCOUNTER — Encounter (HOSPITAL_COMMUNITY)
Admission: RE | Admit: 2016-01-08 | Discharge: 2016-01-08 | Disposition: A | Discharge status: Home / Self Care | Payer: Medicare Other | Source: Other Acute Inpatient Hospital | Attending: Internal Medicine | Admitting: Internal Medicine

## 2016-01-08 DIAGNOSIS — M86171 Other acute osteomyelitis, right ankle and foot: Secondary | ICD-10-CM | POA: Insufficient documentation

## 2016-01-08 LAB — CBC WITH DIFFERENTIAL/PLATELET
Basophils Absolute: 0 10*3/uL (ref 0.0–0.1)
Basophils Relative: 0 %
Eosinophils Absolute: 0.1 10*3/uL (ref 0.0–0.7)
Eosinophils Relative: 2 %
HEMATOCRIT: 28.4 % — AB (ref 36.0–46.0)
HEMOGLOBIN: 9.3 g/dL — AB (ref 12.0–15.0)
LYMPHS ABS: 1.1 10*3/uL (ref 0.7–4.0)
Lymphocytes Relative: 20 %
MCH: 31.3 pg (ref 26.0–34.0)
MCHC: 32.7 g/dL (ref 30.0–36.0)
MCV: 95.6 fL (ref 78.0–100.0)
MONO ABS: 0.8 10*3/uL (ref 0.1–1.0)
MONOS PCT: 15 %
NEUTROS ABS: 3.4 10*3/uL (ref 1.7–7.7)
NEUTROS PCT: 63 %
Platelets: 348 10*3/uL (ref 150–400)
RBC: 2.97 MIL/uL — ABNORMAL LOW (ref 3.87–5.11)
RDW: 13.9 % (ref 11.5–15.5)
WBC: 5.5 10*3/uL (ref 4.0–10.5)

## 2016-01-08 LAB — BASIC METABOLIC PANEL
Anion gap: 7 (ref 5–15)
BUN: 16 mg/dL (ref 6–20)
CHLORIDE: 106 mmol/L (ref 101–111)
CO2: 24 mmol/L (ref 22–32)
CREATININE: 1.13 mg/dL — AB (ref 0.44–1.00)
Calcium: 8.4 mg/dL — ABNORMAL LOW (ref 8.9–10.3)
GFR calc Af Amer: 47 mL/min — ABNORMAL LOW (ref >60)
GFR calc non Af Amer: 41 mL/min — ABNORMAL LOW (ref >60)
GLUCOSE: 83 mg/dL (ref 65–99)
Potassium: 3.9 mmol/L (ref 3.5–5.1)
Sodium: 137 mmol/L (ref 135–145)

## 2016-01-08 LAB — SEDIMENTATION RATE: Sed Rate: 50 mm/hr — ABNORMAL HIGH (ref 0–22)

## 2016-01-08 LAB — C-REACTIVE PROTEIN: CRP: 6.3 mg/dL — AB (ref <1.0)

## 2016-01-10 ENCOUNTER — Non-Acute Institutional Stay (SKILLED_NURSING_FACILITY): Payer: Medicare Other | Admitting: Internal Medicine

## 2016-01-10 ENCOUNTER — Encounter: Payer: Self-pay | Admitting: Internal Medicine

## 2016-01-10 ENCOUNTER — Encounter (HOSPITAL_COMMUNITY)
Admission: RE | Admit: 2016-01-10 | Discharge: 2016-01-10 | Disposition: A | Discharge status: Home / Self Care | Payer: Medicare Other | Source: Skilled Nursing Facility | Attending: Internal Medicine | Admitting: Internal Medicine

## 2016-01-10 DIAGNOSIS — I2510 Atherosclerotic heart disease of native coronary artery without angina pectoris: Secondary | ICD-10-CM | POA: Diagnosis not present

## 2016-01-10 DIAGNOSIS — M8610 Other acute osteomyelitis, unspecified site: Secondary | ICD-10-CM | POA: Diagnosis not present

## 2016-01-10 DIAGNOSIS — R7881 Bacteremia: Secondary | ICD-10-CM | POA: Diagnosis not present

## 2016-01-10 DIAGNOSIS — I480 Paroxysmal atrial fibrillation: Secondary | ICD-10-CM | POA: Diagnosis not present

## 2016-01-10 DIAGNOSIS — D638 Anemia in other chronic diseases classified elsewhere: Secondary | ICD-10-CM

## 2016-01-10 DIAGNOSIS — I10 Essential (primary) hypertension: Secondary | ICD-10-CM

## 2016-01-10 LAB — CBC WITH DIFFERENTIAL/PLATELET
Basophils Absolute: 0 10*3/uL (ref 0.0–0.1)
Basophils Relative: 0 %
EOS ABS: 0.2 10*3/uL (ref 0.0–0.7)
EOS PCT: 3 %
HCT: 26.5 % — ABNORMAL LOW (ref 36.0–46.0)
HEMOGLOBIN: 8.5 g/dL — AB (ref 12.0–15.0)
LYMPHS ABS: 0.9 10*3/uL (ref 0.7–4.0)
Lymphocytes Relative: 16 %
MCH: 30.8 pg (ref 26.0–34.0)
MCHC: 32.1 g/dL (ref 30.0–36.0)
MCV: 96 fL (ref 78.0–100.0)
MONO ABS: 0.6 10*3/uL (ref 0.1–1.0)
Monocytes Relative: 12 %
Neutro Abs: 3.7 10*3/uL (ref 1.7–7.7)
Neutrophils Relative %: 69 %
Platelets: 351 10*3/uL (ref 150–400)
RBC: 2.76 MIL/uL — ABNORMAL LOW (ref 3.87–5.11)
RDW: 14.3 % (ref 11.5–15.5)
WBC: 5.4 10*3/uL (ref 4.0–10.5)

## 2016-01-10 LAB — BASIC METABOLIC PANEL
Anion gap: 5 (ref 5–15)
BUN: 20 mg/dL (ref 6–20)
CHLORIDE: 107 mmol/L (ref 101–111)
CO2: 27 mmol/L (ref 22–32)
CREATININE: 1.1 mg/dL — AB (ref 0.44–1.00)
Calcium: 8.2 mg/dL — ABNORMAL LOW (ref 8.9–10.3)
GFR calc Af Amer: 49 mL/min — ABNORMAL LOW (ref >60)
GFR calc non Af Amer: 42 mL/min — ABNORMAL LOW (ref >60)
Glucose, Bld: 90 mg/dL (ref 65–99)
Potassium: 3.8 mmol/L (ref 3.5–5.1)
SODIUM: 139 mmol/L (ref 135–145)

## 2016-01-10 NOTE — Progress Notes (Signed)
Provider:  Veleta Miners Location:   [place] Room Number: 778/E Place of Service:  SNF (31)  PCP: Purvis Kilts, MD Patient Care Team: Sharilyn Sites, MD as PCP - General (Family Medicine) Arnoldo Lenis, MD as Consulting Physician (Cardiology)  Extended Emergency Contact Information Primary Emergency Contact: [place] Phone: [phone] Mobile Phone: [phone] Relation: None Secondary Emergency Contact: Maurene Capes [place] Phone: [phone] Relation: None  Code Status: DNR Goals of Care: Advanced Directive information Advanced Directives 01/10/2016  Does Patient Have a Medical Advance Directive? Yes  Type of Advance Directive Out of facility DNR (pink MOST or yellow form)  Does patient want to make changes to medical advance directive? No - Patient declined  Copy of [place] in Chart? -      Chief Complaint  Patient presents with  . New Admit To SNF    HPI: Patient is a 83 y.o. female seen today for admission to SNF for therapy. Patient has H/O CAD with Cath done in 2008 on Medical management, Paroxysmal Atrial Fibrillation Not on any Anticoagulation, Hypertension, Hyperlipidemia,  Patient came to hospital with Right Toe gangrene and was found to have Staph Lugdenesis Bacteremia on 12/25. She Underwent Angioplasty of Right Peroneal Artery and Right Great Toe Amputation. Her TTE was negative for [facility] and TEE was not done. She had PICC line placed and Per Dr Megan Salon she is suppose to get Ancef for 4 weeks. Patient is doing well in SNF. Walking with the walker. She use to live by herself at home. Has son and Daughter who help to take care of with her medicines and Financials. She eventually wants to go home. She is not having any Fever, Chills, or any pain in Amputated site.  Past Medical History:  Diagnosis Date  . Acid reflux   . Carotid  artery occlusion    minimal  . Coronary artery disease   . Hypercholesteremia   . Hypertension   . Myocardial infarct   . PAF (paroxysmal atrial fibrillation) ([place])   . Skin disorder   . Systolic murmur   . Thyroid disease    Past Surgical History:  Procedure Laterality Date  . ABDOMINAL HYSTERECTOMY    . AMPUTATION Right 01/05/2016   Procedure: RIGHT GREAT TOE AMPUTATION;  Surgeon: Serafina Mitchell, MD;  Location: [place];  Service: Vascular;  Laterality: Right;  . CARDIAC CATHETERIZATION    . CARDIOVERSION  2012  . LOWER EXTREMITY ANGIOGRAM Right 12/30/2015   Procedure: RIGHT LOWER EXTREMITY ANGIOGRAM;  Surgeon: Vickie Epley, MD;  Location: [facility];  Service: Vascular;  Laterality: Right;  accessed through left groin area - dressed with two 2x2 and 4x4 opsite  . PERIPHERAL VASCULAR CATHETERIZATION N/A 01/04/2016   Procedure: Lower Extremity Angiography;  Surgeon: Serafina Mitchell, MD;  Location: [place] [facility];  Service: Cardiovascular;  Laterality: N/A;  . PERIPHERAL VASCULAR CATHETERIZATION Right 01/04/2016   Procedure: Peripheral Vascular Atherectomy;  Surgeon: Serafina Mitchell, MD;  Location: [place] [facility];  Service: Cardiovascular;  Laterality: Right;  Peroneal  . THYROID SURGERY      reports that she has never smoked. She has never used smokeless tobacco. She reports that she does not drink alcohol or use drugs. Social History   Social History  . Marital status: Widowed    Spouse name: N/A  . Number of children: N/A  . Years of education: N/A  Occupational History  . Not on file.   Social History Main Topics  . Smoking status: Never Smoker  . Smokeless tobacco: Never Used  . Alcohol use No  . Drug use: No  . Sexual activity: No   Other Topics Concern  . Not on file   Social History Narrative  . No narrative on file    Functional Status Survey:    Family History  Problem Relation Age of Onset  . Family history unknown: Yes    Health Maintenance    Topic Date Due  . TETANUS/TDAP  02/08/1939  . ZOSTAVAX  02/08/1982  . DEXA SCAN  02/09/1987  . INFLUENZA VACCINE  12/02/2016 (Originally 08/03/2015)  . PNA vac Low Risk Adult (1 of 2 - PCV13) 12/02/2016 (Originally 02/09/1987)    No Known Allergies  Allergies as of 01/10/2016   No Known Allergies     Medication List       Accurate as of 01/10/16  9:15 AM. Always use your most recent med list.          amiodarone 200 MG tablet Commonly known as:  PACERONE Take 0.5 tablets (100 mg total) by mouth daily.   amLODipine 2.5 MG tablet Commonly known as:  NORVASC Take 2.5 mg by mouth daily.   aspirin EC 81 MG tablet Take 1 tablet (81 mg total) by mouth daily.   atorvastatin 20 MG tablet Commonly known as:  LIPITOR Take 20 mg by mouth every other day.   ceFAZolin IVPB Commonly known as:  ANCEF Inject 2 g into the vein every 12 (twelve) hours. Indication:  bacteremia Last Day of Therapy:  01/28/16 Labs - Once weekly:  CBC/D and BMP, Labs - Every other week:  ESR and CRP   furosemide 20 MG tablet Commonly known as:  LASIX TAKE ONE TABLET BY MOUTH EVERY DAY AS NEEDED FOR SWELLING.   levothyroxine 75 MCG tablet Commonly known as:  SYNTHROID, LEVOTHROID Take 75 mcg by mouth daily.   omeprazole 20 MG capsule Commonly known as:  PRILOSEC Take 40 mg by mouth daily.   vitamin B-12 1000 MCG tablet Commonly known as:  CYANOCOBALAMIN Take 1,000 mcg by mouth daily.       Review of Systems  Constitutional: Negative for activity change, appetite change, chills, diaphoresis and fatigue.  HENT: Negative for rhinorrhea, sinus pain and sinus pressure.   Respiratory: Negative for apnea, cough, chest tightness and shortness of breath.   Cardiovascular: Negative for chest pain and palpitations.  Gastrointestinal: Negative for abdominal distention, abdominal pain, constipation, diarrhea and nausea.  Genitourinary: Negative for dysuria, frequency and urgency.  Musculoskeletal: Negative for  arthralgias, back pain, neck pain and neck stiffness.  Skin: Negative for color change, pallor and rash.  Neurological: Negative.   Psychiatric/Behavioral: Negative.     Vitals:   01/10/16 0904  BP: (!) 138/58  Pulse: (!) 59  Resp: 18  Temp: 98.4 F (36.9 C)  TempSrc: Oral   There is no height or weight on file to calculate BMI. Physical Exam  Constitutional: She is oriented to person, place, and time. She appears well-developed and well-nourished.  HENT:  Head: Normocephalic.  Mouth/Throat: Oropharynx is clear and moist.  Eyes: Pupils are equal, round, and reactive to light.  Neck: Neck supple.  Cardiovascular: Normal rate and normal heart sounds.   Pulmonary/Chest: Effort normal and breath sounds normal. No respiratory distress. She has no wheezes. She has no rales.  Abdominal: Soft. Bowel sounds are normal. She exhibits no  distension. There is no tenderness. There is no rebound.  Musculoskeletal: She exhibits no edema.  Lymphadenopathy:    She has no cervical adenopathy.  Neurological: She is alert and oriented to person, place, and time.  Moving all extremities well  Skin: Skin is warm and dry. No rash noted. No erythema.  Well haaling incision of Right Toe with some change in Skin color around the Incision.    Labs reviewed: Basic Metabolic Panel:  Recent Labs  01/05/16 0223 01/08/16 0622 01/10/16 0700  NA 143 137 139  K 3.6 3.9 3.8  CL 111 106 107  CO2 _0 GLUCOSE 123* 83 90  BUN _1 CREATININE 1.17* 1.13* 1.10*  CALCIUM 8.4* 8.4* 8.2*   Liver Function Tests: No results for input(s): AST, ALT, ALKPHOS, BILITOT, PROT, ALBUMIN in the last 8760 hours. No results for input(s): LIPASE, AMYLASE in the last 8760 hours. No results for input(s): AMMONIA in the last 8760 hours. CBC:  Recent Labs  12/27/15 1225  01/05/16 0223 01/08/16 0622 01/10/16 0700  WBC 10.3  < > 7.3 5.5 5.4  NEUTROABS 8.3*  --   --  3.4 3.7  HGB 11.5*  < > 10.3* 9.3* 8.5*   HCT 35.2*  < > 31.6* 28.4* 26.5*  MCV 97.2  < > 93.8 95.6 96.0  PLT 418*  < > 392 348 351  < > = values in this interval not displayed. Cardiac Enzymes: No results for input(s): CKTOTAL, CKMB, CKMBINDEX, TROPONINI in the last 8760 hours. BNP: Invalid input(s): POCBNP No results found for: HGBA1C  No results found for: VITAMINB12 No results found for: FOLATE No results found for: IRON, TIBC, FERRITIN  Imaging and Procedures obtained prior to SNF admission: No results found.  Assessment/Plan Acute osteomyelitis of toe S/P Amputation. Patient is doing well with Pain control and the icision is healing well. Patient is learning to walk with Therapist.  Bacteremia Secondary to Above. She is on Ancef for 4 weeks. Need follow up with Infectous disease.  Coronary artery disease  On medical Management. No signs of any chest pain  Essential hypertension BP stable  Paroxysmal atrial fibrillation (HCC) Rate controlled on Amiodarone. Is not on Any anticoagulants  Anemia HGB down to 8.5 Most likely due to chronic disease. Will Do Iron studies and follow HGB.  Hyperlipidemia On statin QOD as cannot tolerate every day.  Hypothyroid Check TSH Family/ staff Communication:   Labs/tests ordered: BMP, CBC with Diff. Iron Studies. TSH

## 2016-01-13 ENCOUNTER — Encounter (HOSPITAL_COMMUNITY)
Admission: RE | Admit: 2016-01-13 | Discharge: 2016-01-13 | Disposition: A | Discharge status: Home / Self Care | Payer: Medicare Other | Source: Skilled Nursing Facility | Attending: Internal Medicine | Admitting: Internal Medicine

## 2016-01-13 LAB — IRON AND TIBC
Iron: 23 ug/dL — ABNORMAL LOW (ref 28–170)
Saturation Ratios: 12 % (ref 10.4–31.8)
TIBC: 196 ug/dL — ABNORMAL LOW (ref 250–450)
UIBC: 173 ug/dL

## 2016-01-13 LAB — BASIC METABOLIC PANEL
Anion gap: 7 (ref 5–15)
BUN: 25 mg/dL — AB (ref 6–20)
CHLORIDE: 103 mmol/L (ref 101–111)
CO2: 27 mmol/L (ref 22–32)
CREATININE: 1.3 mg/dL — AB (ref 0.44–1.00)
Calcium: 8.4 mg/dL — ABNORMAL LOW (ref 8.9–10.3)
GFR, EST AFRICAN AMERICAN: 40 mL/min — AB (ref >60)
GFR, EST NON AFRICAN AMERICAN: 34 mL/min — AB (ref >60)
Glucose, Bld: 97 mg/dL (ref 65–99)
Potassium: 4.1 mmol/L (ref 3.5–5.1)
SODIUM: 137 mmol/L (ref 135–145)

## 2016-01-13 LAB — CBC
HCT: 29.3 % — ABNORMAL LOW (ref 36.0–46.0)
Hemoglobin: 9.7 g/dL — ABNORMAL LOW (ref 12.0–15.0)
MCH: 31.9 pg (ref 26.0–34.0)
MCHC: 33.1 g/dL (ref 30.0–36.0)
MCV: 96.4 fL (ref 78.0–100.0)
PLATELETS: 401 10*3/uL — AB (ref 150–400)
RBC: 3.04 MIL/uL — AB (ref 3.87–5.11)
RDW: 14.5 % (ref 11.5–15.5)
WBC: 5.4 10*3/uL (ref 4.0–10.5)

## 2016-01-13 LAB — FOLATE: FOLATE: 7.9 ng/mL (ref >5.9)

## 2016-01-13 LAB — VITAMIN B12: Vitamin B-12: 1633 pg/mL — ABNORMAL HIGH (ref 180–914)

## 2016-01-13 LAB — FERRITIN: FERRITIN: 128 ng/mL (ref 11–307)

## 2016-01-13 LAB — TSH: TSH: 3.131 u[IU]/mL (ref 0.350–4.500)

## 2016-01-17 ENCOUNTER — Emergency Department (HOSPITAL_COMMUNITY)
Admission: EM | Admit: 2016-01-17 | Discharge: 2016-01-17 | Disposition: A | Discharge status: Home / Self Care | Payer: Medicare Other | Attending: Emergency Medicine | Admitting: Emergency Medicine

## 2016-01-17 ENCOUNTER — Other Ambulatory Visit: Payer: Self-pay

## 2016-01-17 ENCOUNTER — Inpatient Hospital Stay
Admission: RE | Admit: 2016-01-17 | Discharge: 2016-01-21 | Disposition: A | Discharge status: Nursing Home (Includes ICF) | Payer: Medicare Other | Source: Ambulatory Visit | Attending: Internal Medicine | Admitting: Internal Medicine

## 2016-01-17 ENCOUNTER — Non-Acute Institutional Stay (SKILLED_NURSING_FACILITY): Payer: Medicare Other | Admitting: Internal Medicine

## 2016-01-17 ENCOUNTER — Ambulatory Visit (INDEPENDENT_AMBULATORY_CARE_PROVIDER_SITE_OTHER): Payer: Medicare Other | Admitting: Adult Health

## 2016-01-17 ENCOUNTER — Encounter: Payer: Self-pay | Admitting: Adult Health

## 2016-01-17 ENCOUNTER — Encounter: Payer: Self-pay | Admitting: Internal Medicine

## 2016-01-17 ENCOUNTER — Encounter (HOSPITAL_COMMUNITY): Payer: Self-pay | Admitting: Emergency Medicine

## 2016-01-17 VITALS — BP 194/70 | HR 71 | Ht 65.0 in | Wt 120.0 lb

## 2016-01-17 DIAGNOSIS — R7881 Bacteremia: Secondary | ICD-10-CM | POA: Diagnosis not present

## 2016-01-17 DIAGNOSIS — I252 Old myocardial infarction: Secondary | ICD-10-CM | POA: Insufficient documentation

## 2016-01-17 DIAGNOSIS — R238 Other skin changes: Secondary | ICD-10-CM

## 2016-01-17 DIAGNOSIS — R609 Edema, unspecified: Secondary | ICD-10-CM

## 2016-01-17 DIAGNOSIS — S90822A Blister (nonthermal), left foot, initial encounter: Secondary | ICD-10-CM

## 2016-01-17 DIAGNOSIS — I10 Essential (primary) hypertension: Secondary | ICD-10-CM | POA: Diagnosis not present

## 2016-01-17 DIAGNOSIS — I739 Peripheral vascular disease, unspecified: Secondary | ICD-10-CM | POA: Diagnosis not present

## 2016-01-17 DIAGNOSIS — Z7982 Long term (current) use of aspirin: Secondary | ICD-10-CM | POA: Diagnosis not present

## 2016-01-17 DIAGNOSIS — I2510 Atherosclerotic heart disease of native coronary artery without angina pectoris: Secondary | ICD-10-CM | POA: Insufficient documentation

## 2016-01-17 DIAGNOSIS — Z79899 Other long term (current) drug therapy: Secondary | ICD-10-CM | POA: Diagnosis not present

## 2016-01-17 DIAGNOSIS — M79605 Pain in left leg: Secondary | ICD-10-CM

## 2016-01-17 DIAGNOSIS — Z955 Presence of coronary angioplasty implant and graft: Secondary | ICD-10-CM | POA: Insufficient documentation

## 2016-01-17 DIAGNOSIS — M86171 Other acute osteomyelitis, right ankle and foot: Secondary | ICD-10-CM | POA: Diagnosis not present

## 2016-01-17 DIAGNOSIS — L0889 Other specified local infections of the skin and subcutaneous tissue: Secondary | ICD-10-CM | POA: Diagnosis present

## 2016-01-17 DIAGNOSIS — R600 Localized edema: Secondary | ICD-10-CM | POA: Diagnosis not present

## 2016-01-17 LAB — CBC WITH DIFFERENTIAL/PLATELET
Basophils Absolute: 0 10*3/uL (ref 0.0–0.1)
Basophils Relative: 1 %
EOS ABS: 0.2 10*3/uL (ref 0.0–0.7)
Eosinophils Relative: 3 %
HEMATOCRIT: 35.3 % — AB (ref 36.0–46.0)
HEMOGLOBIN: 11.3 g/dL — AB (ref 12.0–15.0)
LYMPHS ABS: 1.3 10*3/uL (ref 0.7–4.0)
LYMPHS PCT: 21 %
MCH: 30.5 pg (ref 26.0–34.0)
MCHC: 32 g/dL (ref 30.0–36.0)
MCV: 95.1 fL (ref 78.0–100.0)
Monocytes Absolute: 0.5 10*3/uL (ref 0.1–1.0)
Monocytes Relative: 8 %
NEUTROS ABS: 4.1 10*3/uL (ref 1.7–7.7)
NEUTROS PCT: 67 %
Platelets: 340 10*3/uL (ref 150–400)
RBC: 3.71 MIL/uL — AB (ref 3.87–5.11)
RDW: 14.4 % (ref 11.5–15.5)
WBC: 6.2 10*3/uL (ref 4.0–10.5)

## 2016-01-17 LAB — URINALYSIS, ROUTINE W REFLEX MICROSCOPIC
Bilirubin Urine: NEGATIVE
Glucose, UA: NEGATIVE mg/dL
Ketones, ur: NEGATIVE mg/dL
Nitrite: NEGATIVE
PROTEIN: NEGATIVE mg/dL
Specific Gravity, Urine: 1.005 (ref 1.005–1.030)
pH: 8 (ref 5.0–8.0)

## 2016-01-17 LAB — COMPREHENSIVE METABOLIC PANEL
ALBUMIN: 2.9 g/dL — AB (ref 3.5–5.0)
ALK PHOS: 95 U/L (ref 38–126)
AST: 22 U/L (ref 15–41)
Anion gap: 12 (ref 5–15)
BILIRUBIN TOTAL: 0.5 mg/dL (ref 0.3–1.2)
BUN: 18 mg/dL (ref 6–20)
CALCIUM: 9 mg/dL (ref 8.9–10.3)
CO2: 28 mmol/L (ref 22–32)
CREATININE: 1.2 mg/dL — AB (ref 0.44–1.00)
Chloride: 99 mmol/L — ABNORMAL LOW (ref 101–111)
GFR calc non Af Amer: 38 mL/min — ABNORMAL LOW (ref >60)
GFR, EST AFRICAN AMERICAN: 44 mL/min — AB (ref >60)
Glucose, Bld: 88 mg/dL (ref 65–99)
Potassium: 3.6 mmol/L (ref 3.5–5.1)
SODIUM: 139 mmol/L (ref 135–145)
Total Protein: 7 g/dL (ref 6.5–8.1)

## 2016-01-17 LAB — I-STAT CG4 LACTIC ACID, ED: Lactic Acid, Venous: 1.38 mmol/L (ref 0.5–1.9)

## 2016-01-17 MED ORDER — ENOXAPARIN SODIUM 60 MG/0.6ML ~~LOC~~ SOLN
1.0000 mg/kg | Freq: Once | SUBCUTANEOUS | Status: AC
  Administered 2016-01-17: 55 mg via SUBCUTANEOUS
  Filled 2016-01-17: qty 0.6

## 2016-01-17 MED ORDER — ENOXAPARIN SODIUM 40 MG/0.4ML ~~LOC~~ SOLN: 40.0000 mg | Freq: Once | SUBCUTANEOUS | Status: DC

## 2016-01-17 NOTE — Progress Notes (Signed)
Location:   [facility] Room Number: 540/G Place of Service:  SNF [phone]) Provider:  Leana Roe, MD  Patient Care Team: Sharilyn Sites, MD as PCP - General (Family Medicine) Arnoldo Lenis, MD as Consulting Physician (Cardiology)  Extended Emergency Contact Information Primary Emergency Contact: [place] Phone: [phone] Mobile Phone: [phone] Relation: None Secondary Emergency Contact: [place] Phone: [phone] Relation: None  Code Status:  DNR Goals of care: Advanced Directive information Advanced Directives 01/17/2016  Does Patient Have a Medical Advance Directive? Yes  Type of Advance Directive Out of facility DNR (pink MOST or yellow form)  Does patient want to make changes to medical advance directive? No - Patient declined  Copy of [place] in Chart? -     Chief Complaint  Patient presents with  . Acute Visit    Wound to feet    HPI:  Pt is a 83 y.o. female seen today for an acute visit for Follow-up of bilateral wounds on her feet. She is been in skilled nursing for rehabilitation as well as receiving IV antibiotic Ancef for 4 weeks for a history of osteomyelitis.  Agent presented initially to the hospital with right toe gangrene and was found to have staph Lugdenesis she underwent angioplasty of the right peroneal artery and right great toe amputation.  Her TTE was negative for any vegetation in TEE was not done.  She did have a PICC line placed and is supposed to get Ancef for approximately 3 additional weeks.  Apparently over the last couple days she has had some deterioration of her wounds-there is increased erythema on the right distal foot-there has also been blister development on her second third and fourth toes on the left as well as a heel blister on the left  She currently is afebrile-denies any fever or  chills she is she feels relatively well-  Of note she did see cardiology today as well-they have expressed concern as well but the new left heel ulcer-.  In regards to coronary artery disease there is recommendation to repeat her echocardiogram to look for changes in left ventricular function causing dyspnea.  In regards to hypertension and they didn't note it was somewhat elevated as well today recommendation was continue amiodarone Norvasc and Lasix daily and they will recheck blood pressure in a month if still elevated they will increase the Norvasc-meanwhile they want to await echo results  .       Past Medical History:  Diagnosis Date  . Acid reflux   . Carotid artery occlusion    minimal  . Coronary artery disease   . Hypercholesteremia   . Hypertension   . Myocardial infarct   . PAF (paroxysmal atrial fibrillation) ([place])   . Skin disorder   . Systolic murmur   . Thyroid disease    Past Surgical History:  Procedure Laterality Date  . ABDOMINAL HYSTERECTOMY    . AMPUTATION Right 01/05/2016   Procedure: RIGHT GREAT TOE AMPUTATION;  Surgeon: Serafina Mitchell, MD;  Location: [facility];  Service: Vascular;  Laterality: Right;  . CARDIAC CATHETERIZATION    . CARDIOVERSION  2012  . LOWER EXTREMITY ANGIOGRAM Right 12/30/2015   Procedure: RIGHT LOWER EXTREMITY ANGIOGRAM;  Surgeon: Vickie Epley, MD;  Location: [facility];  Service: Vascular;  Laterality: Right;  accessed through left groin area - dressed with two 2x2 and 4x4 opsite  .  PERIPHERAL VASCULAR CATHETERIZATION N/A 01/04/2016   Procedure: Lower Extremity Angiography;  Surgeon: Serafina Mitchell, MD;  Location: [facility];  Service: Cardiovascular;  Laterality: N/A;  . PERIPHERAL VASCULAR CATHETERIZATION Right 01/04/2016   Procedure: Peripheral Vascular Atherectomy;  Surgeon: Serafina Mitchell, MD;  Location: [facility];  Service: Cardiovascular;  Laterality: Right;  Peroneal  . THYROID SURGERY      No Known  Allergies  Current Outpatient Prescriptions on File Prior to Visit  Medication Sig Dispense Refill  . amiodarone (PACERONE) 200 MG tablet Take 0.5 tablets (100 mg total) by mouth daily. 15 tablet 8  . amLODipine (NORVASC) 2.5 MG tablet Take 2.5 mg by mouth daily.  5  . aspirin EC 81 MG tablet Take 1 tablet (81 mg total) by mouth daily. 90 tablet 3  . atorvastatin (LIPITOR) 20 MG tablet Take 20 mg by mouth every other day.    Marland Kitchen ceFAZolin (ANCEF) IVPB Inject 2 g into the vein every 12 (twelve) hours. Indication:  bacteremia Last Day of Therapy:  01/28/16 Labs - Once weekly:  CBC/D and BMP, Labs - Every other week:  ESR and CRP 44 Units 0  . furosemide (LASIX) 20 MG tablet TAKE ONE TABLET BY MOUTH EVERY DAY AS NEEDED FOR SWELLING. 30 tablet 3  . levothyroxine (SYNTHROID, LEVOTHROID) 75 MCG tablet Take 75 mcg by mouth daily.      Marland Kitchen omeprazole (PRILOSEC) 20 MG capsule Take 40 mg by mouth daily.     . vitamin B-12 (CYANOCOBALAMIN) 1000 MCG tablet Take 1,000 mcg by mouth daily.     No current facility-administered medications on file prior to visit.      Review of Systems In general she is denying any fever or chills or diaphoresis.  Skin does not complain of rashes or itching does have foot issues as noted above.  Head ears eyes nose mouth and throat does not complaining of any sore throat visual changes.  Cardiac does not complaining of chest pain or palpitations does have some left foot edema.  Respiratory is not complaining of shortness breath or cough.  GI is not complaining of any nausea vomiting diarrhea or abdominal discomfort.  Musculoskeletal is not really complaining of significant pain.  Neurologic is not complaining of dizziness or syncope  Psyche--does not complain of anxiety or depressive mood  There is no immunization history on file for this patient. Pertinent  Health Maintenance Due  Topic Date Due  . DEXA SCAN  02/09/1987  . INFLUENZA VACCINE  12/02/2016  (Originally 08/03/2015)  . PNA vac Low Risk Adult (1 of 2 - PCV13) 12/02/2016 (Originally 02/09/1987)   No flowsheet data found. Functional Status Survey:    Vitals:   01/17/16 1552  BP: (!) 152/70  Pulse: 75  Resp: (!) 24  Temp: 97.6 F (36.4 C)  TempSrc: Oral  SpO2: 100%    Physical Exam C Pleasant elderly lady sitting comfortably in her wheelchair no sign of distress HENT:  Head: Normocephalic.  Mouth/Throat: Oropharynx is clear and moist.  Eyes: Pupils are equal, round, and reactive to light.   Cardiovascular: Normal rate and normal heart sounds.   Pulmonary/Chest: Effort normal and breath sounds normal. No respiratory distress. She has no wheezes. She has no rales.  Abdominal: Soft. Bowel sounds are normal. She exhibits no distension. There is no tenderness. There is no rebound.  Musculoskeletal: Appears to have some edema of her left foot    Neurological: She is grossly alert and  oriented pleasant and appropriatr Moving all extremities well  Skin: Skin is warm and dry. No rash noted (She has what appears to be new development of blisters on her second third and fourth toes on the left as well as a blister on the left heel with some surrounding erythema.  She is status post right great toe amputation there also appears to be some erythema on the distal aspect of the right foot which is new per discussion with wound care nurse.       Labs reviewed:  Recent Labs  01/08/16 0622 01/10/16 0700 01/13/16 1047  NA 137 139 137  K 3.9 3.8 4.1  CL 106 107 103  CO2 '24 27 27  '$ GLUCOSE 83 90 97  BUN 16 20 25*  CREATININE 1.13* 1.10* 1.30*  CALCIUM 8.4* 8.2* 8.4*   No results for input(s): AST, ALT, ALKPHOS, BILITOT, PROT, ALBUMIN in the last 8760 hours.  Recent Labs  12/27/15 1225  01/08/16 0622 01/10/16 0700 01/13/16 1047  WBC 10.3  < > 5.5 5.4 5.4  NEUTROABS 8.3*  --  3.4 3.7  --   HGB 11.5*  < > 9.3* 8.5* 9.7*  HCT 35.2*  < > 28.4* 26.5* 29.3*  MCV 97.2   < > 95.6 96.0 96.4  PLT 418*  < > 348 351 401*  < > = values in this interval not displayed. Lab Results  Component Value Date   TSH 3.131 01/13/2016   No results found for: HGBA1C No results found for: CHOL, HDL, LDLCALC, LDLDIRECT, TRIG, CHOLHDL  Significant Diagnostic Results in last 30 days:  US Arterial Seg Single  Result Date: 12/28/2015 CLINICAL DATA:  Nonhealing right great toe wound, gangrene, osteomyelitis EXAM: NONINVASIVE PHYSIOLOGIC VASCULAR STUDY OF BILATERAL LOWER EXTREMITIES TECHNIQUE: Evaluation of both lower extremities were performed at rest, including calculation of ankle-brachial indices with single level Doppler, pressure and pulse volume recording. COMPARISON:  None available FINDINGS: Right ABI:  0.58 Left ABI:  0.86 Right Lower Extremity: Irregular monophasic right posterior tibial waveform. Biphasic right dorsalis pedis waveform. Markedly decreased right ABI measuring 0.58 indicative of significant or moderate range right lower extremity peripheral vascular disease. Left Lower Extremity: Biphasic left posterior tibial and dorsalis pedis waveforms. Mildly decreased ABI measuring 0.86. Suspect nonocclusive left lower extremity vascular disease. IMPRESSION: Right ABI 0.58 indicative of moderate range right lower extremity peripheral vascular disease. Minor decrease in the left ABI as above. Electronically Signed   By: Jerilynn Mages.  Shick M.D.   On: 12/28/2015 12:21   [place] Lower Ext Art Right  Result Date: 12/28/2015 CLINICAL DATA:  Right great toe gangrene and osteomyelitis. EXAM: UNILATERAL RIGHT LOWER EXTREMITY ARTERIAL DUPLEX SCAN TECHNIQUE: Gray-scale sonography as well as color Doppler and duplex ultrasound was performed to evaluate the arteries of the lower extremity. COMPARISON:  ABI examination 12/28/2015 FINDINGS: Atherosclerotic calcifications in the right common femoral artery with a monophasic waveform. Deep femoral artery is patent with monophasic waveform. Calcifications  and plaque in the right SFA. Monophasic waveforms throughout the right SFA. Peak systolic velocity in the proximal right SFA is 133 cm/sec and the peak systolic velocity in the mid SFA is 221 cm/sec. Atherosclerotic calcifications in the right popliteal artery with monophasic waveform. Monophasic waveform in the right peroneal artery. Monophasic waveform in the right posterior tibial artery. The distal right posterior tibial artery may be occluded. Monophasic waveform in the anterior tibial artery. Markedly elevated peak systolic velocity in the proximal anterior tibial artery measuring up to 496 cm/sec,  this is compatible with stenosis. IMPRESSION: Diffuse atherosclerotic disease throughout the right lower extremity arteries. Right runoff disease demonstrated by stenosis in the right anterior tibial artery and occlusion or minimal flow in the distal posterior tibial artery. At least mild stenosis in the mid right SFA. Monophasic waveforms throughout the right lower extremity may signify underlying inflow disease. Electronically Signed   By: Markus Daft M.D.   On: 12/28/2015 14:41   Dg Chest Port 1 View  Result Date: 12/27/2015 CLINICAL DATA:  Cough.  Right great toe infection. EXAM: PORTABLE CHEST 1 VIEW COMPARISON:  PA and lateral chest 05/14/2009. Single-view of the chest 05/10/2009 and 04/25/2007. FINDINGS: There is cardiomegaly and vascular congestion. No consolidative process, pneumothorax or effusion. IMPRESSION: Cardiomegaly and pulmonary vascular congestion. Electronically Signed   By: Inge Rise M.D.   On: 12/27/2015 14:48   Dg Foot Complete Right  Result Date: 12/27/2015 CLINICAL DATA:  The patient tried her great toenail her of this week with onset of swelling, wound in bowel odor. The toe is discolored. Initial encounter. EXAM: RIGHT FOOT COMPLETE - 3+ VIEW COMPARISON:  None. FINDINGS: Soft tissues of the great toe are markedly swollen. There is bony destructive change throughout the  distal phalanx a comminuted fracture that is transverse in orientation through the metaphysis and extends the articular surface. Bones are osteopenic. No other acute bony abnormality is seen. IMPRESSION: Findings most consistent with osteomyelitis and pathologic fracture of the distal phalanx of the right great toe. Electronically Signed   By: Inge Rise M.D.   On: 12/27/2015 13:17   [place] Saphenous Vein Mapping Right  Result Date: 01/01/2016 CLINICAL DATA:  Right toe gangrene. Please evaluate patency and size of right greater saphenous vein for potential venous bypass. EXAM: RIGHT LOWER EXTREMITY VENOUS MAPPING. TECHNIQUE: Gray-scale sonography with graded compression, as well as color Doppler and duplex ultrasound were performed to evaluate the lower extremity deep venous systems from the level of the common femoral vein and including the common femoral, femoral, profunda femoral, popliteal and calf veins including the posterior tibial, peroneal and gastrocnemius veins when visible. The superficial great saphenous vein was also interrogated. Spectral Doppler was utilized to evaluate flow at rest and with distal augmentation maneuvers in the common femoral, femoral and popliteal veins. COMPARISON:  None. FINDINGS: The greater saphenous vein appears widely patent throughout its imaged course. The greater saphenous vein measures 0.5 cm proximally (image 5), approximately 0.5 cm at the level of the distal thigh (image 17), 0.4 cm at the level the proximal calf (image 21) and 0.3 cm at the level of the distal calf (image 29). The right common femoral vein  appears widely patent where imaged. IMPRESSION: Wide patency of the right greater saphenous vein with measurements as above. Electronically Signed   By: Sandi Mariscal M.D.   On: 01/01/2016 07:56    Assessment/Plan  History of new blisters on left foot as well as erythema on the right foot--this is concerning especially in light that patient is already on  antibiotic treatment.  Nursing has discussed this with vascular office and recommendation is to send her to the ER for expedient evaluation.  -At this point she does not appear to be septic but certainly is at risk here and will await ER and vascular as well as ID recommendation--.    [place], [place], [place]

## 2016-01-17 NOTE — ED Provider Notes (Signed)
I saw and evaluated the patient, reviewed the resident's note and I agree with the findings and plan with the following exceptions.   83 year old female here secondary to bilateral lower show any swelling. Her left foot on my exam is also much: Than right. She has decreased cap refill greater than 8 seconds on the left. No palpable pulse so we will get a Doppler and attempt to find a pulse. Plan for vascular workup here in the emergency department if no pulses dopplerable we'll consult vascular surgery.  Can't get images tonight, will get as an outpatient tomorrow at [facility]. Have discussed there and they will inform [facility] and schedule time. Son ok with plan.    Marily MemosJason Kolbie Lepkowski, MD 01/18/16 [phone]

## 2016-01-17 NOTE — Progress Notes (Signed)
Name: [name]    DOB: 06/26/1920  Age: 83 y.o.  MR#: [id number]       PCP:  Colette RibasGOLDING, JOHN CABOT, MD      Insurance: Payor: [employer]ICARE / Plan: BCBS MEDICARE / Product Type: *No Product type* /   CC:    Chief Complaint  Patient presents with  . Coronary Artery Disease  . Atrial Fibrillation    PAF  . PAD    VS Vitals:   01/17/16 1408  Weight: 120 lb (54.4 kg)  Height: 5\' 5"  (1.651 m)    Weights Current Weight  01/17/16 120 lb (54.4 kg)  01/07/16 122 lb 8 oz (55.6 kg)  09/21/15 107 lb (48.5 kg)    Blood Pressure  BP Readings from Last 3 Encounters:  01/10/16 (!) 138/58  01/07/16 (!) 167/60  09/21/15 138/60     Admit date:  (Not on file) Last encounter with RMR:  Visit date not found   Allergy Patient has no known allergies.  No current outpatient prescriptions on file.   No current facility-administered medications for this visit.     Discontinued Meds:   There are no discontinued medications.  Patient Active Problem List   Diagnosis Date Noted  . Bacteremia   . Osteomyelitis of right foot (HCC) 12/27/2015  . Gangrene of toe of right foot (HCC) 12/27/2015  . Gangrene of foot (HCC)   . Toe infection   . CAD (coronary artery disease) 03/26/2013  . HTN (hypertension) 03/26/2013  . Hyperlipidemia 03/26/2013  . Heart murmur 03/26/2013    LABS    Component Value Date/Time   NA 137 01/13/2016 1047   NA 139 01/10/2016 0700   NA 137 01/08/2016 0622   K 4.1 01/13/2016 1047   K 3.8 01/10/2016 0700   K 3.9 01/08/2016 0622   CL 103 01/13/2016 1047   CL 107 01/10/2016 0700   CL 106 01/08/2016 0622   CO2 27 01/13/2016 1047   CO2 27 01/10/2016 0700   CO2 24 01/08/2016 0622   GLUCOSE 97 01/13/2016 1047   GLUCOSE 90 01/10/2016 0700   GLUCOSE 83 01/08/2016 0622   BUN 25 (H) 01/13/2016 1047   BUN 20 01/10/2016 0700   BUN 16 01/08/2016 0622   CREATININE 1.30 (H) 01/13/2016 1047   CREATININE 1.10 (H) 01/10/2016 0700   CREATININE 1.13  (H) 01/08/2016 0622   CALCIUM 8.4 (L) 01/13/2016 1047   CALCIUM 8.2 (L) 01/10/2016 0700   CALCIUM 8.4 (L) 01/08/2016 0622   GFRNONAA 34 (L) 01/13/2016 1047   GFRNONAA 42 (L) 01/10/2016 0700   GFRNONAA 41 (L) 01/08/2016 0622   GFRAA 40 (L) 01/13/2016 1047   GFRAA 49 (L) 01/10/2016 0700   GFRAA 47 (L) 01/08/2016 0622   CMP     Component Value Date/Time   NA 137 01/13/2016 1047   K 4.1 01/13/2016 1047   CL 103 01/13/2016 1047   CO2 27 01/13/2016 1047   GLUCOSE 97 01/13/2016 1047   BUN 25 (H) 01/13/2016 1047   CREATININE 1.30 (H) 01/13/2016 1047   CALCIUM 8.4 (L) 01/13/2016 1047   PROT 6.9 05/10/2009 1412   ALBUMIN 3.5 05/10/2009 1412   AST 25 05/10/2009 1412   ALT 20 05/10/2009 1412   ALKPHOS 69 05/10/2009 1412   BILITOT 0.7 05/10/2009 1412   GFRNONAA 34 (L) 01/13/2016 1047   GFRAA 40 (L) 01/13/2016 1047       Component Value Date/Time   WBC 5.4 01/13/2016 1047  WBC 5.4 01/10/2016 0700   WBC 5.5 01/08/2016 0622   HGB 9.7 (L) 01/13/2016 1047   HGB 8.5 (L) 01/10/2016 0700   HGB 9.3 (L) 01/08/2016 0622   HCT 29.3 (L) 01/13/2016 1047   HCT 26.5 (L) 01/10/2016 0700   HCT 28.4 (L) 01/08/2016 0622   MCV 96.4 01/13/2016 1047   MCV 96.0 01/10/2016 0700   MCV 95.6 01/08/2016 0622    Lipid Panel  No results found for: CHOL, TRIG, HDL, CHOLHDL, VLDL, LDLCALC, LDLDIRECT  ABG No results found for: PHART, PCO2ART, PO2ART, HCO3, TCO2, ACIDBASEDEF, O2SAT   Lab Results  Component Value Date   TSH 3.131 01/13/2016   BNP (last 3 results)  Recent Labs  12/27/15 1226  BNP 270.0*    ProBNP (last 3 results) No results for input(s): PROBNP in the last 8760 hours.  Cardiac Panel (last 3 results) No results for input(s): CKTOTAL, CKMB, TROPONINI, RELINDX in the last 72 hours.  Iron/TIBC/Ferritin/ %Sat    Component Value Date/Time   IRON 23 (L) 01/13/2016 0500   TIBC 196 (L) 01/13/2016 0500   FERRITIN 128 01/13/2016 0500   IRONPCTSAT 12 01/13/2016 0500     EKG Orders  placed or performed during the hospital encounter of 12/27/15  . EKG 12-Lead  . EKG 12-Lead  . EKG     Prior Assessment and Plan Problem List as of 01/17/2016 Reviewed: 01/10/2016 10:33 PM by Mahlon Gammon, MD     Cardiovascular and Mediastinum   CAD (coronary artery disease)   HTN (hypertension)     Musculoskeletal and Integument   Osteomyelitis of right foot (HCC)     Other   Hyperlipidemia   Heart murmur   Gangrene of foot (HCC)   Toe infection   Gangrene of toe of right foot (HCC)   Bacteremia       Imaging: US Arterial Seg Single  Result Date: 12/28/2015 CLINICAL DATA:  Nonhealing right great toe wound, gangrene, osteomyelitis EXAM: NONINVASIVE PHYSIOLOGIC VASCULAR STUDY OF BILATERAL LOWER EXTREMITIES TECHNIQUE: Evaluation of both lower extremities were performed at rest, including calculation of ankle-brachial indices with single level Doppler, pressure and pulse volume recording. COMPARISON:  None available FINDINGS: Right ABI:  0.58 Left ABI:  0.86 Right Lower Extremity: Irregular monophasic right posterior tibial waveform. Biphasic right dorsalis pedis waveform. Markedly decreased right ABI measuring 0.58 indicative of significant or moderate range right lower extremity peripheral vascular disease. Left Lower Extremity: Biphasic left posterior tibial and dorsalis pedis waveforms. Mildly decreased ABI measuring 0.86. Suspect nonocclusive left lower extremity vascular disease. IMPRESSION: Right ABI 0.58 indicative of moderate range right lower extremity peripheral vascular disease. Minor decrease in the left ABI as above. Electronically Signed   By: Judie Petit.  Shick M.D.   On: 12/28/2015 12:21   [place] Lower Ext Art Right  Result Date: 12/28/2015 CLINICAL DATA:  Right great toe gangrene and osteomyelitis. EXAM: UNILATERAL RIGHT LOWER EXTREMITY ARTERIAL DUPLEX SCAN TECHNIQUE: Gray-scale sonography as well as color Doppler and duplex ultrasound was performed to evaluate the arteries of the  lower extremity. COMPARISON:  ABI examination 12/28/2015 FINDINGS: Atherosclerotic calcifications in the right common femoral artery with a monophasic waveform. Deep femoral artery is patent with monophasic waveform. Calcifications and plaque in the right SFA. Monophasic waveforms throughout the right SFA. Peak systolic velocity in the proximal right SFA is 133 cm/sec and the peak systolic velocity in the mid SFA is 221 cm/sec. Atherosclerotic calcifications in the right popliteal artery with monophasic waveform. Monophasic waveform in  the right peroneal artery. Monophasic waveform in the right posterior tibial artery. The distal right posterior tibial artery may be occluded. Monophasic waveform in the anterior tibial artery. Markedly elevated peak systolic velocity in the proximal anterior tibial artery measuring up to 496 cm/sec, this is compatible with stenosis. IMPRESSION: Diffuse atherosclerotic disease throughout the right lower extremity arteries. Right runoff disease demonstrated by stenosis in the right anterior tibial artery and occlusion or minimal flow in the distal posterior tibial artery. At least mild stenosis in the mid right SFA. Monophasic waveforms throughout the right lower extremity may signify underlying inflow disease. Electronically Signed   By: Richarda Overlie M.D.   On: 12/28/2015 14:41   Dg Chest Port 1 View  Result Date: 12/27/2015 CLINICAL DATA:  Cough.  Right great toe infection. EXAM: PORTABLE CHEST 1 VIEW COMPARISON:  PA and lateral chest 05/14/2009. Single-view of the chest 05/10/2009 and 04/25/2007. FINDINGS: There is cardiomegaly and vascular congestion. No consolidative process, pneumothorax or effusion. IMPRESSION: Cardiomegaly and pulmonary vascular congestion. Electronically Signed   By: Drusilla Kanner M.D.   On: 12/27/2015 14:48   Dg Foot Complete Right  Result Date: 12/27/2015 CLINICAL DATA:  The patient tried her great toenail her of this week with onset of swelling,  wound in bowel odor. The toe is discolored. Initial encounter. EXAM: RIGHT FOOT COMPLETE - 3+ VIEW COMPARISON:  None. FINDINGS: Soft tissues of the great toe are markedly swollen. There is bony destructive change throughout the distal phalanx a comminuted fracture that is transverse in orientation through the metaphysis and extends the articular surface. Bones are osteopenic. No other acute bony abnormality is seen. IMPRESSION: Findings most consistent with osteomyelitis and pathologic fracture of the distal phalanx of the right great toe. Electronically Signed   By: Drusilla Kanner M.D.   On: 12/27/2015 13:17   [place] Saphenous Vein Mapping Right  Result Date: 01/01/2016 CLINICAL DATA:  Right toe gangrene. Please evaluate patency and size of right greater saphenous vein for potential venous bypass. EXAM: RIGHT LOWER EXTREMITY VENOUS MAPPING. TECHNIQUE: Gray-scale sonography with graded compression, as well as color Doppler and duplex ultrasound were performed to evaluate the lower extremity deep venous systems from the level of the common femoral vein and including the common femoral, femoral, profunda femoral, popliteal and calf veins including the posterior tibial, peroneal and gastrocnemius veins when visible. The superficial great saphenous vein was also interrogated. Spectral Doppler was utilized to evaluate flow at rest and with distal augmentation maneuvers in the common femoral, femoral and popliteal veins. COMPARISON:  None. FINDINGS: The greater saphenous vein appears widely patent throughout its imaged course. The greater saphenous vein measures 0.5 cm proximally (image 5), approximately 0.5 cm at the level of the distal thigh (image 17), 0.4 cm at the level the proximal calf (image 21) and 0.3 cm at the level of the distal calf (image 29). The right common femoral vein  appears widely patent where imaged. IMPRESSION: Wide patency of the right greater saphenous vein with measurements as above.  Electronically Signed   By: Simonne Come M.D.   On: 01/01/2016 07:56

## 2016-01-17 NOTE — Patient Instructions (Signed)
Your physician recommends that you schedule a follow-up appointment in: 1 Month  Your physician recommends that you continue on your current medications as directed. Please refer to the Current Medication list given to you today.  You have been referred to Wound Care ( Left Heel)  Patient to wear Heel Protector to Lt heel.  If you need a refill on your cardiac medications before your next appointment, please call your pharmacy.  Thank you for choosing [facility]!

## 2016-01-17 NOTE — ED Triage Notes (Addendum)
Surgery 2 weeks ago rt foot had toes removed  and it is draining  And left heel has a blister came up and her  Left  Foot is turing dark and the place that sent her here thinks she is losing the circulation in the left foot,  Toes are cool to both feet and has dressings on them , she can wiggle toes and has only fair blanch more than 3 sec

## 2016-01-17 NOTE — ED Provider Notes (Signed)
[facility] DEPT Provider Note  CSN: [id number] Arrival date & time: 01/17/16  1619  History   Chief Complaint Chief Complaint  Patient presents with  . Foot Injury  . Wound Infection   HPI Any [name] is a 83 y.o. female.  The history is provided by the patient. No language interpreter was used.  Illness  This is a new problem. The current episode started more than 1 week ago. The problem occurs constantly. The problem has been gradually worsening. Pertinent negatives include no chest pain, no abdominal pain, no headaches and no shortness of breath. The symptoms are aggravated by walking. The symptoms are relieved by rest.    Past Medical History:  Diagnosis Date  . Acid reflux   . Carotid artery occlusion    minimal  . Coronary artery disease   . Hypercholesteremia   . Hypertension   . Myocardial infarct   . PAF (paroxysmal atrial fibrillation) ([place])   . Skin disorder   . Systolic murmur   . Thyroid disease    Patient Active Problem List   Diagnosis Date Noted  . Bacteremia   . Osteomyelitis of right foot ([name]) 12/27/2015  . Gangrene of toe of right foot ([name]) 12/27/2015  . Gangrene of foot ([name])   . Toe infection   . CAD (coronary artery disease) 03/26/2013  . HTN (hypertension) 03/26/2013  . Hyperlipidemia 03/26/2013  . Heart murmur 03/26/2013   Past Surgical History:  Procedure Laterality Date  . ABDOMINAL HYSTERECTOMY    . AMPUTATION Right 01/05/2016   Procedure: RIGHT GREAT TOE AMPUTATION;  Surgeon: Serafina Mitchell, MD;  Location: [facility];  Service: Vascular;  Laterality: Right;  . CARDIAC CATHETERIZATION    . CARDIOVERSION  2012  . LOWER EXTREMITY ANGIOGRAM Right 12/30/2015   Procedure: RIGHT LOWER EXTREMITY ANGIOGRAM;  Surgeon: Vickie Epley, MD;  Location: [facility];  Service: Vascular;  Laterality: Right;  accessed through left groin area - dressed with two 2x2 and 4x4 opsite  . PERIPHERAL VASCULAR CATHETERIZATION N/A 01/04/2016   Procedure: Lower  Extremity Angiography;  Surgeon: Serafina Mitchell, MD;  Location: [facility];  Service: Cardiovascular;  Laterality: N/A;  . PERIPHERAL VASCULAR CATHETERIZATION Right 01/04/2016   Procedure: Peripheral Vascular Atherectomy;  Surgeon: Serafina Mitchell, MD;  Location: [facility] [facility];  Service: Cardiovascular;  Laterality: Right;  Peroneal  . THYROID SURGERY     OB History    No data available      Home Medications    Prior to Admission medications   Medication Sig Start Date End Date Taking? Authorizing Provider  amiodarone (PACERONE) 200 MG tablet Take 0.5 tablets (100 mg total) by mouth daily. 08/08/12  Yes Terance Ice, MD  amLODipine (NORVASC) 2.5 MG tablet Take 2.5 mg by mouth daily. 08/26/14  Yes Historical Provider, MD  aspirin EC 81 MG tablet Take 1 tablet (81 mg total) by mouth daily. 03/26/13  Yes Arnoldo Lenis, MD  atorvastatin (LIPITOR) 20 MG tablet Take 20 mg by mouth See admin instructions. Take 1 tablet (20 mg) by mouth every other night   Yes Historical Provider, MD  ceFAZolin (ANCEF) IVPB Inject 2 g into the vein every 12 (twelve) hours. Indication:  bacteremia Last Day of Therapy:  01/28/16 Labs - Once weekly:  CBC/D and BMP, Labs - Every other week:  ESR and CRP 01/07/16 01/29/16 Yes Shanker Kristeen Mans, MD  furosemide (LASIX) 20 MG tablet TAKE ONE TABLET BY MOUTH EVERY DAY AS  NEEDED FOR SWELLING. 12/22/15  Yes Arnoldo Lenis, MD  levothyroxine (SYNTHROID, LEVOTHROID) 75 MCG tablet Take 75 mcg by mouth daily.     Yes Historical Provider, MD  omeprazole (PRILOSEC) 40 MG capsule Take 40 mg by mouth at bedtime.   Yes Historical Provider, MD  Sodium Phosphates (FLEET ENEMA RE) Place 1 enema rectally once as needed (constipation).   Yes Historical Provider, MD  vitamin B-12 (CYANOCOBALAMIN) 1000 MCG tablet Take 1,000 mcg by mouth daily.   Yes Historical Provider, MD   Family History Family History  Problem Relation Age of Onset  . Family history unknown: Yes    Social History Social History  Substance Use Topics  . Smoking status: Never Smoker  . Smokeless tobacco: Never Used  . Alcohol use No    Allergies   Patient has no known allergies.  Review of Systems Review of Systems  Respiratory: Negative for shortness of breath.   Cardiovascular: Positive for leg swelling. Negative for chest pain.  Gastrointestinal: Negative for abdominal pain.  Skin: Positive for wound.  Neurological: Negative for headaches.  All other systems reviewed and are negative.   Physical Exam Updated Vital Signs BP 158/66   Pulse 67   Temp 97.7 F (36.5 C) (Oral)   Resp 16   SpO2 99%   Physical Exam  Constitutional: She is oriented to person, place, and time. No distress.  Elderly thin American female  HENT:  Head: Normocephalic and atraumatic.  Eyes: EOM are normal. Pupils are equal, round, and reactive to light.  Neck: Normal range of motion. Neck supple.  Cardiovascular: Normal rate, regular rhythm and normal heart sounds.   Pulmonary/Chest: Effort normal and breath sounds normal.  Abdominal: Soft. Bowel sounds are normal. She exhibits no distension. There is no tenderness.  Musculoskeletal: Normal range of motion. She exhibits edema.  2+ edema to bilateral lower extremities to level proximal shin, dopplerable pulses to bilateral DPs and PT's  Neurological: She is alert and oriented to person, place, and time.  Skin: Skin is dry. Capillary refill takes 2 to 3 seconds. She is not diaphoretic. No erythema.  Status post amputation of right great toe with sutures in place, both feet without overlying erythema or induration or warmth or drainage or fluctuance  Nursing note and vitals reviewed.   ED Treatments / Results  Labs (all labs ordered are listed, but only abnormal results are displayed) Labs Reviewed  CBC WITH DIFFERENTIAL/PLATELET - Abnormal; Notable for the following:       Result Value   RBC 3.71 (*)    Hemoglobin 11.3 (*)    HCT  35.3 (*)    All other components within normal limits  COMPREHENSIVE METABOLIC PANEL - Abnormal; Notable for the following:    Chloride 99 (*)    Creatinine, Ser 1.20 (*)    Albumin 2.9 (*)    ALT <5 (*)    GFR calc non Af Amer 38 (*)    GFR calc Af Amer 44 (*)    All other components within normal limits  URINALYSIS, ROUTINE W REFLEX MICROSCOPIC - Abnormal; Notable for the following:    APPearance HAZY (*)    Hgb urine dipstick SMALL (*)    Leukocytes, UA SMALL (*)    Bacteria, UA RARE (*)    Squamous Epithelial / LPF 0-5 (*)    All other components within normal limits  I-STAT CG4 LACTIC ACID, ED   EKG  EKG Interpretation None  Radiology No results found.  Procedures Procedures (including critical care time)  Medications Ordered in ED Medications  enoxaparin (LOVENOX) injection 55 mg (55 mg Subcutaneous Given 01/17/16 2224)    Initial Impression / Assessment and Plan / ED Course  I have reviewed the triage vital signs and the nursing notes.  Pertinent labs & imaging results that were available during my care of the patient were reviewed by me and considered in my medical decision making (see chart for details).  Clinical Course   83 y.o. female with above stated PMHx, HPI, and physical. PMHx of CAD, HTN, HLD, & A-fib. Toe injury x1 month ago. Now s/p amputations. Bacteremia in hospital now on Ancef via PICC and at [facility]. Denies fever, chills, N/V, redness, or warmth. Swelling of both legs over past month. Has not been as ambulatory 2/2 surgery. Denies CP, SOB, or palpitations.  No tachycardia or hypoxia of tachypnea - doubt PE. No evidence of cellulitis or abscess. Pulses bilaterally. No tech for vascular studies here this evening. Pt given treatment dose Lovenox to cover for DVT. Called [facility] and talked with imaging who acknowledged patient for scheduled tomorrow. Pt will be able to get DVT & arterial studies tomorrow morning. Both of these ordered. CBC  without leukocytosis or acute anemia. CMP notable for creatinine of 1.2 (known CKD, unchanged from baseline). UA with small leuks and rare bacteria but with epithelial cells. Patient without dysuria or malodor. Doubt UTI.  Laboratory results were personally reviewed by myself and used in the medical decision making of this patient's treatment and disposition.  Pt discharged home in stable condition. Strict ED return precautions dicussed. Pt understands and agrees with the plan and has no further questions or concerns.   Pt care discussed with and followed by my attending, Dr. Merrily Pew  Mayer Camel, MD Pager [phone]  Final Clinical Impressions(s) / ED Diagnoses   Final diagnoses:  Dependent edema  PVD (peripheral vascular disease) [facility])   New Prescriptions Discharge Medication List as of 01/17/2016 10:30 PM       Mayer Camel, MD 01/18/16 0030    Merrily Pew, MD 01/18/16 1845

## 2016-01-17 NOTE — Progress Notes (Signed)
Cardiology Office Note   Date:  01/17/2016   ID:  Teaghan, Melrose 09/09/1920, MRN [id number]  PCP:  Colette Ribas, MD  Cardiologist: Arlington Calix, NP   Chief Complaint  Patient presents with  . Coronary Artery Disease  . Atrial Fibrillation    PAF  . PAD    History of Present Illness: [name] is a 83 y.o. female who presents for ongoing assessment and management of coronary artery disease, cardiac catheterization 2008 with ostial 80% left circumflex lesion, medical therapy only. Paroxysmal atrial fibrillation with cardioversion. She was found to have occlusion of the proximal anterior tibial, proximal/mid posterior tibial on the right. She was seen in the hospital for evaluation for bypass grafting. The patient underwent angiogram on 01/04/2016.  Findings:              Aortogram:  No significant renal artery stenosis.  The infrarenal abdominal aorta is heavily calcified but patent.  Bilateral common and external iliac arteries are widely patent.             Right Lower Extremity:  The right common femoral and profunda femoral artery are patent throughout their course.  The right superficial femoral artery is calcified.  No hemodynamically significant lesions are identified however the luminal caliber is diminished.  The popliteal artery is patent throughout it's course.  There is single vessel runoff via the peroneal artery which perfuses the foot.  There is a high-grade greater than 80% stenosis within the tibioperoneal trunk as well as the proximal peroneal artery             Left Lower Extremity:  Not evaluated Impression:             #1  high-grade stenosis within the tibioperoneal trunk and proximal peroneal artery successfully treated with atherectomy using a CSI 1.25 micro device and a 3 mm coyote balloon  She is here today with her son who is concerned about a newly formed blister on her left heel. He has been leaking serous fluid. It has been dressed  and wrapped by the nurses at the [facility]. She denies any pain at that site. Otherwise she continues to undergo  rehabilitation. She is due to follow-up with the vascular surgeon next week.  Her son has noticed that she's having trouble breathing when she gets up and walks. A lot of this is related to deconditioning.  Past Medical History:  Diagnosis Date  . Acid reflux   . Carotid artery occlusion    minimal  . Coronary artery disease   . Hypercholesteremia   . Hypertension   . Myocardial infarct   . PAF (paroxysmal atrial fibrillation) (HCC)   . Skin disorder   . Systolic murmur   . Thyroid disease     Past Surgical History:  Procedure Laterality Date  . ABDOMINAL HYSTERECTOMY    . AMPUTATION Right 01/05/2016   Procedure: RIGHT GREAT TOE AMPUTATION;  Surgeon: Nada Libman, MD;  Location: [facility] OR;  Service: Vascular;  Laterality: Right;  . CARDIAC CATHETERIZATION    . CARDIOVERSION  2012  . LOWER EXTREMITY ANGIOGRAM Right 12/30/2015   Procedure: RIGHT LOWER EXTREMITY ANGIOGRAM;  Surgeon: Ancil Linsey, MD;  Location: [facility];  Service: Vascular;  Laterality: Right;  accessed through left groin area - dressed with two 2x2 and 4x4 opsite  . PERIPHERAL VASCULAR CATHETERIZATION N/A 01/04/2016   Procedure: Lower Extremity Angiography;  Surgeon: Nada Libman, MD;  Location:  [facility] INVASIVE CV LAB;  Service: Cardiovascular;  Laterality: N/A;  . PERIPHERAL VASCULAR CATHETERIZATION Right 01/04/2016   Procedure: Peripheral Vascular Atherectomy;  Surgeon: Nada LibmanVance W Brabham, MD;  Location: [facility] INVAS[facility] CV LAB;  Service: Cardiovascular;  Laterality: Right;  Peroneal  . THYROID SURGERY       No current outpatient prescriptions on file.   No current facility-administered medications for this visit.     Allergies:   Patient has no known allergies.    Social History:  The patient  reports that she has never smoked. She has never used smokeless tobacco. She reports that she does not drink  alcohol or use drugs.   Family History:  The patient's Family history is unknown by patient.    ROS: All other systems are reviewed and negative. Unless otherwise mentioned in H&P    PHYSICAL EXAM: VS:  BP (!) 194/70   Pulse 71   Ht 5\' 5"  (1.651 m)   Wt 120 lb (54.4 kg)   SpO2 96%   BMI 19.97 kg/m  , BMI Body mass index is 19.97 kg/m. GEN: Well nourished, well developed, in no acute distress  HEENT: normal  Neck: no JVD, carotid bruits, or masses Cardiac: RRR; 1/6 systolic  murmurs, rubs, or gallops,no edema  Respiratory:  Clear to auscultation bilaterally, normal work of breathing GI: soft, nontender, nondistended, + BS MS: no deformity or atrophy She has a dressing to her right foot, with amputation of the right great toe. Left foot has dressing wrapped around her left heel. Skin: warm and dry, no rash Neuro:  Strength and sensation are intact Psych: euthymic mood, full affect  Recent Labs: 12/27/2015: B Natriuretic Peptide 270.0 01/13/2016: BUN 25; Creatinine, Ser 1.30; Hemoglobin 9.7; Platelets 401; Potassium 4.1; Sodium 137; TSH 3.131    Lipid Panel No results found for: CHOL, TRIG, HDL, CHOLHDL, VLDL, LDLCALC, LDLDIRECT    Wt Readings from Last 3 Encounters:  01/17/16 120 lb (54.4 kg)  01/07/16 122 lb 8 oz (55.6 kg)  09/21/15 107 lb (48.5 kg)      Other studies Reviewed:   ASSESSMENT AND PLAN:  1. Peripheral arterial disease: Being followed by vascular surgeons. Now has new left heel ulcer. I have reviewed her ABI studies. She does have ABI of 0.85 on the left. We'll defer to vascular surgery for further recommendations.  2. Left heel ulcer: New over the last 24 hours. It is draining clear fluid. It is wrapped with gauze. There is some drainage noted on the outside of the dressing. I recommended wound care specialist to evaluate. I have ordered a heel protector.  3. Coronary artery disease: Most recent cardiac catheterization revealed 80% diagonal, 80% ostial  left circumflex that has been treated medically. Would like to repeat her echocardiogram to evaluate for changes in LV function causing dyspnea. The patient is likely having this as a result of deconditioning want to be more thorough. Especially with cardiovascular risk factors of hypertension and hyperlipidemia along with age and peripheral arterial disease in the best to reevaluate.  4. Hypertension: Elevated today. She is to continue amiodarone, amlodipine, and Lasix daily. We'll recheck blood pressure on follow-up visit in one month. If he remains elevated we'll increase amlodipine to 5 mg daily. Await echo results.   Current medicines are reviewed at length with the patient today.    Labs/ tests ordered today include: Echocardiogram.   Orders Placed This Encounter  Procedures  . AMB referral to wound care center  Disposition:   FU with one month Signed, Joni Reining, NP  01/17/2016 3:38 PM     [facility] [address], [place], [place] [address] Phone: [phone]; Fax: [phone]

## 2016-01-18 ENCOUNTER — Non-Acute Institutional Stay (SKILLED_NURSING_FACILITY): Payer: Medicare Other | Admitting: Internal Medicine

## 2016-01-18 ENCOUNTER — Inpatient Hospital Stay (HOSPITAL_COMMUNITY)
Admit: 2016-01-18 | Discharge: 2016-01-18 | Disposition: A | Discharge status: Home / Self Care | Payer: Medicare Other | Attending: Emergency Medicine | Admitting: Emergency Medicine

## 2016-01-18 ENCOUNTER — Encounter: Payer: Self-pay | Admitting: Internal Medicine

## 2016-01-18 ENCOUNTER — Other Ambulatory Visit: Payer: Self-pay

## 2016-01-18 ENCOUNTER — Encounter (HOSPITAL_COMMUNITY)
Admission: RE | Admit: 2016-01-18 | Discharge: 2016-01-18 | Disposition: A | Discharge status: Home / Self Care | Payer: Medicare Other | Source: Skilled Nursing Facility | Attending: *Deleted | Admitting: *Deleted

## 2016-01-18 ENCOUNTER — Encounter: Payer: Self-pay | Admitting: Surgery

## 2016-01-18 DIAGNOSIS — R609 Edema, unspecified: Secondary | ICD-10-CM | POA: Diagnosis not present

## 2016-01-18 DIAGNOSIS — L089 Local infection of the skin and subcutaneous tissue, unspecified: Secondary | ICD-10-CM

## 2016-01-18 DIAGNOSIS — R7881 Bacteremia: Secondary | ICD-10-CM

## 2016-01-18 DIAGNOSIS — I739 Peripheral vascular disease, unspecified: Secondary | ICD-10-CM

## 2016-01-18 DIAGNOSIS — M79605 Pain in left leg: Secondary | ICD-10-CM

## 2016-01-18 LAB — BASIC METABOLIC PANEL
ANION GAP: 7 (ref 5–15)
BUN: 17 mg/dL (ref 6–20)
CALCIUM: 8.5 mg/dL — AB (ref 8.9–10.3)
CO2: 31 mmol/L (ref 22–32)
CREATININE: 1.14 mg/dL — AB (ref 0.44–1.00)
Chloride: 101 mmol/L (ref 101–111)
GFR calc non Af Amer: 40 mL/min — ABNORMAL LOW (ref >60)
GFR, EST AFRICAN AMERICAN: 47 mL/min — AB (ref >60)
GLUCOSE: 74 mg/dL (ref 65–99)
Potassium: 3.7 mmol/L (ref 3.5–5.1)
Sodium: 139 mmol/L (ref 135–145)

## 2016-01-18 NOTE — Progress Notes (Signed)
Location:   [place] Room Number: 710/G Place of Service:  SNF [phone]) Provider:  Leana Roe, MD  Patient Care Team: Sharilyn Sites, MD as PCP - General (Family Medicine) Arnoldo Lenis, MD as Consulting Physician (Cardiology)  Extended Emergency Contact Information Primary Emergency Contact: [place] of [place] Phone: [phone] Mobile Phone: [phone] Relation: None Secondary Emergency Contact: [place] of [place] Phone: [phone] Relation: None  Code Status:  DNR Goals of care: Advanced Directive information Advanced Directives 01/18/2016  Does Patient Have a Medical Advance Directive? Yes  Type of Advance Directive Out of facility DNR (pink MOST or yellow form)  Does patient want to make changes to medical advance directive? No - Patient declined  Copy of [place] in Chart? -     Chief Complaint  Patient presents with  . Acute Visit    F/U Returned from [facility] ER visit    HPI:  Pt is a 83 y.o. female seen today for an acute visit for Follow-up of ER visit last night.  Patient was sent to the ER secondary to some increased edema more so on the left as well as concerns over her wounds on the right and left foot including a new left heel blister. She does have a history of suspected osteomyelitis right foot and is on a four-week course of Ancef.  She actually initially presented to hospital with gangrene on the right great toe which required amputation she did undergo angioplasty of the right perineal artery and right great toe amputation.     From review of ER note it appears the wounds were thought to be stable-there was concern about the left leg edema however and she was empirically started on Lovenox since vascular studies were not available at that hour. To rule out a DVT  Today she is going to receive a Doppler ultrasound as well as  arterial Dopplers  She did have a cardiac echo done earlier this month which showed a vigorous left ventricular ejection fraction of 60-65 percent but grade 2 diastolic dysfunction-she is on Lasix when necessary.--Weight none today was actually down 10 pounds from her baseline one would wonder about a scale variation -- is not complaining of any shortness of breath or chest pain  She is again resting comfortably in her wheelchair she has no complaints says her feet actually do not her today.  Did assess her wounds on the left base of the second to fourth toes there appears to be a wound bed were blisters were previously she also has a left heel covered-she is status post right great toe amputation as well as.  Per ER evaluation they did not really see concerning erythema on that foot.  He also did lab work which appear to be reassuring white count was normal at 6.2 hemoglobin actually improved at 11.3-renal function showed a creatinine 1.14 which is relatively baseline  She does have vascular follow-up scheduled in this certainly will have to be closely followed.  .     Past Medical History:  Diagnosis Date  . Acid reflux   . Carotid artery occlusion    minimal  . Coronary artery disease   . Hypercholesteremia   . Hypertension   . Myocardial infarct   . PAF (paroxysmal atrial fibrillation) ([place])   . Skin disorder   . Systolic murmur   . Thyroid disease    Past Surgical History:  Procedure Laterality Date  . ABDOMINAL HYSTERECTOMY    . AMPUTATION Right 01/05/2016   Procedure: RIGHT GREAT TOE AMPUTATION;  Surgeon: Serafina Mitchell, MD;  Location: [place];  Service: Vascular;  Laterality: Right;  . CARDIAC CATHETERIZATION    . CARDIOVERSION  2012  . LOWER EXTREMITY ANGIOGRAM Right 12/30/2015   Procedure: RIGHT LOWER EXTREMITY ANGIOGRAM;  Surgeon: Vickie Epley, MD;  [facility];  Service: Vascular;  Laterality: Right;  accessed through left groin area - dressed with two 2x2  and 4x4 opsite  . PERIPHERAL VASCULAR CATHETERIZATION N/A 01/04/2016   Procedure: Lower Extremity Angiography;  Surgeon: Serafina Mitchell, MD;  Location: [place] CV LAB;  Service: Cardiovascular;  Laterality: N/A;  . PERIPHERAL VASCULAR CATHETERIZATION Right 01/04/2016   Procedure: Peripheral Vascular Atherectomy;  Surgeon: Serafina Mitchell, MD;  Location: [place] CV LAB;  Service: Cardiovascular;  Laterality: Right;  Peroneal  . THYROID SURGERY      No Known Allergies  Current Outpatient Prescriptions on File Prior to Visit  Medication Sig Dispense Refill  . amiodarone (PACERONE) 200 MG tablet Take 0.5 tablets (100 mg total) by mouth daily. 15 tablet 8  . amLODipine (NORVASC) 2.5 MG tablet Take 2.5 mg by mouth daily.  5  . aspirin EC 81 MG tablet Take 1 tablet (81 mg total) by mouth daily. 90 tablet 3  . atorvastatin (LIPITOR) 20 MG tablet Take 20 mg by mouth See admin instructions. Take 1 tablet (20 mg) by mouth every other night    . ceFAZolin (ANCEF) IVPB Inject 2 g into the vein every 12 (twelve) hours. Indication:  bacteremia Last Day of Therapy:  01/28/16 Labs - Once weekly:  CBC/D and BMP, Labs - Every other week:  ESR and CRP 44 Units 0  . furosemide (LASIX) 20 MG tablet TAKE ONE TABLET BY MOUTH EVERY DAY AS NEEDED FOR SWELLING. 30 tablet 3  . levothyroxine (SYNTHROID, LEVOTHROID) 75 MCG tablet Take 75 mcg by mouth daily.      Marland Kitchen omeprazole (PRILOSEC) 40 MG capsule Take 40 mg by mouth at bedtime.    . vitamin B-12 (CYANOCOBALAMIN) 1000 MCG tablet Take 1,000 mcg by mouth daily.     No current facility-administered medications on file prior to visit.      Review of Systems   In general she is denying any fever or chills or diaphoresis.  Skin does not complain of rashes or itching does have foot issues as noted above but is not really complaining of pain.  Head ears eyes nose mouth and throat does not complaining of any sore throat visual changes.  Cardiac does not  complaining of chest pain or palpitations does have some continued left foot edema.  Respiratory is not complaining of shortness breath or cough.  GI is not complaining of any nausea vomiting diarrhea or abdominal discomfort.  Musculoskeletal is not really complaining of significant pain.  Neurologic is not complaining of dizziness or syncope  Psyche--does not complain of anxiety or depressive mood    There is no immunization history on file for this patient. Pertinent  Health Maintenance Due  Topic Date Due  . DEXA SCAN  02/09/1987  . INFLUENZA VACCINE  12/02/2016 (Originally 08/03/2015)  . PNA vac Low Risk Adult (1 of 2 - PCV13) 12/02/2016 (Originally 02/09/1987)   No flowsheet data found. Functional Status Survey:    Vitals:   01/18/16 1332  BP: 132/73  Pulse: 82  Resp: 18  Temp: 98.5 F (36.9  C)  TempSrc: Oral  SpO2: 99%   Weight today is 113.6 this is actually down about 10 pounds from her baseline-one would wonder about a scale variation here-she will need be reweighed tomorrow  Physical Exam Gen. this is a pleasant elderly female in no distress sitting comfortably in her wheelchair HENT:  Head: Normocephalic.  Mouth/Throat: Oropharynx is clear and moist.  Eyes: Pupils are equal, round, and reactive to light.   Cardiovascular: Normal rateand normal heart sounds.  Pulmonary/Chest: Effort normaland breath sounds normal. No respiratory distress. She has no wheezes. She has no rales.  Abdominal: Soft. Bowel sounds are normal. She exhibits no distension. There is no tenderness. There is no rebound.  Musculoskeletal: Appears to have some edema of her left foot    Neurological: She is grossly alert and oriented pleasant and appropriatr Moving all extremities well Skin: Skin is warmand dry. No rashnoted (S The area on her left great toe that had the blisters yesterday appears to have now wound beds instead with the blisters were I do not really note much  erythema surrounding this-there are now is a wound bed wherew the left heel blister was-there is some surrounding erythema  On the right foot she is status post right great toe amputation presentation is improved compared to yesterday --there is less erythema      Labs reviewed:  Recent Labs  01/13/16 1047 01/17/16 1718 01/18/16 0745  NA 137 139 139  K 4.1 3.6 3.7  CL 103 99* 101  CO2 _0 GLUCOSE 97 88 74  BUN 25* 18 17  CREATININE 1.30* 1.20* 1.14*  CALCIUM 8.4* 9.0 8.5*    Recent Labs  01/17/16 1718  AST 22  ALT <5*  ALKPHOS 95  BILITOT 0.5  PROT 7.0  ALBUMIN 2.9*    Recent Labs  01/08/16 0622 01/10/16 0700 01/13/16 1047 01/17/16 1718  WBC 5.5 5.4 5.4 6.2  NEUTROABS 3.4 3.7  --  4.1  HGB 9.3* 8.5* 9.7* 11.3*  HCT 28.4* 26.5* 29.3* 35.3*  MCV 95.6 96.0 96.4 95.1  PLT 348 351 401* 340   Lab Results  Component Value Date   TSH 3.131 01/13/2016   No results found for: HGBA1C No results found for: CHOL, HDL, LDLCALC, LDLDIRECT, TRIG, CHOLHDL  Significant Diagnostic Results in last 30 days:  US Arterial Seg Single  Result Date: 12/28/2015 CLINICAL DATA:  Nonhealing right great toe wound, gangrene, osteomyelitis EXAM: NONINVASIVE PHYSIOLOGIC VASCULAR STUDY OF BILATERAL LOWER EXTREMITIES TECHNIQUE: Evaluation of both lower extremities were performed at rest, including calculation of ankle-brachial indices with single level Doppler, pressure and pulse volume recording. COMPARISON:  None available FINDINGS: Right ABI:  0.58 Left ABI:  0.86 Right Lower Extremity: Irregular monophasic right posterior tibial waveform. Biphasic right dorsalis pedis waveform. Markedly decreased right ABI measuring 0.58 indicative of significant or moderate range right lower extremity peripheral vascular disease. Left Lower Extremity: Biphasic left posterior tibial and dorsalis pedis waveforms. Mildly decreased ABI measuring 0.86. Suspect nonocclusive left lower extremity vascular  disease. IMPRESSION: Right ABI 0.58 indicative of moderate range right lower extremity peripheral vascular disease. Minor decrease in the left ABI as above. Electronically Signed   By: Jerilynn Mages.  Shick M.D.   On: 12/28/2015 12:21   [place] Lower Ext Art Right  Result Date: 12/28/2015 CLINICAL DATA:  Right great toe gangrene and osteomyelitis. EXAM: UNILATERAL RIGHT LOWER EXTREMITY ARTERIAL DUPLEX SCAN TECHNIQUE: Gray-scale sonography as well as color Doppler and duplex ultrasound was performed to evaluate  the arteries of the lower extremity. COMPARISON:  ABI examination 12/28/2015 FINDINGS: Atherosclerotic calcifications in the right common femoral artery with a monophasic waveform. Deep femoral artery is patent with monophasic waveform. Calcifications and plaque in the right SFA. Monophasic waveforms throughout the right SFA. Peak systolic velocity in the proximal right SFA is 133 cm/sec and the peak systolic velocity in the mid SFA is 221 cm/sec. Atherosclerotic calcifications in the right popliteal artery with monophasic waveform. Monophasic waveform in the right peroneal artery. Monophasic waveform in the right posterior tibial artery. The distal right posterior tibial artery may be occluded. Monophasic waveform in the anterior tibial artery. Markedly elevated peak systolic velocity in the proximal anterior tibial artery measuring up to 496 cm/sec, this is compatible with stenosis. IMPRESSION: Diffuse atherosclerotic disease throughout the right lower extremity arteries. Right runoff disease demonstrated by stenosis in the right anterior tibial artery and occlusion or minimal flow in the distal posterior tibial artery. At least mild stenosis in the mid right SFA. Monophasic waveforms throughout the right lower extremity may signify underlying inflow disease. Electronically Signed   By: Markus Daft M.D.   On: 12/28/2015 14:41   Dg Chest Port 1 View  Result Date: 12/27/2015 CLINICAL DATA:  Cough.  Right great toe  infection. EXAM: PORTABLE CHEST 1 VIEW COMPARISON:  PA and lateral chest 05/14/2009. Single-view of the chest 05/10/2009 and 04/25/2007. FINDINGS: There is cardiomegaly and vascular congestion. No consolidative process, pneumothorax or effusion. IMPRESSION: Cardiomegaly and pulmonary vascular congestion. Electronically Signed   By: Inge Rise M.D.   On: 12/27/2015 14:48   Dg Foot Complete Right  Result Date: 12/27/2015 CLINICAL DATA:  The patient tried her great toenail her of this week with onset of swelling, wound in bowel odor. The toe is discolored. Initial encounter. EXAM: RIGHT FOOT COMPLETE - 3+ VIEW COMPARISON:  None. FINDINGS: Soft tissues of the great toe are markedly swollen. There is bony destructive change throughout the distal phalanx a comminuted fracture that is transverse in orientation through the metaphysis and extends the articular surface. Bones are osteopenic. No other acute bony abnormality is seen. IMPRESSION: Findings most consistent with osteomyelitis and pathologic fracture of the distal phalanx of the right great toe. Electronically Signed   By: Inge Rise M.D.   On: 12/27/2015 13:17   [place] Saphenous Vein Mapping Right  Result Date: 01/01/2016 CLINICAL DATA:  Right toe gangrene. Please evaluate patency and size of right greater saphenous vein for potential venous bypass. EXAM: RIGHT LOWER EXTREMITY VENOUS MAPPING. TECHNIQUE: Gray-scale sonography with graded compression, as well as color Doppler and duplex ultrasound were performed to evaluate the lower extremity deep venous systems from the level of the common femoral vein and including the common femoral, femoral, profunda femoral, popliteal and calf veins including the posterior tibial, peroneal and gastrocnemius veins when visible. The superficial great saphenous vein was also interrogated. Spectral Doppler was utilized to evaluate flow at rest and with distal augmentation maneuvers in the common femoral, femoral  and popliteal veins. COMPARISON:  None. FINDINGS: The greater saphenous vein appears widely patent throughout its imaged course. The greater saphenous vein measures 0.5 cm proximally (image 5), approximately 0.5 cm at the level of the distal thigh (image 17), 0.4 cm at the level the proximal calf (image 21) and 0.3 cm at the level of the distal calf (image 29). The right common femoral vein  appears widely patent where imaged. IMPRESSION: Wide patency of the right greater saphenous vein with measurements as above.  Electronically Signed   By: Sandi Mariscal M.D.   On: 01/01/2016 07:56    Assessment/Plan  #1-status post ER visit-edema was noted last her studies are being done as noted above awaiting those results clinically she appears stable without complaints of chest pain shortness of breath or  foot pain.  Regards to her foot wound issue she continues on Ancef she will need expedient vascular follow-up which has been arranged.  She is afebrile --.  Her wounds well have to be followed closely I did assess her wounds with the wound care nurse  who will be following this--erythema surrounding the blister on the left will have to be watched for any progression.  Addendum.  We have obtain results of the vascular studies venous Doppler was negative for any DVT bilaterally.  It did show subcutaneous edema of the left ankle.  Regards to arterial studies ankle-brachial brachial indices could not be obtained because the ankle arteries are noncompressible likely elderly calcified  Doppler waveforms both ankles did suggest underlying peripheral vascular disease waveforms were similar to the examination on 12/28/2015.  In regards to weight loss suspect this may be a scale variation she will need to be reweighed tomorrow.  does have a when necessary Lasix order with an echo earlier this month showing grade 2 diastolic dysfunction left ventricular ejection fraction was 60-65 percent Will check a BNP  tomorrow        [id number]  y      Oralia Manis, [place]

## 2016-01-19 ENCOUNTER — Encounter (HOSPITAL_COMMUNITY)
Admission: RE | Admit: 2016-01-19 | Discharge: 2016-01-19 | Disposition: A | Discharge status: Home / Self Care | Payer: Medicare Other | Source: Skilled Nursing Facility | Attending: Internal Medicine | Admitting: Internal Medicine

## 2016-01-19 ENCOUNTER — Other Ambulatory Visit (HOSPITAL_COMMUNITY)
Admission: AD | Admit: 2016-01-19 | Discharge: 2016-01-19 | Disposition: A | Discharge status: Home / Self Care | Payer: Medicare Other | Source: Skilled Nursing Facility | Attending: Internal Medicine | Admitting: Internal Medicine

## 2016-01-19 DIAGNOSIS — I10 Essential (primary) hypertension: Secondary | ICD-10-CM | POA: Insufficient documentation

## 2016-01-19 DIAGNOSIS — D500 Iron deficiency anemia secondary to blood loss (chronic): Secondary | ICD-10-CM | POA: Diagnosis present

## 2016-01-19 LAB — BRAIN NATRIURETIC PEPTIDE: B Natriuretic Peptide: 122 pg/mL — ABNORMAL HIGH (ref 0.0–100.0)

## 2016-01-19 LAB — BASIC METABOLIC PANEL
Anion gap: 7 (ref 5–15)
BUN: 23 mg/dL — ABNORMAL HIGH (ref 6–20)
CALCIUM: 8.6 mg/dL — AB (ref 8.9–10.3)
CO2: 30 mmol/L (ref 22–32)
CREATININE: 1.36 mg/dL — AB (ref 0.44–1.00)
Chloride: 101 mmol/L (ref 101–111)
GFR calc non Af Amer: 32 mL/min — ABNORMAL LOW (ref >60)
GFR, EST AFRICAN AMERICAN: 38 mL/min — AB (ref >60)
Glucose, Bld: 110 mg/dL — ABNORMAL HIGH (ref 65–99)
Potassium: 3.6 mmol/L (ref 3.5–5.1)
Sodium: 138 mmol/L (ref 135–145)

## 2016-01-20 ENCOUNTER — Encounter (HOSPITAL_COMMUNITY)
Admission: RE | Admit: 2016-01-20 | Discharge: 2016-01-20 | Disposition: A | Discharge status: Home / Self Care | Payer: Medicare Other | Source: Skilled Nursing Facility | Attending: *Deleted | Admitting: *Deleted

## 2016-01-20 LAB — CBC WITH DIFFERENTIAL/PLATELET
Basophils Absolute: 0 10*3/uL (ref 0.0–0.1)
Basophils Relative: 1 %
Eosinophils Absolute: 0.2 10*3/uL (ref 0.0–0.7)
Eosinophils Relative: 4 %
HEMATOCRIT: 32 % — AB (ref 36.0–46.0)
HEMOGLOBIN: 10.3 g/dL — AB (ref 12.0–15.0)
LYMPHS ABS: 1.3 10*3/uL (ref 0.7–4.0)
LYMPHS PCT: 22 %
MCH: 31.4 pg (ref 26.0–34.0)
MCHC: 32.2 g/dL (ref 30.0–36.0)
MCV: 97.6 fL (ref 78.0–100.0)
Monocytes Absolute: 0.8 10*3/uL (ref 0.1–1.0)
Monocytes Relative: 12 %
NEUTROS PCT: 61 %
Neutro Abs: 3.8 10*3/uL (ref 1.7–7.7)
Platelets: 354 10*3/uL (ref 150–400)
RBC: 3.28 MIL/uL — AB (ref 3.87–5.11)
RDW: 14.4 % (ref 11.5–15.5)
WBC: 6.2 10*3/uL (ref 4.0–10.5)

## 2016-01-20 LAB — BASIC METABOLIC PANEL
Anion gap: 9 (ref 5–15)
BUN: 30 mg/dL — ABNORMAL HIGH (ref 6–20)
CHLORIDE: 103 mmol/L (ref 101–111)
CO2: 28 mmol/L (ref 22–32)
Calcium: 8.7 mg/dL — ABNORMAL LOW (ref 8.9–10.3)
Creatinine, Ser: 1.18 mg/dL — ABNORMAL HIGH (ref 0.44–1.00)
GFR calc non Af Amer: 39 mL/min — ABNORMAL LOW (ref >60)
GFR, EST AFRICAN AMERICAN: 45 mL/min — AB (ref >60)
Glucose, Bld: 107 mg/dL — ABNORMAL HIGH (ref 65–99)
POTASSIUM: 4 mmol/L (ref 3.5–5.1)
SODIUM: 140 mmol/L (ref 135–145)

## 2016-01-21 ENCOUNTER — Non-Acute Institutional Stay (SKILLED_NURSING_FACILITY): Payer: Medicare Other | Admitting: Internal Medicine

## 2016-01-21 ENCOUNTER — Encounter (HOSPITAL_COMMUNITY): Payer: Self-pay | Admitting: Emergency Medicine

## 2016-01-21 ENCOUNTER — Emergency Department (HOSPITAL_COMMUNITY)
Admission: EM | Admit: 2016-01-21 | Discharge: 2016-01-21 | Disposition: A | Discharge status: Home / Self Care | Payer: Medicare Other | Attending: Emergency Medicine | Admitting: Emergency Medicine

## 2016-01-21 ENCOUNTER — Encounter: Payer: Self-pay | Admitting: Internal Medicine

## 2016-01-21 ENCOUNTER — Inpatient Hospital Stay
Admission: RE | Admit: 2016-01-21 | Discharge: 2016-02-01 | Payer: Medicare Other | Source: Ambulatory Visit | Attending: Emergency Medicine | Admitting: Emergency Medicine

## 2016-01-21 DIAGNOSIS — I10 Essential (primary) hypertension: Secondary | ICD-10-CM | POA: Insufficient documentation

## 2016-01-21 DIAGNOSIS — R42 Dizziness and giddiness: Secondary | ICD-10-CM | POA: Diagnosis not present

## 2016-01-21 DIAGNOSIS — Z79899 Other long term (current) drug therapy: Secondary | ICD-10-CM | POA: Diagnosis not present

## 2016-01-21 DIAGNOSIS — I2510 Atherosclerotic heart disease of native coronary artery without angina pectoris: Secondary | ICD-10-CM | POA: Insufficient documentation

## 2016-01-21 DIAGNOSIS — Z7982 Long term (current) use of aspirin: Secondary | ICD-10-CM | POA: Diagnosis not present

## 2016-01-21 LAB — CBC WITH DIFFERENTIAL/PLATELET
Basophils Absolute: 0.1 10*3/uL (ref 0.0–0.1)
Basophils Relative: 1 %
EOS PCT: 5 %
Eosinophils Absolute: 0.3 10*3/uL (ref 0.0–0.7)
HCT: 33.7 % — ABNORMAL LOW (ref 36.0–46.0)
HEMOGLOBIN: 10.9 g/dL — AB (ref 12.0–15.0)
LYMPHS ABS: 1.3 10*3/uL (ref 0.7–4.0)
LYMPHS PCT: 26 %
MCH: 31.1 pg (ref 26.0–34.0)
MCHC: 32.3 g/dL (ref 30.0–36.0)
MCV: 96 fL (ref 78.0–100.0)
MONOS PCT: 11 %
Monocytes Absolute: 0.6 10*3/uL (ref 0.1–1.0)
Neutro Abs: 2.9 10*3/uL (ref 1.7–7.7)
Neutrophils Relative %: 57 %
PLATELETS: 368 10*3/uL (ref 150–400)
RBC: 3.51 MIL/uL — AB (ref 3.87–5.11)
RDW: 14 % (ref 11.5–15.5)
WBC: 5.1 10*3/uL (ref 4.0–10.5)

## 2016-01-21 LAB — URINALYSIS, ROUTINE W REFLEX MICROSCOPIC
Bilirubin Urine: NEGATIVE
GLUCOSE, UA: NEGATIVE mg/dL
Hgb urine dipstick: NEGATIVE
Ketones, ur: NEGATIVE mg/dL
Leukocytes, UA: NEGATIVE
Nitrite: NEGATIVE
Protein, ur: NEGATIVE mg/dL
SPECIFIC GRAVITY, URINE: 1.01 (ref 1.005–1.030)
pH: 7.5 (ref 5.0–8.0)

## 2016-01-21 LAB — BASIC METABOLIC PANEL
Anion gap: 11 (ref 5–15)
BUN: 25 mg/dL — ABNORMAL HIGH (ref 6–20)
CHLORIDE: 99 mmol/L — AB (ref 101–111)
CO2: 31 mmol/L (ref 22–32)
Calcium: 9.1 mg/dL (ref 8.9–10.3)
Creatinine, Ser: 1.11 mg/dL — ABNORMAL HIGH (ref 0.44–1.00)
GFR calc Af Amer: 48 mL/min — ABNORMAL LOW (ref >60)
GFR calc non Af Amer: 41 mL/min — ABNORMAL LOW (ref >60)
Glucose, Bld: 83 mg/dL (ref 65–99)
POTASSIUM: 3.6 mmol/L (ref 3.5–5.1)
SODIUM: 141 mmol/L (ref 135–145)

## 2016-01-21 LAB — PROTIME-INR
INR: 1.02
Prothrombin Time: 13.4 seconds (ref 11.4–15.2)

## 2016-01-21 MED ORDER — CLONIDINE HCL 0.1 MG PO TABS
0.1000 mg | ORAL_TABLET | Freq: Once | ORAL | Status: AC
  Administered 2016-01-21: 0.1 mg via ORAL
  Filled 2016-01-21: qty 1

## 2016-01-21 MED ORDER — AMLODIPINE BESYLATE 5 MG PO TABS: 5.0000 mg | ORAL_TABLET | Freq: Every day | ORAL | 0 refills | Status: DC

## 2016-01-21 NOTE — ED Notes (Signed)
Dr Particia NearingHaviland notified of lowered bp without intervention.  Will continue to monitor for need for Clonidine.

## 2016-01-21 NOTE — ED Notes (Addendum)
[facility] to see what dressings pt needed on her feet.  We do not have those dressing supplies and she stated she will redress when she returns.

## 2016-01-21 NOTE — ED Notes (Signed)
[facility].

## 2016-01-21 NOTE — ED Triage Notes (Signed)
[facility]'s [facility] was high during PT. Patient here for evaluation.

## 2016-01-21 NOTE — Progress Notes (Signed)
Location:   [facility] Room Number: 329/J Place of Service:  SNF [phone]) Provider:  Leana Roe, MD  Patient Care Team: Sharilyn Sites, MD as PCP - General (Family Medicine) Arnoldo Lenis, MD as Consulting Physician (Cardiology)  Extended Emergency Contact Information Primary Emergency Contact: [place] Phone: [phone] Mobile Phone: [phone] Relation: None Secondary Emergency Contact: [place] Phone: [phone] Relation: None  Code Status:  DNR Goals of care: Advanced Directive information Advanced Directives 01/21/2016  Does Patient Have a Medical Advance Directive? Yes  Type of Advance Directive Out of facility DNR (pink MOST or yellow form)  Does patient want to make changes to medical advance directive? No - Patient declined  Copy of [place] in Chart? -       Chief Complaint  Patient presents with  . Acute Visit    Hypertension    HPI:  Pt is a 83 y.o. female seen today for an acute visit for Hypertension issues-she does have a history of hypertension as well as atrial fibrillation which apparently has been well controlled for a considerable amount of time.  She is on amiodarone.  Regards to hypertension she is on Norvasc 2.5 mg a day Her blood pressures here been somewhat variable with systolics in the 062B to 762 range it appears-however during therapy today she did complain of dizziness and blood pressure was taken  and notable for systolic in the 831D.  She was taken back to her room when she was no longer at that point complaining of dizziness or any shortness of breath or chest pain systolic while in the wheelchair continued be in the 190s.  However when she lied down her blood pressure did go up to 210/68.  She continues to deny currently any dizziness chest pain shortness of breath or numbness. She has  complained to nursing of fatigue feeling tired  I also note she does have a history of carotid artery occlusion thought to be minimal.  She is currently on a prolonged course of Ancef for osteomyelitis status post right great toe removal secondary to gangrene.  Continues to have wounds bilaterally.  She is afebrile other than blood pressure vital signs appear to be stable.  Per review of cardiology notes in the past her atrial fibrillation appears to be controlled-hypertension also had been well controlled-she does apparently have a history of dizziness at times-per cardiology note on 06/28/2015 and was thought to occur mainly with standing was thought possibly at that point she was possibly a bit dehydrated apparently water intake was encouraged at that point.       Past Medical History:  Diagnosis Date  . Acid reflux   . Carotid artery occlusion    minimal  . Coronary artery disease   . Hypercholesteremia   . Hypertension   . Myocardial infarct   . PAF (paroxysmal atrial fibrillation) ([facility])   . Skin disorder   . Systolic murmur   . Thyroid disease    Past Surgical History:  Procedure Laterality Date  . ABDOMINAL HYSTERECTOMY    . AMPUTATION Right 01/05/2016   Procedure: RIGHT GREAT TOE AMPUTATION;  Surgeon: Serafina Mitchell, MD;  Location: [facility];  Service: Vascular;  Laterality: Right;  . CARDIAC CATHETERIZATION    . CARDIOVERSION  2012  . LOWER EXTREMITY ANGIOGRAM Right 12/30/2015   Procedure: RIGHT LOWER EXTREMITY ANGIOGRAM;  Surgeon: Corene Cornea  Yolanda Bonine, MD;  Location: [facility];  Service: Vascular;  Laterality: Right;  accessed through left groin area - dressed with two 2x2 and 4x4 opsite  . PERIPHERAL VASCULAR CATHETERIZATION N/A 01/04/2016   Procedure: Lower Extremity Angiography;  Surgeon: Serafina Mitchell, MD;  Location: [facility];  Service: Cardiovascular;  Laterality: N/A;  . PERIPHERAL VASCULAR CATHETERIZATION Right 01/04/2016   Procedure: Peripheral Vascular  Atherectomy;  Surgeon: Serafina Mitchell, MD;  Location: [facility];  Service: Cardiovascular;  Laterality: Right;  Peroneal  . THYROID SURGERY      No Known Allergies  Current Outpatient Prescriptions on File Prior to Visit  Medication Sig Dispense Refill  . amiodarone (PACERONE) 200 MG tablet Take 0.5 tablets (100 mg total) by mouth daily. 15 tablet 8  . amLODipine (NORVASC) 2.5 MG tablet Take 2.5 mg by mouth daily.  5  . aspirin EC 81 MG tablet Take 1 tablet (81 mg total) by mouth daily. 90 tablet 3  . atorvastatin (LIPITOR) 20 MG tablet Take 20 mg by mouth See admin instructions. Take 1 tablet (20 mg) by mouth every other night    . ceFAZolin (ANCEF) IVPB Inject 2 g into the vein every 12 (twelve) hours. Indication:  bacteremia Last Day of Therapy:  01/28/16 Labs - Once weekly:  CBC/D and BMP, Labs - Every other week:  ESR and CRP 44 Units 0  . furosemide (LASIX) 20 MG tablet TAKE ONE TABLET BY MOUTH EVERY DAY AS NEEDED FOR SWELLING. 30 tablet 3  . levothyroxine (SYNTHROID, LEVOTHROID) 75 MCG tablet Take 75 mcg by mouth daily.      Marland Kitchen omeprazole (PRILOSEC) 40 MG capsule Take 40 mg by mouth at bedtime.    . vitamin B-12 (CYANOCOBALAMIN) 1000 MCG tablet Take 1,000 mcg by mouth daily.     No current facility-administered medications on file prior to visit.     Review of Systems   General does not complaining fever or chills has told nursing however she feels tired.  Skin does have significant lower external he wounds as noted above.  Head ears eyes nose mouth and throat does not complaining of any visual changes sore throat.  Respiratory denies shortness of breath or cough.  Cardiac is not complaining of any chest pain has had some lower extremity edema more so on the left Doppler was negative for DVT.  GI is not complaining of any nausea vomiting or abdominal pain.  Musculoskeletal says she feels somewhat weak but this does not appear to be an acute change.  Neurologic  is not complaining of dizziness at this time did complain of dizziness earlier while at therapy-is not complaining of any headache or numbness.or syncope  Psych is not complaining of anxiety or depression appears to be at baseline    There is no immunization history on file for this patient. Pertinent  Health Maintenance Due  Topic Date Due  . DEXA SCAN  02/09/1987  . INFLUENZA VACCINE  12/02/2016 (Originally 08/03/2015)  . PNA vac Low Risk Adult (1 of 2 - PCV13) 12/02/2016 (Originally 02/09/1987)   No flowsheet data found. Functional Status Survey:    Vitals:   01/21/16 1147  BP: (!) 210/68  Pulse: 62  Resp: (!) 24  Temp: 97.6 F (36.4 C)  TempSrc: Oral    Physical Exam  Gen. this is a pleasant elderly female in no distress sitting comfortably in her wheelchair HENT:  Head: Normocephalic.  Mouth/Throat: Oropharynx is clear and moist.  Eyes: Pupils appear to be reactive to light bilaterally.   Cardiovascular: Normal rateand normal heart sounds.  Pulmonary/Chest: Effort normaland breath sounds normal. No respiratory distress. She has no wheezes. She has no rales.  Abdominal: Soft. Bowel sounds are normal. She exhibits no distension. There is no tenderness. There is no rebound.  Musculoskeletal:  Is able to move all extremities 4 with baseline strength it appears  Neurological: She is grossly alert and oriented pleasant and appropriatr Moving all extremities well Shoulder shrug is intact bilaterally-. Her speech is clear Cranial nerves appear to be intact no lateralizing findings Skin: Skin is warmand dry. No rashnoted ( She does have wounds on her lower extremities which have been followed by wound care they were not assessed during this visit currently they are covered     Labs reviewed:  Recent Labs  01/18/16 0745 01/19/16 1000 01/20/16 1315  NA 139 138 140  K 3.7 3.6 4.0  CL 101 101 103  CO2 '31 30 28  '$ GLUCOSE 74 110* 107*  BUN 17 23* 30*    CREATININE 1.14* 1.36* 1.18*  CALCIUM 8.5* 8.6* 8.7*    Recent Labs  01/17/16 1718  AST 22  ALT <5*  ALKPHOS 95  BILITOT 0.5  PROT 7.0  ALBUMIN 2.9*    Recent Labs  01/10/16 0700 01/13/16 1047 01/17/16 1718 01/20/16 1315  WBC 5.4 5.4 6.2 6.2  NEUTROABS 3.7  --  4.1 3.8  HGB 8.5* 9.7* 11.3* 10.3*  HCT 26.5* 29.3* 35.3* 32.0*  MCV 96.0 96.4 95.1 97.6  PLT 351 401* 340 354   Lab Results  Component Value Date   TSH 3.131 01/13/2016   No results found for: HGBA1C No results found for: CHOL, HDL, LDLCALC, LDLDIRECT, TRIG, CHOLHDL  Significant Diagnostic Results in last 30 days:  US Venous Img Lower Bilateral  Result Date: 01/18/2016 CLINICAL DATA:  Bilateral lower extremity edema. History of amputation of the right great toe. Evaluate for DVT. EXAM: BILATERAL LOWER EXTREMITY VENOUS DOPPLER ULTRASOUND TECHNIQUE: Gray-scale sonography with graded compression, as well as color Doppler and duplex ultrasound were performed to evaluate the lower extremity deep venous systems from the level of the common femoral vein and including the common femoral, femoral, profunda femoral, popliteal and calf veins including the posterior tibial, peroneal and gastrocnemius veins when visible. The superficial great saphenous vein was also interrogated. Spectral Doppler was utilized to evaluate flow at rest and with distal augmentation maneuvers in the common femoral, femoral and popliteal veins. COMPARISON:  None. FINDINGS: RIGHT LOWER EXTREMITY Common Femoral Vein: No evidence of thrombus. Normal compressibility, respiratory phasicity and response to augmentation. Saphenofemoral Junction: No evidence of thrombus. Normal compressibility and flow on color Doppler imaging. Profunda Femoral Vein: No evidence of thrombus. Normal compressibility and flow on color Doppler imaging. Femoral Vein: No evidence of thrombus. Normal compressibility, respiratory phasicity and response to augmentation. Popliteal Vein:  No evidence of thrombus. Normal compressibility, respiratory phasicity and response to augmentation. Calf Veins: No evidence of thrombus. Normal compressibility and flow on color Doppler imaging. Superficial Great Saphenous Vein: No evidence of thrombus. Normal compressibility and flow on color Doppler imaging. Venous Reflux:  None. Other Findings: A minimal amount of edema is noted at the level of the right lower leg and calf (image 36). LEFT LOWER EXTREMITY Common Femoral Vein: No evidence of thrombus. Normal compressibility, respiratory phasicity and response to augmentation. Saphenofemoral Junction: No evidence of thrombus. Normal compressibility and flow on color Doppler imaging. Profunda Femoral Vein: No  evidence of thrombus. Normal compressibility and flow on color Doppler imaging. Femoral Vein: No evidence of thrombus. Normal compressibility, respiratory phasicity and response to augmentation. Popliteal Vein: No evidence of thrombus. Normal compressibility, respiratory phasicity and response to augmentation. Calf Veins: No evidence of thrombus. Normal compressibility and flow on color Doppler imaging. Superficial Great Saphenous Vein: No evidence of thrombus. Normal compressibility and flow on color Doppler imaging. Venous Reflux:  None. Other Findings: A minimal amount of subcutaneous edema is noted at the level of left ankle (image 73). IMPRESSION: No evidence of DVT within either lower extremity. Electronically Signed   By: Ronny Bacon M.D.   On: 01/18/2016 14:05   US Arterial Seg Single  Result Date: 01/18/2016 CLINICAL DATA:  Left lower extremity pain. Right great toe gangrene and osteomyelitis. Blisters and nonhealing wounds in the left foot. EXAM: NONINVASIVE PHYSIOLOGIC VASCULAR STUDY OF BILATERAL LOWER EXTREMITIES TECHNIQUE: Evaluation of both lower extremities were performed at rest, including calculation of ankle-brachial indices with single level Doppler, pressure and pulse volume recording.  COMPARISON:  Arteriogram 12/30/2015, ankle-brachial indices from 12/28/2015 FINDINGS: Right ABI:  Not obtainable Left ABI:  Not obtainable Right Lower Extremity: The right ankle arteries are noncompressible, therefore, ankle-brachial indices could not be obtained. Monophasic Doppler waveforms in the right ankle. Left Lower Extremity: Left ankle arteries are noncompressible. Biphasic and monophasic Doppler waveforms in the left ankle. Waveforms are similar to the previous examination. IMPRESSION: Ankle-brachial indices could not be obtained because the ankle arteries are noncompressible and likely heavily calcified. Doppler waveforms in both ankles suggest underlying peripheral vascular disease. Doppler waveforms are similar to the examination on 12/28/2015. Electronically Signed   By: Markus Daft M.D.   On: 01/18/2016 14:11   US Arterial Seg Single  Result Date: 12/28/2015 CLINICAL DATA:  Nonhealing right great toe wound, gangrene, osteomyelitis EXAM: NONINVASIVE PHYSIOLOGIC VASCULAR STUDY OF BILATERAL LOWER EXTREMITIES TECHNIQUE: Evaluation of both lower extremities were performed at rest, including calculation of ankle-brachial indices with single level Doppler, pressure and pulse volume recording. COMPARISON:  None available FINDINGS: Right ABI:  0.58 Left ABI:  0.86 Right Lower Extremity: Irregular monophasic right posterior tibial waveform. Biphasic right dorsalis pedis waveform. Markedly decreased right ABI measuring 0.58 indicative of significant or moderate range right lower extremity peripheral vascular disease. Left Lower Extremity: Biphasic left posterior tibial and dorsalis pedis waveforms. Mildly decreased ABI measuring 0.86. Suspect nonocclusive left lower extremity vascular disease. IMPRESSION: Right ABI 0.58 indicative of moderate range right lower extremity peripheral vascular disease. Minor decrease in the left ABI as above. Electronically Signed   By: Jerilynn Mages.  Shick M.D.   On: 12/28/2015 12:21    [place] Lower Ext Art Right  Result Date: 12/28/2015 CLINICAL DATA:  Right great toe gangrene and osteomyelitis. EXAM: UNILATERAL RIGHT LOWER EXTREMITY ARTERIAL DUPLEX SCAN TECHNIQUE: Gray-scale sonography as well as color Doppler and duplex ultrasound was performed to evaluate the arteries of the lower extremity. COMPARISON:  ABI examination 12/28/2015 FINDINGS: Atherosclerotic calcifications in the right common femoral artery with a monophasic waveform. Deep femoral artery is patent with monophasic waveform. Calcifications and plaque in the right SFA. Monophasic waveforms throughout the right SFA. Peak systolic velocity in the proximal right SFA is 133 cm/sec and the peak systolic velocity in the mid SFA is 221 cm/sec. Atherosclerotic calcifications in the right popliteal artery with monophasic waveform. Monophasic waveform in the right peroneal artery. Monophasic waveform in the right posterior tibial artery. The distal right posterior tibial artery may be occluded. Monophasic waveform  in the anterior tibial artery. Markedly elevated peak systolic velocity in the proximal anterior tibial artery measuring up to 496 cm/sec, this is compatible with stenosis. IMPRESSION: Diffuse atherosclerotic disease throughout the right lower extremity arteries. Right runoff disease demonstrated by stenosis in the right anterior tibial artery and occlusion or minimal flow in the distal posterior tibial artery. At least mild stenosis in the mid right SFA. Monophasic waveforms throughout the right lower extremity may signify underlying inflow disease. Electronically Signed   By: Markus Daft M.D.   On: 12/28/2015 14:41   Dg Chest Port 1 View  Result Date: 12/27/2015 CLINICAL DATA:  Cough.  Right great toe infection. EXAM: PORTABLE CHEST 1 VIEW COMPARISON:  PA and lateral chest 05/14/2009. Single-view of the chest 05/10/2009 and 04/25/2007. FINDINGS: There is cardiomegaly and vascular congestion. No consolidative process,  pneumothorax or effusion. IMPRESSION: Cardiomegaly and pulmonary vascular congestion. Electronically Signed   By: Inge Rise M.D.   On: 12/27/2015 14:48   Dg Foot Complete Right  Result Date: 12/27/2015 CLINICAL DATA:  The patient tried her great toenail her of this week with onset of swelling, wound in bowel odor. The toe is discolored. Initial encounter. EXAM: RIGHT FOOT COMPLETE - 3+ VIEW COMPARISON:  None. FINDINGS: Soft tissues of the great toe are markedly swollen. There is bony destructive change throughout the distal phalanx a comminuted fracture that is transverse in orientation through the metaphysis and extends the articular surface. Bones are osteopenic. No other acute bony abnormality is seen. IMPRESSION: Findings most consistent with osteomyelitis and pathologic fracture of the distal phalanx of the right great toe. Electronically Signed   By: Inge Rise M.D.   On: 12/27/2015 13:17   [place] Saphenous Vein Mapping Right  Result Date: 01/01/2016 CLINICAL DATA:  Right toe gangrene. Please evaluate patency and size of right greater saphenous vein for potential venous bypass. EXAM: RIGHT LOWER EXTREMITY VENOUS MAPPING. TECHNIQUE: Gray-scale sonography with graded compression, as well as color Doppler and duplex ultrasound were performed to evaluate the lower extremity deep venous systems from the level of the common femoral vein and including the common femoral, femoral, profunda femoral, popliteal and calf veins including the posterior tibial, peroneal and gastrocnemius veins when visible. The superficial great saphenous vein was also interrogated. Spectral Doppler was utilized to evaluate flow at rest and with distal augmentation maneuvers in the common femoral, femoral and popliteal veins. COMPARISON:  None. FINDINGS: The greater saphenous vein appears widely patent throughout its imaged course. The greater saphenous vein measures 0.5 cm proximally (image 5), approximately 0.5 cm at the  level of the distal thigh (image 17), 0.4 cm at the level the proximal calf (image 21) and 0.3 cm at the level of the distal calf (image 29). The right common femoral vein  appears widely patent where imaged. IMPRESSION: Wide patency of the right greater saphenous vein with measurements as above. Electronically Signed   By: Sandi Mariscal M.D.   On: 01/01/2016 07:56    Assessment/Plan  #1 hypertension-her systolic blood pressure appears to be rising-per serial checks--now 210-she had complained earlier of dizziness although apparently this has resolved and I'm not commence this is totally new-nonetheless I am concerned about her rising systolic blood pressure which is rising significant higher than what she has been running-neurologically she appears to be stable but will send her to the ER for expedient evaluation and I suspect blood pressure control-I suspect on her return she will need adjustments in her blood pressure medicines.  [id number]  .      Oralia Manis, [place]

## 2016-01-21 NOTE — ED Provider Notes (Signed)
[facility] DEPT Provider Note   CSN: [id number] Arrival date & time: 01/21/16  1206     History   Chief Complaint Chief Complaint  Patient presents with  . Hypertension    HPI [name] is a 83 y.o. female.  Pt presents to the ED today with high blood pressure.  She does have a hx of HTN, but does not have to take much for her bp.  The pt's son said that she's been on coumadin for a.fib and pvd.  However; there is no coumadin listed on her chart.  Since then, she has had trouble with her bp.  The pt denies any sx now, but she did feel dizzy earlier.  Pt was admitted earlier this month for osteomyelitis and pvd.  The pt has been on IV abx through her PICC line.  Pt has also had some recent blister formation to left foot and was seen in the ED at [facility] on 1/16.  She is scheduled for a vascular work up for that leg soon.      Past Medical History:  Diagnosis Date  . Acid reflux   . Carotid artery occlusion    minimal  . Coronary artery disease   . Hypercholesteremia   . Hypertension   . Myocardial infarct   . PAF (paroxysmal atrial fibrillation) ([place])   . Skin disorder   . Systolic murmur   . Thyroid disease     Patient Active Problem List   Diagnosis Date Noted  . Bacteremia   . Osteomyelitis of right foot ([place]) 12/27/2015  . Gangrene of toe of right foot ([place]) 12/27/2015  . Gangrene of foot ([place])   . Toe infection   . CAD (coronary artery disease) 03/26/2013  . HTN (hypertension) 03/26/2013  . Hyperlipidemia 03/26/2013  . Heart murmur 03/26/2013    Past Surgical History:  Procedure Laterality Date  . ABDOMINAL HYSTERECTOMY    . AMPUTATION Right 01/05/2016   Procedure: RIGHT GREAT TOE AMPUTATION;  Surgeon: Serafina Mitchell, MD;  Location: [facility];  Service: Vascular;  Laterality: Right;  . CARDIAC CATHETERIZATION    . CARDIOVERSION  2012  . LOWER EXTREMITY ANGIOGRAM Right 12/30/2015   Procedure: RIGHT LOWER EXTREMITY ANGIOGRAM;  Surgeon: Vickie Epley,  MD;  Location: [facility];  Service: Vascular;  Laterality: Right;  accessed through left groin area - dressed with two 2x2 and 4x4 opsite  . PERIPHERAL VASCULAR CATHETERIZATION N/A 01/04/2016   Procedure: Lower Extremity Angiography;  Surgeon: Serafina Mitchell, MD;  Location: [facility];  Service: Cardiovascular;  Laterality: N/A;  . PERIPHERAL VASCULAR CATHETERIZATION Right 01/04/2016   Procedure: Peripheral Vascular Atherectomy;  Surgeon: Serafina Mitchell, MD;  Location: [place] [facility];  Service: Cardiovascular;  Laterality: Right;  Peroneal  . THYROID SURGERY      OB History    No data available       Home Medications    Prior to Admission medications   Medication Sig Start Date End Date Taking? Authorizing Provider  amiodarone (PACERONE) 200 MG tablet Take 0.5 tablets (100 mg total) by mouth daily. 08/08/12  Yes Terance Ice, MD  aspirin EC 81 MG tablet Take 1 tablet (81 mg total) by mouth daily. 03/26/13  Yes Arnoldo Lenis, MD  atorvastatin (LIPITOR) 20 MG tablet Take 20 mg by mouth every other day. Take 1 tablet (20 mg) by mouth every other night   Yes Historical Provider, MD  ceFAZolin (ANCEF) IVPB Inject 2 g  into the vein every 12 (twelve) hours. Indication:  bacteremia Last Day of Therapy:  01/28/16 Labs - Once weekly:  CBC/D and BMP, Labs - Every other week:  ESR and CRP 01/07/16 01/29/16 Yes Shanker Kristeen Mans, MD  furosemide (LASIX) 20 MG tablet TAKE ONE TABLET BY MOUTH EVERY DAY AS NEEDED FOR SWELLING. 12/22/15  Yes Arnoldo Lenis, MD  levothyroxine (SYNTHROID, LEVOTHROID) 75 MCG tablet Take 75 mcg by mouth daily.     Yes Historical Provider, MD  omeprazole (PRILOSEC) 40 MG capsule Take 40 mg by mouth at bedtime.   Yes Historical Provider, MD  amLODipine (NORVASC) 5 MG tablet Take 1 tablet (5 mg total) by mouth daily. 01/21/16   Isla Pence, MD    Family History Family History  Problem Relation Age of Onset  . Family history unknown: Yes    Social  History Social History  Substance Use Topics  . Smoking status: Never Smoker  . Smokeless tobacco: Never Used  . Alcohol use No     Allergies   Patient has no known allergies.   Review of Systems Review of Systems  Neurological: Positive for dizziness.  All other systems reviewed and are negative.    Physical Exam Updated Vital Signs BP 162/81   Pulse 64   Temp 97.5 F (36.4 C) (Oral)   Resp 12   Ht _0  (1.651 m)   Wt 124 lb (56.2 kg)   SpO2 100%   BMI 20.63 kg/m   Physical Exam  Constitutional: She appears well-developed and well-nourished.  HENT:  Head: Normocephalic and atraumatic.  Right Ear: External ear normal.  Left Ear: External ear normal.  Nose: Nose normal.  Mouth/Throat: Oropharynx is clear and moist.  Eyes: Conjunctivae and EOM are normal. Pupils are equal, round, and reactive to light.  Neck: Normal range of motion. Neck supple.  Cardiovascular: Normal rate, normal heart sounds and intact distal pulses.  An irregularly irregular rhythm present.  Pulmonary/Chest: Effort normal and breath sounds normal.  Abdominal: Soft. Bowel sounds are normal.  Musculoskeletal: Normal range of motion.  Right foot with recent great toe amputation.  Wound looks good.  The left foot does have some blisters and some swelling.  No erythema.    Neurological: She is alert.  Skin: Skin is warm.  Psychiatric: She has a normal mood and affect. Her behavior is normal. Judgment and thought content normal.  Nursing note and vitals reviewed.    ED Treatments / Results  Labs (all labs ordered are listed, but only abnormal results are displayed) Labs Reviewed  BASIC METABOLIC PANEL - Abnormal; Notable for the following:       Result Value   Chloride 99 (*)    BUN 25 (*)    Creatinine, Ser 1.11 (*)    GFR calc non Af Amer 41 (*)    GFR calc Af Amer 48 (*)    All other components within normal limits  CBC WITH DIFFERENTIAL/PLATELET - Abnormal; Notable for the  following:    RBC 3.51 (*)    Hemoglobin 10.9 (*)    HCT 33.7 (*)    All other components within normal limits  URINALYSIS, ROUTINE W REFLEX MICROSCOPIC  PROTIME-INR    EKG  EKG Interpretation  Date/Time:  Friday January 21 2016 16:41:25 EST Ventricular Rate:  62 PR Interval:    QRS Duration: 98 QT Interval:  454 QTC Calculation: 462 R Axis:   84 Text Interpretation:  Sinus rhythm Borderline right axis deviation  Confirmed by [facility] MD, Kutter Schnepf [phone]) on 01/21/2016 5:35:18 PM       Radiology No results found.  Procedures Procedures (including critical care time)  Medications Ordered in ED Medications  cloNIDine (CATAPRES) tablet 0.1 mg (0.1 mg Oral Given 01/21/16 1644)     Initial Impression / Assessment and Plan / ED Course  I have reviewed the triage vital signs and the nursing notes.  Pertinent labs & imaging results that were available during my care of the patient were reviewed by me and considered in my medical decision making (see chart for details).    Pt is on 2.5 mg of norvasc, so I will increase it to 5 mg.  Pt has had no more dizziness while here.  Final Clinical Impressions(s) / ED Diagnoses   Final diagnoses:  Essential hypertension    New Prescriptions Current Discharge Medication List       Isla Pence, MD 01/21/16 [phone]

## 2016-01-21 NOTE — ED Notes (Signed)
Pt reports that he called his physician Dr Sherwood GamblerFusco who sent him here for eval

## 2016-01-22 ENCOUNTER — Encounter (HOSPITAL_COMMUNITY)
Admission: RE | Admit: 2016-01-22 | Discharge: 2016-01-22 | Disposition: A | Discharge status: Home / Self Care | Payer: Medicare Other | Source: Skilled Nursing Facility | Attending: Pediatrics | Admitting: Pediatrics

## 2016-01-22 LAB — SEDIMENTATION RATE: Sed Rate: 32 mm/hr — ABNORMAL HIGH (ref 0–22)

## 2016-01-22 LAB — C-REACTIVE PROTEIN: CRP: 0.9 mg/dL (ref <1.0)

## 2016-01-24 ENCOUNTER — Ambulatory Visit (INDEPENDENT_AMBULATORY_CARE_PROVIDER_SITE_OTHER): Payer: Medicare Other | Admitting: Physician Assistant

## 2016-01-24 ENCOUNTER — Ambulatory Visit (HOSPITAL_COMMUNITY)
Admit: 2016-01-24 | Discharge: 2016-01-24 | Disposition: A | Discharge status: Home / Self Care | Payer: Medicare Other | Attending: Surgery | Admitting: Surgery

## 2016-01-24 ENCOUNTER — Encounter: Payer: Self-pay | Admitting: Physician Assistant

## 2016-01-24 DIAGNOSIS — I739 Peripheral vascular disease, unspecified: Secondary | ICD-10-CM | POA: Diagnosis present

## 2016-01-24 DIAGNOSIS — Z89411 Acquired absence of right great toe: Secondary | ICD-10-CM

## 2016-01-24 DIAGNOSIS — S98111A Complete traumatic amputation of right great toe, initial encounter: Secondary | ICD-10-CM

## 2016-01-24 LAB — VAS US LOWER EXTREMITY ARTERIAL DUPLEX
RATIBDISTSYS: -23 cm/s
RIGHT POST TIB DIST SYS: 131 cm/s
RSFDPSV: -131 cm/s
RSFPPSV: -114 cm/s
Right peroneal sys PSV: 187 cm/s
Right super femoral mid sys PSV: -124 cm/s

## 2016-01-24 NOTE — Progress Notes (Signed)
  POST OPERATIVE OFFICE NOTE    CC:  F/u for surgery  HPI:  This is a 83 y.o. female who is s/p arthrectomy right peroneal artery, angioplasty of the right peroneal artery and tibioperoneal trunk on 01/04/16 and then underwent right great toe amputation on 01/05/16.  She presents today for follow up.  She is residing at [facility] for [facility]  She states that she is walking with a walker and doing well.  She does not have pain.  Her son is concerned about her left foot as it is red, swollen and has a sore on the heel.  She is getting IV abx (Cefazolin) via a PICC line and her last dose is scheduled to be 01/28/16.  No Known Allergies  Current Outpatient Prescriptions  Medication Sig Dispense Refill  . amiodarone (PACERONE) 200 MG tablet Take 0.5 tablets (100 mg total) by mouth daily. 15 tablet 8  . amLODipine (NORVASC) 5 MG tablet Take 1 tablet (5 mg total) by mouth daily. 30 tablet 0  . aspirin EC 81 MG tablet Take 1 tablet (81 mg total) by mouth daily. 90 tablet 3  . atorvastatin (LIPITOR) 20 MG tablet Take 20 mg by mouth every other day. Take 1 tablet (20 mg) by mouth every other night    . ceFAZolin (ANCEF) IVPB Inject 2 g into the vein every 12 (twelve) hours. Indication:  bacteremia Last Day of Therapy:  01/28/16 Labs - Once weekly:  CBC/D and BMP, Labs - Every other week:  ESR and CRP 44 Units 0  . furosemide (LASIX) 20 MG tablet TAKE ONE TABLET BY MOUTH EVERY DAY AS NEEDED FOR SWELLING. 30 tablet 3  . levothyroxine (SYNTHROID, LEVOTHROID) 75 MCG tablet Take 75 mcg by mouth daily.      Marland Kitchen omeprazole (PRILOSEC) 40 MG capsule Take 40 mg by mouth at bedtime.     No current facility-administered medications for this visit.      ROS:  See HPI  Physical Exam:   Incision:  Healing nicely with sutures in place Extremities:  Brisk doppler signals right peroneal, PT, and DP on the right; brisk left PT and monophasic left DP/peroneal.  There is a superficial wound on the left heal as  well as the dorsum of the foot just proximal to the toes.  There is erythema present on the dorsum of the foot. There is edema in the left foot and ankle.    Assessment/Plan:  This is a 83 y.o. female who is s/p:  arthrectomy right peroneal artery, angioplasty of the right peroneal artery and tibioperoneal trunk on 01/04/16 and then underwent right great toe amputation on 01/05/16 by Dr. Trula Slade  -her right great toe amputation is healing nicely & we will remove her stitches today -her left foot is worrisome as she does have a heel wound and a wound on the dorsum of the foot-will plan aortogram with LLE runoff and possible intervention on 01/31/16 by Dr. Trula Slade . -until then, continue IV abx as she is on IV ancef -air boot to left foot and float heels    Leontine Locket, PA-C [facility] [phone]  Clinic MD:  Trula Slade

## 2016-01-25 ENCOUNTER — Encounter (HOSPITAL_COMMUNITY)
Admission: RE | Admit: 2016-01-25 | Discharge: 2016-01-25 | Disposition: A | Discharge status: Home / Self Care | Payer: Medicare Other | Source: Skilled Nursing Facility | Attending: Internal Medicine | Admitting: Internal Medicine

## 2016-01-25 DIAGNOSIS — I5022 Chronic systolic (congestive) heart failure: Secondary | ICD-10-CM | POA: Insufficient documentation

## 2016-01-25 DIAGNOSIS — I739 Peripheral vascular disease, unspecified: Secondary | ICD-10-CM | POA: Insufficient documentation

## 2016-01-25 DIAGNOSIS — K219 Gastro-esophageal reflux disease without esophagitis: Secondary | ICD-10-CM | POA: Insufficient documentation

## 2016-01-25 DIAGNOSIS — M86171 Other acute osteomyelitis, right ankle and foot: Secondary | ICD-10-CM | POA: Insufficient documentation

## 2016-01-25 DIAGNOSIS — M6281 Muscle weakness (generalized): Secondary | ICD-10-CM | POA: Insufficient documentation

## 2016-01-25 DIAGNOSIS — Z792 Long term (current) use of antibiotics: Secondary | ICD-10-CM | POA: Insufficient documentation

## 2016-01-25 LAB — BASIC METABOLIC PANEL
Anion gap: 9 (ref 5–15)
BUN: 27 mg/dL — ABNORMAL HIGH (ref 6–20)
CO2: 26 mmol/L (ref 22–32)
Calcium: 8.5 mg/dL — ABNORMAL LOW (ref 8.9–10.3)
Chloride: 104 mmol/L (ref 101–111)
Creatinine, Ser: 1.19 mg/dL — ABNORMAL HIGH (ref 0.44–1.00)
GFR calc Af Amer: 44 mL/min — ABNORMAL LOW (ref >60)
GFR calc non Af Amer: 38 mL/min — ABNORMAL LOW (ref >60)
GLUCOSE: 89 mg/dL (ref 65–99)
POTASSIUM: 3.9 mmol/L (ref 3.5–5.1)
Sodium: 139 mmol/L (ref 135–145)

## 2016-01-25 LAB — CBC
HEMATOCRIT: 28.7 % — AB (ref 36.0–46.0)
Hemoglobin: 9.3 g/dL — ABNORMAL LOW (ref 12.0–15.0)
MCH: 31.1 pg (ref 26.0–34.0)
MCHC: 32.4 g/dL (ref 30.0–36.0)
MCV: 96 fL (ref 78.0–100.0)
PLATELETS: 286 10*3/uL (ref 150–400)
RBC: 2.99 MIL/uL — AB (ref 3.87–5.11)
RDW: 14.4 % (ref 11.5–15.5)
WBC: 3.9 10*3/uL — ABNORMAL LOW (ref 4.0–10.5)

## 2016-01-26 ENCOUNTER — Other Ambulatory Visit: Payer: Self-pay | Admitting: *Deleted

## 2016-01-27 ENCOUNTER — Other Ambulatory Visit (HOSPITAL_COMMUNITY)
Admission: AD | Admit: 2016-01-27 | Discharge: 2016-01-27 | Disposition: A | Discharge status: Home / Self Care | Payer: Medicare Other | Source: Skilled Nursing Facility | Attending: Internal Medicine | Admitting: Internal Medicine

## 2016-01-27 DIAGNOSIS — R7881 Bacteremia: Secondary | ICD-10-CM | POA: Insufficient documentation

## 2016-01-27 LAB — CBC WITH DIFFERENTIAL/PLATELET
Basophils Absolute: 0 10*3/uL (ref 0.0–0.1)
Basophils Relative: 1 %
Eosinophils Absolute: 0.3 10*3/uL (ref 0.0–0.7)
Eosinophils Relative: 4 %
HCT: 32.5 % — ABNORMAL LOW (ref 36.0–46.0)
Hemoglobin: 10.5 g/dL — ABNORMAL LOW (ref 12.0–15.0)
Lymphocytes Relative: 16 %
Lymphs Abs: 1.1 10*3/uL (ref 0.7–4.0)
MCH: 31.3 pg (ref 26.0–34.0)
MCHC: 32.3 g/dL (ref 30.0–36.0)
MCV: 97 fL (ref 78.0–100.0)
Monocytes Absolute: 0.7 10*3/uL (ref 0.1–1.0)
Monocytes Relative: 10 %
Neutro Abs: 4.8 10*3/uL (ref 1.7–7.7)
Neutrophils Relative %: 69 %
Platelets: 355 10*3/uL (ref 150–400)
RBC: 3.35 MIL/uL — ABNORMAL LOW (ref 3.87–5.11)
RDW: 14.2 % (ref 11.5–15.5)
WBC: 7 10*3/uL (ref 4.0–10.5)

## 2016-01-27 LAB — BASIC METABOLIC PANEL WITH GFR
Anion gap: 8 (ref 5–15)
BUN: 24 mg/dL — ABNORMAL HIGH (ref 6–20)
CO2: 27 mmol/L (ref 22–32)
Calcium: 8.8 mg/dL — ABNORMAL LOW (ref 8.9–10.3)
Chloride: 103 mmol/L (ref 101–111)
Creatinine, Ser: 1.3 mg/dL — ABNORMAL HIGH (ref 0.44–1.00)
GFR calc Af Amer: 40 mL/min — ABNORMAL LOW
GFR calc non Af Amer: 34 mL/min — ABNORMAL LOW
Glucose, Bld: 90 mg/dL (ref 65–99)
Potassium: 4.2 mmol/L (ref 3.5–5.1)
Sodium: 138 mmol/L (ref 135–145)

## 2016-01-28 ENCOUNTER — Encounter (HOSPITAL_COMMUNITY)
Admission: RE | Admit: 2016-01-28 | Discharge: 2016-01-28 | Disposition: A | Discharge status: Home / Self Care | Payer: Medicare Other | Source: Skilled Nursing Facility | Attending: Internal Medicine | Admitting: Internal Medicine

## 2016-01-28 DIAGNOSIS — K219 Gastro-esophageal reflux disease without esophagitis: Secondary | ICD-10-CM | POA: Insufficient documentation

## 2016-01-28 DIAGNOSIS — M86171 Other acute osteomyelitis, right ankle and foot: Secondary | ICD-10-CM | POA: Insufficient documentation

## 2016-01-28 DIAGNOSIS — I739 Peripheral vascular disease, unspecified: Secondary | ICD-10-CM | POA: Insufficient documentation

## 2016-01-28 DIAGNOSIS — M6281 Muscle weakness (generalized): Secondary | ICD-10-CM | POA: Insufficient documentation

## 2016-01-28 DIAGNOSIS — I5022 Chronic systolic (congestive) heart failure: Secondary | ICD-10-CM | POA: Insufficient documentation

## 2016-01-29 ENCOUNTER — Encounter (HOSPITAL_COMMUNITY)
Admission: RE | Admit: 2016-01-29 | Discharge: 2016-01-29 | Disposition: A | Discharge status: Home / Self Care | Payer: Medicare Other

## 2016-01-29 LAB — OCCULT BLOOD X 1 CARD TO LAB, STOOL: Fecal Occult Bld: NEGATIVE

## 2016-01-31 ENCOUNTER — Ambulatory Visit (HOSPITAL_COMMUNITY): Payer: Medicare Other | Attending: Adult Health | Admitting: Physical Therapy

## 2016-01-31 DIAGNOSIS — R262 Difficulty in walking, not elsewhere classified: Secondary | ICD-10-CM

## 2016-01-31 DIAGNOSIS — S91302A Unspecified open wound, left foot, initial encounter: Secondary | ICD-10-CM

## 2016-01-31 NOTE — Therapy (Signed)
[facility] [address] [place], [place], [address] Phone: [phone]   Fax:  [phone]  Wound Care Evaluation  Patient Details  Name: [name] MRN: [id number] Date of Birth: 06-30-1920 Referring Provider: Assunta Found   Encounter Date: 01/31/2016      PT End of Session - 01/31/16 1610    Visit Number 1   Number of Visits 1   PT Start Time 1530   PT Stop Time 1600   PT Time Calculation (min) 30 min   Activity Tolerance Patient tolerated treatment well   Behavior During Therapy [facility] for tasks assessed/performed      Past Medical History:  Diagnosis Date  . Acid reflux   . Carotid artery occlusion    minimal  . Coronary artery disease   . Hypercholesteremia   . Hypertension   . Myocardial infarct   . PAF (paroxysmal atrial fibrillation) ([facility])   . Skin disorder   . Systolic murmur   . Thyroid disease     Past Surgical History:  Procedure Laterality Date  . ABDOMINAL HYSTERECTOMY    . AMPUTATION Right 01/05/2016   Procedure: RIGHT GREAT TOE AMPUTATION;  Surgeon: Nada Libman, MD;  Location: [facility] OR;  Service: Vascular;  Laterality: Right;  . CARDIAC CATHETERIZATION    . CARDIOVERSION  2012  . LOWER EXTREMITY ANGIOGRAM Right 12/30/2015   Procedure: RIGHT LOWER EXTREMITY ANGIOGRAM;  Surgeon: Ancil Linsey, MD;  Location[facility];  Service: Vascular;  Laterality: Right;  accessed through left groin area - dressed with two 2x2 and 4x4 opsite  . PERIPHERAL VASCULAR CATHETERIZATION N/A 01/04/2016   Procedure: Lower Extremity Angiography;  Surgeon: Nada Libman, MD;  Location: [facility] INVASIVE CV LAB;  Service: Cardiovascular;  Laterality: N/A;  . PERIPHERAL VASCULAR CATHETERIZATION Right 01/04/2016   Procedure: Peripheral Vascular Atherectomy;  Surgeon: Nada Libman, MD;  Location: [facility] INVAS[facility] CV LAB;  Service: Cardiovascular;  Laterality: Right;  Peroneal  . THYROID SURGERY      There were no vitals filed for this  visit.      Subjective Assessment - 01/31/16 1601    Subjective Pt states that she is at the [facility] due to an amputation of her Rt great toe but expects to return home. She is here for evaluation of her LT hee.    Pertinent History angioplasty of Rt peroneal artery on 01/04/2016 with great toe ampuation on 01/05/2016.  Pt has had IV antibiotics that ended on 01/28/2016.     How long can you sit comfortably? no problem    How long can you stand comfortably? not long   How long can you walk comfortably? not long    Currently in Pain? No/denies          [facility] PT Assessment - 01/31/16 0001      Assessment   Medical Diagnosis Lt heel wound    Referring Provider Assunta Found    Onset Date/Surgical Date 12/06/15   Next MD Visit unknown   Prior Therapy not for this      Precautions   Precautions None     Restrictions   Weight Bearing Restrictions No     Balance Screen   Has the patient fallen in the past 6 months No   Has the patient had a decrease in activity level because of a fear of falling?  No   Is the patient reluctant to leave their home because of a fear of falling?  No     Home Environment   Living Environment --  resides at [facility]         Wound Therapy - 02/23/16 1603    Subjective Pt states that she is not having any pain.  She is going for a vascular study of her Lt LE tomorrow.    Patient and Family Stated Goals all wounds to heal    Pain Assessment No/denies pain   Evaluation and Treatment Procedures Explained to Patient/Family Yes   Evaluation and Treatment Procedures agreed to   Wound Therapy - Clinical Statement Pt has two wound areas on her Lt foot.  The first is an area of 3.8 diameter on her heel which is a stage I wound with dead skin covering 1/4 of the wound.   The therapist debrided this area.  The second is located on the dorsal aspect of her left food along her second and third metatarsal heads measuring  2.8 cm x .7 cm with no granulation  initially, however, after debridement the area was 75% granulated,  The thearapist recommend that  both wounds can be treated by the nursing staff at the [facility].    Wound Therapy - Functional Problem List difficulty to walk   Factors Delaying/Impairing Wound Healing Altered sensation;Infection - systemic/local;Immobility;Polypharmacy;Vascular compromise   Hydrotherapy Plan Debridement;Dressing change;Patient/family education   Wound Therapy - Current Recommendations Other (comment)   Wound Therapy - Follow Up Recommendations Skilled nursing facility   Dressing  xeroform followed by 4x4 and kling.                            PT Short Term Goals - 2016/02/23 1612      PT SHORT TERM GOAL #1   Title Wound to be 75% granulated to reduce risk of infection and allow nursing staff to dress wound.    Time 1   Period Days   Status Achieved                 Plan - 02-23-2016 1610    Clinical Impression Statement Discharge pt back to [facility] for nursing to complete dressing changes    PT Frequency 1x / week   PT Duration --  1 week   PT Treatment/Interventions --  Therapist debrided needed areas nursing can now continue with dressing changes.   PT Next Visit Plan one time visit only    Consulted and Agree with Plan of Care Patient;Family member/caregiver      Patient will benefit from skilled therapeutic intervention in order to improve the following deficits and impairments:     Visit Diagnosis: Difficulty in walking, not elsewhere classified  Non healing left heel wound      G-Codes - 02-23-16 1615    Functional Assessment Tool Used clinical judgement: granulation    Functional Limitation Other PT primary   Other PT Primary Current Status (Z6109) At least 1 percent but less than 20 percent impaired, limited or restricted   Other PT Primary Goal Status (U0454) At least 1 percent but less than 20 percent impaired, limited or restricted   Other PT  Primary Discharge Status (U9811) At least 1 percent but less than 20 percent impaired, limited or restricted      Problem List Patient Active Problem List   Diagnosis Date Noted  . Bacteremia   . Osteomyelitis of right foot ([facility]) 12/27/2015  . Gangrene of toe of right foot ([facility]) 12/27/2015  .  Gangrene of foot ([facility])   . Toe infection   . CAD (coronary artery disease) 03/26/2013  . HTN (hypertension) 03/26/2013  . Hyperlipidemia 03/26/2013  . Heart murmur 03/26/2013    Virgina Organynthia Russell, PT CLT [phone] 01/31/2016, 4:16 PM  [facility] [address] [place], [place], [address] Phone: [phone]   Fax:  [phone]  Name: Nanine MeansMable M Laura MRN: [id number] Date of Birth: 06/19/1920

## 2016-02-01 ENCOUNTER — Encounter (HOSPITAL_COMMUNITY)
Admission: RE | Disposition: A | Discharge status: Home / Self Care | Payer: Self-pay | Source: Ambulatory Visit | Attending: Surgery

## 2016-02-01 ENCOUNTER — Ambulatory Visit (HOSPITAL_COMMUNITY)
Admission: RE | Admit: 2016-02-01 | Discharge: 2016-02-02 | Disposition: A | Discharge status: Home / Self Care | Payer: Medicare Other | Source: Ambulatory Visit | Attending: Surgery | Admitting: Surgery

## 2016-02-01 DIAGNOSIS — Z89411 Acquired absence of right great toe: Secondary | ICD-10-CM | POA: Diagnosis not present

## 2016-02-01 DIAGNOSIS — Z7982 Long term (current) use of aspirin: Secondary | ICD-10-CM | POA: Diagnosis not present

## 2016-02-01 DIAGNOSIS — I779 Disorder of arteries and arterioles, unspecified: Secondary | ICD-10-CM | POA: Diagnosis present

## 2016-02-01 DIAGNOSIS — L97429 Non-pressure chronic ulcer of left heel and midfoot with unspecified severity: Secondary | ICD-10-CM | POA: Diagnosis not present

## 2016-02-01 DIAGNOSIS — I70244 Atherosclerosis of native arteries of left leg with ulceration of heel and midfoot: Secondary | ICD-10-CM | POA: Insufficient documentation

## 2016-02-01 DIAGNOSIS — I700 Atherosclerosis of aorta: Secondary | ICD-10-CM | POA: Insufficient documentation

## 2016-02-01 HISTORY — PX: PERIPHERAL VASCULAR CATHETERIZATION: SHX172C

## 2016-02-01 LAB — POCT I-STAT, CHEM 8
BUN: 24 mg/dL — ABNORMAL HIGH (ref 6–20)
CHLORIDE: 103 mmol/L (ref 101–111)
CREATININE: 1.2 mg/dL — AB (ref 0.44–1.00)
Calcium, Ion: 1.25 mmol/L (ref 1.15–1.40)
GLUCOSE: 88 mg/dL (ref 65–99)
HCT: 32 % — ABNORMAL LOW (ref 36.0–46.0)
Hemoglobin: 10.9 g/dL — ABNORMAL LOW (ref 12.0–15.0)
POTASSIUM: 4.3 mmol/L (ref 3.5–5.1)
Sodium: 142 mmol/L (ref 135–145)
TCO2: 29 mmol/L (ref 0–100)

## 2016-02-01 LAB — POCT ACTIVATED CLOTTING TIME
ACTIVATED CLOTTING TIME: 219 s
ACTIVATED CLOTTING TIME: 202 s
ACTIVATED CLOTTING TIME: 164 s

## 2016-02-01 SURGERY — ABDOMINAL AORTOGRAM W/LOWER EXTREMITY
Anesthesia: LOCAL

## 2016-02-01 MED ORDER — SODIUM CHLORIDE 0.9 % IV SOLN
INTRAVENOUS | Status: DC
  Administered 2016-02-01: 15:00:00 via INTRAVENOUS

## 2016-02-01 MED ORDER — AMIODARONE HCL 200 MG PO TABS
100.0000 mg | ORAL_TABLET | Freq: Every day | ORAL | Status: DC
  Administered 2016-02-02: 100 mg via ORAL
  Filled 2016-02-01: qty 1

## 2016-02-01 MED ORDER — SODIUM CHLORIDE 0.9 % IV SOLN
INTRAVENOUS | Status: DC
  Administered 2016-02-01: 18:00:00 via INTRAVENOUS

## 2016-02-01 MED ORDER — SODIUM CHLORIDE 0.9% FLUSH: 10.0000 mL | INTRAVENOUS | Status: DC | PRN

## 2016-02-01 MED ORDER — LIDOCAINE HCL (PF) 1 % IJ SOLN
INTRAMUSCULAR | Status: AC
  Filled 2016-02-01: qty 30

## 2016-02-01 MED ORDER — ACETAMINOPHEN 650 MG RE SUPP: 325.0000 mg | RECTAL | Status: DC | PRN

## 2016-02-01 MED ORDER — PANTOPRAZOLE SODIUM 40 MG PO TBEC
40.0000 mg | DELAYED_RELEASE_TABLET | Freq: Every day | ORAL | Status: DC
  Administered 2016-02-02: 40 mg via ORAL
  Filled 2016-02-01: qty 1

## 2016-02-01 MED ORDER — VIPERSLIDE LUBRICANT OPTIME
TOPICAL | Status: DC | PRN
  Administered 2016-02-01: 16:00:00 via SURGICAL_CAVITY

## 2016-02-01 MED ORDER — ALUM & MAG HYDROXIDE-SIMETH 200-200-20 MG/5ML PO SUSP: 15.0000 mL | ORAL | Status: DC | PRN

## 2016-02-01 MED ORDER — HEPARIN (PORCINE) IN NACL 2-0.9 UNIT/ML-% IJ SOLN
INTRAMUSCULAR | Status: AC
  Filled 2016-02-01: qty 1000

## 2016-02-01 MED ORDER — HEPARIN SODIUM (PORCINE) 1000 UNIT/ML IJ SOLN
INTRAMUSCULAR | Status: AC
  Filled 2016-02-01: qty 1

## 2016-02-01 MED ORDER — GUAIFENESIN-DM 100-10 MG/5ML PO SYRP: 15.0000 mL | ORAL_SOLUTION | ORAL | Status: DC | PRN

## 2016-02-01 MED ORDER — HEPARIN SODIUM (PORCINE) 1000 UNIT/ML IJ SOLN
INTRAMUSCULAR | Status: DC | PRN
  Administered 2016-02-01: 1000 [IU] via INTRAVENOUS
  Administered 2016-02-01: 6000 [IU] via INTRAVENOUS

## 2016-02-01 MED ORDER — DOCUSATE SODIUM 100 MG PO CAPS
100.0000 mg | ORAL_CAPSULE | Freq: Every day | ORAL | Status: DC
  Administered 2016-02-02: 09:00:00 100 mg via ORAL
  Filled 2016-02-01: qty 1

## 2016-02-01 MED ORDER — ATORVASTATIN CALCIUM 20 MG PO TABS
20.0000 mg | ORAL_TABLET | ORAL | Status: DC
  Administered 2016-02-02: 09:00:00 20 mg via ORAL
  Filled 2016-02-01: qty 1

## 2016-02-01 MED ORDER — AMLODIPINE BESYLATE 5 MG PO TABS
5.0000 mg | ORAL_TABLET | Freq: Every day | ORAL | Status: DC
  Administered 2016-02-02: 09:00:00 5 mg via ORAL
  Filled 2016-02-01: qty 1

## 2016-02-01 MED ORDER — ACETAMINOPHEN 325 MG PO TABS: 325.0000 mg | ORAL_TABLET | ORAL | Status: DC | PRN

## 2016-02-01 MED ORDER — MORPHINE SULFATE (PF) 2 MG/ML IV SOLN: 2.0000 mg | INTRAVENOUS | Status: DC | PRN

## 2016-02-01 MED ORDER — HEPARIN (PORCINE) IN NACL 2-0.9 UNIT/ML-% IJ SOLN
INTRAMUSCULAR | Status: DC | PRN
  Administered 2016-02-01: 1000 mL

## 2016-02-01 MED ORDER — ASPIRIN EC 81 MG PO TBEC
81.0000 mg | DELAYED_RELEASE_TABLET | Freq: Every day | ORAL | Status: DC
  Administered 2016-02-02: 09:00:00 81 mg via ORAL
  Filled 2016-02-01: qty 1

## 2016-02-01 MED ORDER — METOPROLOL TARTRATE 5 MG/5ML IV SOLN: 2.0000 mg | INTRAVENOUS | Status: DC | PRN

## 2016-02-01 MED ORDER — ONDANSETRON HCL 4 MG/2ML IJ SOLN: 4.0000 mg | Freq: Four times a day (QID) | INTRAMUSCULAR | Status: DC | PRN

## 2016-02-01 MED ORDER — LIDOCAINE HCL (PF) 1 % IJ SOLN
INTRAMUSCULAR | Status: DC | PRN
  Administered 2016-02-01: 12 mL

## 2016-02-01 MED ORDER — IODIXANOL 320 MG/ML IV SOLN
INTRAVENOUS | Status: DC | PRN
  Administered 2016-02-01: 115 mL via INTRA_ARTERIAL

## 2016-02-01 MED ORDER — LEVOTHYROXINE SODIUM 75 MCG PO TABS
75.0000 ug | ORAL_TABLET | Freq: Every day | ORAL | Status: DC
  Administered 2016-02-02: 75 ug via ORAL
  Filled 2016-02-01: qty 1

## 2016-02-01 MED ORDER — PHENOL 1.4 % MT LIQD
1.0000 | OROMUCOSAL | Status: DC | PRN
  Filled 2016-02-01: qty 177

## 2016-02-01 MED ORDER — LABETALOL HCL 5 MG/ML IV SOLN
10.0000 mg | INTRAVENOUS | Status: DC | PRN
  Administered 2016-02-01: 10 mg via INTRAVENOUS
  Filled 2016-02-01: qty 4

## 2016-02-01 MED ORDER — HYDRALAZINE HCL 20 MG/ML IJ SOLN
5.0000 mg | INTRAMUSCULAR | Status: DC | PRN
  Administered 2016-02-01: 5 mg via INTRAVENOUS
  Filled 2016-02-01: qty 1

## 2016-02-01 MED ORDER — OXYCODONE HCL 5 MG PO TABS: 5.0000 mg | ORAL_TABLET | ORAL | Status: DC | PRN

## 2016-02-01 SURGICAL SUPPLY — 28 items
BALLN COYOTE OTW 3X100X150 (BALLOONS) ×3
BALLOON COYOTE OTW 3X100X150 (BALLOONS) ×2 IMPLANT
CATH OMNI FLUSH 5F 65CM (CATHETERS) ×3 IMPLANT
CATH QUICKCROSS .018X135CM (MICROCATHETER) ×3 IMPLANT
COVER PRB 48X5XTLSCP FOLD TPE (BAG) ×4 IMPLANT
COVER PROBE 5X48 (BAG) ×2
CROWN STEALTH MICRO-30 1.25MM (CATHETERS) ×3 IMPLANT
DEVICE TORQUE H2O (MISCELLANEOUS) ×3 IMPLANT
DRAPE ZERO GRAVITY STERILE (DRAPES) ×3 IMPLANT
GUIDEWIRE ANGLED .035X150CM (WIRE) ×3 IMPLANT
GUIDEWIRE STR TIP .014X300X8 (WIRE) ×3 IMPLANT
HOVERMATT SINGLE USE (MISCELLANEOUS) ×3 IMPLANT
KIT ENCORE 26 ADVANTAGE (KITS) ×3 IMPLANT
KIT PV (KITS) ×3 IMPLANT
LUBRICANT VIPERSLIDE CORONARY (MISCELLANEOUS) ×3 IMPLANT
SHEATH PINNACLE 5F 10CM (SHEATH) ×3 IMPLANT
SHEATH PINNACLE 6F 10CM (SHEATH) ×3 IMPLANT
SHEATH PINNACLE ST 6F 65CM (SHEATH) ×3 IMPLANT
SHIELD RADPAD SCOOP 12X17 (MISCELLANEOUS) ×3 IMPLANT
SYR MEDRAD MARK V 150ML (SYRINGE) ×3 IMPLANT
TAPE VIPERTRACK RADIOPAQ (MISCELLANEOUS) ×2 IMPLANT
TAPE VIPERTRACK RADIOPAQUE (MISCELLANEOUS) ×1
TRANSDUCER W/STOPCOCK (MISCELLANEOUS) ×3 IMPLANT
TRAY PV CATH (CUSTOM PROCEDURE TRAY) ×3 IMPLANT
WIRE BENTSON .035X145CM (WIRE) ×3 IMPLANT
WIRE MINI STICK MAX (SHEATH) ×3 IMPLANT
WIRE SPARTACORE .014X300CM (WIRE) ×3 IMPLANT
WIRE VIPER ADVANCE .017X335CM (WIRE) ×3 IMPLANT

## 2016-02-01 NOTE — Op Note (Signed)
Patient name: Maria MeansMable M Zimbelman MRN: [id number] DOB: 08/06/1920 Sex: female  02/01/2016 Pre-operative Diagnosis: left leg ulcer Post-operative diagnosis:  Same Surgeon:  Durene CalBrabham, [place] Procedure Performed:  1.  Ultrasound-guided access, right femoral artery  2.  Abdominal aortogram  3.  Left lower extremity runoff  4.  Atherectomy, left tibioperoneal trunk  5.  Atherectomy, left peroneal artery  6.  Angioplasty, left tibioperoneal trunk  7.  Angioplasty, left peroneal artery   Indications:  The patient previously has undergone percutaneous intervention to the right tibial vessels for a ulcerated right great toe which ultimately was amputated.  She now presents with a new heel wound on the left and is here for further evaluation.  Procedure:  The patient was identified in the holding area and taken to room 8.  The patient was then placed supine on the table and prepped and draped in the usual sterile fashion.  A time out was called.  Ultrasound was used to evaluate the right common femoral artery.  It was patent .  A digital ultrasound image was acquired.  A micropuncture needle was used to access the right common femoral artery under ultrasound guidance.  An 018 wire was advanced without resistance and a micropuncture sheath was placed.  The 018 wire was removed and a benson wire was placed.  The micropuncture sheath was exchanged for a 5 french sheath.  An omniflush catheter was advanced over the wire to the level of L-1.  An abdominal angiogram was obtained.  Next, using the omniflush catheter and a benson wire, the aortic bifurcation was crossed and the catheter was placed into theleft external iliac artery and left runoff was obtained.    Findings:   Aortogram:  No significant renal artery stenosis.  The infrarenal abdominal aorta is calcified but patent.  Bilateral common and external iliac arteries are widely patent  Right Lower Extremity:  Not evaluated  Left Lower Extremity:  Left  common femoral profunda femoral and superficial femoral artery are widely patent.  The popliteal artery is widely patent.  There is a high grade calcified stenosis within the tibioperoneal trunk approximately 90%.  The posterior tibial and anterior tibial arteries are occluded.  The peroneal artery is patent however approximately 10 cm beyond its origin there is a 95% stenosis.  The peroneal artery perfuses the foot.  Intervention:  After the above images were acquired the decision was made to proceed with intervention.  A 6 French 65 cm sheath was advanced into the left superficial femoral artery.  The patient was fully heparinized.  Using a 018 quick cross catheter and a V 14 wire followed by [place] core wire, the lesions within the tibioperoneal trunk and the peroneal artery were able to be crossed.  Contrast injections were performed to the quick cross catheter in the distal peroneal artery, confirming successful crossing of the lesions.  Next a Viper wire was inserted.  The CSI micro-device was used to perform atherectomy of the tibioperoneal trunk lesion as well as the peroneal artery lesion.  This was done at low medium and high speed.  Next a 3 x 100 balloon was used to perform balloon angioplasty of the atherectomized area and the peroneal artery.  This was taken to nominal pressure for 2 minutes.  The balloon was then withdrawn to the tibioperoneal trunk and balloon angioplasty was performed of the atherectomized area within the tibioperoneal trunk.  This was taken to nominal pressure for 2 minutes.  Completion imaging shows complete  resolution of the stenosis within the tibioperoneal trunk and peroneal artery.  At this point the decision was made to terminate the procedure.  Catheters and wires were removed.  The long 6 French sheath was exchanged out for a short 6 [place] sheath.  The patient be taken the holding area for sheath pull once her coagulation profile corrects.  Impression:  #1  heavily  calcified 95% stenosis of the tibioperoneal trunk successfully treated with atherectomy and angioplasty using a CSI microdevice and a 3 mm balloon with residual stenosis of 0%  #2  high-grade, 95% focal lesion within the peroneal artery successfully treated with atherectomy and angioplasty using a CSI micro-catheter and a 3 mm balloon with residual stenosis of 0%  V. Durene Cal, M.D. [facility] [place] Office: [phone] Pager:  [phone]

## 2016-02-02 ENCOUNTER — Encounter (HOSPITAL_COMMUNITY): Payer: Self-pay | Admitting: Surgery

## 2016-02-02 ENCOUNTER — Inpatient Hospital Stay
Admission: RE | Admit: 2016-02-02 | Discharge: 2017-10-31 | Disposition: A | Discharge status: Other Acute Inpatient Hospital | Payer: Medicare Other | Source: Ambulatory Visit | Attending: Internal Medicine | Admitting: Internal Medicine

## 2016-02-02 ENCOUNTER — Other Ambulatory Visit: Payer: Self-pay | Admitting: *Deleted

## 2016-02-02 DIAGNOSIS — I739 Peripheral vascular disease, unspecified: Secondary | ICD-10-CM

## 2016-02-02 DIAGNOSIS — I70244 Atherosclerosis of native arteries of left leg with ulceration of heel and midfoot: Secondary | ICD-10-CM | POA: Diagnosis not present

## 2016-02-02 DIAGNOSIS — Z9862 Peripheral vascular angioplasty status: Secondary | ICD-10-CM

## 2016-02-02 NOTE — Care Management Note (Signed)
Case Management Note  Patient Details  Name: [name] MRN: [id number] Date of Birth: 06/06/1920  Subjective/Objective:   S/p pv intervention, fofr dc today, no needs.                 Action/Plan:   Expected Discharge Date:  02/02/16               Expected Discharge Plan:  Home/Self Care  In-House Referral:     Discharge planning Services  CM Consult  Post Acute Care Choice:    Choice offered to:     DME Arranged:    DME Agency:     HH Arranged:    HH Agency:     Status of Service:  Completed, signed off  If discussed at [employer] Length of Stay Meetings, dates discussed:    Additional Comments:  [name], [name] [name], RN 02/02/2016, 9:17 AM

## 2016-02-02 NOTE — Interval H&P Note (Signed)
History and Physical Interval Note:  02/02/2016 9:09 PM  [name]  has presented today for surgery, with the diagnosis of left heel ulcer  The various methods of treatment have been discussed with the patient and family. After consideration of risks, benefits and other options for treatment, the patient has consented to  Procedure(s) with comments: Abdominal Aortogram w/Lower Extremity (N/A) Peripheral Vascular Atherectomy - Left  as a surgical intervention .  The patient's history has been reviewed, patient examined, no change in status, stable for surgery.  I have reviewed the patient's chart and labs.  Questions were answered to the patient's satisfaction.     Durene CalBrabham, [place]

## 2016-02-02 NOTE — H&P (View-Only) (Signed)
  POST OPERATIVE OFFICE NOTE    CC:  F/u for surgery  HPI:  This is a 83 y.o. female who is s/p arthrectomy right peroneal artery, angioplasty of the right peroneal artery and tibioperoneal trunk on 01/04/16 and then underwent right great toe amputation on 01/05/16.  She presents today for follow up.  She is residing at [facility] for 4 weeks  She states that she is walking with a walker and doing well.  She does not have pain.  Her son is concerned about her left foot as it is red, swollen and has a sore on the heel.  She is getting IV abx (Cefazolin) via a PICC line and her last dose is scheduled to be 01/28/16.  No Known Allergies  Current Outpatient Prescriptions  Medication Sig Dispense Refill  . amiodarone (PACERONE) 200 MG tablet Take 0.5 tablets (100 mg total) by mouth daily. 15 tablet 8  . amLODipine (NORVASC) 5 MG tablet Take 1 tablet (5 mg total) by mouth daily. 30 tablet 0  . aspirin EC 81 MG tablet Take 1 tablet (81 mg total) by mouth daily. 90 tablet 3  . atorvastatin (LIPITOR) 20 MG tablet Take 20 mg by mouth every other day. Take 1 tablet (20 mg) by mouth every other night    . ceFAZolin (ANCEF) IVPB Inject 2 g into the vein every 12 (twelve) hours. Indication:  bacteremia Last Day of Therapy:  01/28/16 Labs - Once weekly:  CBC/D and BMP, Labs - Every other week:  ESR and CRP 44 Units 0  . furosemide (LASIX) 20 MG tablet TAKE ONE TABLET BY MOUTH EVERY DAY AS NEEDED FOR SWELLING. 30 tablet 3  . levothyroxine (SYNTHROID, LEVOTHROID) 75 MCG tablet Take 75 mcg by mouth daily.      Marland Kitchen omeprazole (PRILOSEC) 40 MG capsule Take 40 mg by mouth at bedtime.     No current facility-administered medications for this visit.      ROS:  See HPI  Physical Exam:   Incision:  Healing nicely with sutures in place Extremities:  Brisk doppler signals right peroneal, PT, and DP on the right; brisk left PT and monophasic left DP/peroneal.  There is a superficial wound on the left heal as  well as the dorsum of the foot just proximal to the toes.  There is erythema present on the dorsum of the foot. There is edema in the left foot and ankle.    Assessment/Plan:  This is a 83 y.o. female who is s/p:  arthrectomy right peroneal artery, angioplasty of the right peroneal artery and tibioperoneal trunk on 01/04/16 and then underwent right great toe amputation on 01/05/16 by Dr. Trula Slade  -her right great toe amputation is healing nicely & we will remove her stitches today -her left foot is worrisome as she does have a heel wound and a wound on the dorsum of the foot-will plan aortogram with LLE runoff and possible intervention on 01/31/16 by Dr. Trula Slade . -until then, continue IV abx as she is on IV ancef -air boot to left foot and float heels    Leontine Locket, PA-C [facility] [phone]  Clinic MD:  Trula Slade

## 2016-02-02 NOTE — Progress Notes (Signed)
[facility] [place]  Subjective  - Doing well.   Objective (!) 155/41 62 97.5 F (36.4 C) (Oral) (!) 23 98%  Intake/Output Summary (Last 24 hours) at 02/02/16 0853 Last data filed at 02/02/16 0500  Gross per 24 hour  Intake              345 ml  Output              600 ml  Net             -255 ml    Right groin soft with out hematoma.  Assessment/Planning: POD # 1 Procedure Performed:             1.  Ultrasound-guided access, right femoral artery             2.  Abdominal aortogram             3.  Left lower extremity runoff             4.  Atherectomy, left tibioperoneal trunk             5.  Atherectomy, left peroneal artery             6.  Angioplasty, left tibioperoneal trunk             7.  Angioplasty, left peroneal artery  Findings:              Aortogram:  No significant renal artery stenosis.  The infrarenal abdominal aorta is calcified but patent.  Bilateral common and external iliac arteries are widely patent             Right Lower Extremity:  Not evaluated             Left Lower Extremity:  Left common femoral profunda femoral and superficial femoral artery are widely patent.  The popliteal artery is widely patent.  There is a high grade calcified stenosis within the tibioperoneal trunk approximately 90%.  The posterior tibial and anterior tibial arteries are occluded.  The peroneal artery is patent however approximately 10 cm beyond its origin there is a 95% stenosis.  The peroneal artery perfuses the foot.   Impression:             #1  heavily calcified 95% stenosis of the tibioperoneal trunk successfully treated with atherectomy and angioplasty using a CSI microdevice and a 3 mm balloon with residual stenosis of 0%             #2  high-grade, 95% focal lesion within the peroneal artery successfully treated with atherectomy and angioplasty using a CSI micro-catheter and a 3 mm balloon with residual stenosis of 0%   Discharge home     [name] GallantCOLLINS, Maria [facility] CenterMAUREEN 02/02/2016 8:53 AM --  Laboratory Lab Results:  Recent Labs  02/01/16 1344  HGB 10.9*  HCT 32.0*   BMET  Recent Labs  02/01/16 1344  NA 142  K 4.3  CL 103  GLUCOSE 88  BUN 24*  CREATININE 1.20*    COAG Lab Results  Component Value Date   INR 1.02 01/21/2016   INR 1.07 01/05/2016   INR 1.18 01/04/2016   No results found for: PTT

## 2016-02-03 ENCOUNTER — Other Ambulatory Visit (HOSPITAL_COMMUNITY)
Admission: RE | Admit: 2016-02-03 | Discharge: 2016-02-03 | Disposition: A | Discharge status: Home / Self Care | Payer: Medicare Other | Source: Skilled Nursing Facility | Attending: Internal Medicine | Admitting: Internal Medicine

## 2016-02-03 DIAGNOSIS — I10 Essential (primary) hypertension: Secondary | ICD-10-CM | POA: Insufficient documentation

## 2016-02-03 LAB — CBC WITH DIFFERENTIAL/PLATELET
Basophils Absolute: 0 10*3/uL (ref 0.0–0.1)
Basophils Relative: 1 %
EOS PCT: 5 %
Eosinophils Absolute: 0.3 10*3/uL (ref 0.0–0.7)
HCT: 30.8 % — ABNORMAL LOW (ref 36.0–46.0)
Hemoglobin: 10 g/dL — ABNORMAL LOW (ref 12.0–15.0)
Lymphocytes Relative: 25 %
Lymphs Abs: 1.2 10*3/uL (ref 0.7–4.0)
MCH: 31.4 pg (ref 26.0–34.0)
MCHC: 32.5 g/dL (ref 30.0–36.0)
MCV: 96.9 fL (ref 78.0–100.0)
MONOS PCT: 14 %
Monocytes Absolute: 0.7 10*3/uL (ref 0.1–1.0)
Neutro Abs: 2.8 10*3/uL (ref 1.7–7.7)
Neutrophils Relative %: 55 %
PLATELETS: 307 10*3/uL (ref 150–400)
RBC: 3.18 MIL/uL — AB (ref 3.87–5.11)
RDW: 14.4 % (ref 11.5–15.5)
WBC: 5 10*3/uL (ref 4.0–10.5)

## 2016-02-03 LAB — BASIC METABOLIC PANEL
Anion gap: 7 (ref 5–15)
BUN: 24 mg/dL — ABNORMAL HIGH (ref 6–20)
CHLORIDE: 104 mmol/L (ref 101–111)
CO2: 28 mmol/L (ref 22–32)
CREATININE: 1.23 mg/dL — AB (ref 0.44–1.00)
Calcium: 8.7 mg/dL — ABNORMAL LOW (ref 8.9–10.3)
GFR calc Af Amer: 42 mL/min — ABNORMAL LOW (ref >60)
GFR calc non Af Amer: 37 mL/min — ABNORMAL LOW (ref >60)
Glucose, Bld: 85 mg/dL (ref 65–99)
Potassium: 4.1 mmol/L (ref 3.5–5.1)
Sodium: 139 mmol/L (ref 135–145)

## 2016-02-07 ENCOUNTER — Other Ambulatory Visit (HOSPITAL_COMMUNITY)
Admission: AD | Admit: 2016-02-07 | Discharge: 2016-02-07 | Disposition: A | Discharge status: Home / Self Care | Payer: Medicare Other | Source: Skilled Nursing Facility | Attending: Internal Medicine | Admitting: Internal Medicine

## 2016-02-07 ENCOUNTER — Encounter (HOSPITAL_COMMUNITY)
Admission: RE | Admit: 2016-02-07 | Discharge: 2016-02-07 | Disposition: A | Discharge status: Home / Self Care | Payer: Medicare Other | Source: Skilled Nursing Facility | Attending: Internal Medicine | Admitting: Internal Medicine

## 2016-02-07 DIAGNOSIS — I5022 Chronic systolic (congestive) heart failure: Secondary | ICD-10-CM | POA: Insufficient documentation

## 2016-02-07 DIAGNOSIS — M86171 Other acute osteomyelitis, right ankle and foot: Secondary | ICD-10-CM | POA: Insufficient documentation

## 2016-02-07 DIAGNOSIS — Z48812 Encounter for surgical aftercare following surgery on the circulatory system: Secondary | ICD-10-CM | POA: Insufficient documentation

## 2016-02-07 DIAGNOSIS — I739 Peripheral vascular disease, unspecified: Secondary | ICD-10-CM | POA: Diagnosis present

## 2016-02-07 DIAGNOSIS — R7881 Bacteremia: Secondary | ICD-10-CM | POA: Insufficient documentation

## 2016-02-07 LAB — OCCULT BLOOD X 1 CARD TO LAB, STOOL: FECAL OCCULT BLD: NEGATIVE

## 2016-02-08 ENCOUNTER — Encounter (HOSPITAL_COMMUNITY)
Admission: RE | Admit: 2016-02-08 | Discharge: 2016-02-08 | Disposition: A | Discharge status: Home / Self Care | Payer: Medicare Other | Source: Skilled Nursing Facility | Attending: Internal Medicine | Admitting: Internal Medicine

## 2016-02-08 DIAGNOSIS — I739 Peripheral vascular disease, unspecified: Secondary | ICD-10-CM | POA: Insufficient documentation

## 2016-02-08 DIAGNOSIS — R7881 Bacteremia: Secondary | ICD-10-CM | POA: Insufficient documentation

## 2016-02-08 DIAGNOSIS — I5022 Chronic systolic (congestive) heart failure: Secondary | ICD-10-CM | POA: Insufficient documentation

## 2016-02-08 DIAGNOSIS — Z48812 Encounter for surgical aftercare following surgery on the circulatory system: Secondary | ICD-10-CM | POA: Insufficient documentation

## 2016-02-08 DIAGNOSIS — M86171 Other acute osteomyelitis, right ankle and foot: Secondary | ICD-10-CM | POA: Insufficient documentation

## 2016-02-08 LAB — CBC WITH DIFFERENTIAL/PLATELET
Basophils Absolute: 0 10*3/uL (ref 0.0–0.1)
Basophils Relative: 1 %
EOS ABS: 0.1 10*3/uL (ref 0.0–0.7)
EOS PCT: 2 %
HCT: 31.3 % — ABNORMAL LOW (ref 36.0–46.0)
Hemoglobin: 10.1 g/dL — ABNORMAL LOW (ref 12.0–15.0)
LYMPHS ABS: 1.2 10*3/uL (ref 0.7–4.0)
LYMPHS PCT: 18 %
MCH: 30.9 pg (ref 26.0–34.0)
MCHC: 32.3 g/dL (ref 30.0–36.0)
MCV: 95.7 fL (ref 78.0–100.0)
MONO ABS: 0.6 10*3/uL (ref 0.1–1.0)
MONOS PCT: 9 %
Neutro Abs: 4.7 10*3/uL (ref 1.7–7.7)
Neutrophils Relative %: 70 %
PLATELETS: 363 10*3/uL (ref 150–400)
RBC: 3.27 MIL/uL — AB (ref 3.87–5.11)
RDW: 14 % (ref 11.5–15.5)
WBC: 6.6 10*3/uL (ref 4.0–10.5)

## 2016-02-08 LAB — C-REACTIVE PROTEIN: CRP: 2.1 mg/dL — ABNORMAL HIGH (ref <1.0)

## 2016-02-08 LAB — BASIC METABOLIC PANEL
Anion gap: 6 (ref 5–15)
BUN: 31 mg/dL — AB (ref 6–20)
CO2: 26 mmol/L (ref 22–32)
CREATININE: 1.27 mg/dL — AB (ref 0.44–1.00)
Calcium: 8.7 mg/dL — ABNORMAL LOW (ref 8.9–10.3)
Chloride: 106 mmol/L (ref 101–111)
GFR calc Af Amer: 41 mL/min — ABNORMAL LOW (ref >60)
GFR, EST NON AFRICAN AMERICAN: 35 mL/min — AB (ref >60)
Glucose, Bld: 111 mg/dL — ABNORMAL HIGH (ref 65–99)
POTASSIUM: 3.8 mmol/L (ref 3.5–5.1)
SODIUM: 138 mmol/L (ref 135–145)

## 2016-02-08 LAB — SEDIMENTATION RATE: Sed Rate: 45 mm/hr — ABNORMAL HIGH (ref 0–22)

## 2016-02-09 ENCOUNTER — Telehealth: Payer: Self-pay | Admitting: *Deleted

## 2016-02-09 NOTE — Telephone Encounter (Signed)
Kathie RhodesBetty from [facility] calling for orders to pull PICC at end of treatment. Spoke with Dr Orvan Falconerampbell. Per discharge summary on 1/5, Dr Orvan Falconerampbell noted that the PICC is to be removed at end of antibiotics. Relayed information to [place]. Andree CossHowell, Michelle M, RN

## 2016-02-09 NOTE — Telephone Encounter (Signed)
Kathie RhodesBetty, RN with [facility] called requesting an order for removal if picc line.  I called and gave her the Infectious Disease Dr.  Dr Cliffton AstersJohn Campbell  [phone] who has been seeing the patient.

## 2016-02-16 ENCOUNTER — Non-Acute Institutional Stay (SKILLED_NURSING_FACILITY): Payer: Medicare Other | Admitting: Internal Medicine

## 2016-02-16 ENCOUNTER — Encounter: Payer: Self-pay | Admitting: Internal Medicine

## 2016-02-16 DIAGNOSIS — I10 Essential (primary) hypertension: Secondary | ICD-10-CM | POA: Diagnosis not present

## 2016-02-16 DIAGNOSIS — I739 Peripheral vascular disease, unspecified: Secondary | ICD-10-CM | POA: Diagnosis not present

## 2016-02-16 DIAGNOSIS — I5030 Unspecified diastolic (congestive) heart failure: Secondary | ICD-10-CM

## 2016-02-16 NOTE — Progress Notes (Signed)
Location:   [facility] Room Number: 111/W Place of Service:  SNF [phone]) Provider:  Georgeanna LeaArlo Yanice Maqueda  GOLDING, JOHN CABOT, MD  Patient Care Team: Assunta FoundJohn Golding, MD as PCP - General (Family Medicine) Antoine PocheJonathan F Branch, MD as Consulting Physician (Cardiology) Cliffton AstersJohn Campbell, MD as Consulting Physician (Infectious Diseases)  Extended Emergency Contact Information Primary Emergency Contact: [name],[name]  [place] Home Phone: [phone] Mobile Phone: [phone] Relation: None Secondary Emergency Contact: [name],April  [place] Home Phone: [phone] 2010 Relation: None  Code Status:  DNR Goals of care: Advanced Directive information Advanced Directives 02/16/2016  Does Patient Have a Medical Advance Directive? Yes  Type of Advance Directive Out of facility DNR (pink MOST or yellow form)  Does patient want to make changes to medical advance directive? No - Patient declined  Copy of Healthcare Power of Attorney in Chart? -  Would patient like information on creating a medical advance directive? No - Patient declined     Chief Complaint  Patient presents with  . Acute Visit  To follow-up foot wounds-hypertension  HPI:  Pt is a 83 y.o. female seen today for an acute visit for follow-up foot wounds.  Patient does have a history of arthrectomy right peroneal artery, angioplasty of the right peroneal artery and tibioperoneal trunk on 01/04/16 and then underwent right great toe amputation on 01/05/16 by Dr. Myra GianottiBrabham   She has completed a course of IV antibiotics and this appears to have stabilized.  However nursing today has noted 3 new open areas on the right foot-there is no drainage bleeding or surrounding erythema at this time but this will have to be watched.  She has been followed closely by vascular surgery he  She is not really complaining of any pain.  She is currently lying in bed comfortably she has been afebrile does not  complain of any fever chills.  She also has a history of atrial fibrillation with hypertension-she had been on Norvasc yesterday went to the ER at one point secondary to significant only elevated blood pressure and Norvasc was increased up to 5 mg-this appears to have helped I did take her blood pressure manually today and got 160/60-I see listed 1 151/51-previous systolics appear to be in the 130-140  range although I do see 105/64 as well  Her atrial fibrillation appears to be rate controlled she is on aspirin for anticoagulation also continues on amiodarone--       Past Medical History:  Diagnosis Date  . Acid reflux   . Carotid artery occlusion    minimal  . Coronary artery disease   . Hypercholesteremia   . Hypertension   . Myocardial infarct   . PAF (paroxysmal atrial fibrillation) (HCC)   . Skin disorder   . Systolic murmur   . Thyroid disease    Past Surgical History:  Procedure Laterality Date  . ABDOMINAL HYSTERECTOMY    . AMPUTATION Right 01/05/2016   Procedure: RIGHT GREAT TOE AMPUTATION;  Surgeon: Nada LibmanVance W Brabham, MD;  [facility];  Service: Vascular;  Laterality: Right;  . CARDIAC CATHETERIZATION    . CARDIOVERSION  2012  . LOWER EXTREMITY ANGIOGRAM Right 12/30/2015   Procedure: RIGHT LOWER EXTREMITY ANGIOGRAM;  Surgeon: Ancil LinseyJason Evan Davis, MD;  Location: [facility] ORS;  Service: Vascular;  Laterality: Right;  accessed through left groin area - dressed with two 2x2 and 4x4 opsite  . PERIPHERAL VASCULAR CATHETERIZATION N/A 01/04/2016   Procedure: Lower Extremity Angiography;  Surgeon: Faylene MillionVance  Janae Bridgeman, MD;  Location: [facility] INVASIVE CV LAB;  Service: Cardiovascular;  Laterality: N/A;  . PERIPHERAL VASCULAR CATHETERIZATION Right 01/04/2016   Procedure: Peripheral Vascular Atherectomy;  Surgeon: Nada Libman, MD;  Location: [facility] INVASIVE CV LAB;  Service: Cardiovascular;  Laterality: Right;  Peroneal  . PERIPHERAL VASCULAR CATHETERIZATION N/A 02/01/2016   Procedure: Abdominal  Aortogram w/Lower Extremity;  Surgeon: Nada Libman, MD;  Location: [facility] INVASIVE CV LAB;  Service: Cardiovascular;  Laterality: N/A;  . PERIPHERAL VASCULAR CATHETERIZATION  02/01/2016   Procedure: Peripheral Vascular Atherectomy;  Surgeon: Nada Libman, MD;  Location: [facility] INVASIVE CV LAB;  Service: Cardiovascular;;  Left   . THYROID SURGERY      No Known Allergies  Current Outpatient Prescriptions on File Prior to Visit  Medication Sig Dispense Refill  . amiodarone (PACERONE) 200 MG tablet Take 0.5 tablets (100 mg total) by mouth daily. 15 tablet 8  . amLODipine (NORVASC) 5 MG tablet Take 1 tablet (5 mg total) by mouth daily. 30 tablet 0  . aspirin EC 81 MG tablet Take 1 tablet (81 mg total) by mouth daily. 90 tablet 3  . atorvastatin (LIPITOR) 20 MG tablet Take 20 mg by mouth every other day. Take 1 tablet (20 mg) by mouth every other night    . furosemide (LASIX) 20 MG tablet TAKE ONE TABLET BY MOUTH EVERY DAY AS NEEDED FOR SWELLING. 30 tablet 3  . levothyroxine (SYNTHROID, LEVOTHROID) 75 MCG tablet Take 75 mcg by mouth daily.      Marland Kitchen omeprazole (PRILOSEC) 40 MG capsule Take 40 mg by mouth at bedtime.     No current facility-administered medications on file prior to visit.     Review of Systems     General does not complaining fever or chills .  Skin as history of lower extremity wounds as noted above  Head ears eyes nose mouth and throat does not complaining of any visual changes sore throat.  Respiratory denies shortness of breath or cough.  Cardiac is not complaining of any chest pain has had some lower extremity edema more so on the left Doppler was negative for DVT--.  GI is not complaining of any nausea vomiting or abdominal pain.  Musculoskeletal she has gained strength is not complaining of joint pain for pain this afternoon  Neurologic is not complaining of dizziness or headache  Psych is not complaining of anxiety or depression appears to be at baseline     There is no immunization history on file for this patient. Pertinent  Health Maintenance Due  Topic Date Due  . DEXA SCAN  02/09/1987  . INFLUENZA VACCINE  12/02/2016 (Originally 08/03/2015)  . PNA vac Low Risk Adult (1 of 2 - PCV13) 12/02/2016 (Originally 02/09/1987)   No flowsheet data found. Functional Status Survey:    Vitals:   02/16/16 1426  BP: (!) 151/55  Pulse: 78  Resp: 16  Temp: 98 F (36.7 C)  TempSrc: Oral  SpO2: 98%  Manual blood pressure was 160/60 Weight is 119.8 appears to have some variability it was as low as 114 on January 27 but baseline appears to be more around 120 Physical Exam   Gen. this is a pleasant elderly female in no distress lying comfortably in bed HENT:  Head: Normocephalic.  Mouth/Throat: Oropharynx is clear and moist.  Eyes: Pupils appear to be reactive to light bilaterally.   Cardiovascular: Normal rateand normal heart sounds.  Pulmonary/Chest: Effort normaland breath sounds normal. No respiratory distress. She has  no wheezes. She has no rales.  Abdominal: Soft. Bowel sounds are normal. She exhibits no distension. There is no tenderness. There is no rebound.  Musculoskeletal:  Is able to move all extremities 4 with baseline strength it appears  Neurological: She is grossly alert and oriented pleasant and appropriatr Moving all extremities wellalthough lower extremities di Shoulder shrug is intact bilaterally-. Her speech is clear Cranial nerves appear to be intact no lateralizing findings Skin: Skin is warmand dry. No rashnoted ( She does have wounds on her lower extremities which have been followed by wound care  She is status post right great toe amputation-she does have 2 open areas on the dorsum of the right foot wound beds are erythematous appear clean there is no surrounding erythema drainage or odor-apparently per wound care nurse there were concerns for possible erythema when legs were in a dependent position but now  that she is in bed this appears to be resolved.  There is no tenderness to palpation or firmness around the wound sites.  She also had a CT scab that has  come off the lateral right foot this area also does not look to be infected--area appears clean there again there is no surrounding erythema drainage or odor.  She also has an open area on her heel which appears similar.     Labs reviewed:  Recent Labs  01/27/16 1600 02/01/16 1344 02/03/16 0800 02/08/16 0930  NA 138 142 139 138  K 4.2 4.3 4.1 3.8  CL 103 103 104 106  CO2 27  --  28 26  GLUCOSE 90 88 85 111*  BUN 24* 24* 24* 31*  CREATININE 1.30* 1.20* 1.23* 1.27*  CALCIUM 8.8*  --  8.7* 8.7*    Recent Labs  01/17/16 1718  AST 22  ALT <5*  ALKPHOS 95  BILITOT 0.5  PROT 7.0  ALBUMIN 2.9*    Recent Labs  01/27/16 1600 02/01/16 1344 02/03/16 0800 02/08/16 0930  WBC 7.0  --  5.0 6.6  NEUTROABS 4.8  --  2.8 4.7  HGB 10.5* 10.9* 10.0* 10.1*  HCT 32.5* 32.0* 30.8* 31.3*  MCV 97.0  --  96.9 95.7  PLT 355  --  307 363   Lab Results  Component Value Date   TSH 3.131 01/13/2016   No results found for: HGBA1C No results found for: CHOL, HDL, LDLCALC, LDLDIRECT, TRIG, CHOLHDL  Significant Diagnostic Results in last 30 days:  US Venous Img Lower Bilateral  Result Date: 01/18/2016 CLINICAL DATA:  Bilateral lower extremity edema. History of amputation of the right great toe. Evaluate for DVT. EXAM: BILATERAL LOWER EXTREMITY VENOUS DOPPLER ULTRASOUND TECHNIQUE: Gray-scale sonography with graded compression, as well as color Doppler and duplex ultrasound were performed to evaluate the lower extremity deep venous systems from the level of the common femoral vein and including the common femoral, femoral, profunda femoral, popliteal and calf veins including the posterior tibial, peroneal and gastrocnemius veins when visible. The superficial great saphenous vein was also interrogated. Spectral Doppler was utilized to  evaluate flow at rest and with distal augmentation maneuvers in the common femoral, femoral and popliteal veins. COMPARISON:  None. FINDINGS: RIGHT LOWER EXTREMITY Common Femoral Vein: No evidence of thrombus. Normal compressibility, respiratory phasicity and response to augmentation. Saphenofemoral Junction: No evidence of thrombus. Normal compressibility and flow on color Doppler imaging. Profunda Femoral Vein: No evidence of thrombus. Normal compressibility and flow on color Doppler imaging. Femoral Vein: No evidence of thrombus. Normal compressibility, respiratory phasicity  and response to augmentation. Popliteal Vein: No evidence of thrombus. Normal compressibility, respiratory phasicity and response to augmentation. Calf Veins: No evidence of thrombus. Normal compressibility and flow on color Doppler imaging. Superficial Great Saphenous Vein: No evidence of thrombus. Normal compressibility and flow on color Doppler imaging. Venous Reflux:  None. Other Findings: A minimal amount of edema is noted at the level of the right lower leg and calf (image 36). LEFT LOWER EXTREMITY Common Femoral Vein: No evidence of thrombus. Normal compressibility, respiratory phasicity and response to augmentation. Saphenofemoral Junction: No evidence of thrombus. Normal compressibility and flow on color Doppler imaging. Profunda Femoral Vein: No evidence of thrombus. Normal compressibility and flow on color Doppler imaging. Femoral Vein: No evidence of thrombus. Normal compressibility, respiratory phasicity and response to augmentation. Popliteal Vein: No evidence of thrombus. Normal compressibility, respiratory phasicity and response to augmentation. Calf Veins: No evidence of thrombus. Normal compressibility and flow on color Doppler imaging. Superficial Great Saphenous Vein: No evidence of thrombus. Normal compressibility and flow on color Doppler imaging. Venous Reflux:  None. Other Findings: A minimal amount of subcutaneous  edema is noted at the level of left ankle (image 73). IMPRESSION: No evidence of DVT within either lower extremity. Electronically Signed   By: Katherina Right M.D.   On: 01/18/2016 14:05   US Arterial Seg Single  Result Date: 01/18/2016 CLINICAL DATA:  Left lower extremity pain. Right great toe gangrene and osteomyelitis. Blisters and nonhealing wounds in the left foot. EXAM: NONINVASIVE PHYSIOLOGIC VASCULAR STUDY OF BILATERAL LOWER EXTREMITIES TECHNIQUE: Evaluation of both lower extremities were performed at rest, including calculation of ankle-brachial indices with single level Doppler, pressure and pulse volume recording. COMPARISON:  Arteriogram 12/30/2015, ankle-brachial indices from 12/28/2015 FINDINGS: Right ABI:  Not obtainable Left ABI:  Not obtainable Right Lower Extremity: The right ankle arteries are noncompressible, therefore, ankle-brachial indices could not be obtained. Monophasic Doppler waveforms in the right ankle. Left Lower Extremity: Left ankle arteries are noncompressible. Biphasic and monophasic Doppler waveforms in the left ankle. Waveforms are similar to the previous examination. IMPRESSION: Ankle-brachial indices could not be obtained because the ankle arteries are noncompressible and likely heavily calcified. Doppler waveforms in both ankles suggest underlying peripheral vascular disease. Doppler waveforms are similar to the examination on 12/28/2015. Electronically Signed   By: Richarda Overlie M.D.   On: 01/18/2016 14:11    Assessment/Plan  #1-history of new right foot woundswith hx PAD-at this point do not appear be infected but she has a significant history here-with a history of peripheral vascular disease-I discussed this and evaluated this with wound care nurse and she will contact vascular for expedient follow-up again this will have to be monitored for any sign of worsening infection etc.  #2 hypertension blood pressure systolically appears to be elevated fairly  frequently-consider increasing Norvasc she actually has cardiology follow-up tomorrow and will defer to them-have advised nursing to send her recent blood pressures to cardiology for their evaluation-  #3-weight gain? It appears she has gained some weight over the past couple weeks but actually she is now closer to her baseline which appears to be the low 120s-edema does not appear to be significantly increased but this will have to be watched she does have a when necessary order for Lasix-she does have a history of diastolic CHF-but most recent BNP on January 17 was fairly unremarkable at 122--at this point will monitor she is not complaining of any increased shortness of breath chest pain-and again she does have cardiology  follow-up tomorrow for evaluation-will await their input.  [id number]   [id number]      London Sheer, CMA [phone]

## 2016-02-17 ENCOUNTER — Ambulatory Visit (INDEPENDENT_AMBULATORY_CARE_PROVIDER_SITE_OTHER): Payer: Medicare Other | Admitting: Adult Health

## 2016-02-17 ENCOUNTER — Encounter: Payer: Self-pay | Admitting: Adult Health

## 2016-02-17 VITALS — BP 144/60 | HR 59 | Ht 65.0 in

## 2016-02-17 DIAGNOSIS — I2510 Atherosclerotic heart disease of native coronary artery without angina pectoris: Secondary | ICD-10-CM

## 2016-02-17 DIAGNOSIS — I10 Essential (primary) hypertension: Secondary | ICD-10-CM | POA: Diagnosis not present

## 2016-02-17 DIAGNOSIS — I739 Peripheral vascular disease, unspecified: Secondary | ICD-10-CM | POA: Diagnosis not present

## 2016-02-17 DIAGNOSIS — E784 Other hyperlipidemia: Secondary | ICD-10-CM | POA: Diagnosis not present

## 2016-02-17 DIAGNOSIS — E7849 Other hyperlipidemia: Secondary | ICD-10-CM

## 2016-02-17 NOTE — Patient Instructions (Addendum)
Your physician wants you to follow-up in: 6 Months with Dr. Branch. You will receive a reminder letter in the mail two months in advance. If you don't receive a letter, please call our office to schedule the follow-up appointment.  Your physician recommends that you continue on your current medications as directed. Please refer to the Current Medication list given to you today.  If you need a refill on your cardiac medications before your next appointment, please call your pharmacy.  Thank you for choosing [facility]!   

## 2016-02-17 NOTE — Progress Notes (Signed)
Cardiology Office Note   Date:  02/17/2016   ID:  [name], [name] [birth year], MRN [id number]  PCP:  Colette Ribas, MD  Cardiologist: Arlington Calix, NP   No chief complaint on file.     History of Present Illness: [name] is a 83 y.o. female who presents for ongoing assessment and management of coronary artery disease, most recent cardiac catheterization in 2008 revealed an ostial 80% left circumflex lesion and was treated medicallly. Other history includes paroxysmal atrial fibrillation with cardioversion., and peripheral arterial disease.  Findings:  Aortogram: No significant renal artery stenosis. The infrarenal abdominal aorta is heavily calcified but patent. Bilateral common and external iliac arteries are widely patent. Right Lower Extremity: The right common femoral and profunda femoral artery are patent throughout their course. The right superficial femoral artery is calcified. No hemodynamically significant lesions are identified however the luminal caliber is diminished. The popliteal artery is patent throughout it's course. There is single vessel runoff via the peroneal artery which perfuses the foot. There is a high-grade greater than 80% stenosis within the tibioperoneal trunk as well as the proximal peroneal artery Left Lower Extremity:Not evaluated  Impression:             #1  heavily calcified 95% stenosis of the tibioperoneal trunk successfully treated with atherectomy and angioplasty using a CSI microdevice and a 3 mm balloon with residual stenosis of 0%             #2  high-grade, 95% focal lesion within the peroneal artery successfully treated with atherectomy and angioplasty using a CSI micro-catheter and a 3 mm balloon with residual stenosis of 0%  On last office visit she was without any cardiac complaint. Her son was concerned about a blister that had formed on her heel. She was referred to wound  care specialist, a heel protector was provided for her via skilled nursing facility prescription. Her blood pressure was mildly elevated in the office. May need to improve medical management if continues to be elevated on this visit.  Since being seen last she had peripheral vascular atherectomy, as above, and did have a right great toe amputation on 01/05/2016. She is now undergoing rehabilitation at [facility], she has completed a course of IV antibiotics.  Her son is very concerned about circulation in bilateral lower extremities. Left leg does not appear to be healing well, she does have dependent edema. She denies any pain but there are open wounds and odor coming from the left leg. She is wheelchair-bound. Legs remain independent position most of the day unless she is in bed  Past Medical History:  Diagnosis Date  . Acid reflux   . Carotid artery occlusion    minimal  . Coronary artery disease   . Hypercholesteremia   . Hypertension   . Myocardial infarct   . PAF (paroxysmal atrial fibrillation) (HCC)   . Skin disorder   . Systolic murmur   . Thyroid disease     Past Surgical History:  Procedure Laterality Date  . ABDOMINAL HYSTERECTOMY    . AMPUTATION Right 01/05/2016   Procedure: RIGHT GREAT TOE AMPUTATION;  Surgeon: Nada Libman, MD;  Location: [facility] OR;  Service: Vascular;  Laterality: Right;  . CARDIAC CATHETERIZATION    . CARDIOVERSION  2012  . LOWER EXTREMITY ANGIOGRAM Right 12/30/2015   Procedure: RIGHT LOWER EXTREMITY ANGIOGRAM;  Surgeon: Ancil Linsey, MD;  Location: [facility] ORS;  Service: Vascular;  Laterality: Right;  accessed through left groin area - dressed with two 2x2 and 4x4 opsite  . PERIPHERAL VASCULAR CATHETERIZATION N/A 01/04/2016   Procedure: Lower Extremity Angiography;  Surgeon: Nada Libman, MD;  Location: [facility] INVASIVE CV LAB;  Service: Cardiovascular;  Laterality: N/A;  . PERIPHERAL VASCULAR CATHETERIZATION Right 01/04/2016   Procedure:  Peripheral Vascular Atherectomy;  Surgeon: Nada Libman, MD;  Location: [facility] INVASIVE CV LAB;  Service: Cardiovascular;  Laterality: Right;  Peroneal  . PERIPHERAL VASCULAR CATHETERIZATION N/A 02/01/2016   Procedure: Abdominal Aortogram w/Lower Extremity;  Surgeon: Nada Libman, MD;  Location: [facility] INVASIVE CV LAB;  Service: Cardiovascular;  Laterality: N/A;  . PERIPHERAL VASCULAR CATHETERIZATION  02/01/2016   Procedure: Peripheral Vascular Atherectomy;  Surgeon: Nada Libman, MD;  Location: [facility] INVASIVE CV LAB;  Service: Cardiovascular;;  Left   . THYROID SURGERY       No current outpatient prescriptions on file.   No current facility-administered medications for this visit.     Allergies:   Patient has no known allergies.    Social History:  The patient  reports that she has never smoked. She has never used smokeless tobacco. She reports that she does not drink alcohol or use drugs.   Family History:  The patient's Family history is unknown by patient.    ROS: All other systems are reviewed and negative. Unless otherwise mentioned in H&P    PHYSICAL EXAM: VS:  BP (!) 144/60   Pulse (!) 59   Ht 5\' 5"  (1.651 m)   SpO2 97%  , BMI There is no height or weight on file to calculate BMI. GEN: Well nourished, well developed, in no acute distress  HEENT: normal  Neck: no JVD, carotid bruits, or masses Cardiac:RRR 1/6 systolic murmurs, rubs, or gallops,no edema  Respiratory:  Clear to auscultation bilaterally, normal work of breathing GI: soft, nontender, nondistended, + BS MS: Bilateral leg edema, right great to toe is amputated, erythema and odor from the left foot with diminished pulses bilaterally. Wearing TED hose that are wrinkled and causing constriction and a sausagelike pattern up her legs. Skin: warm and dry, no rash Neuro:  Strength and sensation are intact Psych: euthymic mood, full affect   Recent Labs: 01/13/2016: TSH 3.131 01/17/2016: ALT <5 01/19/2016: B  Natriuretic Peptide 122.0 02/08/2016: BUN 31; Creatinine, Ser 1.27; Hemoglobin 10.1; Platelets 363; Potassium 3.8; Sodium 138    Lipid Panel No results found for: CHOL, TRIG, HDL, CHOLHDL, VLDL, LDLCALC, LDLDIRECT    Wt Readings from Last 3 Encounters:  02/02/16 121 lb 11.1 oz (55.2 kg)  01/21/16 124 lb (56.2 kg)  01/17/16 120 lb (54.4 kg)      Other studies Reviewed: Echocardiogram [id number]\[id number] - Left ventricle: The cavity size was normal. Wall thickness was   increased in a pattern of moderate LVH. Systolic function was   normal. The estimated ejection fraction was in the range of 60%   to 65%. Wall motion was normal; there were no regional wall   motion abnormalities. Features are consistent with a pseudonormal   left ventricular filling pattern, with concomitant abnormal   relaxation and increased filling pressure (grade 2 diastolic   dysfunction). E/medial e&' > 15 suggests LV end diastolic pressure   at least 20 mmHg. - Aortic valve: Trileaflet; moderately calcified leaflets.   Sclerosis without stenosis. - Mitral valve: Moderately calcified annulus. Mildly calcified   leaflets . There was trivial regurgitation. - Left atrium: The atrium was mildly dilated. - Right ventricle: The  cavity size was normal. Systolic function   was normal. - Pulmonary arteries: No complete TR doppler jet so unable to   estimate PA systolic pressure. - Inferior vena cava: The vessel was normal in size. The   respirophasic diameter changes were in the normal range (= 50%),   consistent with normal central venous pressure. - Pericardium, extracardiac: Small to moderate circumferential   pericardial effusion, no tamponade. Pleural effusion noted.  ASSESSMENT AND PLAN:  1. Severe peripheral arterial disease: Being followed by vein vascular surgery. Left leg does not appear to be healing well. She is referred back to surgery for their reevaluation. They may need to do additional work or amputation.  We'll defer to them for clinical intervention.  2. Coronary artery disease: Medical management only, due to frailty and age and multiple comorbidities. Continue secondary prevention.  3. Hypertension : Elevated in the office today but review of trends from the skilled nursing facility finds her to be within normal limits. The changes in her medication regimen.  4. Chronic lower extremity edema: Multifactorial, PVD, keeping legs in dependent position, and on amlodipine which can also cause some dependent edema. I've given instructions to skilled nursing facility to place like elevators on her wheelchair to prevent her from keeping her legs dependent, with worsening edema. She is also to be remeasured for her support hose as they are too long for her and causing binding throughout her legs as they are wrinkling.  Current medicines are reviewed at length with the patient today.    Labs/ tests ordered today include:  No orders of the defined types were placed in this encounter.    Disposition:   FU with 6 months  Signed, Joni ReiningKathryn Nairi Oswald, NP  02/17/2016 2:10 PM    [facility] [address], [place], [place] [address] Phone: [phone]; Fax: [phone]

## 2016-02-17 NOTE — Progress Notes (Signed)
Name: [name]    DOB: 08/19/1920  Age: [age] y.o.  MR#: [id number]       PCP:  Colette RibasGOLDING, JOHN CABOT, MD      Insurance: Payor: BLUE [employer] MEDICARE / Plan: BCBS MEDICARE / Product Type: *No Product type* /   CC:   No chief complaint on file.   VS Vitals:   02/17/16 1331  BP: (!) 144/60  Pulse: (!) 59  SpO2: 97%  Height: 5\' 5"  (1.651 m)    Weights Current Weight  02/02/16 121 lb 11.1 oz (55.2 kg)  01/21/16 124 lb (56.2 kg)  01/17/16 120 lb (54.4 kg)    Blood Pressure  BP Readings from Last 3 Encounters:  02/17/16 (!) 144/60  02/16/16 (!) 151/55  02/02/16 (!) 155/41     Admit date:  (Not on file) Last encounter with [facility]:  01/17/2016   Allergy Patient has no known allergies.  No current outpatient prescriptions on file.   No current facility-administered medications for this visit.     Discontinued Meds:   There are no discontinued medications.  Patient Active Problem List   Diagnosis Date Noted  . PAD (peripheral artery disease) (HCC) 02/01/2016  . Bacteremia   . Osteomyelitis of right foot (HCC) 12/27/2015  . Gangrene of toe of right foot (HCC) 12/27/2015  . Gangrene of foot (HCC)   . Toe infection   . CAD (coronary artery disease) 03/26/2013  . HTN (hypertension) 03/26/2013  . Hyperlipidemia 03/26/2013  . Heart murmur 03/26/2013    LABS    Component Value Date/Time   NA 138 02/08/2016 0930   NA 139 02/03/2016 0800   NA 142 02/01/2016 1344   K 3.8 02/08/2016 0930   K 4.1 02/03/2016 0800   K 4.3 02/01/2016 1344   CL 106 02/08/2016 0930   CL 104 02/03/2016 0800   CL 103 02/01/2016 1344   CO2 26 02/08/2016 0930   CO2 28 02/03/2016 0800   CO2 27 01/27/2016 1600   GLUCOSE 111 (H) 02/08/2016 0930   GLUCOSE 85 02/03/2016 0800   GLUCOSE 88 02/01/2016 1344   BUN 31 (H) 02/08/2016 0930   BUN 24 (H) 02/03/2016 0800   BUN 24 (H) 02/01/2016 1344   CREATININE 1.27 (H) 02/08/2016 0930   CREATININE 1.23 (H) 02/03/2016 0800   CREATININE 1.20 (H)  02/01/2016 1344   CALCIUM 8.7 (L) 02/08/2016 0930   CALCIUM 8.7 (L) 02/03/2016 0800   CALCIUM 8.8 (L) 01/27/2016 1600   GFRNONAA 35 (L) 02/08/2016 0930   GFRNONAA 37 (L) 02/03/2016 0800   GFRNONAA 34 (L) 01/27/2016 1600   GFRAA 41 (L) 02/08/2016 0930   GFRAA 42 (L) 02/03/2016 0800   GFRAA 40 (L) 01/27/2016 1600   CMP     Component Value Date/Time   NA 138 02/08/2016 0930   K 3.8 02/08/2016 0930   CL 106 02/08/2016 0930   CO2 26 02/08/2016 0930   GLUCOSE 111 (H) 02/08/2016 0930   BUN 31 (H) 02/08/2016 0930   CREATININE 1.27 (H) 02/08/2016 0930   CALCIUM 8.7 (L) 02/08/2016 0930   PROT 7.0 01/17/2016 1718   ALBUMIN 2.9 (L) 01/17/2016 1718   AST 22 01/17/2016 1718   ALT <5 (L) 01/17/2016 1718   ALKPHOS 95 01/17/2016 1718   BILITOT 0.5 01/17/2016 1718   GFRNONAA 35 (L) 02/08/2016 0930   GFRAA 41 (L) 02/08/2016 0930       Component Value Date/Time   WBC 6.6 02/08/2016 0930  WBC 5.0 02/03/2016 0800   WBC 7.0 01/27/2016 1600   HGB 10.1 (L) 02/08/2016 0930   HGB 10.0 (L) 02/03/2016 0800   HGB 10.9 (L) 02/01/2016 1344   HCT 31.3 (L) 02/08/2016 0930   HCT 30.8 (L) 02/03/2016 0800   HCT 32.0 (L) 02/01/2016 1344   MCV 95.7 02/08/2016 0930   MCV 96.9 02/03/2016 0800   MCV 97.0 01/27/2016 1600    Lipid Panel  No results found for: CHOL, TRIG, HDL, CHOLHDL, VLDL, LDLCALC, LDLDIRECT  ABG    Component Value Date/Time   TCO2 29 02/01/2016 1344     Lab Results  Component Value Date   TSH 3.131 01/13/2016   BNP (last 3 results)  Recent Labs  12/27/15 1226 01/19/16 1000  BNP 270.0* 122.0*    ProBNP (last 3 results) No results for input(s): PROBNP in the last 8760 hours.  Cardiac Panel (last 3 results) No results for input(s): CKTOTAL, CKMB, TROPONINI, RELINDX in the last 72 hours.  Iron/TIBC/Ferritin/ %Sat    Component Value Date/Time   IRON 23 (L) 01/13/2016 0500   TIBC 196 (L) 01/13/2016 0500   FERRITIN 128 01/13/2016 0500   IRONPCTSAT 12 01/13/2016 0500      EKG Orders placed or performed during the hospital encounter of 01/21/16  . ED EKG  . ED EKG  . EKG 12-Lead  . EKG 12-Lead  . EKG     Prior Assessment and Plan Problem List as of 02/17/2016 Reviewed: 02/16/2016  8:34 PM by Edmon Crape, PA-C     Cardiovascular and Mediastinum   CAD (coronary artery disease)   HTN (hypertension)   PAD (peripheral artery disease) (HCC)     Musculoskeletal and Integument   Osteomyelitis of right foot (HCC)     Other   Hyperlipidemia   Heart murmur   Gangrene of foot (HCC)   Toe infection   Gangrene of toe of right foot (HCC)   Bacteremia       Imaging: US Venous Img Lower Bilateral  Result Date: 01/18/2016 CLINICAL DATA:  Bilateral lower extremity edema. History of amputation of the right great toe. Evaluate for DVT. EXAM: BILATERAL LOWER EXTREMITY VENOUS DOPPLER ULTRASOUND TECHNIQUE: Gray-scale sonography with graded compression, as well as color Doppler and duplex ultrasound were performed to evaluate the lower extremity deep venous systems from the level of the common femoral vein and including the common femoral, femoral, profunda femoral, popliteal and calf veins including the posterior tibial, peroneal and gastrocnemius veins when visible. The superficial great saphenous vein was also interrogated. Spectral Doppler was utilized to evaluate flow at rest and with distal augmentation maneuvers in the common femoral, femoral and popliteal veins. COMPARISON:  None. FINDINGS: RIGHT LOWER EXTREMITY Common Femoral Vein: No evidence of thrombus. Normal compressibility, respiratory phasicity and response to augmentation. Saphenofemoral Junction: No evidence of thrombus. Normal compressibility and flow on color Doppler imaging. Profunda Femoral Vein: No evidence of thrombus. Normal compressibility and flow on color Doppler imaging. Femoral Vein: No evidence of thrombus. Normal compressibility, respiratory phasicity and response to augmentation. Popliteal  Vein: No evidence of thrombus. Normal compressibility, respiratory phasicity and response to augmentation. Calf Veins: No evidence of thrombus. Normal compressibility and flow on color Doppler imaging. Superficial Great Saphenous Vein: No evidence of thrombus. Normal compressibility and flow on color Doppler imaging. Venous Reflux:  None. Other Findings: A minimal amount of edema is noted at the level of the right lower leg and calf (image 36). LEFT LOWER EXTREMITY Common Femoral Vein:  No evidence of thrombus. Normal compressibility, respiratory phasicity and response to augmentation. Saphenofemoral Junction: No evidence of thrombus. Normal compressibility and flow on color Doppler imaging. Profunda Femoral Vein: No evidence of thrombus. Normal compressibility and flow on color Doppler imaging. Femoral Vein: No evidence of thrombus. Normal compressibility, respiratory phasicity and response to augmentation. Popliteal Vein: No evidence of thrombus. Normal compressibility, respiratory phasicity and response to augmentation. Calf Veins: No evidence of thrombus. Normal compressibility and flow on color Doppler imaging. Superficial Great Saphenous Vein: No evidence of thrombus. Normal compressibility and flow on color Doppler imaging. Venous Reflux:  None. Other Findings: A minimal amount of subcutaneous edema is noted at the level of left ankle (image 73). IMPRESSION: No evidence of DVT within either lower extremity. Electronically Signed   By: Katherina Right M.D.   On: 01/18/2016 14:05   US Arterial Seg Single  Result Date: 01/18/2016 CLINICAL DATA:  Left lower extremity pain. Right great toe gangrene and osteomyelitis. Blisters and nonhealing wounds in the left foot. EXAM: NONINVASIVE PHYSIOLOGIC VASCULAR STUDY OF BILATERAL LOWER EXTREMITIES TECHNIQUE: Evaluation of both lower extremities were performed at rest, including calculation of ankle-brachial indices with single level Doppler, pressure and pulse volume  recording. COMPARISON:  Arteriogram 12/30/2015, ankle-brachial indices from 12/28/2015 FINDINGS: Right ABI:  Not obtainable Left ABI:  Not obtainable Right Lower Extremity: The right ankle arteries are noncompressible, therefore, ankle-brachial indices could not be obtained. Monophasic Doppler waveforms in the right ankle. Left Lower Extremity: Left ankle arteries are noncompressible. Biphasic and monophasic Doppler waveforms in the left ankle. Waveforms are similar to the previous examination. IMPRESSION: Ankle-brachial indices could not be obtained because the ankle arteries are noncompressible and likely heavily calcified. Doppler waveforms in both ankles suggest underlying peripheral vascular disease. Doppler waveforms are similar to the examination on 12/28/2015. Electronically Signed   By: Richarda Overlie M.D.   On: 01/18/2016 14:11

## 2016-02-23 ENCOUNTER — Telehealth: Payer: Self-pay

## 2016-02-23 NOTE — Telephone Encounter (Signed)
Called [name], Wound nurse @ [facility].  Appt. Given for 02/28/16 for left LE Art Duplex, ABI's and appt. With PA starting at 12:00 PM.  Agreed.  Advised to call office if pt's symptoms worsen prior to appt.  Verb. Understanding.

## 2016-02-23 NOTE — Telephone Encounter (Signed)
Rec'd phone call from Wound nurse at [facility].  Reported the pt. has devloped 3 open wounds, dorsal area, right foot.  Stated there is slough present and an odor to the wounds.  Requesting an earlier appt. than 3/19, to evaluate her right foot.  Advised will call back with appt. Information.  Agreed.

## 2016-02-28 ENCOUNTER — Ambulatory Visit (HOSPITAL_COMMUNITY)
Admit: 2016-02-28 | Discharge: 2016-02-28 | Disposition: A | Discharge status: Home / Self Care | Payer: Medicare Other | Attending: Surgery | Admitting: Surgery

## 2016-02-28 ENCOUNTER — Ambulatory Visit (INDEPENDENT_AMBULATORY_CARE_PROVIDER_SITE_OTHER): Payer: Self-pay | Admitting: Physician Assistant

## 2016-02-28 ENCOUNTER — Ambulatory Visit (INDEPENDENT_AMBULATORY_CARE_PROVIDER_SITE_OTHER)
Admit: 2016-02-28 | Discharge: 2016-02-28 | Disposition: A | Discharge status: Home / Self Care | Payer: Medicare Other | Attending: Surgery | Admitting: Surgery

## 2016-02-28 VITALS — BP 142/60 | HR 58 | Temp 97.0°F | Resp 20 | Ht 65.0 in | Wt 122.0 lb

## 2016-02-28 DIAGNOSIS — I739 Peripheral vascular disease, unspecified: Secondary | ICD-10-CM | POA: Insufficient documentation

## 2016-02-28 DIAGNOSIS — Z9862 Peripheral vascular angioplasty status: Secondary | ICD-10-CM

## 2016-02-28 NOTE — Progress Notes (Signed)
POST OPERATIVE OFFICE NOTE    CC:  F/u for surgery  HPI:  This is a 83 y.o. female who is s/p arthrectomy right peroneal artery, angioplasty of the right peroneal artery and tibioperoneal trunk on 01/04/16 and then underwent right great toe amputation on 01/05/16.  She presents today for follow up.  She is residing at [facility]  She states that she is walking with a walker minimally, but over all doing well.  The staff was concerned about her left foot being swollen and wanted to have her evaluated sooner than her f/u post op appointment.    No Known Allergies  No current outpatient prescriptions on file.   No current facility-administered medications for this visit.    Current Outpatient Prescriptions on File Prior to Visit  Medication Sig Dispense Refill  . amiodarone (PACERONE) 200 MG tablet Take 0.5 tablets (100 mg total) by mouth daily. 15 tablet 8  . amLODipine (NORVASC) 5 MG tablet Take 1 tablet (5 mg total) by mouth daily. 30 tablet 0  . aspirin EC 81 MG tablet Take 1 tablet (81 mg total) by mouth daily. 90 tablet 3  . atorvastatin (LIPITOR) 20 MG tablet Take 20 mg by mouth every other day. Take 1 tablet (20 mg) by mouth every other night    . cyanocobalamin 1000 MCG tablet Take 1,000 mcg by mouth daily.    . ferrous sulfate (QC FERROUS SULFATE) 325 (65 FE) MG tablet Take 325 mg by mouth daily with breakfast.    . furosemide (LASIX) 20 MG tablet TAKE ONE TABLET BY MOUTH EVERY DAY AS NEEDED FOR SWELLING. 30 tablet 3  . levothyroxine (SYNTHROID, LEVOTHROID) 75 MCG tablet Take 75 mcg by mouth daily.      Marland Kitchen omeprazole (PRILOSEC) 40 MG capsule Take 40 mg by mouth at bedtime.     No current facility-administered medications on file prior to visit.       ROS:  See HPI  Physical Exam:  Vitals:   02/28/16 1331  BP: (!) 142/60  Pulse: (!) 58  Resp: 20  Temp: 97 F (36.1 C)   ABI's  Right monophasic 0.95  Left monophasic 0.95   Left foot warm to touch.  Active  range of motion of ankle, knee and toes intact B. Left heel 3 cm X 3 cm black eschar Right great toe amputation site well healed, sutures removed. Right foot dorsum superficial abrasion x 2, lateral fifth metatarsal head ulcer 1 cm , Lateral heel ulcer 1.5 cm.   Heart RRR Lungs non labored breathing   Assessment/Plan:  This is a 83 y.o. female who is s/p: Procedure Performed: 02/01/2016             1.  Ultrasound-guided access, right femoral artery             2.  Abdominal aortogram             3.  Left lower extremity runoff             4.  Atherectomy, left tibioperoneal trunk             5.  Atherectomy, left peroneal artery             6.  Angioplasty, left tibioperoneal trunk             7.  Angioplasty, left peroneal artery Right great toe amputation 01/05/2016 Procedure Performed:01/04/2016  1.  Ultrasound-guided access, left femoral artery             2.  Abdominal aortogram             3.  Right lower extremity runoff             4.  Atherectomy, right peroneal artery             5.  Angioplasty, right peroneal artery             6.  Angioplasty, right tibioperoneal trunk             7.  Intra-arterial administration of nitroglycerin Continue santyl to the left heel, right lateral fifth met area and lateral heel. Elevate the LE when at rest and include active range of motion of the feet, legs and toes in her daily routine.   Luke warm water and soap soaks 2 times per week both feet, then dry well and apply dressings as directed.  F/U in 4 weeks for wound check.  Plan repeat ABI's in 3 months from today.     Theda Sers Naren Benally [facility] PA-C  [facility] [phone]  Clinic MD:  Trula Slade

## 2016-02-29 ENCOUNTER — Encounter: Payer: Self-pay | Admitting: Internal Medicine

## 2016-02-29 NOTE — Progress Notes (Signed)
Location:   [facility] Room Number: 111/w Place of Service:  SNF (31) Provider:  Georgeanna LeaArlo,Lassen  GOLDING, JOHN CABOT, MD  Patient Care Team: Assunta FoundJohn Golding, MD as PCP - General (Family Medicine) Antoine PocheJonathan F Branch, MD as Consulting Physician (Cardiology) Cliffton AstersJohn Campbell, MD as Consulting Physician (Infectious Diseases) [facility] (Skilled Nursing Facility)  Extended Emergency Contact Information Primary Emergency Contact: [name],[name]  [place] Home Phone: [phone] Mobile Phone: [phone] Relation: None Secondary Emergency Contact: [name],April  [place] Home Phone: [phone] Relation: None  Code Status:  DNR Goals of care: Advanced Directive information Advanced Directives 02/29/2016  Does Patient Have a Medical Advance Directive? Yes  Type of Advance Directive Out of facility DNR (pink MOST or yellow form)  Does patient want to make changes to medical advance directive? No - Patient declined  Copy of Healthcare Power of Attorney in Chart? -  Would patient like information on creating a medical advance directive? No - Patient declined     Chief Complaint  Patient presents with  . Medical Management of Chronic Issues    Rouitine Visit    HPI:  Pt is a 83 y.o. female seen today for medical management of chronic diseases.     Past Medical History:  Diagnosis Date  . Acid reflux   . Carotid artery occlusion    minimal  . Coronary artery disease   . Hypercholesteremia   . Hypertension   . Myocardial infarct   . PAF (paroxysmal atrial fibrillation) (HCC)   . Skin disorder   . Systolic murmur   . Thyroid disease    Past Surgical History:  Procedure Laterality Date  . ABDOMINAL HYSTERECTOMY    . AMPUTATION Right 01/05/2016   Procedure: RIGHT GREAT TOE AMPUTATION;  Surgeon: Nada LibmanVance W Brabham, MD;  [facility];  Service: Vascular;  Laterality: Right;  . CARDIAC CATHETERIZATION    .  CARDIOVERSION  2012  . LOWER EXTREMITY ANGIOGRAM Right 12/30/2015   Procedure: RIGHT LOWER EXTREMITY ANGIOGRAM;  Surgeon: Ancil LinseyJason Evan Davis, MD;  Location: [facility] ORS;  Service: Vascular;  Laterality: Right;  accessed through left groin area - dressed with two 2x2 and 4x4 opsite  . PERIPHERAL VASCULAR CATHETERIZATION N/A 01/04/2016   Procedure: Lower Extremity Angiography;  Surgeon: Nada LibmanVance W Brabham, MD;  [facility];  Service: Cardiovascular;  Laterality: N/A;  . PERIPHERAL VASCULAR CATHETERIZATION Right 01/04/2016   Procedure: Peripheral Vascular Atherectomy;  Surgeon: Nada LibmanVance W Brabham, MD;  Location: [facility] INVASIVE CV LAB;  Service: Cardiovascular;  Laterality: Right;  Peroneal  . PERIPHERAL VASCULAR CATHETERIZATION N/A 02/01/2016   Procedure: Abdominal Aortogram w/Lower Extremity;  Surgeon: Nada LibmanVance W Brabham, MD;  Location: [facility] INVASIVE CV LAB;  Service: Cardiovascular;  Laterality: N/A;  . PERIPHERAL VASCULAR CATHETERIZATION  02/01/2016   Procedure: Peripheral Vascular Atherectomy;  Surgeon: Nada LibmanVance W Brabham, MD;  Location: [facility] INVASIVE CV LAB;  Service: Cardiovascular;;  Left   . THYROID SURGERY      No Known Allergies  Current Outpatient Prescriptions on File Prior to Visit  Medication Sig Dispense Refill  . amiodarone (PACERONE) 200 MG tablet Take 0.5 tablets (100 mg total) by mouth daily. 15 tablet 8  . amLODipine (NORVASC) 5 MG tablet Take 1 tablet (5 mg total) by mouth daily. 30 tablet 0  . aspirin EC 81 MG tablet Take 1 tablet (81 mg total) by mouth daily. 90 tablet 3  . atorvastatin (LIPITOR) 20 MG tablet Take 20 mg by  mouth every other day. Take 1 tablet (20 mg) by mouth every other night    . collagenase (SANTYL) ointment Apply 1 application topically daily.    . cyanocobalamin 1000 MCG tablet Take 1,000 mcg by mouth daily.    . ferrous sulfate (QC FERROUS SULFATE) 325 (65 FE) MG tablet Take 325 mg by mouth daily with breakfast.    . furosemide (LASIX) 20 MG tablet TAKE ONE TABLET  BY MOUTH EVERY DAY AS NEEDED FOR SWELLING. 30 tablet 3  . levothyroxine (SYNTHROID, LEVOTHROID) 75 MCG tablet Take 75 mcg by mouth daily.      . magnesium hydroxide (MILK OF MAGNESIA) 400 MG/5ML suspension Take 30 mLs by mouth daily as needed for mild constipation.    Marland Kitchen omeprazole (PRILOSEC) 40 MG capsule Take 40 mg by mouth at bedtime.     No current facility-administered medications on file prior to visit.      Review of Systems   There is no immunization history on file for this patient. Pertinent  Health Maintenance Due  Topic Date Due  . DEXA SCAN  02/09/1987  . INFLUENZA VACCINE  12/02/2016 (Originally 08/03/2015)  . PNA vac Low Risk Adult (1 of 2 - PCV13) 12/02/2016 (Originally 02/09/1987)   No flowsheet data found. Functional Status Survey:    Vitals:   02/29/16 1051  BP: (!) 146/60  Pulse: (!) 56  Resp: 16  Temp: 98 F (36.7 C)  TempSrc: Oral  SpO2: 97%  Weight: 119 lb 6.4 oz (54.2 kg)  Height: 5' (1.524 m)   Body mass index is 23.32 kg/m. Physical Exam  Labs reviewed:  Recent Labs  01/27/16 1600 02/01/16 1344 02/03/16 0800 02/08/16 0930  NA 138 142 139 138  K 4.2 4.3 4.1 3.8  CL 103 103 104 106  CO2 27  --  28 26  GLUCOSE 90 88 85 111*  BUN 24* 24* 24* 31*  CREATININE 1.30* 1.20* 1.23* 1.27*  CALCIUM 8.8*  --  8.7* 8.7*    Recent Labs  01/17/16 1718  AST 22  ALT <5*  ALKPHOS 95  BILITOT 0.5  PROT 7.0  ALBUMIN 2.9*    Recent Labs  01/27/16 1600 02/01/16 1344 02/03/16 0800 02/08/16 0930  WBC 7.0  --  5.0 6.6  NEUTROABS 4.8  --  2.8 4.7  HGB 10.5* 10.9* 10.0* 10.1*  HCT 32.5* 32.0* 30.8* 31.3*  MCV 97.0  --  96.9 95.7  PLT 355  --  307 363   Lab Results  Component Value Date   TSH 3.131 01/13/2016   No results found for: HGBA1C No results found for: CHOL, HDL, LDLCALC, LDLDIRECT, TRIG, CHOLHDL  Significant Diagnostic Results in last 30 days:  No results found.  Assessment/Plan There are no diagnoses linked to this  encounter.   Family/ staff Communication:   Labs/tests ordered:

## 2016-03-01 NOTE — Progress Notes (Signed)
This encounter was created in error - please disregard.

## 2016-03-06 ENCOUNTER — Encounter: Payer: Self-pay | Admitting: Internal Medicine

## 2016-03-06 ENCOUNTER — Non-Acute Institutional Stay (SKILLED_NURSING_FACILITY): Payer: Medicare Other | Admitting: Internal Medicine

## 2016-03-06 DIAGNOSIS — I739 Peripheral vascular disease, unspecified: Secondary | ICD-10-CM

## 2016-03-06 DIAGNOSIS — I10 Essential (primary) hypertension: Secondary | ICD-10-CM | POA: Diagnosis not present

## 2016-03-06 DIAGNOSIS — E7800 Pure hypercholesterolemia, unspecified: Secondary | ICD-10-CM

## 2016-03-06 DIAGNOSIS — I2510 Atherosclerotic heart disease of native coronary artery without angina pectoris: Secondary | ICD-10-CM | POA: Diagnosis not present

## 2016-03-06 NOTE — Progress Notes (Signed)
Location:   [facility] Room Number: 111/W Place of Service:  SNF (31) Provider:  Georgeanna LeaArlo,Analiya Porco  GOLDING, JOHN CABOT, MD  Patient Care Team: Assunta FoundJohn Golding, MD as PCP - General (Family Medicine) Antoine PocheJonathan F Branch, MD as Consulting Physician (Cardiology) Cliffton AstersJohn Campbell, MD as Consulting Physician (Infectious Diseases) [facility] ([facility] Nursing Facility)  Extended Emergency Contact Information Primary Emergency Contact: [name],[name]  [place] Home Phone: [phone] Mobile Phone: [phone] Relation: None Secondary Emergency Contact: [name],April  [place] Home Phone: [phone] Relation: None  Code Status:  DNR Goals of care: Advanced Directive information Advanced Directives 03/06/2016  Does Patient Have a Medical Advance Directive? Yes  Type of Advance Directive Out of facility DNR (pink MOST or yellow form)  Does patient want to make changes to medical advance directive? No - Patient declined  Copy of Healthcare Power of Attorney in Chart? -  Would patient like information on creating a medical advance directive? No - Patient declined     Chief Complaint  Patient presents with  . Medical Management of Chronic Issues    Routine Visit  For medical management of chronic medical conditions including peripheral arterial disease-hypertension-atrial fibrillation-coronary artery disease-anemia-hypothyroidism.    HPI:  Pt is a 83 y.o. female seen today for medical management of chronic diseases. As noted above.  Most acute issue recently has been some foot wounds bilaterally but according to nursing these have largely stabilized she is receiving topical treatment including Santyl  apparently with good effect    Patient does have a history ofarthrectomy right peroneal artery, angioplasty of the right peroneal artery and tibioperoneal trunk on 01/04/16 and then underwent right great toe amputation on 01/05/16 by Dr.  Myra GianottiBrabham   She has completed a course of IV antibiotics and this appears to have stabilized  She also has a history of hypertension her Norvasc has gradually been titrated up currently on 5 mg a day it appears her systolic readings are still elevated it was 164/61 taken manually today. This morning-I took it this afternoon and it was 160/60  She does not complain of really any dizziness or headache chest pain or palpitations.  She is followed by cardiology for numerous medical issues including her hypertension-as well as history of atrial fibrillation and coronary artery disease.  Per most recent cardiology no she started to be relatively stable and to keep an eye on her hypertension.  A. fib is rate controlled she is on aspirin for anticoagulation.  And is on amiodarone for rate control.  She also has a history of anemia she is on iron CBC in February was stable at 10.1 hemoglobin will update this.  She also has a history hypothyroidism on Synthroid back in January TSH was normal at 3.131.  Currently she is sitting in her chair comfortably in her legs are actually propped up on the bed she is visiting with family member she has no complaints she appears to be doing well.  In regards to peripheral arterial disease she is followed by vascular surgery there are apparently going to be repeat ABIs done in about 3 months-her wounds are being treated topically and according in nursing again these do not appear to be infected and healing fairly well at this point.     Past Medical History:  Diagnosis Date  . Acid reflux   . Carotid artery occlusion    minimal  . Coronary artery disease   . Hypercholesteremia   . Hypertension   .  Myocardial infarct   . PAF (paroxysmal atrial fibrillation) (HCC)   . Skin disorder   . Systolic murmur   . Thyroid disease    Past Surgical History:  Procedure Laterality Date  . ABDOMINAL HYSTERECTOMY    . AMPUTATION Right 01/05/2016   Procedure: RIGHT  GREAT TOE AMPUTATION;  Surgeon: Nada Libman, MD;  Location: [facility] OR;  Service: Vascular;  Laterality: Right;  . CARDIAC CATHETERIZATION    . CARDIOVERSION  2012  . LOWER EXTREMITY ANGIOGRAM Right 12/30/2015   Procedure: RIGHT LOWER EXTREMITY ANGIOGRAM;  Surgeon: Ancil Linsey, MD;  Location: [facility] ORS;  Service: Vascular;  Laterality: Right;  accessed through left groin area - dressed with two 2x2 and 4x4 opsite  . PERIPHERAL VASCULAR CATHETERIZATION N/A 01/04/2016   Procedure: Lower Extremity Angiography;  Surgeon: Nada Libman, MD;  Location: [facility] INVASIVE CV LAB;  Service: Cardiovascular;  Laterality: N/A;  . PERIPHERAL VASCULAR CATHETERIZATION Right 01/04/2016   Procedure: Peripheral Vascular Atherectomy;  Surgeon: Nada Libman, MD;  Location: [facility] INVASIVE CV LAB;  Service: Cardiovascular;  Laterality: Right;  Peroneal  . PERIPHERAL VASCULAR CATHETERIZATION N/A 02/01/2016   Procedure: Abdominal Aortogram w/Lower Extremity;  Surgeon: Nada Libman, MD;  Location: [facility] INVASIVE CV LAB;  Service: Cardiovascular;  Laterality: N/A;  . PERIPHERAL VASCULAR CATHETERIZATION  02/01/2016   Procedure: Peripheral Vascular Atherectomy;  Surgeon: Nada Libman, MD;  Location: [facility] INVASIVE CV LAB;  Service: Cardiovascular;;  Left   . THYROID SURGERY      No Known Allergies  Current Outpatient Prescriptions on File Prior to Visit  Medication Sig Dispense Refill  . amiodarone (PACERONE) 200 MG tablet Take 0.5 tablets (100 mg total) by mouth daily. 15 tablet 8  . amLODipine (NORVASC) 5 MG tablet Take 1 tablet (5 mg total) by mouth daily. 30 tablet 0  . aspirin EC 81 MG tablet Take 1 tablet (81 mg total) by mouth daily. 90 tablet 3  . atorvastatin (LIPITOR) 20 MG tablet Take 20 mg by mouth every other day. Take 1 tablet (20 mg) by mouth every other night    . collagenase (SANTYL) ointment Apply 1 application topically daily.    . cyanocobalamin 1000 MCG tablet Take 1,000 mcg by mouth daily.    Marland Kitchen docusate  sodium (COLACE) 100 MG capsule Take 100 mg by mouth daily.    . ferrous sulfate (QC FERROUS SULFATE) 325 (65 FE) MG tablet Take 325 mg by mouth daily with breakfast.    . furosemide (LASIX) 20 MG tablet TAKE ONE TABLET BY MOUTH EVERY DAY AS NEEDED FOR SWELLING. 30 tablet 3  . levothyroxine (SYNTHROID, LEVOTHROID) 75 MCG tablet Take 75 mcg by mouth daily.      . magnesium hydroxide (MILK OF MAGNESIA) 400 MG/5ML suspension Take 30 mLs by mouth daily as needed for mild constipation.    Marland Kitchen omeprazole (PRILOSEC) 40 MG capsule Take 40 mg by mouth at bedtime.     No current facility-administered medications on file prior to visit.      Review of Systems   General does not complaining fever or chills .  Skin as history of lower extremity wounds as noted above  Head ears eyes nose mouth and throat does not complaining of any visual changes sore throat.  Respiratory denies shortness of breath or cough.  Cardiac is not complaining of any chest pain has had some lower extremity edema but this appears to have stabilized.  GI is not complaining of any nausea vomiting  or abdominal pain.  Musculoskeletal she has gained strength is not complaining of joint pain for pain this afternoon  Neurologic is not complaining of dizziness or headache  Psych is not complaining of anxiety or depression appears to be at baseline      There is no immunization history on file for this patient. Pertinent  Health Maintenance Due  Topic Date Due  . DEXA SCAN  02/09/1987  . INFLUENZA VACCINE  12/02/2016 (Originally 08/03/2015)  . PNA vac Low Risk Adult (1 of 2 - PCV13) 12/02/2016 (Originally 02[birth year]09/1987)   No flowsheet data found. Functional Status Survey:    Vitals:   03/06/16 1142  BP: (!) 164/61  Pulse: (!) 57  Resp: 18  Temp: 98.5 F (36.9 C)  TempSrc: Oral  SpO2: 93%  Pulse on exam today was in the low 60s--blood pressure this afternoon manually was 160/60 Weight appears relatively stable  at 117.4 pounds Physical Exam Gen. this is a pleasant elderly female in no distress sitting comfortably in her chair.   HENT:  Head: Normocephalic.  Mouth/Throat: Oropharynx is clear and moist.  Eyes: Sclera and conjunctiva are clear visual acuity appears grossly intact   Cardiovascular: Normal rateand normal heart sounds.  Pulmonary/Chest: Effort normaland breath sounds normal. No respiratory distress. She has no wheezes. She has no rales.  Abdominal: Soft. Bowel sounds are normal. She exhibits no distension. There is no tenderness. There is no rebound.  Musculoskeletal:  Is able to move all extremities 4 with baseline strength it appears  Neurological: She is grossly alert and oriented pleasant and appropriate--continues to be conversational  Moving all extremities wella Cranial nerves appear to be intact no lateralizing findings Skin: Skin is warmand dry. No rashnoted ( She does have wounds on her lower extremities which have been followed by wound care  She is status post right great toe amputation Heels bilaterally in proximal feet are currently wrapped again per nursing her wounds here appear to be healing well without sign of infection        Labs reviewed:  Recent Labs  01/27/16 1600 02/01/16 1344 02/03/16 0800 02/08/16 0930  NA 138 142 139 138  K 4.2 4.3 4.1 3.8  CL 103 103 104 106  CO2 27  --  28 26  GLUCOSE 90 88 85 111*  BUN 24* 24* 24* 31*  CREATININE 1.30* 1.20* 1.23* 1.27*  CALCIUM 8.8*  --  8.7* 8.7*    Recent Labs  01/17/16 1718  AST 22  ALT <5*  ALKPHOS 95  BILITOT 0.5  PROT 7.0  ALBUMIN 2.9*    Recent Labs  01/27/16 1600 02/01/16 1344 02/03/16 0800 02/08/16 0930  WBC 7.0  --  5.0 6.6  NEUTROABS 4.8  --  2.8 4.7  HGB 10.5* 10.9* 10.0* 10.1*  HCT 32.5* 32.0* 30.8* 31.3*  MCV 97.0  --  96.9 95.7  PLT 355  --  307 363   Lab Results  Component Value Date   TSH 3.131 01/13/2016   No results found for: HGBA1C No  results found for: CHOL, HDL, LDLCALC, LDLDIRECT, TRIG, CHOLHDL  Significant Diagnostic Results in last 30 days:  No results found.  Assessment/Plan  #1-peripheral arterial disease-see again this appears to have been normal most acute issue during her stay here wounds apparently are healing fairly unremarkably she has seen vascular surgery they are going to do ABIs in approximately 3 months-she is receiving topical treatment to her wounds apparently with effectiveness at this point will monitor.  #  2 hypertension she appears to have somewhat consistent elevated systolics her Norvasc has been titrated up will increase this up to 10 mg a day and continue to monitor.  #3 atrial fibrillation this appears rate controlled she is on amiodarone she is on aspirin for anticoagulation.  #4 history coronary artery disease she has seen cardiology and they recommended essentially secondary prevention no aggressive interventions at this point she is on aspirin and an statin she gets a statin every other day secondary apparently to tolerance issues.  #5 history of anemia per review of labs appears there is an element of iron deficiency/chronic disease here CBC back in early February showed a hemoglobin of 10.1 which appears to be relatively stable we will update this.  #6 history of hypothyroidism she is on Synthroid TSH 01/13/2016 was 3.131-and she is on amiodarone will update this to ensure stability.  #7 history hyperlipidemia she is on atorvastatin every other day apparently she is not able tolerate daily dosing-will update a lipid panel liver function tests in January were within normal limits will update this as well.  #8 history renal insufficiency creatinine 1.2 on lab done generally 15th 2018 appears relatively baseline Will update this as well.  [id number] note greater than 35 minutes spent assessing patient-discussing her status with nursing staff-reviewing her chart--consult -reviewing her  labs-and coordinating and formulating a plan of care for numerous diagnoses-of note greater than 50% of time spent coordinating plan of care

## 2016-03-07 ENCOUNTER — Encounter (HOSPITAL_COMMUNITY)
Admission: RE | Admit: 2016-03-07 | Discharge: 2016-03-07 | Disposition: A | Discharge status: Home / Self Care | Payer: Medicare Other | Source: Skilled Nursing Facility | Attending: Internal Medicine | Admitting: Internal Medicine

## 2016-03-07 DIAGNOSIS — I739 Peripheral vascular disease, unspecified: Secondary | ICD-10-CM | POA: Diagnosis present

## 2016-03-07 DIAGNOSIS — Z48812 Encounter for surgical aftercare following surgery on the circulatory system: Secondary | ICD-10-CM | POA: Diagnosis present

## 2016-03-07 DIAGNOSIS — I5022 Chronic systolic (congestive) heart failure: Secondary | ICD-10-CM | POA: Diagnosis present

## 2016-03-07 DIAGNOSIS — K219 Gastro-esophageal reflux disease without esophagitis: Secondary | ICD-10-CM | POA: Diagnosis present

## 2016-03-07 LAB — COMPREHENSIVE METABOLIC PANEL
ALK PHOS: 78 U/L (ref 38–126)
ALT: 10 U/L — ABNORMAL LOW (ref 14–54)
ANION GAP: 8 (ref 5–15)
AST: 13 U/L — ABNORMAL LOW (ref 15–41)
Albumin: 2.9 g/dL — ABNORMAL LOW (ref 3.5–5.0)
BUN: 34 mg/dL — ABNORMAL HIGH (ref 6–20)
CALCIUM: 8.8 mg/dL — AB (ref 8.9–10.3)
CO2: 27 mmol/L (ref 22–32)
Chloride: 104 mmol/L (ref 101–111)
Creatinine, Ser: 1.24 mg/dL — ABNORMAL HIGH (ref 0.44–1.00)
GFR calc non Af Amer: 36 mL/min — ABNORMAL LOW (ref >60)
GFR, EST AFRICAN AMERICAN: 42 mL/min — AB (ref >60)
Glucose, Bld: 83 mg/dL (ref 65–99)
Potassium: 3.8 mmol/L (ref 3.5–5.1)
SODIUM: 139 mmol/L (ref 135–145)
TOTAL PROTEIN: 6.5 g/dL (ref 6.5–8.1)
Total Bilirubin: 0.5 mg/dL (ref 0.3–1.2)

## 2016-03-07 LAB — CBC WITH DIFFERENTIAL/PLATELET
Basophils Absolute: 0 10*3/uL (ref 0.0–0.1)
Basophils Relative: 0 %
EOS ABS: 0.1 10*3/uL (ref 0.0–0.7)
Eosinophils Relative: 1 %
HCT: 32.4 % — ABNORMAL LOW (ref 36.0–46.0)
Hemoglobin: 10.3 g/dL — ABNORMAL LOW (ref 12.0–15.0)
LYMPHS ABS: 1.2 10*3/uL (ref 0.7–4.0)
LYMPHS PCT: 16 %
MCH: 28.9 pg (ref 26.0–34.0)
MCHC: 31.8 g/dL (ref 30.0–36.0)
MCV: 90.8 fL (ref 78.0–100.0)
Monocytes Absolute: 0.9 10*3/uL (ref 0.1–1.0)
Monocytes Relative: 13 %
NEUTROS ABS: 5.2 10*3/uL (ref 1.7–7.7)
Neutrophils Relative %: 70 %
Platelets: 388 10*3/uL (ref 150–400)
RBC: 3.57 MIL/uL — AB (ref 3.87–5.11)
RDW: 13.9 % (ref 11.5–15.5)
WBC: 7.4 10*3/uL (ref 4.0–10.5)

## 2016-03-07 LAB — LIPID PANEL
CHOL/HDL RATIO: 2.8 ratio
Cholesterol: 105 mg/dL (ref 0–200)
HDL: 38 mg/dL — AB (ref >40)
LDL Cholesterol: 57 mg/dL (ref 0–99)
Triglycerides: 50 mg/dL (ref <150)
VLDL: 10 mg/dL (ref 0–40)

## 2016-03-07 LAB — TSH: TSH: 2.487 u[IU]/mL (ref 0.350–4.500)

## 2016-03-10 ENCOUNTER — Encounter (HOSPITAL_COMMUNITY): Payer: Self-pay | Admitting: Surgery

## 2016-03-13 ENCOUNTER — Telehealth: Payer: Self-pay | Admitting: *Deleted

## 2016-03-13 NOTE — Telephone Encounter (Signed)
[facility]ly[facility]aren at [facility] called to see if Mrs. [name] could have her feet debrided by their Wound Care Specialist. After reviewing the patient's aortogram and 02-28-16 follow up office visit with out PA, along with asking Dr. Myra GianottiBrabham, it was determined that the patient could have debridement. I called Clydie BraunKaren [phone]) and left her a message of this plan of care.

## 2016-03-16 ENCOUNTER — Encounter: Payer: Self-pay | Admitting: Vascular Surgery

## 2016-03-20 ENCOUNTER — Encounter (HOSPITAL_COMMUNITY): Payer: Medicare Other

## 2016-03-20 ENCOUNTER — Ambulatory Visit: Payer: Medicare Other

## 2016-03-27 ENCOUNTER — Ambulatory Visit: Payer: Medicare Other | Admitting: Family

## 2016-03-28 ENCOUNTER — Ambulatory Visit (INDEPENDENT_AMBULATORY_CARE_PROVIDER_SITE_OTHER): Payer: Self-pay | Admitting: Family

## 2016-03-28 ENCOUNTER — Encounter: Payer: Self-pay | Admitting: Family

## 2016-03-28 VITALS — BP 140/62 | HR 64 | Temp 96.7°F | Resp 20 | Ht 65.0 in | Wt 127.5 lb

## 2016-03-28 DIAGNOSIS — Z89411 Acquired absence of right great toe: Secondary | ICD-10-CM

## 2016-03-28 DIAGNOSIS — Z9862 Peripheral vascular angioplasty status: Secondary | ICD-10-CM

## 2016-03-28 DIAGNOSIS — I779 Disorder of arteries and arterioles, unspecified: Secondary | ICD-10-CM

## 2016-03-28 NOTE — Progress Notes (Signed)
Vitals:   03/28/16 1209 03/28/16 1218  BP: (!) 142/67 140/62  Pulse: 64   Resp: 20   Temp: (!) 96.7 F (35.9 C)   TempSrc: Oral   SpO2: 100%   Weight: 127 lb 8 oz (57.8 kg)   Height: 5\' 5"  (1.651 m)

## 2016-03-28 NOTE — Patient Instructions (Signed)

## 2016-03-28 NOTE — Progress Notes (Signed)
Postoperative Visit   History of Present Illness  [name] is a 83 y.o. year old female who is s/p atherectomy right peroneal artery, angioplasty of the right peroneal artery and tibioperoneal trunk on 01/04/16 and then underwent right great toe amputation on 01/05/16 by Dr. Trula Slade .  She was last evaluated on 02-28-16 by [facility]. At that time the nursing facility was advised to continue santyl to the left heel, right lateral fifth met area, and lateral heel. Elevate the LE when at rest and include active range of motion of the feet, legs and toes in her daily routine.   Luke warm water and soap soaks 2 times per week both feet, then dry well and apply dressings as directed. Pt was to F/U in 4 weeks for wound check.  Plan repeat ABI's in 3 months from today.   She returns today for 4 weeks wound evaluation.   Pt states her feet ulcers are improving with daily dressing changes.  She is residing at [facility]. She states that she is walking with a walker minimally, but over all doing well. Pt denies fever or chills, denies pain.    For VQI Use Only  PRE-ADM LIVING: Nursing home  AMB STATUS: Wheelchair   Past Medical History:  Diagnosis Date  . Acid reflux   . Carotid artery occlusion    minimal  . Coronary artery disease   . Hypercholesteremia   . Hypertension   . Myocardial infarct   . PAF (paroxysmal atrial fibrillation) ([place])   . Skin disorder   . Systolic murmur   . Thyroid disease    Past Surgical History:  Procedure Laterality Date  . ABDOMINAL HYSTERECTOMY    . AMPUTATION Right 01/05/2016   Procedure: RIGHT GREAT TOE AMPUTATION;  Surgeon: Serafina Mitchell, MD;  Location: [facility];  Service: Vascular;  Laterality: Right;  . CARDIAC CATHETERIZATION    . CARDIOVERSION  2012  . IR GENERIC HISTORICAL  12/30/2015   IR ANGIOGRAM EXTREMITY BILATERAL 12/30/2015 [facility]  . IR GENERIC HISTORICAL  12/30/2015   IR ANGIOGRAM SELECTIVE EACH  ADDITIONAL VESSEL 12/30/2015 [facility]  . LOWER EXTREMITY ANGIOGRAM Right 12/30/2015   Procedure: RIGHT LOWER EXTREMITY ANGIOGRAM;  Surgeon: Vickie Epley, MD;  Location: [facility];  Service: Vascular;  Laterality: Right;  accessed through left groin area - dressed with two 2x2 and 4x4 opsite  . PERIPHERAL VASCULAR CATHETERIZATION N/A 01/04/2016   Procedure: Lower Extremity Angiography;  Surgeon: Serafina Mitchell, MD;  Location: [facility];  Service: Cardiovascular;  Laterality: N/A;  . PERIPHERAL VASCULAR CATHETERIZATION Right 01/04/2016   Procedure: Peripheral Vascular Atherectomy;  Surgeon: Serafina Mitchell, MD;  Location: [facility] [facility];  Service: Cardiovascular;  Laterality: Right;  Peroneal  . PERIPHERAL VASCULAR CATHETERIZATION N/A 02/01/2016   Procedure: Abdominal Aortogram w/Lower Extremity;  Surgeon: Serafina Mitchell, MD;  Location: [facility];  Service: Cardiovascular;  Laterality: N/A;  . PERIPHERAL VASCULAR CATHETERIZATION  02/01/2016   Procedure: Peripheral Vascular Atherectomy;  Surgeon: Serafina Mitchell, MD;  Location: [place];  Service: Cardiovascular;;  Left   . THYROID SURGERY      Social History   Social History  . Marital status: Widowed    Spouse name: N/A  . Number of children: N/A  . Years of education: N/A   Occupational History  . Not on file.   Social History Main Topics  . Smoking status: Never  Smoker  . Smokeless tobacco: Never Used  . Alcohol use No  . Drug use: No  . Sexual activity: No   Other Topics Concern  . Not on file   Social History Narrative  . No narrative on file    No Known Allergies  Current Outpatient Prescriptions on File Prior to Visit  Medication Sig Dispense Refill  . amiodarone (PACERONE) 200 MG tablet Take 0.5 tablets (100 mg total) by mouth daily. 15 tablet 8  . amLODipine (NORVASC) 5 MG tablet Take 1 tablet (5 mg total) by mouth daily. 30 tablet 0  . aspirin EC 81 MG tablet Take 1 tablet (81  mg total) by mouth daily. 90 tablet 3  . atorvastatin (LIPITOR) 20 MG tablet Take 20 mg by mouth every other day. Take 1 tablet (20 mg) by mouth every other night    . collagenase (SANTYL) ointment Apply 1 application topically daily.    . cyanocobalamin 1000 MCG tablet Take 1,000 mcg by mouth daily.    Marland Kitchen docusate sodium (COLACE) 100 MG capsule Take 100 mg by mouth daily.    . ferrous sulfate (QC FERROUS SULFATE) 325 (65 FE) MG tablet Take 325 mg by mouth daily with breakfast.    . furosemide (LASIX) 20 MG tablet TAKE ONE TABLET BY MOUTH EVERY DAY AS NEEDED FOR SWELLING. 30 tablet 3  . levothyroxine (SYNTHROID, LEVOTHROID) 75 MCG tablet Take 75 mcg by mouth daily.      . magnesium hydroxide (MILK OF MAGNESIA) 400 MG/5ML suspension Take 30 mLs by mouth daily as needed for mild constipation.    Marland Kitchen omeprazole (PRILOSEC) 40 MG capsule Take 40 mg by mouth at bedtime.     No current facility-administered medications on file prior to visit.      Physical Examination  Vitals:   03/28/16 1209 03/28/16 1218  BP: (!) 142/67 140/62  Pulse: 64   Resp: 20   Temp: (!) 96.7 F (35.9 C)   TempSrc: Oral   SpO2: 100%   Weight: 127 lb 8 oz (57.8 kg)   Height: _0  (1.651 m)    Body mass index is 21.22 kg/m.  Bilateral femoral pulses are 2+ palpable. Bilateral DP and PT pulses are audible by Doppler. Left heel ulcer measures 4 cm x 5 cm, about 2-3 cm depth Right heel ulcer measures 2.5 cm x 3 cm, about 2-3 mm depth Right foot lateral ulcer measures 1 cm x 1.5 cm, about 3-4 mm depth Thin layer of non purulent yellow tissue at the base of both heel ulcers.  Right great toe is surgically absent, this site is well healed.    Medical Decision Making  [name] is a 83 y.o. year old female who presents s/p atherectomy of right peroneal artery, angioplasty of the right peroneal artery and tibioperoneal trunk on 01/04/16 and then underwent right great toe amputation on 01/05/16.   Continue daily  Santyl dressing changes to all 3 foot wounds, but do not uses non stick dressing material on wound bed, use dressing material that will debride wound when removed. Keep pressure off both heels. Wound care team may continue to manually debride feet ulcers as needed.  Dr. Donnetta Hutching spoke with pt and son and examined pt.  Return in 2 weeks for feet ulcer recheck, ABI's due in May 2018.   The patient's bypass incisions are healing appropriately with resolution of pre-operative symptoms. I discussed in depth with the patient the nature of atherosclerosis, and emphasized the importance  of maximal medical management including strict control of blood pressure, blood glucose, and lipid levels, obtaining regular exercise, and cessation of smoking.  The patient is aware that without maximal medical management the underlying atherosclerotic disease process will progress, limiting the benefit of any interventions.   Thank you for allowing [place] to participate in this patient's care.  Stacey Sago, Sharmon Leyden, RN, MSN, FNP-C [facility] Office: [phone]  03/28/2016, 12:31 PM  Clinic MD: Early

## 2016-03-29 ENCOUNTER — Encounter: Payer: Self-pay | Admitting: Family

## 2016-04-06 ENCOUNTER — Non-Acute Institutional Stay (SKILLED_NURSING_FACILITY): Payer: Medicare Other | Admitting: Internal Medicine

## 2016-04-06 ENCOUNTER — Encounter: Payer: Self-pay | Admitting: Internal Medicine

## 2016-04-06 DIAGNOSIS — N183 Chronic kidney disease, stage 3 unspecified: Secondary | ICD-10-CM

## 2016-04-06 DIAGNOSIS — I10 Essential (primary) hypertension: Secondary | ICD-10-CM | POA: Diagnosis not present

## 2016-04-06 DIAGNOSIS — E039 Hypothyroidism, unspecified: Secondary | ICD-10-CM | POA: Diagnosis not present

## 2016-04-06 DIAGNOSIS — I779 Disorder of arteries and arterioles, unspecified: Secondary | ICD-10-CM

## 2016-04-06 DIAGNOSIS — I480 Paroxysmal atrial fibrillation: Secondary | ICD-10-CM | POA: Diagnosis not present

## 2016-04-06 DIAGNOSIS — R600 Localized edema: Secondary | ICD-10-CM | POA: Diagnosis not present

## 2016-04-06 DIAGNOSIS — E7800 Pure hypercholesterolemia, unspecified: Secondary | ICD-10-CM | POA: Diagnosis not present

## 2016-04-06 NOTE — Progress Notes (Signed)
Location:   [facility] Room Number: 111/W Place of Service:  SNF (31) Provider:  Marty Heck, MD  Patient Care Team: Assunta Found, MD as PCP - General (Family Medicine) Antoine Poche, MD as Consulting Physician (Cardiology) Cliffton Asters, MD as Consulting Physician (Infectious Diseases) [facility] (Skilled Nursing Facility)  Extended Emergency Contact Information Primary Emergency Contact: Casimer Lanius [place] Home Phone: [phone] Mobile Phone: [phone] Relation: None Secondary Emergency Contact: Margarito Courser [place] Home Phone: [phone] Relation: None  Code Status:  DNR Goals of care: Advanced Directive information Advanced Directives 04/06/2016  Does Patient Have a Medical Advance Directive? Yes  Type of Advance Directive Out of facility DNR (pink MOST or yellow form)  Does patient want to make changes to medical advance directive? No - Patient declined  Copy of Healthcare Power of Attorney in Chart? -  Would patient like information on creating a medical advance directive? No - Patient declined     Chief Complaint  Patient presents with  . Medical Management of Chronic Issues    Routine Visit    HPI:  Pt is a 83 y.o. female seen today for medical management of chronic diseases.    Patient has Patient has H/O CAD with Cath done in 2008 on Medical management, Paroxysmal Atrial Fibrillation Not on any Anticoagulation, Hypertension, Hyperlipidemia.  She was initially admitted after Staph Lugdenesis Bacteremia in 12/25. She was treated with Ancef for 4 weeks with PICC line.  Patient also underwent arthrectomy right peroneal artery, then underwent right great toe amputation on 01/05/16 by Dr. Myra Gianotti. Due to new wounds in Left leg she underwent Atherectomy of left Peroneal Artery and Left Tibioperoneal Trunk on 02/01/16. Patient has been residing in [facility] since then. Her  wounds on Both Legs have done well and are now heeling. She continues to have 2 ulcers in the heel of both LE. Followedby Vascular surgery. She also get wound debridement as needed by Wound care team in the facility.  Her Other issue has been Hypertension which is better controlled since her Norvasc dose has been increased. She also has gained weight . Her weight has recently in past 2 weeks go up to 127lbs from her baseline weight of 121lbs. pateitn has also noticed some increased swelling in her Feet. Her pain is controlled. She is eating well. She is learning to walk with the walker. Her family has decided that she will continue to stay in LT facility.       Past Medical History:  Diagnosis Date  . Acid reflux   . Carotid artery occlusion    minimal  . Coronary artery disease   . Hypercholesteremia   . Hypertension   . Myocardial infarct   . PAF (paroxysmal atrial fibrillation) ([facility])   . Skin disorder   . Systolic murmur   . Thyroid disease    Past Surgical History:  Procedure Laterality Date  . ABDOMINAL HYSTERECTOMY    . AMPUTATION Right 01/05/2016   Procedure: RIGHT GREAT TOE AMPUTATION;  Surgeon: Nada Libman, MD;  Location: [facility];  Service: Vascular;  Laterality: Right;  . CARDIAC CATHETERIZATION    . CARDIOVERSION  2012  . IR GENERIC HISTORICAL  12/30/2015   IR ANGIOGRAM EXTREMITY BILATERAL 12/30/2015 [facility]  . IR GENERIC HISTORICAL  12/30/2015   IR ANGIOGRAM SELECTIVE EACH ADDITIONAL VESSEL 12/30/2015 [facility]  . LOWER EXTREMITY ANGIOGRAM Right 12/30/2015   Procedure: RIGHT LOWER  EXTREMITY ANGIOGRAM;  Surgeon: Ancil Linsey, MD;  Location[facility];  Service: Vascular;  Laterality: Right;  accessed through left groin area - dressed with two 2x2 and 4x4 opsite  . PERIPHERAL VASCULAR CATHETERIZATION N/A 01/04/2016   Procedure: Lower Extremity Angiography;  Surgeon: Nada Libman, MD;  Location: [facility] INVASIVE CV LAB;  Service: Cardiovascular;   Laterality: N/A;  . PERIPHERAL VASCULAR CATHETERIZATION Right 01/04/2016   Procedure: Peripheral Vascular Atherectomy;  Surgeon: Nada Libman, MD;  Location: [facility] INVAS[facility] LAB;  Service: Cardiovascular;  Laterality: Right;  Peroneal  . PERIPHERAL VASCULAR CATHETERIZATION N/A 02/01/2016   Procedure: Abdominal Aortogram w/Lower Extremity;  Surgeon: Nada Libman, MD;  Location: [facility] INVASIVE CV LAB;  Service: Cardiovascular;  Laterality: N/A;  . PERIPHERAL VASCULAR CATHETERIZATION  02/01/2016   Procedure: Peripheral Vascular Atherectomy;  Surgeon: Nada Libman, MD;  Location: [facility]AS[facility];  Service: Cardiovascular;;  Left   . THYROID SURGERY      No Known Allergies  Current Outpatient Prescriptions on File Prior to Visit  Medication Sig Dispense Refill  . amiodarone (PACERONE) 200 MG tablet Take 0.5 tablets (100 mg total) by mouth daily. 15 tablet 8  . aspirin EC 81 MG tablet Take 1 tablet (81 mg total) by mouth daily. 90 tablet 3  . atorvastatin (LIPITOR) 20 MG tablet Take 20 mg by mouth every other day. Take 1 tablet (20 mg) by mouth every other night    . collagenase (SANTYL) ointment Apply 1 application topically daily.    Marland Kitchen docusate sodium (COLACE) 100 MG capsule Take 100 mg by mouth daily.    . ferrous sulfate (QC FERROUS SULFATE) 325 (65 FE) MG tablet Take 325 mg by mouth daily with breakfast.    . furosemide (LASIX) 20 MG tablet TAKE ONE TABLET BY MOUTH EVERY DAY AS NEEDED FOR SWELLING. 30 tablet 3  . levothyroxine (SYNTHROID, LEVOTHROID) 75 MCG tablet Take 75 mcg by mouth daily.      . magnesium hydroxide (MILK OF MAGNESIA) 400 MG/5ML suspension Take 30 mLs by mouth daily as needed for mild constipation.    Marland Kitchen omeprazole (PRILOSEC) 40 MG capsule Take 40 mg by mouth at bedtime.     No current facility-administered medications on file prior to visit.      Review of Systems  Constitutional: Negative.   HENT: Negative.   Respiratory: Negative.   Cardiovascular: Positive  for leg swelling. Negative for chest pain.  Gastrointestinal: Positive for constipation. Negative for abdominal distention and abdominal pain.  Genitourinary: Negative.   Musculoskeletal: Negative.   Neurological: Negative.   Psychiatric/Behavioral: Negative.      There is no immunization history on file for this patient. Pertinent  Health Maintenance Due  Topic Date Due  . DEXA SCAN  02/09/1987  . INFLUENZA VACCINE  12/02/2016 (Originally 08/02/2016)  . PNA vac Low Risk Adult (1 of 2 - PCV13) 12/02/2016 (Originally 02/09/1987)   No flowsheet data found. Functional Status Survey:    Vitals:   04/06/16 1554  BP: 137/61  Pulse: 60  Resp: 20  Temp: 97.2 F (36.2 C)  TempSrc: Oral  SpO2: 97%   There is no height or weight on file to calculate BMI. Physical Exam  Constitutional: She is oriented to person, place, and time. She appears well-developed and well-nourished.  HENT:  Head: Normocephalic.  Mouth/Throat: Oropharynx is clear and moist.  Eyes: Pupils are equal, round, and reactive to light.  Neck: Neck supple.  Cardiovascular: Normal rate and  normal heart sounds.   Pulmonary/Chest: Effort normal and breath sounds normal. No respiratory distress. She has no wheezes. She has no rales.  Abdominal: Bowel sounds are normal. She exhibits no distension. There is no tenderness. There is no rebound.  Musculoskeletal:  Has moderate edema b/l Her both Wounds just had Dressing changed.  Neurological: She is alert and oriented to person, place, and time.  Skin: Skin is warm and dry.  Psychiatric: She has a normal mood and affect. Her behavior is normal. Thought content normal.    Labs reviewed:  Recent Labs  02/03/16 0800 02/08/16 0930 03/07/16 0600  NA 139 138 139  K 4.1 3.8 3.8  CL 104 106 104  CO2 GLUCOSE 85 111* 83  BUN 24* 31* 34*  CREATININE 1.23* 1.27* 1.24*  CALCIUM 8.7* 8.7* 8.8*    Recent Labs  01/17/16 1718 03/07/16 0600  AST 22 13*  ALT <5*  10*  ALKPHOS 95 78  BILITOT 0.5 0.5  PROT 7.0 6.5  ALBUMIN 2.9* 2.9*    Recent Labs  02/03/16 0800 02/08/16 0930 03/07/16 0700  WBC 5.0 6.6 7.4  NEUTROABS 2.8 4.7 5.2  HGB 10.0* 10.1* 10.3*  HCT 30.8* 31.3* 32.4*  MCV 96.9 95.7 90.8  PLT 307 363 388   Lab Results  Component Value Date   TSH 2.487 03/07/2016   No results found for: HGBA1C Lab Results  Component Value Date   CHOL 105 03/07/2016   HDL 38 (L) 03/07/2016   LDLCALC 57 03/07/2016   TRIG 50 03/07/2016   CHOLHDL 2.8 03/07/2016    Significant Diagnostic Results in last 30 days:  No results found.  Assessment/Plan PAOD (peripheral arterial Disease   Still has wounds on the heel but doing well. She is followed by Vascular  Essential hypertension BP is better now on Norvasc. Her weight has slowly gone up will start him on Lasix 20 mg which will also help his BP  Paroxysmal atrial fibrillation  Rate controlled Never been on Anticoagulation continue aspirin and amiodarone  Bilateral leg edema Weight has gone up by 6 lbs Will restart her on Lasix 20 mg  Chronic renal insufficiency, stage 3 (moderate) Creat stable Repeat labs on Lasix  Hyperlipidemia LDL 57 on statin  Hypothyroidism TSH normal continue same dose of Synthroid.  Constipation Start on Senna  Family/ staff Communication:   Labs/tests ordered:    Bmp in Few days to follow BMP in lasix

## 2016-04-09 ENCOUNTER — Encounter: Payer: Self-pay | Admitting: Internal Medicine

## 2016-04-09 DIAGNOSIS — I480 Paroxysmal atrial fibrillation: Secondary | ICD-10-CM

## 2016-04-10 ENCOUNTER — Encounter: Payer: Self-pay | Admitting: Family

## 2016-04-10 ENCOUNTER — Encounter (HOSPITAL_COMMUNITY)
Admission: RE | Admit: 2016-04-10 | Discharge: 2016-04-10 | Disposition: A | Discharge status: Home / Self Care | Payer: Medicare Other | Source: Skilled Nursing Facility | Attending: Internal Medicine | Admitting: Internal Medicine

## 2016-04-10 ENCOUNTER — Ambulatory Visit (INDEPENDENT_AMBULATORY_CARE_PROVIDER_SITE_OTHER): Payer: Medicare Other | Admitting: Family

## 2016-04-10 VITALS — BP 139/63 | HR 65 | Temp 97.3°F | Resp 16 | Ht 65.0 in | Wt 122.0 lb

## 2016-04-10 DIAGNOSIS — I739 Peripheral vascular disease, unspecified: Secondary | ICD-10-CM | POA: Insufficient documentation

## 2016-04-10 DIAGNOSIS — I5022 Chronic systolic (congestive) heart failure: Secondary | ICD-10-CM | POA: Insufficient documentation

## 2016-04-10 DIAGNOSIS — Z9862 Peripheral vascular angioplasty status: Secondary | ICD-10-CM | POA: Diagnosis not present

## 2016-04-10 DIAGNOSIS — Z48812 Encounter for surgical aftercare following surgery on the circulatory system: Secondary | ICD-10-CM | POA: Diagnosis not present

## 2016-04-10 DIAGNOSIS — Z89411 Acquired absence of right great toe: Secondary | ICD-10-CM | POA: Diagnosis not present

## 2016-04-10 DIAGNOSIS — I779 Disorder of arteries and arterioles, unspecified: Secondary | ICD-10-CM

## 2016-04-10 DIAGNOSIS — K219 Gastro-esophageal reflux disease without esophagitis: Secondary | ICD-10-CM | POA: Diagnosis not present

## 2016-04-10 LAB — CBC WITH DIFFERENTIAL/PLATELET
BASOS ABS: 0 10*3/uL (ref 0.0–0.1)
BASOS PCT: 0 %
Eosinophils Absolute: 0.1 10*3/uL (ref 0.0–0.7)
Eosinophils Relative: 3 %
HEMATOCRIT: 31.7 % — AB (ref 36.0–46.0)
HEMOGLOBIN: 9.9 g/dL — AB (ref 12.0–15.0)
LYMPHS ABS: 1.1 10*3/uL (ref 0.7–4.0)
LYMPHS PCT: 26 %
MCH: 27 pg (ref 26.0–34.0)
MCHC: 31.2 g/dL (ref 30.0–36.0)
MCV: 86.4 fL (ref 78.0–100.0)
Monocytes Absolute: 0.5 10*3/uL (ref 0.1–1.0)
Monocytes Relative: 12 %
NEUTROS ABS: 2.5 10*3/uL (ref 1.7–7.7)
NEUTROS PCT: 59 %
Platelets: 317 10*3/uL (ref 150–400)
RBC: 3.67 MIL/uL — ABNORMAL LOW (ref 3.87–5.11)
RDW: 14.7 % (ref 11.5–15.5)
WBC: 4.1 10*3/uL (ref 4.0–10.5)

## 2016-04-10 LAB — BASIC METABOLIC PANEL
ANION GAP: 8 (ref 5–15)
BUN: 36 mg/dL — ABNORMAL HIGH (ref 6–20)
CALCIUM: 8.3 mg/dL — AB (ref 8.9–10.3)
CHLORIDE: 107 mmol/L (ref 101–111)
CO2: 26 mmol/L (ref 22–32)
Creatinine, Ser: 1.29 mg/dL — ABNORMAL HIGH (ref 0.44–1.00)
GFR calc Af Amer: 40 mL/min — ABNORMAL LOW (ref >60)
GFR calc non Af Amer: 34 mL/min — ABNORMAL LOW (ref >60)
Glucose, Bld: 77 mg/dL (ref 65–99)
Potassium: 4 mmol/L (ref 3.5–5.1)
Sodium: 141 mmol/L (ref 135–145)

## 2016-04-10 NOTE — Progress Notes (Signed)
[facility] [place]   CC: Follow up peripheral artery occlusive disease  History of Present Illness [name] is a 83 y.o. female who is s/p atherectomy right peroneal artery, angioplasty of the right peroneal artery and tibioperoneal trunk on 01/04/16 and then underwent right great toe amputation on 01/05/16 by Dr. Myra Gianotti.  I last evaluated pt on 03-28-16. At that time her bilateral heel and lateral right foot ulcers were improving slightly, but thin layer of fibrinous exudate at the bed of the ulcers did not seem to get debrided with dressing changes.   She returns today for 2 weeks wound evaluation.   Pt states her feet ulcers are improving with daily Santyl and regular gauze dressing changes.   She is residing at [facility]She states that she is walking with a walker minimally as she needs to keep pressure off of her heels and feet, therefore cannot wear shoes, but over alldoing well. Pt denies fever or chills, denies pain.   Pt Diabetic: No Pt smoker: non-smoker  Pt meds include: Statin :Yes Betablocker: No ASA: Yes Other anticoagulants/antiplatelets: no  Past Medical History:  Diagnosis Date  . Acid reflux   . Carotid artery occlusion    minimal  . Coronary artery disease   . Hypercholesteremia   . Hypertension   . Myocardial infarct   . PAF (paroxysmal atrial fibrillation) (HCC)   . Skin disorder   . Systolic murmur   . Thyroid disease     Social History Social History  Substance Use Topics  . Smoking status: Never Smoker  . Smokeless tobacco: Never Used  . Alcohol use No    Family History Family History  Problem Relation Age of Onset  . Family history unknown: Yes    Past Surgical History:  Procedure Laterality Date  . ABDOMINAL HYSTERECTOMY    . AMPUTATION Right 01/05/2016   Procedure: RIGHT GREAT TOE AMPUTATION;  Surgeon: Nada Libman, MD;  Location: [facility] OR;  Service: Vascular;  Laterality: Right;  . CARDIAC  CATHETERIZATION    . CARDIOVERSION  2012  . IR GENERIC HISTORICAL  12/30/2015   IR ANGIOGRAM EXTREMITY BILATERAL 12/30/2015 [facility]  . IR GENERIC HISTORICAL  12/30/2015   IR ANGIOGRAM SELECTIVE EACH ADDITIONAL VESSEL 12/30/2015 [facility]  . LOWER EXTREMITY ANGIOGRAM Right 12/30/2015   Procedure: RIGHT LOWER EXTREMITY ANGIOGRAM;  Surgeon: Ancil Linsey, MD;  Location: [facility];  Service: Vascular;  Laterality: Right;  accessed through left groin area - dressed with two 2x2 and 4x4 opsite  . PERIPHERAL VASCULAR CATHETERIZATION N/A 01/04/2016   Procedure: Lower Extremity Angiography;  Surgeon: Nada Libman, MD;  Location: [facility] INVASIVE CV LAB;  Service: Cardiovascular;  Laterality: N/A;  . PERIPHERAL VASCULAR CATHETERIZATION Right 01/04/2016   Procedure: Peripheral Vascular Atherectomy;  Surgeon: Nada Libman, MD;  Location: [facility] INVASIVE CV LAB;  Service: Cardiovascular;  Laterality: Right;  Peroneal  . PERIPHERAL VASCULAR CATHETERIZATION N/A 02/01/2016   Procedure: Abdominal Aortogram w/Lower Extremity;  Surgeon: Nada Libman, MD;  Location: [facility] INVASIVE CV LAB;  Service: Cardiovascular;  Laterality: N/A;  . PERIPHERAL VASCULAR CATHETERIZATION  02/01/2016   Procedure: Peripheral Vascular Atherectomy;  Surgeon: Nada Libman, MD;  Location: [facility] INVASIVE CV LAB;  Service: Cardiovascular;;  Left   . THYROID SURGERY      No Known Allergies   Current Outpatient Prescriptions on File Prior to Visit  Medication Sig Dispense Refill  . acetaminophen (TYLENOL) 325 MG tablet Take 650  mg by mouth every 6 (six) hours as needed.    Marland Kitchen amiodarone (PACERONE) 200 MG tablet Take 0.5 tablets (100 mg total) by mouth daily. 15 tablet 8  . amLODipine (NORVASC) 10 MG tablet Take 10 mg by mouth daily.    Marland Kitchen aspirin EC 81 MG tablet Take 1 tablet (81 mg total) by mouth daily. 90 tablet 3  . atorvastatin (LIPITOR) 20 MG tablet Take 20 mg by mouth every other day. Take 1 tablet (20 mg) by mouth every  other night    . collagenase (SANTYL) ointment Apply 1 application topically daily.    Marland Kitchen docusate sodium (COLACE) 100 MG capsule Take 100 mg by mouth daily.    . ferrous sulfate (QC FERROUS SULFATE) 325 (65 FE) MG tablet Take 325 mg by mouth daily with breakfast.    . furosemide (LASIX) 20 MG tablet TAKE ONE TABLET BY MOUTH EVERY DAY AS NEEDED FOR SWELLING. 30 tablet 3  . levothyroxine (SYNTHROID, LEVOTHROID) 75 MCG tablet Take 75 mcg by mouth daily.      . magnesium hydroxide (MILK OF MAGNESIA) 400 MG/5ML suspension Take 30 mLs by mouth daily as needed for mild constipation.    Marland Kitchen omeprazole (PRILOSEC) 40 MG capsule Take 40 mg by mouth at bedtime.    . vitamin B-12 (CYANOCOBALAMIN) 1000 MCG tablet Take 1,000 mcg by mouth daily.     No current facility-administered medications on file prior to visit.      ROS: See HPI for pertinent positives and negatives.   Physical Examination  Vitals:   04/10/16 1056  BP: 139/63  Pulse: 65  Resp: 16  Temp: 97.3 F (36.3 C)  TempSrc: Oral  SpO2: 97%  Weight: 122 lb (55.3 kg)  Height:  (1.651 m)   Body mass index is 20.3 kg/m.  General: A&O x 3, WDWN, thin elderly female that appears younger than her stated age. Gait: seated in w/c Eyes: PERRLA. Pulmonary: Respirations are non labored, CTAB, good air movement Cardiac: regular rhythm, no detected murmur.         Carotid Bruits Right Left   Negative Negative  Aorta is not palpable. Radial pulses: are 2+ palpable bilaterally                            VASCULAR EXAM: Extremities with ischemic changes, without Gangrene; with open wounds.  Left heel ulcer measures 8 cm x 4 cm, about 4 mm depth Right heel ulcer measures 3 cm x 3 cm, about 2-3 mm depth Right foot lateral ulcer measures 2 cm x 2 cm, about 3-4 mm depth, edges are contracting.  Thin sparse patches of non purulent yellow fibrinous tissue interspersed with red healthy appearing granulation tissue, at the base of both heel  ulcers.  Right great toe is surgically absent, this site is well healed.  LE Pulses Right Left       FEMORAL  2+ palpable  2+ palpable        POPLITEAL  not palpable   not palpable       POSTERIOR TIBIAL  not palpable   not palpable        DORSALIS PEDIS      ANTERIOR TIBIAL not palpable  not palpable    Abdomen: soft, NT, no palpable masses. Skin: no rashes, See Extremities. Musculoskeletal: no muscle wasting or atrophy.  Neurologic: A&O X 3; Appropriate Affect ; SENSATION: normal; MOTOR FUNCTION:  moving all extremities equally, motor strength 5/5 throughout. Speech is fluent/normal. CN 2-12 intact.    Non-Invasive Vascular Imaging:  ABI: (02-28-16); Right: 0.95 (falsely elevated), waveforms: monophasic; no TBI due to s/p amputation right great toe Left: 0.95 (falsely elevated), waveforms: monophasic; TBI: 0.26 No prior ABI's Bilateral ABI are not reliable due to lack of correlation between ankle pressures and Doppler waveforms which is most likely a result of medial calcification.   Bilateral LE arterial Duplex (02-28-16): Technically difficult exam due to patient's inability to be properly positioned and calcific plaque throughout bilaterally preventing thorough visualization.  Unable to visualize a focal stenosis in the right LE, color flow disturbance noted throughout, with doubling of velocity in the distal SFA suggestive of 50-74% stenosis. Unable to visualize a focal stenosis in the left LE, color flow disturbance noted throughout with an increased velocity in the mid SFA in the 50-77% range.  All waveforms are monophasic except bilateral common femoral artery which are biphasic.   ASSESSMENT: Ioana ADDI PAK is a 83 y.o. female who presents s/p atherectomy of right peroneal artery, angioplasty of the right peroneal artery and tibioperoneal trunk on 01/04/16 and then  underwent right great toe amputation on 01/05/16.   Bilateral heel and lateral right foot ulcers are granulating slowly; lateral right foot ulcer is slowly contracting.  She is unable to wear shoes to walk until heel and lateral right foot ulcers heel, see Plan.   Continue Santyl dressing changes to all 3 foot wounds, increase frequency to bid, but do not use non stick dressing material on wound bed, use dressing material that will debride wound when removed. Keep pressure off both heels. Wound care team may continue to manually debride feet ulcers as needed.    PLAN:  Daily seated leg and arm exercises as discussed with pt and son and demonstrated.  Based on the patient's vascular studies and examination, pt will return to clinic in 1 month with ABI's and bilateral feet ulcer evaluation.  I discussed in depth with the patient the nature of atherosclerosis, and emphasized the importance of maximal medical management including strict control of blood pressure, blood glucose, and lipid levels, obtaining regular exercise, and continued cessation of smoking.  The patient is aware that without maximal medical management the underlying atherosclerotic disease process will progress, limiting the benefit of any interventions.  The patient was given information about PAD including signs, symptoms, treatment, what symptoms should prompt the patient to seek immediate medical care, and risk reduction measures to take.  Charisse March, RN, MSN, FNP-C [facility] [place] Phone: [phone]  Clinic MD: Myra Gianotti  04/10/16 11:22 AM

## 2016-04-10 NOTE — Patient Instructions (Signed)

## 2016-04-10 NOTE — Progress Notes (Signed)
Location:  [facility]   Place of Service:  SNF (31) Provider  Colette Ribas, MD  Patient Care Team: Assunta Found, MD as PCP - General (Family Medicine) Antoine Poche, MD as Consulting Physician (Cardiology) Cliffton Asters, MD as Consulting Physician (Infectious Diseases) [facility] (Skilled Nursing Facility)  Extended Emergency Contact Information Primary Emergency Contact: Casimer Lanius [place] Home Phone: [phone] Mobile Phone: [phone] Relation: None Secondary Emergency Contact: Margarito Courser [place] Home Phone: [phone] Relation: None  Code Status Goals of care: Advanced Directive information Advanced Directives 04/06/2016  Does Patient Have a Medical Advance Directive? Yes  Type of Advance Directive Out of facility DNR (pink MOST or yellow form)  Does patient want to make changes to medical advance directive? No - Patient declined  Copy of Healthcare Power of Attorney in Chart? -  Would patient like information on creating a medical advance directive? No - Patient declined     No chief complaint on file.   HPI:  Pt is a 83 y.o. female seen today for an acute visit for    Past Medical History:  Diagnosis Date  . Acid reflux   . Carotid artery occlusion    minimal  . Coronary artery disease   . Hypercholesteremia   . Hypertension   . Myocardial infarct   . PAF (paroxysmal atrial fibrillation) (HCC)   . Skin disorder   . Systolic murmur   . Thyroid disease    Past Surgical History:  Procedure Laterality Date  . ABDOMINAL HYSTERECTOMY    . AMPUTATION Right 01/05/2016   Procedure: RIGHT GREAT TOE AMPUTATION;  Surgeon: Nada Libman, MD;  Location: [facility] OR;  Service: Vascular;  Laterality: Right;  . CARDIAC CATHETERIZATION    . CARDIOVERSION  2012  . IR GENERIC HISTORICAL  12/30/2015   IR ANGIOGRAM EXTREMITY BILATERAL 12/30/2015 [facility]  . IR GENERIC HISTORICAL   12/30/2015   IR ANGIOGRAM SELECTIVE EACH ADDITIONAL VESSEL 12/30/2015 [facility]  . LOWER EXTREMITY ANGIOGRAM Right 12/30/2015   Procedure: RIGHT LOWER EXTREMITY ANGIOGRAM;  Surgeon: Ancil Linsey, MD;  Location: [facility] ORS;  Service: Vascular;  Laterality: Right;  accessed through left groin area - dressed with two 2x2 and 4x4 opsite  . PERIPHERAL VASCULAR CATHETERIZATION N/A 01/04/2016   Procedure: Lower Extremity Angiography;  Surgeon: Nada Libman, MD;  Location: [facility] INVASIVE CV LAB;  Service: Cardiovascular;  Laterality: N/A;  . PERIPHERAL VASCULAR CATHETERIZATION Right 01/04/2016   Procedure: Peripheral Vascular Atherectomy;  Surgeon: Nada Libman, MD;  Location: [facility] INVASIVE CV LAB;  Service: Cardiovascular;  Laterality: Right;  Peroneal  . PERIPHERAL VASCULAR CATHETERIZATION N/A 02/01/2016   Procedure: Abdominal Aortogram w/Lower Extremity;  Surgeon: Nada Libman, MD;  Location: [facility] INVASIVE CV LAB;  Service: Cardiovascular;  Laterality: N/A;  . PERIPHERAL VASCULAR CATHETERIZATION  02/01/2016   Procedure: Peripheral Vascular Atherectomy;  Surgeon: Nada Libman, MD;  Location: [facility] INVASIVE CV LAB;  Service: Cardiovascular;;  Left   . THYROID SURGERY      No Known Allergies  Allergies as of 04/09/2016   No Known Allergies     Medication List    Notice   This visit is during an admission. Changes to the med list made in this visit will be reflected in the After Visit Summary of the admission.     Review of Systems   There is no immunization history on file for this patient. Pertinent  Health Maintenance Due  Topic Date Due  . DEXA SCAN  02/09/1987  . INFLUENZA VACCINE  12/02/2016 (Originally 08/02/2016)  . PNA vac Low Risk Adult (1 of 2 - PCV13) 12/02/2016 (Originally 02/09/1987)   No flowsheet data found. Functional Status Survey:    There were no vitals filed for this visit. There is no height or weight on file to calculate BMI. Physical Exam  Labs  reviewed:  Recent Labs  02/08/16 0930 03/07/16 0600 04/10/16 0700  NA 138 139 141  K 3.8 3.8 4.0  CL 106 104 107  CO2 GLUCOSE 111* 83 77  BUN 31* 34* 36*  CREATININE 1.27* 1.24* 1.29*  CALCIUM 8.7* 8.8* 8.3*    Recent Labs  01/17/16 1718 03/07/16 0600  AST 22 13*  ALT <5* 10*  ALKPHOS 95 78  BILITOT 0.5 0.5  PROT 7.0 6.5  ALBUMIN 2.9* 2.9*    Recent Labs  02/08/16 0930 03/07/16 0700 04/10/16 0700  WBC 6.6 7.4 4.1  NEUTROABS 4.7 5.2 2.5  HGB 10.1* 10.3* 9.9*  HCT 31.3* 32.4* 31.7*  MCV 95.7 90.8 86.4  PLT 363 388 317   Lab Results  Component Value Date   TSH 2.487 03/07/2016   No results found for: HGBA1C Lab Results  Component Value Date   CHOL 105 03/07/2016   HDL 38 (L) 03/07/2016   LDLCALC 57 03/07/2016   TRIG 50 03/07/2016   CHOLHDL 2.8 03/07/2016    Significant Diagnostic Results in last 30 days:  No results found.  Assessment/Plan 1. Paroxysmal atrial fibrillation (HCC)     Family/ staff Communication:   Labs/tests ordered:

## 2016-04-11 NOTE — Addendum Note (Signed)
Addended by: Burton Apley A on: 04/11/2016 09:47 AM   Modules accepted: Orders

## 2016-05-11 ENCOUNTER — Non-Acute Institutional Stay (SKILLED_NURSING_FACILITY): Payer: Medicare Other | Admitting: Internal Medicine

## 2016-05-11 ENCOUNTER — Encounter: Payer: Self-pay | Admitting: Internal Medicine

## 2016-05-11 DIAGNOSIS — I480 Paroxysmal atrial fibrillation: Secondary | ICD-10-CM

## 2016-05-11 DIAGNOSIS — I10 Essential (primary) hypertension: Secondary | ICD-10-CM

## 2016-05-11 DIAGNOSIS — M79651 Pain in right thigh: Secondary | ICD-10-CM | POA: Diagnosis not present

## 2016-05-11 DIAGNOSIS — R600 Localized edema: Secondary | ICD-10-CM | POA: Diagnosis not present

## 2016-05-11 NOTE — Progress Notes (Signed)
Location:    [facility]ing Home Room Number: 111/W Place of Service:  SNF (31) Provider:   Assunta FoundGolding, John, MD  Patient Care Team: Assunta FoundGolding, John, MD as PCP - General (Family Medicine) [name], Dorothe PeaJonathan F, MD as Consulting Physician (Cardiology) Cliffton Astersampbell, John, MD as Consulting Physician (Infectious Diseases) [facility] (Sk[facility] Nursing Facility)  Extended Emergency Contact Information Primary Emergency Contact: [name],[name]  [place] Home Phone: [phone] Mobile Phone: [phone] Relation: None Secondary Emergency Contact: [name],April  [place] Home Phone: [phone] Relation: None  Code Status:   Goals of care: Advanced Directive information Advanced Directives 05/11/2016  Does Patient Have a Medical Advance Directive? Yes  Type of Advance Directive Out of facility DNR (pink MOST or yellow form)  Does patient want to make changes to medical advance directive? No - Patient declined  Copy of Healthcare Power of Attorney in Chart? -  Would patient like information on creating a medical advance directive? No - Patient declined     Chief Complaint  Patient presents with  . Acute Visit    Pain in right thigh    HPI:  Pt is a 83 y.o. female seen today for an acute visit for pain in her right thigh.  Patient has Patient has H/O CAD with Cath done in 2008 on Medical management, Paroxysmal Atrial Fibrillation Not on any Anticoagulation, Hypertension, Hyperlipidemia And PAD with Atherectomy of both extremities.with bilateral heel ulcers healing well.  Patient is c/o right thigh pain. Mostly around in front of her thigh. There is swelling in both LE. She denies any injury to that leg. She is still able to put pressure and weight on that leg. No redness. Denies any fever or chills.   Past Medical History:  Diagnosis Date  . Acid reflux   . Carotid artery occlusion    minimal  . Coronary artery disease   . Hypercholesteremia   .  Hypertension   . Myocardial infarct (HCC)   . PAF (paroxysmal atrial fibrillation) (HCC)   . Skin disorder   . Systolic murmur   . Thyroid disease    Past Surgical History:  Procedure Laterality Date  . ABDOMINAL HYSTERECTOMY    . AMPUTATION Right 01/05/2016   Procedure: RIGHT GREAT TOE AMPUTATION;  Surgeon: Nada LibmanVance W Brabham, MD;  [facility];  Service: Vascular;  Laterality: Right;  . CARDIAC CATHETERIZATION    . CARDIOVERSION  2012  . IR GENERIC HISTORICAL  12/30/2015   IR ANGIOGRAM EXTREMITY BILATERAL 12/30/2015 [facility]  . IR GENERIC HISTORICAL  12/30/2015   IR ANGIOGRAM SELECTIVE EACH ADDITIONAL VESSEL 12/30/2015 [facility]  . LOWER EXTREMITY ANGIOGRAM Right 12/30/2015   Procedure: RIGHT LOWER EXTREMITY ANGIOGRAM;  Surgeon: Ancil LinseyJason Evan Davis, MD;  Location: [facility] OR[facility];  Service: Vascular;  Laterality: Right;  accessed through left groin area - dressed with two 2x2 and 4x4 opsite  . PERIPHERAL VASCULAR CATHETERIZATION N/A 01/04/2016   Procedure: Lower Extremity Angiography;  Surgeon: Nada LibmanVance W Brabham, MD;  [facility];  Service: Cardiovascular;  Laterality: N/A;  . PERIPHERAL VASCULAR CATHETERIZATION Right 01/04/2016   Procedure: Peripheral Vascular Atherectomy;  Surgeon: Nada LibmanVance W Brabham, MD;  Location: [facility] INVASIVE CV LAB;  Service: Cardiovascular;  Laterality: Right;  Peroneal  . PERIPHERAL VASCULAR CATHETERIZATION N/A 02/01/2016   Procedure: Abdominal Aortogram w/Lower Extremity;  Surgeon: Nada LibmanVance W Brabham, MD;  Location: [facility] INVASIVE CV LAB;  Service: Cardiovascular;  Laterality: N/A;  . PERIPHERAL VASCULAR CATHETERIZATION  02/01/2016   Procedure: Peripheral  Vascular Atherectomy;  Surgeon: Nada Libman, MD;  Location: [facility] INVASIVE CV LAB;  Service: Cardiovascular;;  Left   . THYROID SURGERY      No Known Allergies  Allergies as of 05/11/2016   No Known Allergies     Medication List    Notice   This visit is during an admission. Changes to the med  list made in this visit will be reflected in the After Visit Summary of the admission.     Review of Systems  Constitutional: Negative.   HENT: Negative.   Respiratory: Negative.   Cardiovascular: Positive for leg swelling. Negative for chest pain and palpitations.  Gastrointestinal: Negative.   Genitourinary: Negative.   Musculoskeletal: Negative for back pain, joint swelling, myalgias and neck pain.     There is no immunization history on file for this patient. Pertinent  Health Maintenance Due  Topic Date Due  . DEXA SCAN  02/09/1987  . INFLUENZA VACCINE  12/02/2016 (Originally 08/02/2016)  . PNA vac Low Risk Adult (1 of 2 - PCV13) 12/02/2016 (Originally 02/09/1987)   No flowsheet data found. Functional Status Survey:    Vitals:   05/11/16 1216  BP: (!) 142/70  Pulse: 68  Resp: 18  Temp: 98.4 F (36.9 C)  TempSrc: Oral  SpO2: 96%   There is no height or weight on file to calculate BMI. Physical Exam  Constitutional: She appears well-developed and well-nourished.  HENT:  Head: Normocephalic.  Mouth/Throat: Oropharynx is clear and moist.  Eyes: Pupils are equal, round, and reactive to light.  Cardiovascular: Normal rate and normal heart sounds.  An irregular rhythm present.  Pulmonary/Chest: Effort normal and breath sounds normal. No respiratory distress. She has no wheezes. She has no rales. She exhibits no tenderness.  Abdominal: Soft. Bowel sounds are normal. She exhibits no distension. There is no tenderness. There is no rebound.  Musculoskeletal:  Bilateral edema She has tenderness in the muscle on the Right thigh. She was also c/o pain when I was flexing her hip joint. Moving the joint sideways did not cause any problems. Calf Muscles did not have any pain. Knee joint exam was normal. Left Leg exam was noramal with no pain.    Labs reviewed:  Recent Labs  02/08/16 0930 03/07/16 0600 04/10/16 0700  NA 138 139 141  K 3.8 3.8 4.0  CL 106 104 107  CO2 26  27 26   GLUCOSE 111* 83 77  BUN 31* 34* 36*  CREATININE 1.27* 1.24* 1.29*  CALCIUM 8.7* 8.8* 8.3*    Recent Labs  01/17/16 1718 03/07/16 0600  AST 22 13*  ALT <5* 10*  ALKPHOS 95 78  BILITOT 0.5 0.5  PROT 7.0 6.5  ALBUMIN 2.9* 2.9*    Recent Labs  02/08/16 0930 03/07/16 0700 04/10/16 0700  WBC 6.6 7.4 4.1  NEUTROABS 4.7 5.2 2.5  HGB 10.1* 10.3* 9.9*  HCT 31.3* 32.4* 31.7*  MCV 95.7 90.8 86.4  PLT 363 388 317   Lab Results  Component Value Date   TSH 2.487 03/07/2016   No results found for: HGBA1C Lab Results  Component Value Date   CHOL 105 03/07/2016   HDL 38 (L) 03/07/2016   LDLCALC 57 03/07/2016   TRIG 50 03/07/2016   CHOLHDL 2.8 03/07/2016    Significant Diagnostic Results in last 30 days:  No results found.  Assessment/Plan Right Thigh Pain Possible muscle spasm But due to some hip discomfort will Xray the leg and the hip Will also start  her on robaxin PRN. If pain not better will consider doppler. Also do CMP and Serum magnesium Paroxysmal atrial fibrillation  Rate controlled Never been on Anticoagulation continue aspirin and amiodarone Bilateral leg edema Weight has gone up by 6 lbs Has been on lasix and weight is down by 2 lbs to 125 lb Repeat labs.   Family/ staff Communication:   Labs/tests ordered:  CMP, CBC And Serum Magnesium Xray of right thigh and Hip

## 2016-05-12 ENCOUNTER — Encounter (HOSPITAL_COMMUNITY)
Admission: RE | Admit: 2016-05-12 | Discharge: 2016-05-12 | Disposition: A | Discharge status: Home / Self Care | Payer: Medicare Other | Source: Skilled Nursing Facility | Attending: Internal Medicine | Admitting: Internal Medicine

## 2016-05-12 DIAGNOSIS — I739 Peripheral vascular disease, unspecified: Secondary | ICD-10-CM | POA: Insufficient documentation

## 2016-05-12 DIAGNOSIS — Z48812 Encounter for surgical aftercare following surgery on the circulatory system: Secondary | ICD-10-CM | POA: Diagnosis present

## 2016-05-12 DIAGNOSIS — I5022 Chronic systolic (congestive) heart failure: Secondary | ICD-10-CM | POA: Diagnosis present

## 2016-05-12 DIAGNOSIS — K219 Gastro-esophageal reflux disease without esophagitis: Secondary | ICD-10-CM | POA: Diagnosis present

## 2016-05-12 DIAGNOSIS — E785 Hyperlipidemia, unspecified: Secondary | ICD-10-CM | POA: Insufficient documentation

## 2016-05-12 LAB — CBC WITH DIFFERENTIAL/PLATELET
BASOS PCT: 0 %
Basophils Absolute: 0 10*3/uL (ref 0.0–0.1)
EOS ABS: 0.2 10*3/uL (ref 0.0–0.7)
EOS PCT: 2 %
HCT: 36.8 % (ref 36.0–46.0)
HEMOGLOBIN: 11.6 g/dL — AB (ref 12.0–15.0)
Lymphocytes Relative: 19 %
Lymphs Abs: 1.4 10*3/uL (ref 0.7–4.0)
MCH: 26.1 pg (ref 26.0–34.0)
MCHC: 31.5 g/dL (ref 30.0–36.0)
MCV: 82.9 fL (ref 78.0–100.0)
MONOS PCT: 12 %
Monocytes Absolute: 0.9 10*3/uL (ref 0.1–1.0)
NEUTROS PCT: 67 %
Neutro Abs: 5 10*3/uL (ref 1.7–7.7)
PLATELETS: 327 10*3/uL (ref 150–400)
RBC: 4.44 MIL/uL (ref 3.87–5.11)
RDW: 15.9 % — ABNORMAL HIGH (ref 11.5–15.5)
WBC: 7.4 10*3/uL (ref 4.0–10.5)

## 2016-05-12 LAB — COMPREHENSIVE METABOLIC PANEL
ALT: 15 U/L (ref 14–54)
AST: 19 U/L (ref 15–41)
Albumin: 3.6 g/dL (ref 3.5–5.0)
Alkaline Phosphatase: 97 U/L (ref 38–126)
Anion gap: 8 (ref 5–15)
BUN: 33 mg/dL — ABNORMAL HIGH (ref 6–20)
CO2: 29 mmol/L (ref 22–32)
CREATININE: 1.3 mg/dL — AB (ref 0.44–1.00)
Calcium: 8.9 mg/dL (ref 8.9–10.3)
Chloride: 103 mmol/L (ref 101–111)
GFR, EST AFRICAN AMERICAN: 39 mL/min — AB (ref >60)
GFR, EST NON AFRICAN AMERICAN: 34 mL/min — AB (ref >60)
Glucose, Bld: 90 mg/dL (ref 65–99)
POTASSIUM: 3.9 mmol/L (ref 3.5–5.1)
SODIUM: 140 mmol/L (ref 135–145)
Total Bilirubin: 0.3 mg/dL (ref 0.3–1.2)
Total Protein: 7.3 g/dL (ref 6.5–8.1)

## 2016-05-12 LAB — MAGNESIUM: MAGNESIUM: 2.1 mg/dL (ref 1.7–2.4)

## 2016-05-17 ENCOUNTER — Encounter: Payer: Self-pay | Admitting: Internal Medicine

## 2016-05-17 NOTE — Progress Notes (Signed)
Location:   [facility] Room Number: 111/W Place of Service:  SNF [phone]) Provider:  Andrew Au, MD  Patient Care Team: Assunta Found, MD as PCP - General (Family Medicine) Wyline Mood [name], MD as Consulting Physician (Cardiology) Cliffton Asters, MD as Consulting Physician (Infectious Diseases) [facility])  Extended Emergency Contact Information Primary Emergency Contact: Casimer Lanius [place] Home Phone: [phone] Mobile Phone: [phone] Relation: None Secondary Emergency Contact: Margarito Courser [place] Home Phone: [phone] Relation: None  Code Status:  DNR Goals of care: Advanced Directive information Advanced Directives 05/17/2016  Does Patient Have a Medical Advance Directive? Yes  Type of Advance Directive Out of facility DNR (pink MOST or yellow form)  Does patient want to make changes to medical advance directive? No - Patient declined  Copy of Healthcare Power of Attorney in Chart? -  Would patient like information on creating a medical advance directive? No - Patient declined     Chief Complaint  Patient presents with  . Medical Management of Chronic Issues    Routine Visit    HPI:  Pt is a 83 y.o. female seen today for medical management of chronic diseases.     Past Medical History:  Diagnosis Date  . Acid reflux   . Carotid artery occlusion    minimal  . Coronary artery disease   . Hypercholesteremia   . Hypertension   . Myocardial infarct (HCC)   . PAF (paroxysmal atrial fibrillation) (HCC)   . Skin disorder   . Systolic murmur   . Thyroid disease    Past Surgical History:  Procedure Laterality Date  . ABDOMINAL HYSTERECTOMY    . AMPUTATION Right 01/05/2016   Procedure: RIGHT GREAT TOE AMPUTATION;  Surgeon: Nada Libman, MD;  Location: [facility] OR;  Service: Vascular;  Laterality: Right;  . CARDIAC CATHETERIZATION    .  CARDIOVERSION  2012  . IR GENERIC HISTORICAL  12/30/2015   IR ANGIOGRAM EXTREMITY BILATERAL 12/30/2015 [facility]  . IR GENERIC HISTORICAL  12/30/2015   IR ANGIOGRAM SELECTIVE EACH ADDITIONAL VESSEL 12/30/2015 [facility]  . LOWER EXTREMITY ANGIOGRAM Right 12/30/2015   Procedure: RIGHT LOWER EXTREMITY ANGIOGRAM;  Surgeon: Ancil Linsey, MD;  Location: [facility];  Service: Vascular;  Laterality: Right;  accessed through left groin area - dressed with two 2x2 and 4x4 opsite  . PERIPHERAL VASCULAR CATHETERIZATION N/A 01/04/2016   Procedure: Lower Extremity Angiography;  Surgeon: Nada Libman, MD;  Location: [facility] INVASIVE CV LAB;  Service: Cardiovascular;  Laterality: N/A;  . PERIPHERAL VASCULAR CATHETERIZATION Right 01/04/2016   Procedure: Peripheral Vascular Atherectomy;  Surgeon: Nada Libman, MD;  Location: [facility] INVASIVE CV LAB;  Service: Cardiovascular;  Laterality: Right;  Peroneal  . PERIPHERAL VASCULAR CATHETERIZATION N/A 02/01/2016   Procedure: Abdominal Aortogram w/Lower Extremity;  Surgeon: Nada Libman, MD;  Location: [facility] INVASIVE CV LAB;  Service: Cardiovascular;  Laterality: N/A;  . PERIPHERAL VASCULAR CATHETERIZATION  02/01/2016   Procedure: Peripheral Vascular Atherectomy;  Surgeon: Nada Libman, MD;  Location: [facility] INVASIVE CV LAB;  Service: Cardiovascular;;  Left   . THYROID SURGERY      No Known Allergies  Outpatient Encounter Prescriptions as of 05/17/2016  Medication Sig  . acetaminophen (TYLENOL) 325 MG tablet Take 650 mg by mouth every 4 (four) hours as needed.   Marland Kitchen amiodarone (PACERONE) 200 MG tablet Take 0.5 tablets (100 mg total) by mouth daily.  Marland Kitchen amLODipine (  NORVASC) 10 MG tablet Take 10 mg by mouth daily.  Marland Kitchen. aspirin EC 81 MG tablet Take 1 tablet (81 mg total) by mouth daily.  Marland Kitchen. atorvastatin (LIPITOR) 20 MG tablet Take 20 mg by mouth every other day. Take 1 tablet (20 mg) by mouth every other night  . collagenase (SANTYL) ointment Apply 1 application  topically daily.  Marland Kitchen. docusate sodium (COLACE) 100 MG capsule Take 100 mg by mouth daily.  . ferrous sulfate (QC FERROUS SULFATE) 325 (65 FE) MG tablet Take 325 mg by mouth daily with breakfast.  . furosemide (LASIX) 20 MG tablet Give 1 tablet by mouth daily and prn as needed  . levothyroxine (SYNTHROID, LEVOTHROID) 75 MCG tablet Take 75 mcg by mouth daily.    . magnesium hydroxide (MILK OF MAGNESIA) 400 MG/5ML suspension Take 30 mLs by mouth daily as needed for mild constipation.  . methocarbamol (ROBAXIN) 500 MG tablet Take 500 mg by mouth 3 (three) times daily as needed for muscle spasms.  Marland Kitchen. omeprazole (PRILOSEC) 40 MG capsule Take 40 mg by mouth at bedtime.  . senna (SENOKOT) 8.6 MG TABS tablet Take 2 tablets by mouth daily.   . vitamin B-12 (CYANOCOBALAMIN) 1000 MCG tablet Take 1,000 mcg by mouth daily.   No facility-administered encounter medications on file as of 05/17/2016.      Review of Systems   There is no immunization history on file for this patient. Pertinent  Health Maintenance Due  Topic Date Due  . DEXA SCAN  02/09/1987  . INFLUENZA VACCINE  12/02/2016 (Originally 08/02/2016)  . PNA vac Low Risk Adult (1 of 2 - PCV13) 12/02/2016 (Originally 02/09/1987)   No flowsheet data found. Functional Status Survey:    There were no vitals filed for this visit. There is no height or weight on file to calculate BMI. Physical Exam  Labs reviewed:  Recent Labs  03/07/16 0600 04/10/16 0700 05/12/16 0700  NA 139 141 140  K 3.8 4.0 3.9  CL 104 107 103  CO2 27 26 29   GLUCOSE 83 77 90  BUN 34* 36* 33*  CREATININE 1.24* 1.29* 1.30*  CALCIUM 8.8* 8.3* 8.9  MG  --   --  2.1    Recent Labs  01/17/16 1718 03/07/16 0600 05/12/16 0700  AST 22 13* 19  ALT <5* 10* 15  ALKPHOS 95 78 97  BILITOT 0.5 0.5 0.3  PROT 7.0 6.5 7.3  ALBUMIN 2.9* 2.9* 3.6    Recent Labs  03/07/16 0700 04/10/16 0700 05/12/16 0700  WBC 7.4 4.1 7.4  NEUTROABS 5.2 2.5 5.0  HGB 10.3* 9.9* 11.6*    HCT 32.4* 31.7* 36.8  MCV 90.8 86.4 82.9  PLT 388 317 327   Lab Results  Component Value Date   TSH 2.487 03/07/2016   No results found for: HGBA1C Lab Results  Component Value Date   CHOL 105 03/07/2016   HDL 38 (L) 03/07/2016   LDLCALC 57 03/07/2016   TRIG 50 03/07/2016   CHOLHDL 2.8 03/07/2016    Significant Diagnostic Results in last 30 days:  No results found.  Assessment/Plan There are no diagnoses linked to this encounter.   Family/ staff Communication:   Labs/tests ordered:

## 2016-05-19 ENCOUNTER — Encounter: Payer: Self-pay | Admitting: Family

## 2016-05-21 NOTE — Progress Notes (Signed)
This encounter was created in error - please disregard.

## 2016-05-22 ENCOUNTER — Non-Acute Institutional Stay (SKILLED_NURSING_FACILITY): Payer: Medicare Other | Admitting: Internal Medicine

## 2016-05-22 ENCOUNTER — Encounter: Payer: Self-pay | Admitting: Internal Medicine

## 2016-05-22 ENCOUNTER — Encounter (HOSPITAL_COMMUNITY)
Admission: RE | Admit: 2016-05-22 | Discharge: 2016-05-22 | Disposition: A | Discharge status: Home / Self Care | Payer: Medicare Other | Source: Skilled Nursing Facility | Attending: Internal Medicine | Admitting: Internal Medicine

## 2016-05-22 DIAGNOSIS — I480 Paroxysmal atrial fibrillation: Secondary | ICD-10-CM

## 2016-05-22 DIAGNOSIS — N183 Chronic kidney disease, stage 3 unspecified: Secondary | ICD-10-CM

## 2016-05-22 DIAGNOSIS — I779 Disorder of arteries and arterioles, unspecified: Secondary | ICD-10-CM | POA: Diagnosis not present

## 2016-05-22 DIAGNOSIS — R600 Localized edema: Secondary | ICD-10-CM

## 2016-05-22 DIAGNOSIS — E039 Hypothyroidism, unspecified: Secondary | ICD-10-CM

## 2016-05-22 DIAGNOSIS — I2510 Atherosclerotic heart disease of native coronary artery without angina pectoris: Secondary | ICD-10-CM

## 2016-05-22 DIAGNOSIS — I739 Peripheral vascular disease, unspecified: Secondary | ICD-10-CM | POA: Insufficient documentation

## 2016-05-22 DIAGNOSIS — I5022 Chronic systolic (congestive) heart failure: Secondary | ICD-10-CM | POA: Insufficient documentation

## 2016-05-22 DIAGNOSIS — Z48812 Encounter for surgical aftercare following surgery on the circulatory system: Secondary | ICD-10-CM | POA: Insufficient documentation

## 2016-05-22 DIAGNOSIS — K219 Gastro-esophageal reflux disease without esophagitis: Secondary | ICD-10-CM | POA: Insufficient documentation

## 2016-05-22 NOTE — Progress Notes (Signed)
Location:   [facility] Room Number: 111/W Place of Service:  SNF [phone]) Provider:  Andrew Au, MD  Patient Care Team: Assunta Found, MD as PCP - General (Family Medicine) Wyline Mood [name], MD as Consulting Physician (Cardiology) Cliffton Asters, MD as Consulting Physician (Infectious Diseases) [facility])  Extended Emergency Contact Information Primary Emergency Contact: Casimer Lanius [place] Home Phone: [phone] Mobile Phone: [phone] Relation: None Secondary Emergency Contact: Margarito Courser [place] Home Phone: [phone] Relation: None  Code Status:  DNR Goals of care: Advanced Directive information Advanced Directives 05/22/2016  Does Patient Have a Medical Advance Directive? Yes  Type of Advance Directive Out of facility DNR (pink MOST or yellow form)  Does patient want to make changes to medical advance directive? No - Patient declined  Copy of Healthcare Power of Attorney in Chart? -  Would patient like information on creating a medical advance directive? No - Patient declined     Chief Complaint  Patient presents with  . Medical Management of Chronic Issues    Routine Visit   For medical management of chronic medical conditions including atrial fibrillation-peripheral arterial disease-coronary artery disease-hypertension-edema-hypothyroidism-anemia-hyperlipidemia- HPI:  Pt is a 83 y.o. female seen today for medical management of chronic diseases. As noted above.  She iss/p atherectomy right peroneal artery, angioplasty of the right peroneal artery and tibioperoneal trunk on 01/04/16 and then underwent right great toe amputation on 01/05/16 by Dr. Myra Gianotti  He continues with bilateral heel wounds which apparently are doing significantly better without sign of infection and she is followed by vascular and bathing specialists.  She is currently  receiving Santyl.  She was also seen recently by Dr. Chales Abrahams for right thigh pain but this appears to have resolved she did have when necessary Robaxin at the time which apparently gave relief.  She also is had increased lower extremity edema and weight gain and her Lasix has been made routine this appears to be having a beneficial effect her weight is trending down as well as edema-she does have a history of grade 2 diastolic CHF.  She is not complaining of any shortness of breath chest plain in fact does not have any complaints today she appears to be doing quite well considering her numerous comorbidities.  She does have a history of atrial fibrillation she is on aspirin for anticoagulation and amiodarone for rate control.  Also a history of hypertension which from what I can tell appears to be well controlled on Norvasc pressure today is 138/52.  Currently she is sitting in her wheelchair comfortably vital signs as noted appear to be stable      Past Medical History:  Diagnosis Date  . Acid reflux   . Carotid artery occlusion    minimal  . Coronary artery disease   . Hypercholesteremia   . Hypertension   . Myocardial infarct (HCC)   . PAF (paroxysmal atrial fibrillation) (HCC)   . Skin disorder   . Systolic murmur   . Thyroid disease    Past Surgical History:  Procedure Laterality Date  . ABDOMINAL HYSTERECTOMY    . AMPUTATION Right 01/05/2016   Procedure: RIGHT GREAT TOE AMPUTATION;  Surgeon: Nada Libman, MD;  Location: [facility] OR;  Service: Vascular;  Laterality: Right;  . CARDIAC CATHETERIZATION    . CARDIOVERSION  2012  . IR GENERIC HISTORICAL  12/30/2015   IR ANGIOGRAM EXTREMITY BILATERAL 12/30/2015 [facility]  .  IR GENERIC HISTORICAL  12/30/2015   IR ANGIOGRAM SELECTIVE EACH ADDITIONAL VESSEL 12/30/2015 [facility]  . LOWER EXTREMITY ANGIOGRAM Right 12/30/2015   Procedure: RIGHT LOWER EXTREMITY ANGIOGRAM;  Surgeon: Ancil Linsey, MD;  Location: [facility] OR[facility];   Service: Vascular;  Laterality: Right;  accessed through left groin area - dressed with two 2x2 and 4x4 opsite  . PERIPHERAL VASCULAR CATHETERIZATION N/A 01/04/2016   Procedure: Lower Extremity Angiography;  Surgeon: Nada Libman, MD;  Location: [facility] INVASIVE CV LAB;  Service: Cardiovascular;  Laterality: N/A;  . PERIPHERAL VASCULAR CATHETERIZATION Right 01/04/2016   Procedure: Peripheral Vascular Atherectomy;  Surgeon: Nada Libman, MD;  Location: [facility] INVASIVE CV LAB;  Service: Cardiovascular;  Laterality: Right;  Peroneal  . PERIPHERAL VASCULAR CATHETERIZATION N/A 02/01/2016   Procedure: Abdominal Aortogram w/Lower Extremity;  Surgeon: Nada Libman, MD;  Location: [facility] INVASIVE CV LAB;  Service: Cardiovascular;  Laterality: N/A;  . PERIPHERAL VASCULAR CATHETERIZATION  02/01/2016   Procedure: Peripheral Vascular Atherectomy;  Surgeon: Nada Libman, MD;  Location: [facility] INVASIVE CV LAB;  Service: Cardiovascular;;  Left   . THYROID SURGERY      No Known Allergies  Outpatient Encounter Prescriptions as of 05/22/2016  Medication Sig  . acetaminophen (TYLENOL) 325 MG tablet Take 650 mg by mouth every 4 (four) hours as needed.   Marland Kitchen amiodarone (PACERONE) 200 MG tablet Take 0.5 tablets (100 mg total) by mouth daily.  Marland Kitchen amLODipine (NORVASC) 10 MG tablet Take 10 mg by mouth daily.  Marland Kitchen aspirin EC 81 MG tablet Take 1 tablet (81 mg total) by mouth daily.  Marland Kitchen atorvastatin (LIPITOR) 20 MG tablet Take 20 mg by mouth every other day. Take 1 tablet (20 mg) by mouth every other night  . collagenase (SANTYL) ointment Apply 1 application topically daily.  Marland Kitchen docusate sodium (COLACE) 100 MG capsule Take 100 mg by mouth daily.  . ferrous sulfate (QC FERROUS SULFATE) 325 (65 FE) MG tablet Take 325 mg by mouth daily with breakfast.  . furosemide (LASIX) 20 MG tablet Give 1 tablet by mouth daily and prn as needed  . levothyroxine (SYNTHROID, LEVOTHROID) 75 MCG tablet Take 75 mcg by mouth daily.    . magnesium hydroxide  (MILK OF MAGNESIA) 400 MG/5ML suspension Take 30 mLs by mouth daily as needed for mild constipation.  Marland Kitchen omeprazole (PRILOSEC) 40 MG capsule Take 40 mg by mouth at bedtime.  . senna (SENOKOT) 8.6 MG TABS tablet Take 2 tablets by mouth daily.   . vitamin B-12 (CYANOCOBALAMIN) 1000 MCG tablet Take 1,000 mcg by mouth daily.  . [DISCONTINUED] methocarbamol (ROBAXIN) 500 MG tablet Take 500 mg by mouth 3 (three) times daily as needed for muscle spasms.   No facility-administered encounter medications on file as of 05/22/2016.      Review of Systems   General does not complaining fever or chills .  Skin  history of lower extremity wounds as noted above the heel ulcers which are improving  Head ears eyes nose mouth and throat does not complaining of any visual changes sore throat.  Respiratory denies shortness of breath or cough.  Cardiac is not complaining of any chest pain has had some lower extremity edema but this appears to have improved  GI is not complaining of any nausea vomiting or abdominal pain.  Musculoskeletal she has gained strength is not complaining of joint pain   Neurologic is not complaining of dizziness or headache  Psych is not complaining of anxiety or depression appears to  be at baseline               There is no immunization history on file for this patient. Pertinent  Health Maintenance Due  Topic Date Due  . DEXA SCAN  02/09/1987  . INFLUENZA VACCINE  12/02/2016 (Originally 08/02/2016)  . PNA vac Low Risk Adult (1 of 2 - PCV13) 12/02/2016 (Originally 02/09/1987)   No flowsheet data found. Functional Status Survey:    Temperature 98.7 pulse 58 respirations 16 blood pressure 138/52-weight is 120.8 this appears to be trending down baseline weight appears to be around 117  Physical Exam   Gen. this is a pleasant elderly female in no distress sitting comfortably in her wheelchair   HENT:  Head: Normocephalic.  Mouth/Throat: Oropharynx is  clear and moist.  Eyes: Sclera and conjunctiva are clear visual acuity appears grossly intact  she has prescription lenses  Cardiovascular: Regular irregular rate and rhythm with a pulse of 58-she has trace lower extremity edema.  Pulmonary/Chest: Effort normaland breath sounds normal. No respiratory distress. She has no wheezes. She has no rales.  Abdominal: Soft. Bowel sounds are normal. She exhibits no distension. There is no tenderness. There is no rebound.  Musculoskeletal:  Is able to move all extremities 4 with baseline strength it appears--she has right great toe amputation  Neurological: She is grossly alert and oriented pleasant and appropriate--continues to be conversational  Moving all extremities well Cranial nerves appear to be intact no lateralizing findings Skin: Skin is warmand dry. No rashnoted She has bilateral heel wounds there is an erythematous wound bed with granulation tissue there is no surrounding erythema drainage or odor-at this point no sign of infection these are improving per wound care ( She does have wounds on her lower extremities which have been followed by wound care  She is status post right great toe amputation Heels bilaterally in proximal feet are currently wrapped again per nursing her wounds here appear to be healing well without sign of infection       Labs reviewed:  Recent Labs  03/07/16 0600 04/10/16 0700 05/12/16 0700  NA 139 141 140  K 3.8 4.0 3.9  CL 104 107 103  CO2 27 26 29   GLUCOSE 83 77 90  BUN 34* 36* 33*  CREATININE 1.24* 1.29* 1.30*  CALCIUM 8.8* 8.3* 8.9  MG  --   --  2.1    Recent Labs  01/17/16 1718 03/07/16 0600 05/12/16 0700  AST 22 13* 19  ALT <5* 10* 15  ALKPHOS 95 78 97  BILITOT 0.5 0.5 0.3  PROT 7.0 6.5 7.3  ALBUMIN 2.9* 2.9* 3.6    Recent Labs  03/07/16 0700 04/10/16 0700 05/12/16 0700  WBC 7.4 4.1 7.4  NEUTROABS 5.2 2.5 5.0  HGB 10.3* 9.9* 11.6*  HCT 32.4* 31.7* 36.8  MCV  90.8 86.4 82.9  PLT 388 317 327   Lab Results  Component Value Date   TSH 2.487 03/07/2016   No results found for: HGBA1C Lab Results  Component Value Date   CHOL 105 03/07/2016   HDL 38 (L) 03/07/2016   LDLCALC 57 03/07/2016   TRIG 50 03/07/2016   CHOLHDL 2.8 03/07/2016    Significant Diagnostic Results in last 30 days:  No results found.  Assessment/Plan  #1 history of bilateral heel wounds again this is followed by wound care as well as vascular these appear to be improving without sign of infection will need close monitoring and continued wound care.  #2  atrial fibrillation this appears rate controlled on a amiodarone she is on aspirin for anticoagulation.  #3 peripheral arterial disease as noted above she does have bilateral heel wounds which appear to be healing is status post right great toe amputation-she is followed closely by vascularspecialist.  #4 history of hypertension this appears stable on Norvasc 10 mg a day.  #5 history of coronary artery disease she is followed by cardiology continues on aspirin and a statin-she is on atorvastatin LDL was 57 on lab done back in March 2018.  #6 history of diastolic CHF with edema this has improved with the routine Lasix will update a metabolic panel to ensure stability of renal function and electrolytes her weight appears to be trending down as well as surgery edema.  #7 history of hypothyroidism she is on Synthroid TSH was 2.487 on lab done in March 2018.  #8 history of anemia she is on iron this appears stable with a hemoglobin of 11.6 on lab done earlier this month this actually shows improvement.  #9 history of B12 deficiency continues on supplementation Will update a B12 level.  #10 history of chronic kidney disease creatinine of 1.3 on lab done earlier this month appears relatively stable we will need to monitor since she is now on routine Lasix will update a basic metabolic panel.  #11 history constipation apparently  this is stabilized on Colace and senna   [id number]-of note greater than 35 minutes spent assessing patient-reviewing her chart her labs discussing her status with nursing staff-and coordinating and formulating a plan of care for numerous diagnoses-of note greater than 50% of time spent coordinating plan of care

## 2016-05-23 DIAGNOSIS — I739 Peripheral vascular disease, unspecified: Secondary | ICD-10-CM | POA: Diagnosis present

## 2016-05-23 DIAGNOSIS — Z48812 Encounter for surgical aftercare following surgery on the circulatory system: Secondary | ICD-10-CM | POA: Diagnosis not present

## 2016-05-23 DIAGNOSIS — I5022 Chronic systolic (congestive) heart failure: Secondary | ICD-10-CM | POA: Diagnosis present

## 2016-05-23 DIAGNOSIS — K219 Gastro-esophageal reflux disease without esophagitis: Secondary | ICD-10-CM | POA: Diagnosis present

## 2016-05-23 LAB — BASIC METABOLIC PANEL
Anion gap: 9 (ref 5–15)
BUN: 38 mg/dL — ABNORMAL HIGH (ref 6–20)
CALCIUM: 9 mg/dL (ref 8.9–10.3)
CO2: 29 mmol/L (ref 22–32)
CREATININE: 1.29 mg/dL — AB (ref 0.44–1.00)
Chloride: 104 mmol/L (ref 101–111)
GFR calc Af Amer: 40 mL/min — ABNORMAL LOW (ref >60)
GFR calc non Af Amer: 34 mL/min — ABNORMAL LOW (ref >60)
Glucose, Bld: 81 mg/dL (ref 65–99)
Potassium: 4.1 mmol/L (ref 3.5–5.1)
SODIUM: 142 mmol/L (ref 135–145)

## 2016-05-23 LAB — VITAMIN B12: VITAMIN B 12: 2512 pg/mL — AB (ref 180–914)

## 2016-05-25 ENCOUNTER — Ambulatory Visit (INDEPENDENT_AMBULATORY_CARE_PROVIDER_SITE_OTHER): Payer: Medicare Other | Admitting: Family

## 2016-05-25 ENCOUNTER — Encounter: Payer: Self-pay | Admitting: Family

## 2016-05-25 ENCOUNTER — Ambulatory Visit (HOSPITAL_COMMUNITY)
Admit: 2016-05-25 | Discharge: 2016-05-25 | Disposition: A | Discharge status: Home / Self Care | Payer: Medicare Other | Attending: Family | Admitting: Family

## 2016-05-25 VITALS — BP 152/64 | HR 56 | Temp 97.6°F | Resp 18 | Ht 65.0 in

## 2016-05-25 DIAGNOSIS — Z9862 Peripheral vascular angioplasty status: Secondary | ICD-10-CM | POA: Insufficient documentation

## 2016-05-25 DIAGNOSIS — I779 Disorder of arteries and arterioles, unspecified: Secondary | ICD-10-CM | POA: Diagnosis present

## 2016-05-25 DIAGNOSIS — L97402 Non-pressure chronic ulcer of unspecified heel and midfoot with fat layer exposed: Secondary | ICD-10-CM | POA: Diagnosis not present

## 2016-05-25 DIAGNOSIS — Z89411 Acquired absence of right great toe: Secondary | ICD-10-CM | POA: Diagnosis not present

## 2016-05-25 DIAGNOSIS — I83004 Varicose veins of unspecified lower extremity with ulcer of heel and midfoot: Secondary | ICD-10-CM | POA: Diagnosis not present

## 2016-05-25 NOTE — Patient Instructions (Addendum)
Peripheral Vascular Disease Peripheral vascular disease (PVD) is a disease of the blood vessels that are not part of your heart and brain. A simple term for PVD is poor circulation. In most cases, PVD narrows the blood vessels that carry blood from your heart to the rest of your body. This can result in a decreased supply of blood to your arms, legs, and internal organs, like your stomach or kidneys. However, it most often affects a person's lower legs and feet. There are two types of PVD.  Organic PVD. This is the more common type. It is caused by damage to the structure of blood vessels.  Functional PVD. This is caused by conditions that make blood vessels contract and tighten (spasm). Without treatment, PVD tends to get worse over time. PVD can also lead to acute ischemic limb. This is when an arm or limb suddenly has trouble getting enough blood. This is a medical emergency. Follow these instructions at home:  Take medicines only as told by your doctor.  Do not use any tobacco products, including cigarettes, chewing tobacco, or electronic cigarettes. If you need help quitting, ask your doctor.  Lose weight if you are overweight, and maintain a healthy weight as told by your doctor.  Eat a diet that is low in fat and cholesterol. If you need help, ask your doctor.  Exercise regularly. Ask your doctor for some good activities for you.  Take good care of your feet.  Wear comfortable shoes that fit well.  Check your feet often for any cuts or sores. Contact a doctor if:  You have cramps in your legs while walking.  You have leg pain when you are at rest.  You have coldness in a leg or foot.  Your skin changes.  You are unable to get or have an erection (erectile dysfunction).  You have cuts or sores on your feet that are not healing. Get help right away if:  Your arm or leg turns cold and blue.  Your arms or legs become red, warm, swollen, painful, or numb.  You have  chest pain or trouble breathing.  You suddenly have weakness in your face, arm, or leg.  You become very confused or you cannot speak.  You suddenly have a very bad headache.  You suddenly cannot see. This information is not intended to replace advice given to you by your health care provider. Make sure you discuss any questions you have with your health care provider. Document Released: 03/15/2009 Document Revised: 05/27/2015 Document Reviewed: 05/29/2013 Elsevier Interactive Patient Education  2017 [employer]    Venous Ulcer A venous ulcer is a shallow sore on your lower leg. It is caused by poor circulation in your veins. Venous ulcer is the most common type of lower leg ulcer. You may have venous ulcers on one leg or on both legs. This condition most often develops around your ankles. This type of ulcer may last for a long time (chronic ulcer) or it may return often (recurrent ulcer). Follow these instructions at home: Wound care   Follow instructions from your doctor about:  How to take care of your wound.  When and how you should change your bandage (dressing).  When you should remove your bandage. If your bandage is dry and gets stuck to your leg when you try to remove it, moisten or wet the bandage with saline solution or water. This helps you to remove it without harming your skin or wound.  Check your wound  every day for signs of infection. Have a caregiver do this for you if you are not able to do it yourself. Watch for:  More redness, swelling, or pain.  More fluid or blood.  Pus, warmth, or a bad smell. Medicines   Take over-the-counter and prescription medicines only as told by your doctor.  If you were prescribed an antibiotic medicine, take it or apply it as told by your doctor. Do not stop taking or using the antibiotic even if your condition improves. Activity   Do not stand or sit in one position for a long period of time. Rest with your legs raised  during the day. If possible, keep your legs above your heart for 30 minutes, 3-4 times a day, or as told by your doctor.  Do not sit with your legs crossed.  Walk often to increase the blood flow in your legs. Ask your doctor what level of activity is safe for you.  If you are taking a long ride in a car or plane, take a break to walk around at least once every two hours, or as told by your doctor. Ask your doctor if you should take aspirin before long trips. General instructions    Wear elastic stockings, compression stockings, or support hose as told by your doctor. This is very important.  Raise the foot of your bed as told by your doctor.  Do not smoke.  Keep all follow-up visits as told by your doctor. This is important. Contact a doctor if:  You have a fever.  Your ulcer is getting larger or is not healing.  Your pain gets worse.  You have more redness or swelling around your ulcer.  You have more fluid, blood, or pus coming from your ulcer after it has been cleaned by you or your doctor.  You have warmth or a bad smell coming from your ulcer. This information is not intended to replace advice given to you by your health care provider. Make sure you discuss any questions you have with your health care provider. Document Released: 01/27/2004 Document Revised: 05/27/2015 Document Reviewed: 04/29/2014 Elsevier Interactive Patient Education  2017 [employer].

## 2016-05-25 NOTE — Progress Notes (Signed)
[facility] [place]   CC: Follow up peripheral artery occlusive disease  History of Present Illness [name] is a 83 y.o. female who is s/p atherectomy right peroneal artery, angioplasty of the right peroneal artery and tibioperoneal trunk on 01/04/16 and then underwent right great toe amputation on 01/05/16 by Dr. Myra Gianotti.  I last evaluated pt on 04-10-16. At that time her bilateral heel and lateral right foot ulcers were improving with less fibrinous exudate at the base of the ulcers. She returns today for 4 weeks wound evaluation.   Pt states her feet ulcers are improving with daily Santyl and regular gauze dressing changes.   She is residing at [facility].She states that she is walking with a walker minimally as she needs to keep pressure off of her heels and feet, therefore cannot wear shoes, but over alldoing well. Her son states that she is not doing daily seated leg exercises as advised at her last visit.  Pt denies fever or chills, denies pain.   Pt Diabetic: No Pt smoker: non-smoker  Pt meds include: Statin :Yes Betablocker: No ASA: Yes Other anticoagulants/antiplatelets: no     Past Medical History:  Diagnosis Date  . Acid reflux   . Carotid artery occlusion    minimal  . Coronary artery disease   . Hypercholesteremia   . Hypertension   . Myocardial infarct (HCC)   . PAF (paroxysmal atrial fibrillation) (HCC)   . Skin disorder   . Systolic murmur   . Thyroid disease     Social History Social History  Substance Use Topics  . Smoking status: Never Smoker  . Smokeless tobacco: Never Used  . Alcohol use No    Family History Family History  Problem Relation Age of Onset  . Family history unknown: Yes    Past Surgical History:  Procedure Laterality Date  . ABDOMINAL HYSTERECTOMY    . AMPUTATION Right 01/05/2016   Procedure: RIGHT GREAT TOE AMPUTATION;  Surgeon: Nada Libman, MD;  Location: [facility] OR;  Service:  Vascular;  Laterality: Right;  . CARDIAC CATHETERIZATION    . CARDIOVERSION  2012  . IR GENERIC HISTORICAL  12/30/2015   IR ANGIOGRAM EXTREMITY BILATERAL 12/30/2015 [facility]  . IR GENERIC HISTORICAL  12/30/2015   IR ANGIOGRAM SELECTIVE EACH ADDITIONAL VESSEL 12/30/2015 [facility]  . LOWER EXTREMITY ANGIOGRAM Right 12/30/2015   Procedure: RIGHT LOWER EXTREMITY ANGIOGRAM;  Surgeon: Ancil Linsey, MD;  Location: [facility];  Service: Vascular;  Laterality: Right;  accessed through left groin area - dressed with two 2x2 and 4x4 opsite  . PERIPHERAL VASCULAR CATHETERIZATION N/A 01/04/2016   Procedure: Lower Extremity Angiography;  Surgeon: Nada Libman, MD;  Location: [facility] INVASIVE CV LAB;  Service: Cardiovascular;  Laterality: N/A;  . PERIPHERAL VASCULAR CATHETERIZATION Right 01/04/2016   Procedure: Peripheral Vascular Atherectomy;  Surgeon: Nada Libman, MD;  Location: [facility] INVASIVE CV LAB;  Service: Cardiovascular;  Laterality: Right;  Peroneal  . PERIPHERAL VASCULAR CATHETERIZATION N/A 02/01/2016   Procedure: Abdominal Aortogram w/Lower Extremity;  Surgeon: Nada Libman, MD;  Location: [facility] INVASIVE CV LAB;  Service: Cardiovascular;  Laterality: N/A;  . PERIPHERAL VASCULAR CATHETERIZATION  02/01/2016   Procedure: Peripheral Vascular Atherectomy;  Surgeon: Nada Libman, MD;  Location: [facility] INVASIVE CV LAB;  Service: Cardiovascular;;  Left   . THYROID SURGERY      No Known Allergies  No current outpatient prescriptions on file.   No current facility-administered medications for this  visit.     ROS: See HPI for pertinent positives and negatives.   Physical Examination  Vitals:   05/25/16 1240  BP: (!) 152/64  Pulse: (!) 56  Resp: 18  Temp: 97.6 F (36.4 C)  TempSrc: Oral  SpO2: 99%  Height: 5\' 5"  (1.651 m)   There is no height or weight on file to calculate BMI.  General: A&O x 3, WDWN, thin elderly female that appears younger than her stated age. Gait: not able  to walk with feet ulcers Eyes: PERRLA. Pulmonary: Respirations are non labored, CTAB, good air movement Cardiac: regular rhythm, bradycardic rate , no detected murmur.         Carotid Bruits Right Left   Negative Negative  Aorta is not palpable. Radial pulses: are 2+ palpable bilaterally                            VASCULAR EXAM: Extremities with ischemic changes, without Gangrene; with open wounds.  Left heel ulcer measures 4 cm x 4 cm, about 3 mm depth (was 8 cm x 4 cm, about 4 mm depth on 04-10-16) Right heel ulcer measures 2.5 cm x 1.25 cm, about 2 mm depth (was 3 cm x 3 cm, about 2-3 mm depth) Right foot lateral ulcer measures 1 cm x 1.25 cm about 3 mm depth (was 2 cm x 2 cm, about 3-4 mm depth), edges are contracting.  Thin sparse patches of non purulent yellow fibrinous tissue interspersed with red healthy appearing granulation tissue, at the base of both heel ulcers. Right great toe is surgically absent, this site is well healed.    Non-Invasive Vascular Imaging  ABI (Date: 05/25/2016)  R:   ABI: 0.93 (0.95, calcified, 02-28-16),   PT: mono  DP: mono  TBI:  Great toe is surgically absent   L:   ABI: 0.83 (0.95, calcified, 02-28-16),   PT: mono  DP: mono  TBI: 0.43  Bilateral LE arterial Duplex (02-28-16): Technically difficult exam due to patient's inability to be properly positioned and calcific plaque throughout bilaterally preventing thorough visualization.  Unable to visualize a focal stenosis in the right LE, color flow disturbance noted throughout, with doubling of velocity in the distal SFA suggestive of 50-74% stenosis. Unable to visualize a focal stenosis in the left LE, color flow disturbance noted throughout with an increased velocity in the mid SFA in the 50-77% range.  All waveforms are monophasic except bilateral common femoral artery which are biphasic.    ASSESSMENT: [name] is a 83 y.o. female who presents s/p atherectomy of right  peroneal artery, angioplasty of the right peroneal artery and tibioperoneal trunk on 01/04/16 and then underwent right great toe amputation on 01/05/16.   Bilateral heel and lateral right foot ulcers are granulating and contracting slowly, no longer need debridement, see Plan.  Ulcers are probably secondary to both venous stasis and peripheral artery disease.  She has minimal edema in her lower legs and feet.  She is unable to wear shoes to walk until heel and lateral right foot ulcers heel, see Plan.    PLAN:  Daily seated leg and arm exercises as discussed with pt and son and demonstrated, NH staff to supervise this daily. Stop Santyl; start daily Hydrogel dressing changes to feet ulcers.  Based on the patient's vascular studies and examination, and after discussing with Dr. Darrick Penna, pt will return to clinic in 6 weeks with bilateral feet ulcer evaluation.  I discussed in depth with the patient the nature of atherosclerosis, and emphasized the importance of maximal medical management including strict control of blood pressure, blood glucose, and lipid levels, obtaining regular exercise, and continued cessation of smoking.  The patient is aware that without maximal medical management the underlying atherosclerotic disease process will progress, limiting the benefit of any interventions.  The patient was given information about PAD including signs, symptoms, treatment, what symptoms should prompt the patient to seek immediate medical care, and risk reduction measures to take.  Charisse MarchSuzanne Nickel, RN, MSN, FNP-C [facility] [place] Office Phone: [phone]  [facility]  05/25/16 12:52 PM

## 2016-06-19 ENCOUNTER — Encounter: Payer: Self-pay | Admitting: Internal Medicine

## 2016-06-19 ENCOUNTER — Non-Acute Institutional Stay (SKILLED_NURSING_FACILITY): Payer: Medicare Other | Admitting: Internal Medicine

## 2016-06-19 DIAGNOSIS — I10 Essential (primary) hypertension: Secondary | ICD-10-CM | POA: Diagnosis not present

## 2016-06-19 DIAGNOSIS — R600 Localized edema: Secondary | ICD-10-CM | POA: Diagnosis not present

## 2016-06-19 DIAGNOSIS — I480 Paroxysmal atrial fibrillation: Secondary | ICD-10-CM | POA: Diagnosis not present

## 2016-06-19 DIAGNOSIS — E039 Hypothyroidism, unspecified: Secondary | ICD-10-CM | POA: Diagnosis not present

## 2016-06-19 DIAGNOSIS — N183 Chronic kidney disease, stage 3 unspecified: Secondary | ICD-10-CM

## 2016-06-19 DIAGNOSIS — I779 Disorder of arteries and arterioles, unspecified: Secondary | ICD-10-CM | POA: Diagnosis not present

## 2016-06-19 NOTE — Progress Notes (Signed)
Location:   [facility] Room Number: 111/W Place of Service:  SNF [phone] Provider:  Fanny DanceAnjali,Kellon Chalk  Golding, John, MD  Patient Care Team: Assunta FoundGolding, John, MD as PCP - General (Family Medicine) [name], Dorothe PeaJonathan F, MD as Consulting Physician (Cardiology) Cliffton Astersampbell, John, MD as Consulting Physician (Infectious Diseases) [facility])  Extended Emergency Contact Information Primary Emergency Contact: [name],[name]  [place] Home Phone: [phone] Mobile Phone: [phone] Relation: None Secondary Emergency Contact: [name],April  [place] Home Phone: [phone] Relation: None  Code Status:  DNR Goals of care: Advanced Directive information Advanced Directives 06/19/2016  Does Patient Have a Medical Advance Directive? Yes  Type of Advance Directive Out of facility DNR (pink MOST or yellow form)  Does patient want to make changes to medical advance directive? No - Patient declined  Copy of Healthcare Power of Attorney in Chart? -  Would patient like information on creating a medical advance directive? No - Patient declined     Chief Complaint  Patient presents with  . Medical Management of Chronic Issues    Routine Visit    HPI:  Pt is a 83 y.o. female seen today for medical management of chronic diseases.    Patient has Patient has H/O CAD with Cath done in 2008 on Medical management, Paroxysmal Atrial Fibrillation Not on any Anticoagulation, Hypertension, Hyperlipidemia. Staph Bacteremia in 12/25, Right Great Toe Amputation in 01/18 And left Peroneal Atherectomy.  Patient continue to have non healing ulcers in her feet B/L and she follows with Vascular surgery. Wounds are difficult to heal due to her poor circulation and venous stasis.  Last visit the wounds ar doing well requiring no Debridement. Patient weight is back to 122 lbs on her low dose of Lasix.her pain is controlled and she  is trying to walk with the walker and able to transfer. No New Nursing issues. Constipation resolved with senna.     Past Medical History:  Diagnosis Date  . Acid reflux   . Carotid artery occlusion    minimal  . Coronary artery disease   . Hypercholesteremia   . Hypertension   . Myocardial infarct (HCC)   . PAF (paroxysmal atrial fibrillation) (HCC)   . Skin disorder   . Systolic murmur   . Thyroid disease    Past Surgical History:  Procedure Laterality Date  . ABDOMINAL HYSTERECTOMY    . AMPUTATION Right 01/05/2016   Procedure: RIGHT GREAT TOE AMPUTATION;  Surgeon: Nada LibmanVance W Brabham, MD;  [facility];  Service: Vascular;  Laterality: Right;  . CARDIAC CATHETERIZATION    . CARDIOVERSION  2012  . IR GENERIC HISTORICAL  12/30/2015   IR ANGIOGRAM EXTREMITY BILATERAL 12/30/2015 [facility]  . IR GENERIC HISTORICAL  12/30/2015   IR ANGIOGRAM SELECTIVE EACH ADDITIONAL VESSEL 12/30/2015 [facility]  . LOWER EXTREMITY ANGIOGRAM Right 12/30/2015   Procedure: RIGHT LOWER EXTREMITY ANGIOGRAM;  Surgeon: Ancil LinseyJason Evan Davis, MD;  Location: [facility];  Service: Vascular;  Laterality: Right;  accessed through left groin area - dressed with two 2x2 and 4x4 opsite  . PERIPHERAL VASCULAR CATHETERIZATION N/A 01/04/2016   Procedure: Lower Extremity Angiography;  Surgeon: Nada LibmanVance W Brabham, MD;  [facility];  Service: Cardiovascular;  Laterality: N/A;  . PERIPHERAL VASCULAR CATHETERIZATION Right 01/04/2016   Procedure: Peripheral Vascular Atherectomy;  Surgeon: Nada LibmanVance W Brabham, MD;  Location: [facility] INVASIVE CV LAB;  Service: Cardiovascular;  Laterality: Right;  Peroneal  . PERIPHERAL VASCULAR  CATHETERIZATION N/A 02/01/2016   Procedure: Abdominal Aortogram w/Lower Extremity;  Surgeon: Nada Libman, MD;  Location: [facility] INVASIVE CV LAB;  Service: Cardiovascular;  Laterality: N/A;  . PERIPHERAL VASCULAR CATHETERIZATION  02/01/2016   Procedure: Peripheral Vascular Atherectomy;  Surgeon:  Nada Libman, MD;  Location: [facility] INVASIVE CV LAB;  Service: Cardiovascular;;  Left   . THYROID SURGERY      No Known Allergies  Outpatient Encounter Prescriptions as of 06/19/2016  Medication Sig  . acetaminophen (TYLENOL) 325 MG tablet Take 650 mg by mouth every 4 (four) hours as needed.   Marland Kitchen amiodarone (PACERONE) 200 MG tablet Take 0.5 tablets (100 mg total) by mouth daily.  Marland Kitchen amLODipine (NORVASC) 10 MG tablet Take 10 mg by mouth daily.  Marland Kitchen aspirin EC 81 MG tablet Take 1 tablet (81 mg total) by mouth daily.  Marland Kitchen atorvastatin (LIPITOR) 20 MG tablet Take 20 mg by mouth every other day. Take 1 tablet (20 mg) by mouth every other night  . docusate sodium (COLACE) 100 MG capsule Take 100 mg by mouth daily.  . ferrous sulfate (QC FERROUS SULFATE) 325 (65 FE) MG tablet Take 325 mg by mouth daily with breakfast.  . furosemide (LASIX) 20 MG tablet Give 1 tablet by mouth daily and prn as needed  . levothyroxine (SYNTHROID, LEVOTHROID) 75 MCG tablet Take 75 mcg by mouth daily.    . magnesium hydroxide (MILK OF MAGNESIA) 400 MG/5ML suspension Take 30 mLs by mouth daily as needed for mild constipation.  Marland Kitchen omeprazole (PRILOSEC) 40 MG capsule Take 40 mg by mouth at bedtime.  . senna (SENOKOT) 8.6 MG TABS tablet Take 2 tablets by mouth daily.   . vitamin B-12 (CYANOCOBALAMIN) 1000 MCG tablet Take 1,000 mcg by mouth daily.  . [DISCONTINUED] collagenase (SANTYL) ointment Apply 1 application topically daily.   No facility-administered encounter medications on file as of 06/19/2016.     Review of Systems  Review of Systems  Constitutional: Negative for activity change, appetite change, chills, diaphoresis, fatigue and fever.  HENT: Negative for mouth sores, postnasal drip, rhinorrhea, sinus pain and sore throat.   Respiratory: Negative for apnea, cough, chest tightness, shortness of breath and wheezing.   Cardiovascular: Negative for chest pain, palpitations and leg swelling.  Gastrointestinal: Negative  for abdominal distention, abdominal pain, constipation, diarrhea, nausea and vomiting.  Genitourinary: Negative for dysuria and frequency.  Musculoskeletal: Negative for arthralgias, joint swelling and myalgias.  Skin: Negative for rash.  Neurological: Negative for dizziness, syncope, weakness, light-headedness and numbness.  Psychiatric/Behavioral: Negative for behavioral problems, confusion and sleep disturbance.      There is no immunization history on file for this patient. Pertinent  Health Maintenance Due  Topic Date Due  . DEXA SCAN  02/09/1987  . INFLUENZA VACCINE  12/02/2016 (Originally 08/02/2016)  . PNA vac Low Risk Adult (1 of 2 - PCV13) 12/02/2016 (Originally 02/09/1987)   No flowsheet data found. Functional Status Survey:    Vitals:   06/11/16 1515  BP: (!) 124/56  Pulse: 89  Resp: (!) 28  Temp: 97.7 F (36.5 C)  TempSrc: Oral  Weight: 118 lb 9.6 oz (53.8 kg)  Height: 5\' 5"  (1.651 m)   Body mass index is 19.74 kg/m. Physical Exam  Constitutional: She is oriented to person, place, and time. She appears well-developed and well-nourished.  HENT:  Head: Normocephalic.  Mouth/Throat: Oropharynx is clear and moist.  Eyes: Pupils are equal, round, and reactive to light.  Neck: Neck supple.  Cardiovascular: Normal  rate and normal heart sounds.   Pulmonary/Chest: Effort normal and breath sounds normal. No respiratory distress. She has no wheezes. She has no rales.  Abdominal: Bowel sounds are normal. She exhibits no distension. There is no tenderness. There is no rebound.  Musculoskeletal:  Has moderate edema b/l Her both Wounds just had Dressing changed.  Neurological: She is alert and oriented to person, place, and time.  Skin: Skin is warm and dry.  Psychiatric: She has a normal mood and affect. Her behavior is normal. Thought content normal.    Labs reviewed:  Recent Labs  04/10/16 0700 05/12/16 0700 05/23/16 0840  NA 141 140 142  K 4.0 3.9 4.1  CL  107 103 104  CO2 26 29 29   GLUCOSE 77 90 81  BUN 36* 33* 38*  CREATININE 1.29* 1.30* 1.29*  CALCIUM 8.3* 8.9 9.0  MG  --  2.1  --     Recent Labs  01/17/16 1718 03/07/16 0600 05/12/16 0700  AST 22 13* 19  ALT <5* 10* 15  ALKPHOS 95 78 97  BILITOT 0.5 0.5 0.3  PROT 7.0 6.5 7.3  ALBUMIN 2.9* 2.9* 3.6    Recent Labs  03/07/16 0700 04/10/16 0700 05/12/16 0700  WBC 7.4 4.1 7.4  NEUTROABS 5.2 2.5 5.0  HGB 10.3* 9.9* 11.6*  HCT 32.4* 31.7* 36.8  MCV 90.8 86.4 82.9  PLT 388 317 327   Lab Results  Component Value Date   TSH 2.487 03/07/2016   No results found for: HGBA1C Lab Results  Component Value Date   CHOL 105 03/07/2016   HDL 38 (L) 03/07/2016   LDLCALC 57 03/07/2016   TRIG 50 03/07/2016   CHOLHDL 2.8 03/07/2016    Significant Diagnostic Results in last 30 days:  No results found.  Assessment/Plan  peripheral arterial Disease with Leg Ulcers   Still has wounds on the heel but doing well. She is followed by Vascular And per their notes she is healing well  Essential hypertension BP is better on Norvasc.And Lasix   Paroxysmal atrial fibrillation  Rate controlled Never been on Anticoagulation continue aspirin and amiodarone  Bilateral leg edema Continue Lasix. Renal function stable Repeat Labs in 2 weeks to follow Potassium.  Chronic renal insufficiency, stage 3 (moderate) Creat stable Repeat labs on Lasix  Hyperlipidemia LDL 57 on statin  Hypothyroidism TSH normal in 03/18  Family/ staff Communication:   Labs/tests ordered:    Total time spent in this patient care encounter was 25_ minutes; greater than 50% of the visit spent counseling patient and coordinating care for problems addressed at this encounter.

## 2016-06-22 ENCOUNTER — Encounter: Payer: Self-pay | Admitting: Family

## 2016-06-23 ENCOUNTER — Encounter: Payer: Self-pay | Admitting: Internal Medicine

## 2016-06-23 ENCOUNTER — Non-Acute Institutional Stay (SKILLED_NURSING_FACILITY): Payer: Medicare Other | Admitting: Internal Medicine

## 2016-06-23 DIAGNOSIS — R059 Cough, unspecified: Secondary | ICD-10-CM

## 2016-06-23 DIAGNOSIS — R05 Cough: Secondary | ICD-10-CM

## 2016-06-23 DIAGNOSIS — N289 Disorder of kidney and ureter, unspecified: Secondary | ICD-10-CM | POA: Diagnosis not present

## 2016-06-23 DIAGNOSIS — I4891 Unspecified atrial fibrillation: Secondary | ICD-10-CM | POA: Diagnosis not present

## 2016-06-23 NOTE — Progress Notes (Signed)
Location:   [facility] Room Number: 111/W Place of Service:  SNF [phone]) Provider:  Andrew AuArlo Breylan Lefevers  Golding, John, MD  Patient Care Team: Assunta FoundGolding, John, MD as PCP - General (Family Medicine) [name], Dorothe PeaJonathan F, MD as Consulting Physician (Cardiology) Cliffton Astersampbell, John, MD as Consulting Physician (Infectious Diseases) [facility])  Extended Emergency Contact Information Primary Emergency Contact: [name],[name]  [place] Home Phone: [phone] Mobile Phone: [phone] Relation: None Secondary Emergency Contact: [name],April  [place] Home Phone: [phone] Relation: None  Code Status:  DNR Goals of care: Advanced Directive information Advanced Directives 06/23/2016  Does Patient Have a Medical Advance Directive? Yes  Type of Advance Directive Out of facility DNR (pink MOST or yellow form)  Does patient want to make changes to medical advance directive? No - Patient declined  Copy of Healthcare Power of Attorney in Chart? -  Would patient like information on creating a medical advance directive? No - Patient declined     Chief Complaint  Patient presents with  . Acute Visit    Coughing    HPI:  Pt is a 83 y.o. female seen today for an acute visit for  Complaints of a dry cough apparently this developed over the last day or 2-she does not complain of any production says it is dry cough-she is not complaining of any increased shortness of breath or decreased energy says she feels fine cough does bother her.  Vital signs are stable.  She says she's taking Robitussin past with good relief.   Patient has Patient has H/O CAD with Cath done in 2008 on Medical management, Paroxysmal Atrial Fibrillation Not on any Anticoagulation, Hypertension, Hyperlipidemia. Staph Bacteremia in 12/25, Right Great Toe Amputation in 01/18 And left Peroneal Atherectomy.  She does have a history of lower  extremity edema she is on Lasix as needed her weight has been stable she does have renalinsuff  stable last creatinine was 1.29 BUN of 38 back in May we will update this    Past Medical History:  Diagnosis Date  . Acid reflux   . Carotid artery occlusion    minimal  . Coronary artery disease   . Hypercholesteremia   . Hypertension   . Myocardial infarct (HCC)   . PAF (paroxysmal atrial fibrillation) (HCC)   . Skin disorder   . Systolic murmur   . Thyroid disease    Past Surgical History:  Procedure Laterality Date  . ABDOMINAL HYSTERECTOMY    . AMPUTATION Right 01/05/2016   Procedure: RIGHT GREAT TOE AMPUTATION;  Surgeon: Nada LibmanVance W Brabham, MD;  [facility];  Service: Vascular;  Laterality: Right;  . CARDIAC CATHETERIZATION    . CARDIOVERSION  2012  . IR GENERIC HISTORICAL  12/30/2015   IR ANGIOGRAM EXTREMITY BILATERAL 12/30/2015 [facility]  . IR GENERIC HISTORICAL  12/30/2015   IR ANGIOGRAM SELECTIVE EACH ADDITIONAL VESSEL 12/30/2015 [facility]  . LOWER EXTREMITY ANGIOGRAM Right 12/30/2015   Procedure: RIGHT LOWER EXTREMITY ANGIOGRAM;  Surgeon: Ancil LinseyJason Evan Davis, MD;  Location: [facility];  Service: Vascular;  Laterality: Right;  accessed through left groin area - dressed with two 2x2 and 4x4 opsite  . PERIPHERAL VASCULAR CATHETERIZATION N/A 01/04/2016   Procedure: Lower Extremity Angiography;  Surgeon: Nada LibmanVance W Brabham, MD;  [facility];  Service: Cardiovascular;  Laterality: N/A;  . PERIPHERAL VASCULAR CATHETERIZATION Right 01/04/2016   Procedure: Peripheral Vascular Atherectomy;  Surgeon: Nada LibmanVance W Brabham, MD;  Location: [facility] INVASIVE CV LAB;  Service: Cardiovascular;  Laterality: Right;  Peroneal  . PERIPHERAL VASCULAR CATHETERIZATION N/A 02/01/2016   Procedure: Abdominal Aortogram w/Lower Extremity;  Surgeon: Nada Libman, MD;  Location: [facility] INVASIVE CV LAB;  Service: Cardiovascular;  Laterality: N/A;  . PERIPHERAL VASCULAR CATHETERIZATION  02/01/2016    Procedure: Peripheral Vascular Atherectomy;  Surgeon: Nada Libman, MD;  Location: [facility] INVASIVE CV LAB;  Service: Cardiovascular;;  Left   . THYROID SURGERY      No Known Allergies  Outpatient Encounter Prescriptions as of 06/23/2016  Medication Sig  . acetaminophen (TYLENOL) 325 MG tablet Take 650 mg by mouth every 4 (four) hours as needed.   Marland Kitchen amiodarone (PACERONE) 200 MG tablet Take 0.5 tablets (100 mg total) by mouth daily.  Marland Kitchen amLODipine (NORVASC) 10 MG tablet Take 10 mg by mouth daily.  Marland Kitchen aspirin EC 81 MG tablet Take 1 tablet (81 mg total) by mouth daily.  Marland Kitchen atorvastatin (LIPITOR) 20 MG tablet Take 20 mg by mouth every other day. Take 1 tablet (20 mg) by mouth every other night  . docusate sodium (COLACE) 100 MG capsule Take 100 mg by mouth daily.  . ferrous sulfate (QC FERROUS SULFATE) 325 (65 FE) MG tablet Take 325 mg by mouth daily with breakfast.  . furosemide (LASIX) 20 MG tablet Give 1 tablet by mouth daily and prn as needed  . levothyroxine (SYNTHROID, LEVOTHROID) 75 MCG tablet Take 75 mcg by mouth daily.    . magnesium hydroxide (MILK OF MAGNESIA) 400 MG/5ML suspension Take 30 mLs by mouth daily as needed for mild constipation.  Marland Kitchen omeprazole (PRILOSEC) 40 MG capsule Take 40 mg by mouth at bedtime.  . senna (SENOKOT) 8.6 MG TABS tablet Take 2 tablets by mouth daily.   . vitamin B-12 (CYANOCOBALAMIN) 1000 MCG tablet Take 1,000 mcg by mouth daily.   No facility-administered encounter medications on file as of 06/23/2016.     Review of Systems In general does not complaining of any fever or chills.  Skin does have a history of nonhealing ulcers in her feet bilaterally is followed by vascular surgery  Head ears nose mouth and throat is not complaining of any sore throat or eye drainage or visual changes.  Respiratory denies shortness of breath but does have a dry cough.  Cardiac does not complaining of chest pain edema appears to be quite stable for some time.  GI is not  complaining of any nausea vomiting diarrhea constipation or abdominal discomfort.  Musculoskeletal not currently complaining of joint pain.  Neurologic does not complain of dizziness or headache  Psych continues to be in good spirits does not complain of depression or anxiety nursing staff has not noted any recent acute issues in this regard  There is no immunization history on file for this patient. Pertinent  Health Maintenance Due  Topic Date Due  . DEXA SCAN  02/09/1987  . INFLUENZA VACCINE  12/02/2016 (Originally 08/02/2016)  . PNA vac Low Risk Adult (1 of 2 - PCV13) 12/02/2016 (Originally 02/09/1987)   No flowsheet data found. Functional Status Survey:    Vitals:   06/23/16 1132  BP: (!) 136/58  Pulse: (!) 57  Resp: 18  Temp: 97.7 F (36.5 C)  TempSrc: Oral  O2 saturation is 100% on room air--I got a pulse of 60 on auscultation Weight is 120.4 pounds Physical Exam  In general this is a well-nourished elderly female in no distress sitting comfortably in her wheelchair.  Her skin  is warm and dry she does have the bilateral heel ulcers followed by wound care and vascular surgery.  Eyes sclera and conjunctiva are clear visual acuity appears grossly intact.  Oropharynx clear mucous membranes moist I did not note any drainage.  Heart is regular rate and rhythm she has mild lower extremity edema appears to be baseline.  Abdomen is soft nontender with positive bowel sounds.  Musculoskeletal is able to move all extremities 4 family's well in a wheelchair baseline exam.  Neurologic is grossly intact her speech is clear no lateralizing findings.     Labs reviewed:  Recent Labs  04/10/16 0700 05/12/16 0700 05/23/16 0840  NA 141 140 142  K 4.0 3.9 4.1  CL 107 103 104  CO2 26 29 29   GLUCOSE 77 90 81  BUN 36* 33* 38*  CREATININE 1.29* 1.30* 1.29*  CALCIUM 8.3* 8.9 9.0  MG  --  2.1  --     Recent Labs  01/17/16 1718 03/07/16 0600 05/12/16 0700  AST 22 13* 19   ALT <5* 10* 15  ALKPHOS 95 78 97  BILITOT 0.5 0.5 0.3  PROT 7.0 6.5 7.3  ALBUMIN 2.9* 2.9* 3.6    Recent Labs  03/07/16 0700 04/10/16 0700 05/12/16 0700  WBC 7.4 4.1 7.4  NEUTROABS 5.2 2.5 5.0  HGB 10.3* 9.9* 11.6*  HCT 32.4* 31.7* 36.8  MCV 90.8 86.4 82.9  PLT 388 317 327   Lab Results  Component Value Date   TSH 2.487 03/07/2016   No results found for: HGBA1C Lab Results  Component Value Date   CHOL 105 03/07/2016   HDL 38 (L) 03/07/2016   LDLCALC 57 03/07/2016   TRIG 50 03/07/2016   CHOLHDL 2.8 03/07/2016    Significant Diagnostic Results in last 30 days:  No results found.  Assessment/Plan  #1 coughs-clinically she appears stable will order Robitussin DM 10 mL every 6 hours routine for 72 hours and then when necessary for 5 additional days-also will order a 2 view chest x-ray-monitor vital signs with pulse ox every shift for 72 hours.  #2 history of atrial fibrillation this appears rate controlled she is not on anticoagulation except for aspirin she is on amiodarone rate control I got a pulse in the 60s today at times she does have pulses in the 50s but is asymptomatic at this point will monitor.  #3 history of bilateral leg edema this appears stable she is on Lasix when necessary-Will update metabolic panel history of stage III chronic renal failure.  [id number]    [id number]

## 2016-06-26 ENCOUNTER — Encounter (HOSPITAL_COMMUNITY)
Admission: AD | Admit: 2016-06-26 | Discharge: 2016-06-26 | Disposition: A | Discharge status: Home / Self Care | Payer: Medicare Other | Source: Skilled Nursing Facility | Attending: Internal Medicine | Admitting: Internal Medicine

## 2016-06-26 DIAGNOSIS — Z48812 Encounter for surgical aftercare following surgery on the circulatory system: Secondary | ICD-10-CM | POA: Insufficient documentation

## 2016-06-26 DIAGNOSIS — I739 Peripheral vascular disease, unspecified: Secondary | ICD-10-CM | POA: Insufficient documentation

## 2016-06-26 DIAGNOSIS — K219 Gastro-esophageal reflux disease without esophagitis: Secondary | ICD-10-CM | POA: Diagnosis not present

## 2016-06-26 DIAGNOSIS — I5022 Chronic systolic (congestive) heart failure: Secondary | ICD-10-CM | POA: Diagnosis present

## 2016-06-26 LAB — BASIC METABOLIC PANEL
Anion gap: 8 (ref 5–15)
BUN: 45 mg/dL — ABNORMAL HIGH (ref 6–20)
CHLORIDE: 105 mmol/L (ref 101–111)
CO2: 28 mmol/L (ref 22–32)
CREATININE: 1.59 mg/dL — AB (ref 0.44–1.00)
Calcium: 8.6 mg/dL — ABNORMAL LOW (ref 8.9–10.3)
GFR calc non Af Amer: 27 mL/min — ABNORMAL LOW (ref >60)
GFR, EST AFRICAN AMERICAN: 31 mL/min — AB (ref >60)
Glucose, Bld: 81 mg/dL (ref 65–99)
POTASSIUM: 3.6 mmol/L (ref 3.5–5.1)
SODIUM: 141 mmol/L (ref 135–145)

## 2016-06-27 ENCOUNTER — Encounter: Payer: Self-pay | Admitting: Internal Medicine

## 2016-06-27 ENCOUNTER — Non-Acute Institutional Stay (SKILLED_NURSING_FACILITY): Payer: Medicare Other | Admitting: Internal Medicine

## 2016-06-27 DIAGNOSIS — I10 Essential (primary) hypertension: Secondary | ICD-10-CM | POA: Diagnosis not present

## 2016-06-27 DIAGNOSIS — I4891 Unspecified atrial fibrillation: Secondary | ICD-10-CM

## 2016-06-27 DIAGNOSIS — N183 Chronic kidney disease, stage 3 unspecified: Secondary | ICD-10-CM

## 2016-06-27 NOTE — Progress Notes (Signed)
Location:    Nursing Home Room Number: 111/W Place of Service:  SNF (31) Provider:  Andrew Au, MD  Patient Care Team: Assunta Found, MD as PCP - General (Family Medicine) Wyline Mood [name], MD as Consulting Physician (Cardiology) Cliffton Asters, MD as Consulting Physician (Infectious Diseases) [facility])  Extended Emergency Contact Information Primary Emergency Contact: Casimer Lanius [place] Home Phone: [phone] Mobile Phone: [phone] Relation: None Secondary Emergency Contact: Margarito Courser [place] Home Phone: [phone] Relation: None   Goals of care: Advanced Directive information Advanced Directives 06/27/2016  Does Patient Have a Medical Advance Directive? Yes  Type of Advance Directive Out of facility DNR (pink MOST or yellow form)  Does patient want to make changes to medical advance directive? No - Patient declined  Copy of Healthcare Power of Attorney in Chart? -  Would patient like information on creating a medical advance directive? No - Patient declined     Chief Complaint  Patient presents with  . Acute Visit    Renal Function     HPI:  Pt is a 83 y.o. female seen today for an acute visit for Elevation of her creatinine.  She is on Lasix 20 mg a day with a history of grade 2 diastolic dysfunction per echo done in January of this year   baseline creatinine appears to be about 1.3 however lab done yesterday shows it had risen up to 1.59 with BUN of 45 previous BUNs have been largely in the 30s.    She not complaining of any shortness of breath or chest pain continues to do very well with supportive care.  She has had some variability but appears to be relatively stabilized at 120 pounds most recently she has baseline lower extremity edema which is been stable for some time   does have a history coronary artery disease with Done in 2008 on medical  management also history of A. fib not on any anticoagulation-hypertension hyperlipidemia.  She does have nonhealing ulcers in her left feet these are followed by vascular surgery as well as wound care are currently wrapped.     Past Medical History:  Diagnosis Date  . Acid reflux   . Carotid artery occlusion    minimal  . Coronary artery disease   . Hypercholesteremia   . Hypertension   . Myocardial infarct (HCC)   . PAF (paroxysmal atrial fibrillation) (HCC)   . Skin disorder   . Systolic murmur   . Thyroid disease    Past Surgical History:  Procedure Laterality Date  . ABDOMINAL HYSTERECTOMY    . AMPUTATION Right 01/05/2016   Procedure: RIGHT GREAT TOE AMPUTATION;  Surgeon: Nada Libman, MD;  Location: [facility] OR;  Service: Vascular;  Laterality: Right;  . CARDIAC CATHETERIZATION    . CARDIOVERSION  2012  . IR GENERIC HISTORICAL  12/30/2015   IR ANGIOGRAM EXTREMITY BILATERAL 12/30/2015 [facility]  . IR GENERIC HISTORICAL  12/30/2015   IR ANGIOGRAM SELECTIVE EACH ADDITIONAL VESSEL 12/30/2015 [facility]  . LOWER EXTREMITY ANGIOGRAM Right 12/30/2015   Procedure: RIGHT LOWER EXTREMITY ANGIOGRAM;  Surgeon: Ancil Linsey, MD;  Location: [facility];  Service: Vascular;  Laterality: Right;  accessed through left groin area - dressed with two 2x2 and 4x4 opsite  . PERIPHERAL VASCULAR CATHETERIZATION N/A 01/04/2016   Procedure: Lower Extremity Angiography;  Surgeon: Nada Libman, MD;  Location[facility] CV LAB;  Service: Cardiovascular;  Laterality: N/A;  .  PERIPHERAL VASCULAR CATHETERIZATION Right 01/04/2016   Procedure: Peripheral Vascular Atherectomy;  Surgeon: Nada LibmanVance W Brabham, MD;  Location: [facility] INV[facility] CV LAB;  Service: Cardiovascular;  Laterality: Right;  Peroneal  . PERIPHERAL VASCULAR CATHETERIZATION N/A 02/01/2016   Procedure: Abdominal Aortogram w/Lower Extremity;  Surgeon: Nada LibmanVance W Brabham, MD;  Location: [facility] INVASIVE CV LAB;  Service: Cardiovascular;  Laterality:  N/A;  . PERIPHERAL VASCULAR CATHETERIZATION  02/01/2016   Procedure: Peripheral Vascular Atherectomy;  Surgeon: Nada LibmanVance W Brabham, MD;  Location: [facility] INVASIVE CV LAB;  Service: Cardiovascular;;  Left   . THYROID SURGERY      No Known Allergies  Outpatient Encounter Prescriptions as of 06/27/2016  Medication Sig  . acetaminophen (TYLENOL) 325 MG tablet Take 650 mg by mouth every 4 (four) hours as needed.   Marland Kitchen. amiodarone (PACERONE) 200 MG tablet Take 0.5 tablets (100 mg total) by mouth daily.  Marland Kitchen. amLODipine (NORVASC) 10 MG tablet Take 10 mg by mouth daily.  Marland Kitchen. aspirin EC 81 MG tablet Take 1 tablet (81 mg total) by mouth daily.  Marland Kitchen. atorvastatin (LIPITOR) 20 MG tablet Take 20 mg by mouth every other day. Take 1 tablet (20 mg) by mouth every other night  . Dextromethorphan-Guaifenesin (ROBITUSSIN DM PO) Take 10 mLs by mouth every 6 (six) hours as needed.  . docusate sodium (COLACE) 100 MG capsule Take 100 mg by mouth daily.  . ferrous sulfate (QC FERROUS SULFATE) 325 (65 FE) MG tablet Take 325 mg by mouth daily with breakfast.  . furosemide (LASIX) 20 MG tablet Give 1 tablet by mouth daily and prn as needed  . levothyroxine (SYNTHROID, LEVOTHROID) 75 MCG tablet Take 75 mcg by mouth daily.    . magnesium hydroxide (MILK OF MAGNESIA) 400 MG/5ML suspension Take 30 mLs by mouth daily as needed for mild constipation.  Marland Kitchen. omeprazole (PRILOSEC) 40 MG capsule Take 40 mg by mouth at bedtime.  . senna (SENOKOT) 8.6 MG TABS tablet Take 2 tablets by mouth daily.   . vitamin B-12 (CYANOCOBALAMIN) 1000 MCG tablet Take 1,000 mcg by mouth daily.   No facility-administered encounter medications on file as of 06/27/2016.     Review of Systems In general does not complaining of any fever or chills.  Skin does have a history of nonhealing ulcers in her feet bilaterally is followed by vascular surgery  Head ears nose mouth and throat is not complaining of any sore throat or eye drainage or visual  changes.  Respiratory denies shortness of breath or increased cough  Cardiac does not complaining of chest pain edema appears to be quite stable for some time.  GI is not complaining of any nausea vomiting diarrhea constipation or abdominal discomfort.  Musculoskeletal not currently complaining of joint pain.  Neurologic does not complain of dizziness or headache  Psych continues to be in good spirits does not complain of depression or anxiety nursing staff has not noted any recent acute issues in this regard   There is no immunization history on file for this patient. Pertinent  Health Maintenance Due  Topic Date Due  . DEXA SCAN  02/09/1987  . INFLUENZA VACCINE  12/02/2016 (Originally 08/02/2016)  . PNA vac Low Risk Adult (1 of 2 - PCV13) 12/02/2016 (Originally 02/09/1987)   No flowsheet data found. Functional Status Survey:    Vitals:   06/25/16 1243  BP: (!) 142/63  Pulse: (!) 59  Resp: 18  Temp: 97.2 F (36.2 C)  TempSrc: Oral  Weight: 119 lb 6.4 oz (54.2 kg)  Manual recheck a blood pressure was 124/66 appears baseline systolics very from 120s to 140s Body mass index is 19.87 kg/m. Physical Exam In general this is a well-nourished elderly female in no distress sitting comfortably in her wheelchair.  Her skin is warm and dry she does have the bilateral heel ulcers followed by wound care and vascular surgery. These areas are currently covered  Eyes sclera and conjunctiva are clear visual acuity appears grossly intact.  Oropharynx clear mucous membranes moist I did not note any drainage.  Heart is irregular rate and rhythm she has mild lower extremity edema appears to be baseline.  Abdomen is soft nontender with positive bowel sounds.  Musculoskeletal is able to move all extremities 4 ambulates well in a wheelchair baseline exam.  does have wrapping of her feet bilaterally  Neurologic is grossly intact her speech is clear no lateralizing  findings.  Labs reviewed:  Recent Labs  05/12/16 0700 05/23/16 0840 06/26/16 0400  NA 140 142 141  K 3.9 4.1 3.6  CL 103 104 105  CO2 29 29 28   GLUCOSE 90 81 81  BUN 33* 38* 45*  CREATININE 1.30* 1.29* 1.59*  CALCIUM 8.9 9.0 8.6*  MG 2.1  --   --     Recent Labs  01/17/16 1718 03/07/16 0600 05/12/16 0700  AST 22 13* 19  ALT <5* 10* 15  ALKPHOS 95 78 97  BILITOT 0.5 0.5 0.3  PROT 7.0 6.5 7.3  ALBUMIN 2.9* 2.9* 3.6    Recent Labs  03/07/16 0700 04/10/16 0700 05/12/16 0700  WBC 7.4 4.1 7.4  NEUTROABS 5.2 2.5 5.0  HGB 10.3* 9.9* 11.6*  HCT 32.4* 31.7* 36.8  MCV 90.8 86.4 82.9  PLT 388 317 327   Lab Results  Component Value Date   TSH 2.487 03/07/2016   No results found for: HGBA1C Lab Results  Component Value Date   CHOL 105 03/07/2016   HDL 38 (L) 03/07/2016   LDLCALC 57 03/07/2016   TRIG 50 03/07/2016   CHOLHDL 2.8 03/07/2016    Significant Diagnostic Results in last 30 days:  No results found.  Assessment/Plan  #1-renal insufficiency-completed with a history of diastolic  CHF-clinically she appears stable creatinine has risen somewhat she is now on routine Lasix will hold this for a couple days and recheck lab on Thursday, June 28-also recheck next week to ensure stability  Continue to monitor clinical status although again her weight appears to be stable and edema appears to be stabilized.  #2 hypertension-manual reading was 124/66 is see occasional readings with systolic above 140s but these are not consistent at this point will monitor-she continues on Norvasc 10 mg a day as well as Lasix again we will hold this for a couple days but most likely will restart if renal function is stable  #3-atrial fibrillation this appears rate controlled on amiodarone she is on aspirin as well  [id number]

## 2016-06-29 ENCOUNTER — Other Ambulatory Visit (HOSPITAL_COMMUNITY)
Admission: RE | Admit: 2016-06-29 | Discharge: 2016-06-29 | Disposition: A | Discharge status: Home / Self Care | Payer: Medicare Other | Source: Skilled Nursing Facility | Attending: Internal Medicine | Admitting: Internal Medicine

## 2016-06-29 DIAGNOSIS — Z48812 Encounter for surgical aftercare following surgery on the circulatory system: Secondary | ICD-10-CM | POA: Insufficient documentation

## 2016-06-29 DIAGNOSIS — I5022 Chronic systolic (congestive) heart failure: Secondary | ICD-10-CM | POA: Diagnosis present

## 2016-06-29 DIAGNOSIS — K219 Gastro-esophageal reflux disease without esophagitis: Secondary | ICD-10-CM | POA: Insufficient documentation

## 2016-06-29 DIAGNOSIS — E785 Hyperlipidemia, unspecified: Secondary | ICD-10-CM | POA: Diagnosis present

## 2016-06-29 LAB — BASIC METABOLIC PANEL
Anion gap: 7 (ref 5–15)
BUN: 37 mg/dL — AB (ref 6–20)
CHLORIDE: 105 mmol/L (ref 101–111)
CO2: 29 mmol/L (ref 22–32)
Calcium: 8.9 mg/dL (ref 8.9–10.3)
Creatinine, Ser: 1.44 mg/dL — ABNORMAL HIGH (ref 0.44–1.00)
GFR calc Af Amer: 35 mL/min — ABNORMAL LOW (ref >60)
GFR calc non Af Amer: 30 mL/min — ABNORMAL LOW (ref >60)
GLUCOSE: 76 mg/dL (ref 65–99)
Potassium: 3.7 mmol/L (ref 3.5–5.1)
Sodium: 141 mmol/L (ref 135–145)

## 2016-07-03 ENCOUNTER — Encounter (HOSPITAL_COMMUNITY)
Admission: RE | Admit: 2016-07-03 | Discharge: 2016-07-03 | Disposition: A | Discharge status: Home / Self Care | Payer: Medicare Other | Source: Skilled Nursing Facility | Attending: Internal Medicine | Admitting: Internal Medicine

## 2016-07-03 DIAGNOSIS — K219 Gastro-esophageal reflux disease without esophagitis: Secondary | ICD-10-CM | POA: Insufficient documentation

## 2016-07-03 DIAGNOSIS — I739 Peripheral vascular disease, unspecified: Secondary | ICD-10-CM | POA: Insufficient documentation

## 2016-07-03 DIAGNOSIS — I5022 Chronic systolic (congestive) heart failure: Secondary | ICD-10-CM | POA: Insufficient documentation

## 2016-07-03 DIAGNOSIS — Z48812 Encounter for surgical aftercare following surgery on the circulatory system: Secondary | ICD-10-CM | POA: Insufficient documentation

## 2016-07-03 LAB — CBC WITH DIFFERENTIAL/PLATELET
Basophils Absolute: 0 10*3/uL (ref 0.0–0.1)
Basophils Relative: 0 %
Eosinophils Absolute: 0.3 10*3/uL (ref 0.0–0.7)
Eosinophils Relative: 6 %
HEMATOCRIT: 36.5 % (ref 36.0–46.0)
HEMOGLOBIN: 11.3 g/dL — AB (ref 12.0–15.0)
LYMPHS PCT: 31 %
Lymphs Abs: 1.4 10*3/uL (ref 0.7–4.0)
MCH: 26 pg (ref 26.0–34.0)
MCHC: 31 g/dL (ref 30.0–36.0)
MCV: 83.9 fL (ref 78.0–100.0)
MONOS PCT: 13 %
Monocytes Absolute: 0.6 10*3/uL (ref 0.1–1.0)
NEUTROS ABS: 2.3 10*3/uL (ref 1.7–7.7)
NEUTROS PCT: 50 %
Platelets: 306 10*3/uL (ref 150–400)
RBC: 4.35 MIL/uL (ref 3.87–5.11)
RDW: 18.9 % — ABNORMAL HIGH (ref 11.5–15.5)
WBC: 4.6 10*3/uL (ref 4.0–10.5)

## 2016-07-03 LAB — BASIC METABOLIC PANEL
ANION GAP: 9 (ref 5–15)
BUN: 38 mg/dL — ABNORMAL HIGH (ref 6–20)
CHLORIDE: 104 mmol/L (ref 101–111)
CO2: 26 mmol/L (ref 22–32)
CREATININE: 1.34 mg/dL — AB (ref 0.44–1.00)
Calcium: 8.5 mg/dL — ABNORMAL LOW (ref 8.9–10.3)
GFR calc non Af Amer: 33 mL/min — ABNORMAL LOW (ref >60)
GFR, EST AFRICAN AMERICAN: 38 mL/min — AB (ref >60)
Glucose, Bld: 73 mg/dL (ref 65–99)
Potassium: 4.2 mmol/L (ref 3.5–5.1)
Sodium: 139 mmol/L (ref 135–145)

## 2016-07-04 ENCOUNTER — Other Ambulatory Visit (HOSPITAL_COMMUNITY)
Admission: RE | Admit: 2016-07-04 | Discharge: 2016-07-04 | Disposition: A | Discharge status: Home / Self Care | Payer: Medicare Other | Source: Skilled Nursing Facility | Attending: Internal Medicine | Admitting: Internal Medicine

## 2016-07-04 DIAGNOSIS — Z48812 Encounter for surgical aftercare following surgery on the circulatory system: Secondary | ICD-10-CM | POA: Insufficient documentation

## 2016-07-04 LAB — BASIC METABOLIC PANEL
Anion gap: 9 (ref 5–15)
BUN: 37 mg/dL — AB (ref 6–20)
CHLORIDE: 106 mmol/L (ref 101–111)
CO2: 27 mmol/L (ref 22–32)
CREATININE: 1.33 mg/dL — AB (ref 0.44–1.00)
Calcium: 8.7 mg/dL — ABNORMAL LOW (ref 8.9–10.3)
GFR calc Af Amer: 38 mL/min — ABNORMAL LOW (ref >60)
GFR calc non Af Amer: 33 mL/min — ABNORMAL LOW (ref >60)
Glucose, Bld: 71 mg/dL (ref 65–99)
POTASSIUM: 3.7 mmol/L (ref 3.5–5.1)
Sodium: 142 mmol/L (ref 135–145)

## 2016-07-06 ENCOUNTER — Ambulatory Visit (INDEPENDENT_AMBULATORY_CARE_PROVIDER_SITE_OTHER): Payer: Medicare Other | Admitting: Family

## 2016-07-06 ENCOUNTER — Encounter: Payer: Self-pay | Admitting: Family

## 2016-07-06 VITALS — BP 162/62 | HR 57 | Temp 97.1°F | Resp 16 | Ht 65.0 in

## 2016-07-06 DIAGNOSIS — I779 Disorder of arteries and arterioles, unspecified: Secondary | ICD-10-CM | POA: Diagnosis not present

## 2016-07-06 DIAGNOSIS — I83004 Varicose veins of unspecified lower extremity with ulcer of heel and midfoot: Secondary | ICD-10-CM

## 2016-07-06 DIAGNOSIS — Z89411 Acquired absence of right great toe: Secondary | ICD-10-CM | POA: Diagnosis not present

## 2016-07-06 DIAGNOSIS — L97402 Non-pressure chronic ulcer of unspecified heel and midfoot with fat layer exposed: Secondary | ICD-10-CM

## 2016-07-06 DIAGNOSIS — Z9862 Peripheral vascular angioplasty status: Secondary | ICD-10-CM

## 2016-07-06 NOTE — Progress Notes (Signed)
[facility] [place]   CC: Follow up peripheral artery occlusive disease  History of Present Illness [name] is a 83 y.o. female who is s/p atherectomy right peroneal artery, angioplasty of the right peroneal artery and tibioperoneal trunk on 01/04/16 and then underwent right great toe amputation on 01/05/16 by Dr. Myra GianottiBrabham.  I last evaluated pt on 05-25-16. At that time bilateral heel and lateral right foot ulcers are granulating and contracting slowly. Ulcers are probably secondary to both venous stasis and peripheral artery disease.  She had minimal edema in her lower legs and feet.  She is unable to wear shoes to walk until heel and lateral right foot ulcers heel, see Plan.  Daily seated leg and arm exercises as discussed with pt and son and demonstrated, NH staff to supervise this daily. Stop Santyl; start daily Hydrogel dressing changes to feet ulcers.  Based on the patient's vascular studies and examination, and after discussing with Dr. Darrick PennaFields, pt will return to clinic in 6 weeks with bilateral feet ulcer evaluation.  She returns today for this.   She is residing at [facility].She states that she is walking with a walker minimally as she needs to keep pressure off of her heels and feet, therefore cannot wear shoes, but over alldoing well. Her son states that she may not doing daily seated leg exercises as advised.  Pt denies fever or chills, denies pain.   Pt Diabetic: No Pt smoker: non-smoker  Pt meds include: Statin :Yes Betablocker: No ASA: Yes Other anticoagulants/antiplatelets: no    Past Medical History:  Diagnosis Date  . Acid reflux   . Carotid artery occlusion    minimal  . Coronary artery disease   . Hypercholesteremia   . Hypertension   . Myocardial infarct (HCC)   . PAF (paroxysmal atrial fibrillation) (HCC)   . Skin disorder   . Systolic murmur   . Thyroid disease     Social History Social History   Substance Use Topics  . Smoking status: Never Smoker  . Smokeless tobacco: Never Used  . Alcohol use No    Family History Family History  Problem Relation Age of Onset  . Family history unknown: Yes    Past Surgical History:  Procedure Laterality Date  . ABDOMINAL HYSTERECTOMY    . AMPUTATION Right 01/05/2016   Procedure: RIGHT GREAT TOE AMPUTATION;  Surgeon: Nada LibmanVance W Brabham, MD;  [facility];  Service: Vascular;  Laterality: Right;  . CARDIAC CATHETERIZATION    . CARDIOVERSION  2012  . IR GENERIC HISTORICAL  12/30/2015   IR ANGIOGRAM EXTREMITY BILATERAL 12/30/2015 [facility]  . IR GENERIC HISTORICAL  12/30/2015   IR ANGIOGRAM SELECTIVE EACH ADDITIONAL VESSEL 12/30/2015 [facility]  . LOWER EXTREMITY ANGIOGRAM Right 12/30/2015   Procedure: RIGHT LOWER EXTREMITY ANGIOGRAM;  Surgeon: Ancil LinseyJason Evan Davis, MD;  Location: [facility];  Service: Vascular;  Laterality: Right;  accessed through left groin area - dressed with two 2x2 and 4x4 opsite  . PERIPHERAL VASCULAR CATHETERIZATION N/A 01/04/2016   Procedure: Lower Extremity Angiography;  Surgeon: Nada LibmanVance W Brabham, MD;  [facility];  Service: Cardiovascular;  Laterality: N/A;  . PERIPHERAL VASCULAR CATHETERIZATION Right 01/04/2016   Procedure: Peripheral Vascular Atherectomy;  Surgeon: Nada LibmanVance W Brabham, MD;  Location: [facility] INVASIVE CV LAB;  Service: Cardiovascular;  Laterality: Right;  Peroneal  . PERIPHERAL VASCULAR CATHETERIZATION N/A 02/01/2016   Procedure: Abdominal Aortogram w/Lower Extremity;  Surgeon: Nada LibmanVance W Brabham, MD;  Location: [facility] INVASIVE CV LAB;  Service: Cardiovascular;  Laterality: N/A;  . PERIPHERAL VASCULAR CATHETERIZATION  02/01/2016   Procedure: Peripheral Vascular Atherectomy;  Surgeon: Nada Libman, MD;  Location: [facility] INVASIVE CV LAB;  Service: Cardiovascular;;  Left   . THYROID SURGERY      No Known Allergies  No current outpatient prescriptions on file.   No current facility-administered  medications for this visit.     ROS: See HPI for pertinent positives and negatives.   Physical Examination  Vitals:   07/06/16 1300 07/06/16 1309  BP: (!) 150/61 (!) 162/62  Pulse: (!) 57 (!) 57  Resp: 16   Temp: (!) 97.1 F (36.2 C)   SpO2: 100%   Height: 5\' 5"  (1.651 m)    There is no height or weight on file to calculate BMI.  General: A&O x 3, WDWN, thin elderly female that appears younger than her stated age. Gait: not able to walk with feet ulcers, seated in w/c Eyes: PERRLA. Pulmonary: Respirations are non labored, CTAB, good air movement Cardiac: regular rhythm, bradycardic rate , no detected murmur.    Carotid Bruits Right Left   Negative Negative  Aorta is notpalpable. Radial pulses: are 2+ palpable bilaterally   VASCULAR EXAM: Extremitieswithischemic changes, withoutGangrene; withopen wounds:  Left heel ulcer measures 3.5 cm x 2.5 cm, about 3 mm depth (was 8cm x 4cm, about 4 mm depth on 04-10-16) Right heel ulcer measures 2.5 cm x 1.25 cm, about 2 mm depth (was 3cm x 3 cm, about 2-3 mm depth) Right foot lateral ulcer measures 1 cm x 1 cm about 2 mm depth (was 2cm x 2cm, about 3-4 mm depth), edges are contracting.  Thin sparse patches of non purulent yellow fibrinous tissue interspersed with red healthy appearing granulation tissue,at the base of both heel ulcers. Right great toe is surgically absent, this site is well healed.                                                                                                           LE Pulses Right Left       FEMORAL   palpable   palpable        POPLITEAL  not palpable   not palpable       POSTERIOR TIBIAL  not palpable   not palpable        DORSALIS PEDIS      ANTERIOR TIBIAL not palpable  not palpable    Abdomen: soft, NT, no palpable masses. Skin: no rashes, see Extremities.  Musculoskeletal: no muscle wasting or atrophy.  Neurologic: A&O X 3; Appropriate  Affect ; SENSATION: normal; MOTOR FUNCTION:  moving all extremities equally, motor strength 5/5 throughout. Speech is fluent/normal. CN 2-12 intact except is slightly hard of hearing.     ASSESSMENT: [name] is a 83 y.o. female who is s/p atherectomy of right peroneal artery, angioplasty of the right peroneal artery and tibioperoneal trunk on 01/04/16 and then underwent right great toe amputation on 01/05/16.   Bilateral heel and  lateral right foot ulcers are granulating and contracting slowly, see Plan.  Ulcers are probably secondary to both venous stasis and peripheral artery disease.  She has minimal edema in her lower legs and feet.  She is unable to wear shoes to walk until heel and lateral right foot ulcers heel, see Plan.    DATA  ABI (Date: 05/25/2016)  R:  ? ABI: 0.93 (0.95, calcified, 02-28-16),  ? PT: mono ? DP: mono ? TBI:  Great toe is surgically absent   L:  ? ABI: 0.83 (0.95, calcified, 02-28-16),  ? PT: mono ? DP: mono ? TBI: 0.43    Bilateral LE arterial Duplex (02-28-16): Technically difficult exam due to patient's inability to be properly positioned and calcific plaque throughout bilaterally preventing thorough visualization.  Unable to visualize a focal stenosis in the right LE, color flow disturbance noted throughout, with doubling of velocity in the distal SFA suggestive of 50-74% stenosis. Unable to visualize a focal stenosis in the left LE, color flow disturbance noted throughout with an increased velocity in the mid SFA in the 50-77% range.  All waveforms are monophasic except bilateral common femoral artery which are biphasic.   PLAN:  Daily seated leg and arm exercises as discussed with pt and son and demonstrated, NH staff to supervise this daily. Continue daily Hydrogel dressing changes to feet ulcers.  Keep pressure off heels.  Continue regular debridement of feet ulcers at the NH.  Based on the patient's vascular studies and  examination, and after discussing with Dr. Darrick Penna, pt will return to clinic in 6 weeks with bilateral feet ulcer evaluation and ABI's. Will hold off on helping pt to ambulate until then.   I discussed in depth with the patient the nature of atherosclerosis, and emphasized the importance of maximal medical management including strict control of blood pressure, blood glucose, and lipid levels, obtaining regular exercise, and continued cessation of smoking.  The patient is aware that without maximal medical management the underlying atherosclerotic disease process will progress, limiting the benefit of any interventions.  The patient was given information about PAD including signs, symptoms, treatment, what symptoms should prompt the patient to seek immediate medical care, and risk reduction measures to take.  Charisse March, RN, MSN, FNP-C [facility] [place] Phone: [phone]  Clinic MD: Darrick Penna  07/06/16 1:17 PM

## 2016-07-06 NOTE — Progress Notes (Signed)
Vitals:   07/06/16 1300  BP: (!) 150/61  Pulse: (!) 57  Resp: 16  Temp: (!) 97.1 F (36.2 C)  SpO2: 100%  Height: 5\' 5"  (1.651 m)

## 2016-07-06 NOTE — Patient Instructions (Addendum)
Peripheral Vascular Disease Peripheral vascular disease (PVD) is a disease of the blood vessels that are not part of your heart and brain. A simple term for PVD is poor circulation. In most cases, PVD narrows the blood vessels that carry blood from your heart to the rest of your body. This can result in a decreased supply of blood to your arms, legs, and internal organs, like your stomach or kidneys. However, it most often affects a person's lower legs and feet. There are two types of PVD.  Organic PVD. This is the more common type. It is caused by damage to the structure of blood vessels.  Functional PVD. This is caused by conditions that make blood vessels contract and tighten (spasm).  Without treatment, PVD tends to get worse over time. PVD can also lead to acute ischemic limb. This is when an arm or limb suddenly has trouble getting enough blood. This is a medical emergency. Follow these instructions at home:  Take medicines only as told by your doctor.  Do not use any tobacco products, including cigarettes, chewing tobacco, or electronic cigarettes. If you need help quitting, ask your doctor.  Lose weight if you are overweight, and maintain a healthy weight as told by your doctor.  Eat a diet that is low in fat and cholesterol. If you need help, ask your doctor.  Exercise regularly. Ask your doctor for some good activities for you.  Take good care of your feet. ? Wear comfortable shoes that fit well. ? Check your feet often for any cuts or sores. Contact a doctor if:  You have cramps in your legs while walking.  You have leg pain when you are at rest.  You have coldness in a leg or foot.  Your skin changes.  You are unable to get or have an erection (erectile dysfunction).  You have cuts or sores on your feet that are not healing. Get help right away if:  Your arm or leg turns cold and blue.  Your arms or legs become red, warm, swollen, painful, or numb.  You have  chest pain or trouble breathing.  You suddenly have weakness in your face, arm, or leg.  You become very confused or you cannot speak.  You suddenly have a very bad headache.  You suddenly cannot see. This information is not intended to replace advice given to you by your health care provider. Make sure you discuss any questions you have with your health care provider. Document Released: 03/15/2009 Document Revised: 05/27/2015 Document Reviewed: 05/29/2013 Elsevier Interactive Patient Education  2017 Elsevier Inc.      Venous Ulcer A venous ulcer is a shallow sore on your lower leg. It is caused by poor circulation in your veins. Venous ulcer is the most common type of lower leg ulcer. You may have venous ulcers on one leg or on both legs. This condition most often develops around your ankles. This type of ulcer may last for a long time (chronic ulcer) or it may return often (recurrent ulcer). Follow these instructions at home: Wound care  Follow instructions from your doctor about: ? How to take care of your wound. ? When and how you should change your bandage (dressing). ? When you should remove your bandage. If your bandage is dry and gets stuck to your leg when you try to remove it, moisten or wet the bandage with saline solution or water. This helps you to remove it without harming your skin or wound.  Check   your wound every day for signs of infection. Have a caregiver do this for you if you are not able to do it yourself. Watch for: ? More redness, swelling, or pain. ? More fluid or blood. ? Pus, warmth, or a bad smell. Medicines  Take over-the-counter and prescription medicines only as told by your doctor.  If you were prescribed an antibiotic medicine, take it or apply it as told by your doctor. Do not stop taking or using the antibiotic even if your condition improves. Activity  Do not stand or sit in one position for a long period of time. Rest with your legs raised  during the day. If possible, keep your legs above your heart for 30 minutes, 3-4 times a day, or as told by your doctor.  Do not sit with your legs crossed.  Walk often to increase the blood flow in your legs. Ask your doctor what level of activity is safe for you.  If you are taking a long ride in a car or plane, take a break to walk around at least once every two hours, or as told by your doctor. Ask your doctor if you should take aspirin before long trips. General instructions   Wear elastic stockings, compression stockings, or support hose as told by your doctor. This is very important.  Raise the foot of your bed as told by your doctor.  Do not smoke.  Keep all follow-up visits as told by your doctor. This is important. Contact a doctor if:  You have a fever.  Your ulcer is getting larger or is not healing.  Your pain gets worse.  You have more redness or swelling around your ulcer.  You have more fluid, blood, or pus coming from your ulcer after it has been cleaned by you or your doctor.  You have warmth or a bad smell coming from your ulcer. This information is not intended to replace advice given to you by your health care provider. Make sure you discuss any questions you have with your health care provider. Document Released: 01/27/2004 Document Revised: 05/27/2015 Document Reviewed: 04/29/2014 Elsevier Interactive Patient Education  2018 Elsevier Inc.  

## 2016-07-11 ENCOUNTER — Encounter (HOSPITAL_COMMUNITY)
Admission: RE | Admit: 2016-07-11 | Discharge: 2016-07-11 | Disposition: A | Discharge status: Home / Self Care | Payer: Medicare Other | Source: Skilled Nursing Facility | Attending: *Deleted | Admitting: *Deleted

## 2016-07-11 DIAGNOSIS — Z48812 Encounter for surgical aftercare following surgery on the circulatory system: Secondary | ICD-10-CM | POA: Diagnosis not present

## 2016-07-11 LAB — BASIC METABOLIC PANEL
Anion gap: 8 (ref 5–15)
BUN: 37 mg/dL — AB (ref 6–20)
CO2: 28 mmol/L (ref 22–32)
CREATININE: 1.35 mg/dL — AB (ref 0.44–1.00)
Calcium: 9.2 mg/dL (ref 8.9–10.3)
Chloride: 106 mmol/L (ref 101–111)
GFR calc Af Amer: 38 mL/min — ABNORMAL LOW (ref >60)
GFR calc non Af Amer: 32 mL/min — ABNORMAL LOW (ref >60)
Glucose, Bld: 79 mg/dL (ref 65–99)
Potassium: 3.9 mmol/L (ref 3.5–5.1)
SODIUM: 142 mmol/L (ref 135–145)

## 2016-07-12 NOTE — Addendum Note (Signed)
Addended by: Burton ApleyPETTY, Livianna Petraglia A on: 07/12/2016 10:17 AM   Modules accepted: Orders

## 2016-07-17 ENCOUNTER — Non-Acute Institutional Stay (SKILLED_NURSING_FACILITY): Payer: Medicare Other | Admitting: Internal Medicine

## 2016-07-17 ENCOUNTER — Encounter: Payer: Self-pay | Admitting: Internal Medicine

## 2016-07-17 DIAGNOSIS — I4891 Unspecified atrial fibrillation: Secondary | ICD-10-CM | POA: Diagnosis not present

## 2016-07-17 DIAGNOSIS — N183 Chronic kidney disease, stage 3 unspecified: Secondary | ICD-10-CM

## 2016-07-17 DIAGNOSIS — E039 Hypothyroidism, unspecified: Secondary | ICD-10-CM | POA: Diagnosis not present

## 2016-07-17 DIAGNOSIS — E7800 Pure hypercholesterolemia, unspecified: Secondary | ICD-10-CM | POA: Diagnosis not present

## 2016-07-17 DIAGNOSIS — I2510 Atherosclerotic heart disease of native coronary artery without angina pectoris: Secondary | ICD-10-CM

## 2016-07-17 DIAGNOSIS — I10 Essential (primary) hypertension: Secondary | ICD-10-CM | POA: Diagnosis not present

## 2016-07-17 NOTE — Progress Notes (Signed)
Location:   [facility] Room Number: 111/W Place of Service:  SNF [phone]) Provider:  Fanny Dance, MD  Patient Care Team: Assunta Found, MD as PCP - General (Family Medicine) Wyline Mood [name], MD as Consulting Physician (Cardiology) Cliffton Asters, MD as Consulting Physician (Infectious Diseases) [facility] Facility)  Extended Emergency Contact Information Primary Emergency Contact: Casimer Lanius [place] Home Phone: [phone] Mobile Phone: [phone] Relation: None Secondary Emergency Contact: Margarito Courser [place] Home Phone: [phone] Relation: None  Code Status:  DNR Goals of care: Advanced Directive information Advanced Directives 07/17/2016  Does Patient Have a Medical Advance Directive? Yes  Type of Advance Directive Out of facility DNR (pink MOST or yellow form)  Does patient want to make changes to medical advance directive? No - Patient declined  Copy of Healthcare Power of Attorney in Chart? -  Would patient like information on creating a medical advance directive? No - Patient declined     Chief Complaint  Patient presents with  . Acute Visit    Check ears for wax    HPI:  Pt is a 83 y.o. female seen today for an acute visit for    Past Medical History:  Diagnosis Date  . Acid reflux   . Carotid artery occlusion    minimal  . Coronary artery disease   . Hypercholesteremia   . Hypertension   . Myocardial infarct (HCC)   . PAF (paroxysmal atrial fibrillation) (HCC)   . Skin disorder   . Systolic murmur   . Thyroid disease    Past Surgical History:  Procedure Laterality Date  . ABDOMINAL HYSTERECTOMY    . AMPUTATION Right 01/05/2016   Procedure: RIGHT GREAT TOE AMPUTATION;  Surgeon: Nada Libman, MD;  Location: [facility] OR;  Service: Vascular;  Laterality: Right;  . CARDIAC CATHETERIZATION    . CARDIOVERSION  2012  . IR GENERIC HISTORICAL   12/30/2015   IR ANGIOGRAM EXTREMITY BILATERAL 12/30/2015 [facility]  . IR GENERIC HISTORICAL  12/30/2015   IR ANGIOGRAM SELECTIVE EACH ADDITIONAL VESSEL 12/30/2015 [facility]  . LOWER EXTREMITY ANGIOGRAM Right 12/30/2015   Procedure: RIGHT LOWER EXTREMITY ANGIOGRAM;  Surgeon: Ancil Linsey, MD;  Location: [facility];  Service: Vascular;  Laterality: Right;  accessed through left groin area - dressed with two 2x2 and 4x4 opsite  . PERIPHERAL VASCULAR CATHETERIZATION N/A 01/04/2016   Procedure: Lower Extremity Angiography;  Surgeon: Nada Libman, MD;  Location: [facility] INVASIVE CV LAB;  Service: Cardiovascular;  Laterality: N/A;  . PERIPHERAL VASCULAR CATHETERIZATION Right 01/04/2016   Procedure: Peripheral Vascular Atherectomy;  Surgeon: Nada Libman, MD;  Location: [facility] INVAS[facility] CV LAB;  Service: Cardiovascular;  Laterality: Right;  Peroneal  . PERIPHERAL VASCULAR CATHETERIZATION N/A 02/01/2016   Procedure: Abdominal Aortogram w/Lower Extremity;  Surgeon: Nada Libman, MD;  Location: [facility] INVASIVE CV LAB;  Service: Cardiovascular;  Laterality: N/A;  . PERIPHERAL VASCULAR CATHETERIZATION  02/01/2016   Procedure: Peripheral Vascular Atherectomy;  Surgeon: Nada Libman, MD;  Location: [facility] INVASIVE CV LAB;  Service: Cardiovascular;;  Left   . THYROID SURGERY      No Known Allergies  Outpatient Encounter Prescriptions as of 07/17/2016  Medication Sig  . acetaminophen (TYLENOL) 325 MG tablet Take 650 mg by mouth every 4 (four) hours as needed.   Marland Kitchen amiodarone (PACERONE) 200 MG tablet Take 0.5 tablets (100 mg total) by mouth daily.  Marland Kitchen amLODipine (NORVASC) 10 MG  tablet Take 10 mg by mouth daily.  Marland Kitchen aspirin EC 81 MG tablet Take 1 tablet (81 mg total) by mouth daily.  Marland Kitchen atorvastatin (LIPITOR) 20 MG tablet Take 20 mg by mouth every other day. Take 1 tablet (20 mg) by mouth every other night  . docusate sodium (COLACE) 100 MG capsule Take 100 mg by mouth daily.  . ferrous sulfate (QC FERROUS  SULFATE) 325 (65 FE) MG tablet Take 325 mg by mouth daily with breakfast.  . furosemide (LASIX) 20 MG tablet Give 1 tablet by mouth daily and prn as needed  . levothyroxine (SYNTHROID, LEVOTHROID) 75 MCG tablet Take 75 mcg by mouth daily.    . magnesium hydroxide (MILK OF MAGNESIA) 400 MG/5ML suspension Take 30 mLs by mouth daily as needed for mild constipation.  Marland Kitchen omeprazole (PRILOSEC) 40 MG capsule Take 40 mg by mouth at bedtime.  . senna (SENOKOT) 8.6 MG TABS tablet Take 2 tablets by mouth daily.   . vitamin B-12 (CYANOCOBALAMIN) 1000 MCG tablet Take 1,000 mcg by mouth daily.  . [DISCONTINUED] Dextromethorphan-Guaifenesin (ROBITUSSIN DM PO) Take 10 mLs by mouth every 6 (six) hours as needed.   No facility-administered encounter medications on file as of 07/17/2016.      Review of Systems   There is no immunization history on file for this patient. Pertinent  Health Maintenance Due  Topic Date Due  . DEXA SCAN  02/09/1987  . INFLUENZA VACCINE  12/02/2016 (Originally 08/02/2016)  . PNA vac Low Risk Adult (1 of 2 - PCV13) 12/02/2016 (Originally 02/09/1987)   No flowsheet data found. Functional Status Survey:    Vitals:   07/17/16 0953  BP: (!) 153/64  Pulse: (!) 53  Resp: 18  Temp: 98.4 F (36.9 C)  TempSrc: Oral  SpO2: 95%   There is no height or weight on file to calculate BMI. Physical Exam  Labs reviewed:  Recent Labs  05/12/16 0700  07/03/16 0500 07/04/16 0500 07/11/16 0800  NA 140  < > 139 142 142  K 3.9  < > 4.2 3.7 3.9  CL 103  < > 104 106 106  CO2 29  < > 26 27 28   GLUCOSE 90  < > 73 71 79  BUN 33*  < > 38* 37* 37*  CREATININE 1.30*  < > 1.34* 1.33* 1.35*  CALCIUM 8.9  < > 8.5* 8.7* 9.2  MG 2.1  --   --   --   --   < > = values in this interval not displayed.  Recent Labs  01/17/16 1718 03/07/16 0600 05/12/16 0700  AST 22 13* 19  ALT <5* 10* 15  ALKPHOS 95 78 97  BILITOT 0.5 0.5 0.3  PROT 7.0 6.5 7.3  ALBUMIN 2.9* 2.9* 3.6    Recent Labs   04/10/16 0700 05/12/16 0700 07/03/16 0500  WBC 4.1 7.4 4.6  NEUTROABS 2.5 5.0 2.3  HGB 9.9* 11.6* 11.3*  HCT 31.7* 36.8 36.5  MCV 86.4 82.9 83.9  PLT 317 327 306   Lab Results  Component Value Date   TSH 2.487 03/07/2016   No results found for: HGBA1C Lab Results  Component Value Date   CHOL 105 03/07/2016   HDL 38 (L) 03/07/2016   LDLCALC 57 03/07/2016   TRIG 50 03/07/2016   CHOLHDL 2.8 03/07/2016    Significant Diagnostic Results in last 30 days:  No results found.  Assessment/Plan There are no diagnoses linked to this encounter.   Family/ staff Communication:  Labs/tests ordered:     This encounter was created in error - please disregard.

## 2016-07-17 NOTE — Progress Notes (Signed)
Location:   [facility] Room Number: 111/W Place of Service:  SNF [phone]) Provider:  Andrew Au, MD  Patient Care Team: Assunta Found, MD as PCP - General (Family Medicine) Wyline Mood [name], MD as Consulting Physician (Cardiology) Cliffton Asters, MD as Consulting Physician (Infectious Diseases) [facility])  Extended Emergency Contact Information Primary Emergency Contact: Casimer Lanius [place] Home Phone: [phone] Mobile Phone: [phone] Relation: None Secondary Emergency Contact: Margarito Courser [place] Home Phone: [phone] Relation: None  Code Status:  DNR Goals of care: Advanced Directive information Advanced Directives 07/17/2016  Does Patient Have a Medical Advance Directive? Yes  Type of Advance Directive Out of facility DNR (pink MOST or yellow form)  Does patient want to make changes to medical advance directive? No - Patient declined  Copy of Healthcare Power of Attorney in Chart? -  Would patient like information on creating a medical advance directive? No - Patient declined     Chief Complaint  Patient presents with  . Medical Management of Chronic Issues    Routine Visit  . Acute Visit    Check ears for wax  Medical management of chronic medical conditions including atrial fibrillation-coronary artery disease bilateral heel ulcers-chronic renal insufficiency-grade 2 diastolic CHF-hypertension-hyperlipidemia-hyperthyroidism.    HPI:  Pt is a 83 y.o. female seen today for an acute visit for for follow-up of possible ear wax-also management of chronic medical conditions as noted above.  Most recent acute issue was somewhat elevated creatinine mid ones was higher than her baseline her Lasix was held for a couple days and creatinine appears to be back more at her baseline at 1.35 with a BUN of 37-she is on Lasix with a history of diastolic CHF  her weight has been stable at about 120 pounds edema appears to be relatively stable as well.  She does have a history of bilateral heel ulcers is followed by vascular she did see them earlier this month with recommendation to continue hydrogel to the heels bilaterally apparently they will do ABIs some point in future for a follow-up visit with my understanding-thought to be stable however currently her heels are wrapped.  She does have a history of coronary artery disease on medical management including aspirin and statin this appears to be stable and asymptomatic.  In regards to atrial fibrillation she is on aspirin she is on amiodarone for rate control pulses often 50s but she is asymptomatic of any significant bradycardia.  Regards to hypertension she is on Norvasc as well as the Lasix I got a manual reading of 120 systolically over 62 diastolic today she appears to have variable readings per review of chart 156/[id number]/63-136/50 at this point will monitor it seems the manual readings tend to be a bit lower versus the machine readings.  Regards hyperlipidemia this appears under decent control with an LDL of 77 on lab done back in March.  She also has a history of hypothyroidism TSH was normal back in March of 2.487.  Her son thinks she may have some earwax since apparently she has increased difficulty hearing especially over the phone-I did evaluate this and she does have a fairly significant amount of wax in both ears-she says she is not having any difficulty hearing   Of note she does have a history of vascular issuesShe iss/p atherectomy right peroneal artery, angioplasty of the right peroneal artery and tibioperoneal trunk on 01/04/16 and then underwent right  great toe amputation on 01/05/16 by Dr. Myra Gianotti      Past Medical History:  Diagnosis Date  . Acid reflux   . Carotid artery occlusion    minimal  . Coronary artery disease   . Hypercholesteremia   . Hypertension   . Myocardial  infarct (HCC)   . PAF (paroxysmal atrial fibrillation) (HCC)   . Skin disorder   . Systolic murmur   . Thyroid disease    Past Surgical History:  Procedure Laterality Date  . ABDOMINAL HYSTERECTOMY    . AMPUTATION Right 01/05/2016   Procedure: RIGHT GREAT TOE AMPUTATION;  Surgeon: Nada Libman, MD;  Location: [facility] OR;  Service: Vascular;  Laterality: Right;  . CARDIAC CATHETERIZATION    . CARDIOVERSION  2012  . IR GENERIC HISTORICAL  12/30/2015   IR ANGIOGRAM EXTREMITY BILATERAL 12/30/2015 [facility]  . IR GENERIC HISTORICAL  12/30/2015   IR ANGIOGRAM SELECTIVE EACH ADDITIONAL VESSEL 12/30/2015 [facility]  . LOWER EXTREMITY ANGIOGRAM Right 12/30/2015   Procedure: RIGHT LOWER EXTREMITY ANGIOGRAM;  Surgeon: Ancil Linsey, MD;  Location: [facility];  Service: Vascular;  Laterality: Right;  accessed through left groin area - dressed with two 2x2 and 4x4 opsite  . PERIPHERAL VASCULAR CATHETERIZATION N/A 01/04/2016   Procedure: Lower Extremity Angiography;  Surgeon: Nada Libman, MD;  Location: [facility] INVASIVE CV LAB;  Service: Cardiovascular;  Laterality: N/A;  . PERIPHERAL VASCULAR CATHETERIZATION Right 01/04/2016   Procedure: Peripheral Vascular Atherectomy;  Surgeon: Nada Libman, MD;  Location: [facility] INVASIVE CV LAB;  Service: Cardiovascular;  Laterality: Right;  Peroneal  . PERIPHERAL VASCULAR CATHETERIZATION N/A 02/01/2016   Procedure: Abdominal Aortogram w/Lower Extremity;  Surgeon: Nada Libman, MD;  Location: [facility] INVASIVE CV LAB;  Service: Cardiovascular;  Laterality: N/A;  . PERIPHERAL VASCULAR CATHETERIZATION  02/01/2016   Procedure: Peripheral Vascular Atherectomy;  Surgeon: Nada Libman, MD;  Location: [facility] INVASIVE CV LAB;  Service: Cardiovascular;;  Left   . THYROID SURGERY      No Known Allergies  Outpatient Encounter Prescriptions as of 07/17/2016  Medication Sig  . acetaminophen (TYLENOL) 325 MG tablet Take 650 mg by mouth every 4 (four) hours as needed.   Marland Kitchen  amiodarone (PACERONE) 200 MG tablet Take 0.5 tablets (100 mg total) by mouth daily.  Marland Kitchen amLODipine (NORVASC) 10 MG tablet Take 10 mg by mouth daily.  Marland Kitchen aspirin EC 81 MG tablet Take 1 tablet (81 mg total) by mouth daily.  Marland Kitchen atorvastatin (LIPITOR) 20 MG tablet Take 20 mg by mouth every other day. Take 1 tablet (20 mg) by mouth every other night  . docusate sodium (COLACE) 100 MG capsule Take 100 mg by mouth daily.  . ferrous sulfate (QC FERROUS SULFATE) 325 (65 FE) MG tablet Take 325 mg by mouth daily with breakfast.  . furosemide (LASIX) 20 MG tablet Give 1 tablet by mouth daily and prn as needed  . levothyroxine (SYNTHROID, LEVOTHROID) 75 MCG tablet Take 75 mcg by mouth daily.    . magnesium hydroxide (MILK OF MAGNESIA) 400 MG/5ML suspension Take 30 mLs by mouth daily as needed for mild constipation.  Marland Kitchen omeprazole (PRILOSEC) 40 MG capsule Take 40 mg by mouth at bedtime.  . senna (SENOKOT) 8.6 MG TABS tablet Take 2 tablets by mouth daily.   . vitamin B-12 (CYANOCOBALAMIN) 1000 MCG tablet Take 1,000 mcg by mouth daily.   No facility-administered encounter medications on file as of 07/17/2016.     Review of Systems In general does  not complaining of any fever or chills her weight appears to be stable at around 20 pounds  Skin does have a history of nonhealing ulcers in her feet bilaterally is followed by vascular surgery reason apparently are improving per review vascular note Head ears nose mouth and throat is not complaining of any sore throat or eye drainage or visual changes.--Her son has noticed some increased difficulty hearing  Respiratory denies shortness of breath or increased cough  Cardiac does not complaining of chest pain edema appears to be quite stable for some time.  GI is not complaining of any nausea vomiting diarrhea constipation or abdominal discomfort.  GU-does not complain of any dysuria  Musculoskeletal not currently complaining of joint pain.  Neurologic does  not complain of dizziness or headache  Psych continues to be in good spirits does not complain of depression or anxiety nursing staff has not noted any recent acute issues in this regard   There is no immunization history on file for this patient.  There is no immunization history on file for this patient. Pertinent  Health Maintenance Due  Topic Date Due  . DEXA SCAN  02/09/1987  . INFLUENZA VACCINE  12/02/2016 (Originally 08/02/2016)  . PNA vac Low Risk Adult (1 of 2 - PCV13) 12/02/2016 (Originally 02/09/1987)   No flowsheet data found. Functional Status Survey:    Vitals:   07/17/16 1050  BP: (!) 153/64  Pulse: (!) 53  Resp: 18  Temp: 98.4 F (36.9 C)  TempSrc: Oral  SpO2: 95%  Of note manual reading taken today was 120/62 There is no height or weight on file to calculate BMI. Physical Exam In general this is a well-nourished elderly female in no distress sitting comfortably in her wheelchair.  Her skin is warm and dry she does have the bilateral heel ulcers followed by wound care and vascular surgery. These areas are currently covered  Eyes sclera and conjunctiva are clear visual acuity appears grossly intact.--She does have prescription lenses  Ears-I do note a fairly significant amount of wax in both ear canals tympanic membrane is obscured bilaterally-I do not see any erythema drainage  Oropharynx clear mucous membranes moist I did not note any drainage.  Heart is  regular irregular rate and rhythm she has mild lower extremity edema appears to be baseline.  Abdomen is soft nontender with positive bowel sounds.  Musculoskeletal is able to move all extremities 4 ambulates well in a wheelchair baseline exam.  does have wrapping of her heels bilaterally--she is status post right great toe amputation--she is status right great toe amputation  Neurologic is grossly intact her speech is clear no lateralizing findings.  Psych she is alert and oriented pleasant  and appropriate  Labs reviewed:  Recent Labs  05/12/16 0700  07/03/16 0500 07/04/16 0500 07/11/16 0800  NA 140  < > 139 142 142  K 3.9  < > 4.2 3.7 3.9  CL 103  < > 104 106 106  CO2 29  < > 26 27 28   GLUCOSE 90  < > 73 71 79  BUN 33*  < > 38* 37* 37*  CREATININE 1.30*  < > 1.34* 1.33* 1.35*  CALCIUM 8.9  < > 8.5* 8.7* 9.2  MG 2.1  --   --   --   --   < > = values in this interval not displayed.  Recent Labs  01/17/16 1718 03/07/16 0600 05/12/16 0700  AST 22 13* 19  ALT <5* 10* 15  ALKPHOS 95 78 97  BILITOT 0.5 0.5 0.3  PROT 7.0 6.5 7.3  ALBUMIN 2.9* 2.9* 3.6    Recent Labs  04/10/16 0700 05/12/16 0700 07/03/16 0500  WBC 4.1 7.4 4.6  NEUTROABS 2.5 5.0 2.3  HGB 9.9* 11.6* 11.3*  HCT 31.7* 36.8 36.5  MCV 86.4 82.9 83.9  PLT 317 327 306   Lab Results  Component Value Date   TSH 2.487 03/07/2016   No results found for: HGBA1C Lab Results  Component Value Date   CHOL 105 03/07/2016   HDL 38 (L) 03/07/2016   LDLCALC 57 03/07/2016   TRIG 50 03/07/2016   CHOLHDL 2.8 03/07/2016    Significant Diagnostic Results in last 30 days:  No results found.  Assessment/Plan  #1-wax impaction-will prescribe Debrox 10 drops twice a day to both ears 3 days and flush with warm water on day 4 monitor  #2 history of atrial fibrillation this appears rate controlled on amiodarone continues on aspirin for anticoagulation.  #3 history of bilateral heel ulcers this is followed closely by vascular surgery as well as wound care and appears these have gradually improved-.  #4-history of hypertension she has variable systolics here per chart review manual reading today was reassuring but will have to keep an eye on this she continues on Norvasc as well as low-dose Lasix.  #5 history of diastolic CHF grade 2 she is on low-dose Lasix weight appears to be fairly stable as well as edema she is relatively asymptomatic will monitor.  #6-history of chronic renal insufficiency  creatinine appears relatively baseline on lab done July 10 we'll update this later this week to ensure stability now that her Lasix as been restarted it was held for a couple days late last month because of elevated creatinine.  #7 history of hyperlipidemia LDL was 57 on lab in March desired is on a statin.  Number 8- a history of hypothyroidism this appears stable as well with a TSH of 2..4 87 on lab done in March.  #9 history of anemia hemoglobin appears stable at 11.3 on lab done earlier this month she is on iron.  #10 history of B12 deficiency continues on supplementation B12 level with stable back in May.  Again will update a BMP keep an eye on her renal issues recent CBC shows stability of hemoglobin-TSH and LDL also were reassuring  Also again will give a short course of Debroxsecondary to wax impaction of both ears  [id number]-of note greater than 40 minutes spent assessing patient-reviewing her chart-reviewing her labs-and coordinating and formulating a plan of care for numerous diagnoses-note greater than 50% of time spent coordinating plan of care

## 2016-07-18 ENCOUNTER — Encounter (HOSPITAL_COMMUNITY)
Admission: RE | Admit: 2016-07-18 | Discharge: 2016-07-18 | Disposition: A | Discharge status: Home / Self Care | Payer: Medicare Other | Source: Skilled Nursing Facility | Attending: Internal Medicine | Admitting: Internal Medicine

## 2016-07-18 DIAGNOSIS — I5022 Chronic systolic (congestive) heart failure: Secondary | ICD-10-CM | POA: Insufficient documentation

## 2016-07-18 DIAGNOSIS — K219 Gastro-esophageal reflux disease without esophagitis: Secondary | ICD-10-CM | POA: Diagnosis present

## 2016-07-18 DIAGNOSIS — I739 Peripheral vascular disease, unspecified: Secondary | ICD-10-CM | POA: Insufficient documentation

## 2016-07-18 DIAGNOSIS — Z48812 Encounter for surgical aftercare following surgery on the circulatory system: Secondary | ICD-10-CM | POA: Diagnosis not present

## 2016-07-18 LAB — BASIC METABOLIC PANEL
Anion gap: 9 (ref 5–15)
BUN: 37 mg/dL — AB (ref 6–20)
CALCIUM: 8.7 mg/dL — AB (ref 8.9–10.3)
CHLORIDE: 103 mmol/L (ref 101–111)
CO2: 28 mmol/L (ref 22–32)
CREATININE: 1.43 mg/dL — AB (ref 0.44–1.00)
GFR calc Af Amer: 35 mL/min — ABNORMAL LOW (ref >60)
GFR calc non Af Amer: 30 mL/min — ABNORMAL LOW (ref >60)
Glucose, Bld: 84 mg/dL (ref 65–99)
Potassium: 3.7 mmol/L (ref 3.5–5.1)
SODIUM: 140 mmol/L (ref 135–145)

## 2016-08-08 ENCOUNTER — Encounter: Payer: Self-pay | Admitting: Family

## 2016-08-17 ENCOUNTER — Encounter: Payer: Self-pay | Admitting: Family

## 2016-08-17 ENCOUNTER — Ambulatory Visit (HOSPITAL_COMMUNITY)
Admission: RE | Admit: 2016-08-17 | Discharge: 2016-08-17 | Disposition: A | Discharge status: Home / Self Care | Payer: Medicare Other | Source: Ambulatory Visit | Attending: Family | Admitting: Family

## 2016-08-17 ENCOUNTER — Ambulatory Visit (INDEPENDENT_AMBULATORY_CARE_PROVIDER_SITE_OTHER): Payer: Medicare Other | Admitting: Family

## 2016-08-17 VITALS — BP 147/71 | HR 64 | Temp 97.0°F | Resp 16 | Ht 65.0 in | Wt 119.0 lb

## 2016-08-17 DIAGNOSIS — Z89411 Acquired absence of right great toe: Secondary | ICD-10-CM

## 2016-08-17 DIAGNOSIS — I779 Disorder of arteries and arterioles, unspecified: Secondary | ICD-10-CM | POA: Diagnosis present

## 2016-08-17 DIAGNOSIS — L97402 Non-pressure chronic ulcer of unspecified heel and midfoot with fat layer exposed: Secondary | ICD-10-CM

## 2016-08-17 DIAGNOSIS — Z9862 Peripheral vascular angioplasty status: Secondary | ICD-10-CM | POA: Diagnosis not present

## 2016-08-17 DIAGNOSIS — I83004 Varicose veins of unspecified lower extremity with ulcer of heel and midfoot: Secondary | ICD-10-CM

## 2016-08-17 NOTE — Progress Notes (Signed)
[facility] [place]   CC: Follow up peripheral artery occlusive disease  History of Present Illness [name] is a 83 y.o. female who is s/p atherectomy right peroneal artery, angioplasty of the right peroneal artery and tibioperoneal trunk on 01/04/16 and then underwent right great toe amputation on 01/05/16 by Dr. Myra Gianotti.  I last evaluated pt on 07-06-16. At that time bilateral heel and lateral right foot ulcers were granulating and contractingslowly. Ulcers are probably secondary to both venous stasis and peripheral artery disease.  She had minimal edema in her lower legs and feet.  Daily seated leg and arm exercisesas discussed with pt and son and demonstrated, NH staff to supervise this daily. Stop Santyl; start daily Hydrogel dressing changes to feet ulcers.   She is residing at [facility].She states that she is walking with a walker minimally as she needs to keep pressure off of her heels and feet, therefore cannot wear shoes, but over alldoing well. Physical therapy is supervising her walking.  She seems to be doing daily seated leg exercises as advised.  Pt denies fever or chills, denies pain.   Pt Diabetic: No Pt smoker: non-smoker  Pt meds include: Statin :Yes Betablocker: No ASA: Yes Other anticoagulants/antiplatelets: no      Past Medical History:  Diagnosis Date  . Acid reflux   . Carotid artery occlusion    minimal  . Coronary artery disease   . Hypercholesteremia   . Hypertension   . Myocardial infarct (HCC)   . PAF (paroxysmal atrial fibrillation) (HCC)   . Skin disorder   . Systolic murmur   . Thyroid disease     Social History Social History  Substance Use Topics  . Smoking status: Never Smoker  . Smokeless tobacco: Never Used  . Alcohol use No    Family History Family History  Problem Relation Age of Onset  . Family history unknown: Yes    Past Surgical History:  Procedure Laterality Date  .  ABDOMINAL HYSTERECTOMY    . AMPUTATION Right 01/05/2016   Procedure: RIGHT GREAT TOE AMPUTATION;  Surgeon: Nada Libman, MD;  Location: [facility] OR;  Service: Vascular;  Laterality: Right;  . CARDIAC CATHETERIZATION    . CARDIOVERSION  2012  . IR GENERIC HISTORICAL  12/30/2015   IR ANGIOGRAM EXTREMITY BILATERAL 12/30/2015 [facility]  . IR GENERIC HISTORICAL  12/30/2015   IR ANGIOGRAM SELECTIVE EACH ADDITIONAL VESSEL 12/30/2015 [facility]  . LOWER EXTREMITY ANGIOGRAM Right 12/30/2015   Procedure: RIGHT LOWER EXTREMITY ANGIOGRAM;  Surgeon: Ancil Linsey, MD;  Location: [facility];  Service: Vascular;  Laterality: Right;  accessed through left groin area - dressed with two 2x2 and 4x4 opsite  . PERIPHERAL VASCULAR CATHETERIZATION N/A 01/04/2016   Procedure: Lower Extremity Angiography;  Surgeon: Nada Libman, MD;  Location: [facility] INVASIVE CV LAB;  Service: Cardiovascular;  Laterality: N/A;  . PERIPHERAL VASCULAR CATHETERIZATION Right 01/04/2016   Procedure: Peripheral Vascular Atherectomy;  Surgeon: Nada Libman, MD;  Location: [facility] INVASIVE CV LAB;  Service: Cardiovascular;  Laterality: Right;  Peroneal  . PERIPHERAL VASCULAR CATHETERIZATION N/A 02/01/2016   Procedure: Abdominal Aortogram w/Lower Extremity;  Surgeon: Nada Libman, MD;  Location: [facility] INVASIVE CV LAB;  Service: Cardiovascular;  Laterality: N/A;  . PERIPHERAL VASCULAR CATHETERIZATION  02/01/2016   Procedure: Peripheral Vascular Atherectomy;  Surgeon: Nada Libman, MD;  Location: [facility] INVASIVE CV LAB;  Service: Cardiovascular;;  Left   . THYROID SURGERY  No Known Allergies  No current outpatient prescriptions on file.   No current facility-administered medications for this visit.     ROS: See HPI for pertinent positives and negatives.   Physical Examination  Vitals:   08/17/16 1049  BP: (!) 159/66  Pulse: 64  Resp: 16  Temp: (!) 97 F (36.1 C)  TempSrc: Oral  SpO2: 99%  Weight: 119 lb (54 kg)  Height:  5\' 5"  (1.651 m)   Body mass index is 19.8 kg/m.  General: A&O x 3, WDWN, thin elderly female that appears younger than her stated age. Gait: not able to walk with feet ulcers, seated in w/c Eyes: PERRLA. Pulmonary: Respirations are non labored, CTAB, good air movement Cardiac: regular rhythm, bradycardic rate , no detected murmur.    Carotid Bruits Right Left   Negative Negative  Aorta is notpalpable. Radial pulses: are 2+ palpable bilaterally   VASCULAR EXAM: Extremitieswithischemic changes, withoutGangrene; withopen wounds:   Left posterior heel ulcer    Right foot, lateral ulcer    Right foot, lateral ulcer    Left heel ulcer measures 3 cm x 2 cm, (on 07-06-16 was 3.5 cm x 2.5 cm), about 3 mm depth (was 8cm x 4cm, about 4 mm depth on 04-10-16), edges are contracting.  Right posterior heel ulcer has healed. Right foot lateral ulcer measures 1 cm x 1 cm about 2 mm depth (was 2cm x 2cm, about 3-4 mm depth), edges are contracting.  Red healthy appearing granulation tissue at the base of both heel ulcers. Right great toe is surgically absent, this site is well healed.                                                                                                                                                        LE Pulses Right Left       FEMORAL   palpable   palpable        POPLITEAL  not palpable   not palpable       POSTERIOR TIBIAL  not palpable   not palpable        DORSALIS PEDIS      ANTERIOR TIBIAL not palpable  not palpable    Abdomen: soft, NT, no palpable masses. Skin: no rashes, see Extremities.  Musculoskeletal: no muscle wasting or atrophy.      Neurologic: A&O X 3; Appropriate Affect ; SENSATION: normal; MOTOR FUNCTION:  moving all extremities equally, motor strength 4/5 throughout. Speech is fluent/normal. CN 2-12 intact except is slightly hard of hearing      ASSESSMENT: [name] is  a 83 y.o. female who is s/p atherectomy of right peroneal artery, angioplasty of the right peroneal artery and tibioperoneal trunk on 01/04/16 and then underwent right great toe amputation on 01/05/16.   Bilateral heel and  lateral right foot ulcers are granulating and contractingslowly, see Plan. The right posterior heel ulcer has heeled.  Ulcers are probably secondary to both venous stasis and peripheral artery disease.  She has no edema in her lower legs and feet.   Her feet ulcer are slowly heeling, this will continue to be a slow process. Walking with physical therapy is encouraged.    DATA  ABI(Date: 05/25/2016)  R:  ? ABI: 0.93(0.95, calcified, 02-28-16),  ? PT: mono ? DP: mono ? TBI: Great toe is surgically absent   L:  ? ABI: 0.83(0.95, calcified, 02-28-16),  ? PT: mono ? DP: mono ? TBI: 0.43    Bilateral LE arterial Duplex (02-28-16): Technically difficult exam due to patient's inability to be properly positioned and calcific plaque throughout bilaterally preventing thorough visualization.  Unable to visualize a focal stenosis in the right LE, color flow disturbance noted throughout, with doubling of velocity in the distal SFA suggestive of 50-74% stenosis. Unable to visualize a focal stenosis in the left LE, color flow disturbance noted throughout with an increased velocity in the mid SFA in the 50-77% range.  All waveforms are monophasic except bilateral common femoral artery which are biphasic.    PLAN:  Daily seated leg and arm exercisesas discussed with pt and son and demonstrated, NH staff to supervise this daily. Continue daily Hydrogel dressing changes to feet ulcers.  Keep pressure off heels.  Continue regular debridement of feet ulcers at the NH.  Based on the patient's vascular studies and examination, pt will return to clinic in 3 monthswithbilateral feet ulcer evaluation and ABI's. May ambulate with physical therapy.   I discussed in depth  with the patient the nature of atherosclerosis, and emphasized the importance of maximal medical management including strict control of blood pressure, blood glucose, and lipid levels, obtaining regular exercise, and continued cessation of smoking.  The patient is aware that without maximal medical management the underlying atherosclerotic disease process will progress, limiting the benefit of any interventions.  The patient was given information about PAD including signs, symptoms, treatment, what symptoms should prompt the patient to seek immediate medical care, and risk reduction measures to take.  Charisse MarchSuzanne Amberlea Spagnuolo, RN, MSN, FNP-C [facility] [place] Office Phone: [phone]  [facility]inic MD: [facility]  08/17/16 10:51 AM

## 2016-08-17 NOTE — Patient Instructions (Signed)
Peripheral Vascular Disease Peripheral vascular disease (PVD) is a disease of the blood vessels that are not part of your heart and brain. A simple term for PVD is poor circulation. In most cases, PVD narrows the blood vessels that carry blood from your heart to the rest of your body. This can result in a decreased supply of blood to your arms, legs, and internal organs, like your stomach or kidneys. However, it most often affects a person's lower legs and feet. There are two types of PVD.  Organic PVD. This is the more common type. It is caused by damage to the structure of blood vessels.  Functional PVD. This is caused by conditions that make blood vessels contract and tighten (spasm).  Without treatment, PVD tends to get worse over time. PVD can also lead to acute ischemic limb. This is when an arm or limb suddenly has trouble getting enough blood. This is a medical emergency. Follow these instructions at home:  Take medicines only as told by your doctor.  Do not use any tobacco products, including cigarettes, chewing tobacco, or electronic cigarettes. If you need help quitting, ask your doctor.  Lose weight if you are overweight, and maintain a healthy weight as told by your doctor.  Eat a diet that is low in fat and cholesterol. If you need help, ask your doctor.  Exercise regularly. Ask your doctor for some good activities for you.  Take good care of your feet. ? Wear comfortable shoes that fit well. ? Check your feet often for any cuts or sores. Contact a doctor if:  You have cramps in your legs while walking.  You have leg pain when you are at rest.  You have coldness in a leg or foot.  Your skin changes.  You are unable to get or have an erection (erectile dysfunction).  You have cuts or sores on your feet that are not healing. Get help right away if:  Your arm or leg turns cold and blue.  Your arms or legs become red, warm, swollen, painful, or numb.  You have  chest pain or trouble breathing.  You suddenly have weakness in your face, arm, or leg.  You become very confused or you cannot speak.  You suddenly have a very bad headache.  You suddenly cannot see. This information is not intended to replace advice given to you by your health care provider. Make sure you discuss any questions you have with your health care provider. Document Released: 03/15/2009 Document Revised: 05/27/2015 Document Reviewed: 05/29/2013 Elsevier Interactive Patient Education  2017 Elsevier Inc.      Venous Ulcer A venous ulcer is a shallow sore on your lower leg. It is caused by poor circulation in your veins. Venous ulcer is the most common type of lower leg ulcer. You may have venous ulcers on one leg or on both legs. This condition most often develops around your ankles. This type of ulcer may last for a long time (chronic ulcer) or it may return often (recurrent ulcer). Follow these instructions at home: Wound care  Follow instructions from your doctor about: ? How to take care of your wound. ? When and how you should change your bandage (dressing). ? When you should remove your bandage. If your bandage is dry and gets stuck to your leg when you try to remove it, moisten or wet the bandage with saline solution or water. This helps you to remove it without harming your skin or wound.  Check  your wound every day for signs of infection. Have a caregiver do this for you if you are not able to do it yourself. Watch for: ? More redness, swelling, or pain. ? More fluid or blood. ? Pus, warmth, or a bad smell. Medicines  Take over-the-counter and prescription medicines only as told by your doctor.  If you were prescribed an antibiotic medicine, take it or apply it as told by your doctor. Do not stop taking or using the antibiotic even if your condition improves. Activity  Do not stand or sit in one position for a long period of time. Rest with your legs raised  during the day. If possible, keep your legs above your heart for 30 minutes, 3-4 times a day, or as told by your doctor.  Do not sit with your legs crossed.  Walk often to increase the blood flow in your legs. Ask your doctor what level of activity is safe for you.  If you are taking a long ride in a car or plane, take a break to walk around at least once every two hours, or as told by your doctor. Ask your doctor if you should take aspirin before long trips. General instructions   Wear elastic stockings, compression stockings, or support hose as told by your doctor. This is very important.  Raise the foot of your bed as told by your doctor.  Do not smoke.  Keep all follow-up visits as told by your doctor. This is important. Contact a doctor if:  You have a fever.  Your ulcer is getting larger or is not healing.  Your pain gets worse.  You have more redness or swelling around your ulcer.  You have more fluid, blood, or pus coming from your ulcer after it has been cleaned by you or your doctor.  You have warmth or a bad smell coming from your ulcer. This information is not intended to replace advice given to you by your health care provider. Make sure you discuss any questions you have with your health care provider. Document Released: 01/27/2004 Document Revised: 05/27/2015 Document Reviewed: 04/29/2014 Elsevier Interactive Patient Education  [employer]2018 Elsevier Inc.

## 2016-08-18 NOTE — Addendum Note (Signed)
Addended by: Burton Apley A on: 08/18/2016 11:46 AM   Modules accepted: Orders

## 2016-08-21 ENCOUNTER — Non-Acute Institutional Stay (SKILLED_NURSING_FACILITY): Payer: Medicare Other | Admitting: Internal Medicine

## 2016-08-21 ENCOUNTER — Encounter: Payer: Self-pay | Admitting: Internal Medicine

## 2016-08-21 DIAGNOSIS — I10 Essential (primary) hypertension: Secondary | ICD-10-CM

## 2016-08-21 DIAGNOSIS — E7800 Pure hypercholesterolemia, unspecified: Secondary | ICD-10-CM | POA: Diagnosis not present

## 2016-08-21 DIAGNOSIS — N183 Chronic kidney disease, stage 3 unspecified: Secondary | ICD-10-CM

## 2016-08-21 DIAGNOSIS — E039 Hypothyroidism, unspecified: Secondary | ICD-10-CM | POA: Diagnosis not present

## 2016-08-21 DIAGNOSIS — R600 Localized edema: Secondary | ICD-10-CM | POA: Diagnosis not present

## 2016-08-21 DIAGNOSIS — I4891 Unspecified atrial fibrillation: Secondary | ICD-10-CM | POA: Diagnosis not present

## 2016-08-21 NOTE — Progress Notes (Signed)
Location:   [facility] Room Number: 111/W Place of Service:  SNF [id number]) Provider:  Lenon Curt, Freddie Breech, MD  Patient Care Team: Mahlon Gammon, MD as PCP - General (Internal Medicine) Wyline Mood [name], MD as Consulting Physician (Cardiology) Synetta Shadow as Physician Assistant (Internal Medicine)  Extended Emergency Contact Information Primary Emergency Contact: Casimer Lanius [place] Home Phone: [phone] Mobile Phone: [phone] Relation: None Secondary Emergency Contact: Margarito Courser [place] Home Phone: [phone] Relation: None  Code Status:  DNR Goals of care: Advanced Directive information Advanced Directives 08/21/2016  Does Patient Have a Medical Advance Directive? Yes  Type of Advance Directive Out of facility DNR (pink MOST or yellow form)  Does patient want to make changes to medical advance directive? No - Patient declined  Copy of Healthcare Power of Attorney in Chart? -  Would patient like information on creating a medical advance directive? No - Patient declined     Chief Complaint  Patient presents with  . Medical Management of Chronic Issues    Routine Visit,     HPI:  Pt is a 83 y.o. female seen today for medical management of chronic diseases.    Patient has Patient has H/O CAD with Cath done in 2008 on Medical management, Paroxysmal Atrial Fibrillation Not on any Anticoagulation, Hypertension, Hyperlipidemia. Staph Bacteremia in 12/25, Right Great Toe Amputation in 01/18 And left Peroneal Atherectomy. Patient have non healing ulcers in her feet B/L and she follows with Vascular surgery. Wounds are difficult to heal due to her poor circulation and venous stasis. According to patient and Notes reviewed from vascular her wounds are doing better. She gets hydrogel Dressing Everyday. She is doing well in facility. Her weight is stable at 120 lbs. Her lasix was held few  weeks ago due to high BUN but  is back to her usual dose. Patient has no other Nursing issues. Past Medical History:  Diagnosis Date  . Acid reflux   . Carotid artery occlusion    minimal  . Coronary artery disease   . Hypercholesteremia   . Hypertension   . Myocardial infarct (HCC)   . PAF (paroxysmal atrial fibrillation) (HCC)   . Skin disorder   . Systolic murmur   . Thyroid disease    Past Surgical History:  Procedure Laterality Date  . ABDOMINAL HYSTERECTOMY    . AMPUTATION Right 01/05/2016   Procedure: RIGHT GREAT TOE AMPUTATION;  Surgeon: Nada Libman, MD;  Location: [facility] OR;  Service: Vascular;  Laterality: Right;  . CARDIAC CATHETERIZATION    . CARDIOVERSION  2012  . IR GENERIC HISTORICAL  12/30/2015   IR ANGIOGRAM EXTREMITY BILATERAL 12/30/2015 [facility]  . IR GENERIC HISTORICAL  12/30/2015   IR ANGIOGRAM SELECTIVE EACH ADDITIONAL VESSEL 12/30/2015 [facility]  . LOWER EXTREMITY ANGIOGRAM Right 12/30/2015   Procedure: RIGHT LOWER EXTREMITY ANGIOGRAM;  Surgeon: Ancil Linsey, MD;  Location: [facility];  Service: Vascular;  Laterality: Right;  accessed through left groin area - dressed with two 2x2 and 4x4 opsite  . PERIPHERAL VASCULAR CATHETERIZATION N/A 01/04/2016   Procedure: Lower Extremity Angiography;  Surgeon: Nada Libman, MD;  Location: [facility] INVASIVE CV LAB;  Service: Cardiovascular;  Laterality: N/A;  . PERIPHERAL VASCULAR CATHETERIZATION Right 01/04/2016   Procedure: Peripheral Vascular Atherectomy;  Surgeon: Nada Libman, MD;  Location: [facility] INVASIVE CV LAB;  Service: Cardiovascular;  Laterality: Right;  Peroneal  . PERIPHERAL VASCULAR CATHETERIZATION  N/A 02/01/2016   Procedure: Abdominal Aortogram w/Lower Extremity;  Surgeon: Nada Libman, MD;  Location: [facility] INVASIVE CV LAB;  Service: Cardiovascular;  Laterality: N/A;  . PERIPHERAL VASCULAR CATHETERIZATION  02/01/2016   Procedure: Peripheral Vascular Atherectomy;  Surgeon: Nada Libman, MD;   Location: [facility] INVASIVE CV LAB;  Service: Cardiovascular;;  Left   . THYROID SURGERY      No Known Allergies  Outpatient Encounter Prescriptions as of 08/21/2016  Medication Sig  . acetaminophen (TYLENOL) 325 MG tablet Take 650 mg by mouth every 4 (four) hours as needed.   . Amino Acids-Protein Hydrolys (FEEDING SUPPLEMENT, PRO-STAT SUGAR FREE 64,) LIQD Take 30 mLs by mouth 2 (two) times daily between meals.  Marland Kitchen amiodarone (PACERONE) 200 MG tablet Take 0.5 tablets (100 mg total) by mouth daily.  Marland Kitchen amLODipine (NORVASC) 10 MG tablet Take 10 mg by mouth daily.  Marland Kitchen aspirin EC 81 MG tablet Take 1 tablet (81 mg total) by mouth daily.  Marland Kitchen atorvastatin (LIPITOR) 20 MG tablet Take 20 mg by mouth every other day. Take 1 tablet (20 mg) by mouth every other night  . docusate sodium (COLACE) 100 MG capsule Take 100 mg by mouth daily.  . ferrous sulfate (QC FERROUS SULFATE) 325 (65 FE) MG tablet Take 325 mg by mouth daily with breakfast.  . furosemide (LASIX) 20 MG tablet Give 1 tablet by mouth daily and prn as needed  . levothyroxine (SYNTHROID, LEVOTHROID) 75 MCG tablet Take 75 mcg by mouth daily.    . magnesium hydroxide (MILK OF MAGNESIA) 400 MG/5ML suspension Take 30 mLs by mouth daily as needed for mild constipation.  Marland Kitchen omeprazole (PRILOSEC) 40 MG capsule Take 40 mg by mouth at bedtime.  . senna (SENOKOT) 8.6 MG TABS tablet Take 2 tablets by mouth daily.   . [DISCONTINUED] vitamin B-12 (CYANOCOBALAMIN) 1000 MCG tablet Take 1,000 mcg by mouth daily.   No facility-administered encounter medications on file as of 08/21/2016.     Review of Systems  Review of Systems  Constitutional: Negative for activity change, appetite change, chills, diaphoresis, fatigue and fever.  HENT: Negative for mouth sores, postnasal drip, rhinorrhea, sinus pain and sore throat.   Respiratory: Negative for apnea, cough, chest tightness, shortness of breath and wheezing.   Cardiovascular: Negative for chest pain, palpitations  and leg swelling.  Gastrointestinal: Negative for abdominal distention, abdominal pain, constipation, diarrhea, nausea and vomiting.  Genitourinary: Negative for dysuria and frequency.  Musculoskeletal: Negative for arthralgias, joint swelling and myalgias.  Skin: Negative for rash.  Neurological: Negative for dizziness, syncope, weakness, light-headedness and numbness.  Psychiatric/Behavioral: Negative for behavioral problems, confusion and sleep disturbance.      There is no immunization history on file for this patient. Pertinent  Health Maintenance Due  Topic Date Due  . DEXA SCAN  09/21/2016 (Originally 02/09/1985)  . INFLUENZA VACCINE  12/02/2016 (Originally 08/02/2016)  . PNA vac Low Risk Adult (1 of 2 - PCV13) 12/02/2016 (Originally 02/09/1985)   No flowsheet data found. Functional Status Survey:    Vitals:   08/21/16 0939  BP: (!) 158/73  Pulse: (!) 56  Resp: 20  Temp: (!) 97.2 F (36.2 C)  TempSrc: Oral  SpO2: 100%  Weight: 119 lb 12.8 oz (54.3 kg)  Height: 5\' 5"  (1.651 m)   Body mass index is 19.94 kg/m. Physical Exam  Constitutional: She is oriented to person, place, and time. She appears well-developed and well-nourished.  HENT:  Head: Normocephalic.  Eyes: Pupils are equal, round,  and reactive to light.  Neck: Neck supple.  Cardiovascular: Normal rate and normal heart sounds.   Pulmonary/Chest: Effort normal and breath sounds normal. No respiratory distress. She has no wheezes.  Abdominal: Soft. Bowel sounds are normal. She exhibits no distension. There is no tenderness. There is no rebound.  Musculoskeletal:  Trace edema B/l  Neurological: She is alert and oriented to person, place, and time.  Skin: Skin is warm and dry.  Psychiatric: She has a normal mood and affect. Her behavior is normal.    Labs reviewed:  Recent Labs  05/12/16 0700  07/04/16 0500 07/11/16 0800 07/18/16 0740  NA 140  < > 142 142 140  K 3.9  < > 3.7 3.9 3.7  CL 103  < > 106  106 103  CO2 29  < > 27 28 28   GLUCOSE 90  < > 71 79 84  BUN 33*  < > 37* 37* 37*  CREATININE 1.30*  < > 1.33* 1.35* 1.43*  CALCIUM 8.9  < > 8.7* 9.2 8.7*  MG 2.1  --   --   --   --   < > = values in this interval not displayed.  Recent Labs  01/17/16 1718 03/07/16 0600 05/12/16 0700  AST 22 13* 19  ALT <5* 10* 15  ALKPHOS 95 78 97  BILITOT 0.5 0.5 0.3  PROT 7.0 6.5 7.3  ALBUMIN 2.9* 2.9* 3.6    Recent Labs  04/10/16 0700 05/12/16 0700 07/03/16 0500  WBC 4.1 7.4 4.6  NEUTROABS 2.5 5.0 2.3  HGB 9.9* 11.6* 11.3*  HCT 31.7* 36.8 36.5  MCV 86.4 82.9 83.9  PLT 317 327 306   Lab Results  Component Value Date   TSH 2.487 03/07/2016   No results found for: HGBA1C Lab Results  Component Value Date   CHOL 105 03/07/2016   HDL 38 (L) 03/07/2016   LDLCALC 57 03/07/2016   TRIG 50 03/07/2016   CHOLHDL 2.8 03/07/2016    Significant Diagnostic Results in last 30 days:  No results found.  Assessment/Plan  peripheral arterial Disease with Leg Ulcers  As per Vascular surgery Patient is  s/p atherectomy of right peroneal artery, angioplasty of the right peroneal artery and tibioperoneal trunk on 01/04/16 and then underwent right great toe amputation on 01/05/16.  Per their Notes Bilateral heel and lateral right foot ulcers are granulating and contracting slowly. The right posterior heel ulcer has heeled.  Ulcers are probably secondary to both venous stasis and peripheral artery disease.  Patient on Hydrogel Dressing bilateral and follow up with them in 3 months.  Atrial fibrillation,  Rate controlled. On Aspirin and Amiodarone,  Never been on Anticoagulation   Essential hypertension Wildly elevated today. continue same dose of Norvasc and lasix.  Hypothyroidism TSH normal in 03/18  Chronic renal insufficiency, stage 3  Creat staying stable  Bilateral leg edema Improved on lasix Hyperlipidemia' LDL 57 in 03/18 On statin   Family/ staff Communication:    Labs/tests ordered:    Total time spent in this patient care encounter was 25_ minutes; greater than 50% of the visit spent counseling patient, reviewing records , Labs and coordinating care for problems addressed at this encounter.

## 2016-09-15 ENCOUNTER — Non-Acute Institutional Stay (SKILLED_NURSING_FACILITY): Payer: Medicare Other | Admitting: Internal Medicine

## 2016-09-15 ENCOUNTER — Encounter: Payer: Self-pay | Admitting: Internal Medicine

## 2016-09-15 DIAGNOSIS — I2510 Atherosclerotic heart disease of native coronary artery without angina pectoris: Secondary | ICD-10-CM

## 2016-09-15 DIAGNOSIS — R600 Localized edema: Secondary | ICD-10-CM | POA: Diagnosis not present

## 2016-09-15 DIAGNOSIS — I779 Disorder of arteries and arterioles, unspecified: Secondary | ICD-10-CM

## 2016-09-15 DIAGNOSIS — N183 Chronic kidney disease, stage 3 unspecified: Secondary | ICD-10-CM

## 2016-09-15 DIAGNOSIS — I4891 Unspecified atrial fibrillation: Secondary | ICD-10-CM

## 2016-09-15 DIAGNOSIS — E039 Hypothyroidism, unspecified: Secondary | ICD-10-CM

## 2016-09-15 DIAGNOSIS — I10 Essential (primary) hypertension: Secondary | ICD-10-CM | POA: Diagnosis not present

## 2016-09-15 NOTE — Progress Notes (Signed)
Location:   [facility] Room Number: 111/W Place of Service:  SNF [phone]) Provider:  Candida Peeling, Freddie Breech, MD  Patient Care Team: Mahlon Gammon, MD as PCP - General (Internal Medicine) Wyline Mood [name], MD as Consulting Physician (Cardiology) Synetta Shadow as Physician Assistant (Internal Medicine)  Extended Emergency Contact Information Primary Emergency Contact: Casimer Lanius [place] Home Phone: [phone] Mobile Phone: [phone] Relation: None Secondary Emergency Contact: Margarito Courser [place] Home Phone: [phone] Relation: None  Code Status:  DNR Goals of care: Advanced Directive information Advanced Directives 09/15/2016  Does Patient Have a Medical Advance Directive? Yes  Type of Advance Directive Out of facility DNR (pink MOST or yellow form)  Does patient want to make changes to medical advance directive? No - Patient declined  Copy of Healthcare Power of Attorney in Chart? -  Would patient like information on creating a medical advance directive? No - Patient declined     Chief Complaint  Patient presents with  . Medical Management of Chronic Issues    Routine Visit   Her medical management of chronic medical conditions including atrial fibrillation-coronary artery disease-hypertension-hyperlipidemia-history of bacteremia-history of bilateral heel ulcers-constipation-hypothyroidism-chronic kidney disease-and edema-as well as anemia.    HPI:  Pt is a 83 y.o. female seen today for medical management of chronic diseases.  As noted above.  She continues to be quite stable-she does have bilateral heel ulcers which are managed by the wound care in facility as well as vascular surgery-these apparently are improving currently covered blood per review of notes these are looking improved without sign of infection and doing well.  She does have a history of bacteremia status post right  great toe amputation January 2018 with a left peroneal atherectomy--  Her weight appears to be stable at 122 pounds-at one point her Lasix was held because of elevated BUN but this appears to have stabilized creatinine was 1.43 BUN 37 on lab done 07/18/2016 will update this.  She does have history of atrial fibrillation which appears rate controlled on amiodarone she is on aspirin for anticoagulation on a more aggressive supplementation because of age and possibility of falls.  Miquel Dunn hypertension blood pressures at times are somewhat elevated but not consistently so I got 150/68 manually however appears her baseline runs more from the 110-140 area per chart review she is on Norvasc as well as Lasix.  She also has a history of coronary artery disease which is been quite stable she is on aspirin as well as a statin LDL was 53 in March 2018 will update this.  She does have a history of anemia with an element suspect of chronic disease as well as iron deficiency hemoglobin 11.3 appears relatively stable this will warrant updating as well.  In regards hyperthyroidism she is on Synthroid TSH was within normal limits at 2.487 on lab done in March 2018-.  Currently she has no complaints she is sitting comfortably in her wheelchair-.       Past Medical History:  Diagnosis Date  . Acid reflux   . Carotid artery occlusion    minimal  . Coronary artery disease   . Hypercholesteremia   . Hypertension   . Myocardial infarct (HCC)   . PAF (paroxysmal atrial fibrillation) (HCC)   . Skin disorder   . Systolic murmur   . Thyroid disease    Past Surgical History:  Procedure Laterality Date  . ABDOMINAL HYSTERECTOMY    .  AMPUTATION Right 01/05/2016   Procedure: RIGHT GREAT TOE AMPUTATION;  Surgeon: Nada Libman, MD;  Location: [facility] OR;  Service: Vascular;  Laterality: Right;  . CARDIAC CATHETERIZATION    . CARDIOVERSION  2012  . IR GENERIC HISTORICAL  12/30/2015   IR ANGIOGRAM EXTREMITY  BILATERAL 12/30/2015 [facility]  . IR GENERIC HISTORICAL  12/30/2015   IR ANGIOGRAM SELECTIVE EACH ADDITIONAL VESSEL 12/30/2015 [facility]  . LOWER EXTREMITY ANGIOGRAM Right 12/30/2015   Procedure: RIGHT LOWER EXTREMITY ANGIOGRAM;  Surgeon: Ancil Linsey, MD;  Location: [facility];  Service: Vascular;  Laterality: Right;  accessed through left groin area - dressed with two 2x2 and 4x4 opsite  . PERIPHERAL VASCULAR CATHETERIZATION N/A 01/04/2016   Procedure: Lower Extremity Angiography;  Surgeon: Nada Libman, MD;  Location: [facility] INVASIVE CV LAB;  Service: Cardiovascular;  Laterality: N/A;  . PERIPHERAL VASCULAR CATHETERIZATION Right 01/04/2016   Procedure: Peripheral Vascular Atherectomy;  Surgeon: Nada Libman, MD;  Location: [facility] INVASIVE CV LAB;  Service: Cardiovascular;  Laterality: Right;  Peroneal  . PERIPHERAL VASCULAR CATHETERIZATION N/A 02/01/2016   Procedure: Abdominal Aortogram w/Lower Extremity;  Surgeon: Nada Libman, MD;  Location: [facility] INVASIVE CV LAB;  Service: Cardiovascular;  Laterality: N/A;  . PERIPHERAL VASCULAR CATHETERIZATION  02/01/2016   Procedure: Peripheral Vascular Atherectomy;  Surgeon: Nada Libman, MD;  Location: [facility] INVASIVE CV LAB;  Service: Cardiovascular;;  Left   . THYROID SURGERY      No Known Allergies  Outpatient Encounter Prescriptions as of 09/15/2016  Medication Sig  . acetaminophen (TYLENOL) 325 MG tablet Take 650 mg by mouth every 4 (four) hours as needed.   . Amino Acids-Protein Hydrolys (FEEDING SUPPLEMENT, PRO-STAT SUGAR FREE 64,) LIQD Take 30 mLs by mouth 2 (two) times daily between meals.  Marland Kitchen amiodarone (PACERONE) 200 MG tablet Take 0.5 tablets (100 mg total) by mouth daily.  Marland Kitchen amLODipine (NORVASC) 10 MG tablet Take 10 mg by mouth daily.  Marland Kitchen aspirin EC 81 MG tablet Take 1 tablet (81 mg total) by mouth daily.  Marland Kitchen atorvastatin (LIPITOR) 20 MG tablet Take 20 mg by mouth every other day. Take 1 tablet (20 mg) by mouth every other night  .  docusate sodium (COLACE) 100 MG capsule Take 100 mg by mouth daily.  . ferrous sulfate (QC FERROUS SULFATE) 325 (65 FE) MG tablet Take 325 mg by mouth daily with breakfast.  . furosemide (LASIX) 20 MG tablet Give 1 tablet by mouth daily and prn as needed  . levothyroxine (SYNTHROID, LEVOTHROID) 75 MCG tablet Take 75 mcg by mouth daily.    Marland Kitchen omeprazole (PRILOSEC) 40 MG capsule Take 40 mg by mouth at bedtime.  . senna (SENOKOT) 8.6 MG TABS tablet Take 2 tablets by mouth daily.   . [DISCONTINUED] magnesium hydroxide (MILK OF MAGNESIA) 400 MG/5ML suspension Take 30 mLs by mouth daily as needed for mild constipation.   No facility-administered encounter medications on file as of 09/15/2016.      Review of Systems  General she does not complaining of any fever chills weight appears to be stable.  Skin does not complain of any rashes or itching does have bilateral heel wounds which are followed by wound care and vascular surgery and thought to be doing well she does receive topical treatment as well as dressing.  It ears eyes nose mouth and throat does not complaining of any sore throat or visual changes or difficulty swallowing.  She does have prescription lenses.  Respiratory denies shortness  breath or cough.  Cardiac does not complaining of any chest pain edema appears to be well controlled I would say fairly minimal trace.  GI is not complaining of any abdominal discomfort nausea vomiting diarrhea constipation.  GU is not complaining of dysuria.  Muscle skeletal has lower extremity weakness largely ambulates in a wheelchair but does not complaining of joint pain.  Neurologic does not complain of dizziness headache or syncope.  Psych continues to be in very good spirits did not show any signs of depression or anxiety.      There is no immunization history on file for this patient. Pertinent  Health Maintenance Due  Topic Date Due  . DEXA SCAN  09/21/2016 (Originally 02/09/1985)  .  INFLUENZA VACCINE  12/02/2016 (Originally 08/02/2016)  . PNA vac Low Risk Adult (1 of 2 - PCV13) 12/02/2016 (Originally 02/09/1985)   No flowsheet data found. Functional Status Survey:    Vitals:   09/15/16 1118  BP: (!) 156/73  Pulse: (!) 58  Resp: 20  Temp: (!) 95.8 F (35.4 C)  TempSrc: Oral  SpO2: 100%  Weight: 122 lb (55.3 kg)  Height:  (1.651 m)  Systolic is in the 150s manually today but baseline appears to be lower as noted above often in the 110-140 range.  Weight is stable at 122 pounds  Physical Exam   In general this is a very pleasant elderly female in no distress sitting comfortably in her wheelchair.  Her skin is warm and dry her heels bilaterally are currently wrapped again as noted above she is seen by vascular as well as wound care nurse and thought to be doing very well.  Eyes she has prescription lenses pupils appear reactive to light sclera and conjunctiva is clear visual acuity appears grossly intact.  Her oropharynx is clear mucous memories are moist.  Chest is clear to auscultation there is no labored breathing.  Heart is regular irregular rate and rhythm without murmur gallop or rub she has minimal-trace lower extremity edema.  Her abdomen is soft nontender with positive bowel sounds.  Muscle skeletal largely ambulates in a wheelchair moves all extremities 4 baseline with some lower extremity weakness which is not new this is complicated with her heel ulcers.--She is status post right great toe amputation  Neurologic is grossly intact her speech is clear no lateralizing findings.  Psych she is grossly alert and oriented very pleasant and appropriate  Labs reviewed:  Recent Labs  05/12/16 0700  07/04/16 0500 07/11/16 0800 07/18/16 0740  NA 140  < > 142 142 140  K 3.9  < > 3.7 3.9 3.7  CL 103  < > 106 106 103  CO2 29  < > GLUCOSE 90  < > 71 79 84  BUN 33*  < > 37* 37* 37*  CREATININE 1.30*  < > 1.33* 1.35* 1.43*  CALCIUM  8.9  < > 8.7* 9.2 8.7*  MG 2.1  --   --   --   --   < > = values in this interval not displayed.  Recent Labs  01/17/16 1718 03/07/16 0600 05/12/16 0700  AST 22 13* 19  ALT <5* 10* 15  ALKPHOS 95 78 97  BILITOT 0.5 0.5 0.3  PROT 7.0 6.5 7.3  ALBUMIN 2.9* 2.9* 3.6    Recent Labs  04/10/16 0700 05/12/16 0700 07/03/16 0500  WBC 4.1 7.4 4.6  NEUTROABS 2.5 5.0 2.3  HGB 9.9* 11.6* 11.3*  HCT 31.7*  36.8 36.5  MCV 86.4 82.9 83.9  PLT 317 327 306   Lab Results  Component Value Date   TSH 2.487 03/07/2016   No results found for: HGBA1C Lab Results  Component Value Date   CHOL 105 03/07/2016   HDL 38 (L) 03/07/2016   LDLCALC 57 03/07/2016   TRIG 50 03/07/2016   CHOLHDL 2.8 03/07/2016    Significant Diagnostic Results in last 30 days:  No results found.  Assessment/Plan  #1-history of atrial fibrillation this appears rate controlled on amiodarone she is on aspirin for anticoagulation not on more aggressive anticoagulation because of her age and fall risk.  #2 hypertension she continues to have variable systolic blood pressures as noted above however I do not see consistent elevations per chart review at this point will monitor she is on Norvasc as well as Lasix.  #3 coronary artery disease this appears to be stable relatively asymptomatic she is on aspirin as well as a statin her LDL was so 53 on lab done on March 2018.  #4 history of peripheral arterial disease again she is status post right great toe amputation is followed closely by vascular surgery she is status post left perineal arthrectomy.  #5-history of nonhealing ulcers heels again this is followed closely by wound care as well as vascular surgery and thought to be doing well.--- Per review of consult notes  #6-history of chronic kidney disease creatinine of 1.43 BUN of 37 appears relatively baseline on lab done in July will update this for updated values with some fluctuations in the past she is on  Lasix.  #7 history of lower extremity edema this appears stable weight is stable edema appears to be fairly minimal she is on low-dose Lasix--per echo in January 2018 thought to have grade 2 diastolic dysfunction-ejection fraction was 60-65 percent  #8-history hypothyroidism TSH has been within normal limits most recently in March will monitor periodically.  #9 history of anemia suspect an element of chronic disease also is on iron supplementation most recently was 11.3--- this appears stable per chart review will update this.  #10-constipation she is on senna and Colace apparently this has not been an issue no complaints of diarrhea or constipation will monitor and continue current medications for now.  #11 hyperlipidemia as noted above LDL was 53 on lab done in March she is on a statin will update this.  #12 history of bacteremia no recent evidence of this this was a significant issue prompting admission to the hospital in the past and amputation but this appears to have largely resolved.  [id number] note greater than 35 minutes spent assessing patient-reviewing her chart-reviewing her labs-reviewing consult notes-and coordinating and formulating a plan of care for numerous diagnoses-of note greater than 50% time spent coordinating plan of care

## 2016-09-19 ENCOUNTER — Other Ambulatory Visit (HOSPITAL_COMMUNITY)
Admission: RE | Admit: 2016-09-19 | Discharge: 2016-09-19 | Disposition: A | Discharge status: Home / Self Care | Payer: Medicare Other | Source: Skilled Nursing Facility | Attending: Internal Medicine | Admitting: Internal Medicine

## 2016-09-19 DIAGNOSIS — E785 Hyperlipidemia, unspecified: Secondary | ICD-10-CM | POA: Insufficient documentation

## 2016-09-19 LAB — BASIC METABOLIC PANEL
Anion gap: 9 (ref 5–15)
BUN: 34 mg/dL — AB (ref 6–20)
CO2: 28 mmol/L (ref 22–32)
Calcium: 9 mg/dL (ref 8.9–10.3)
Chloride: 103 mmol/L (ref 101–111)
Creatinine, Ser: 1.33 mg/dL — ABNORMAL HIGH (ref 0.44–1.00)
GFR calc Af Amer: 38 mL/min — ABNORMAL LOW (ref >60)
GFR calc non Af Amer: 33 mL/min — ABNORMAL LOW (ref >60)
Glucose, Bld: 80 mg/dL (ref 65–99)
Potassium: 3.8 mmol/L (ref 3.5–5.1)
SODIUM: 140 mmol/L (ref 135–145)

## 2016-09-19 LAB — LIPID PANEL
CHOL/HDL RATIO: 3.5 ratio
Cholesterol: 145 mg/dL (ref 0–200)
HDL: 41 mg/dL (ref >40)
LDL Cholesterol: 81 mg/dL (ref 0–99)
Triglycerides: 116 mg/dL (ref <150)
VLDL: 23 mg/dL (ref 0–40)

## 2016-09-19 LAB — TSH: TSH: 22.253 u[IU]/mL — ABNORMAL HIGH (ref 0.350–4.500)

## 2016-09-20 ENCOUNTER — Encounter (HOSPITAL_COMMUNITY)
Admission: RE | Admit: 2016-09-20 | Discharge: 2016-09-20 | Disposition: A | Discharge status: Home / Self Care | Payer: Medicare Other | Source: Skilled Nursing Facility | Attending: Internal Medicine | Admitting: Internal Medicine

## 2016-09-20 DIAGNOSIS — E039 Hypothyroidism, unspecified: Secondary | ICD-10-CM | POA: Diagnosis not present

## 2016-09-20 DIAGNOSIS — Z48812 Encounter for surgical aftercare following surgery on the circulatory system: Secondary | ICD-10-CM | POA: Diagnosis present

## 2016-09-20 DIAGNOSIS — K219 Gastro-esophageal reflux disease without esophagitis: Secondary | ICD-10-CM | POA: Insufficient documentation

## 2016-09-20 DIAGNOSIS — I739 Peripheral vascular disease, unspecified: Secondary | ICD-10-CM | POA: Diagnosis present

## 2016-09-20 DIAGNOSIS — I5022 Chronic systolic (congestive) heart failure: Secondary | ICD-10-CM | POA: Diagnosis present

## 2016-09-20 LAB — TSH: TSH: 19.505 u[IU]/mL — ABNORMAL HIGH (ref 0.350–4.500)

## 2016-10-05 ENCOUNTER — Ambulatory Visit (INDEPENDENT_AMBULATORY_CARE_PROVIDER_SITE_OTHER): Payer: Medicare Other | Admitting: Cardiology

## 2016-10-05 ENCOUNTER — Encounter: Payer: Self-pay | Admitting: Cardiology

## 2016-10-05 VITALS — BP 136/66 | HR 59 | Ht 66.0 in | Wt 122.0 lb

## 2016-10-05 DIAGNOSIS — I10 Essential (primary) hypertension: Secondary | ICD-10-CM | POA: Diagnosis not present

## 2016-10-05 DIAGNOSIS — I2510 Atherosclerotic heart disease of native coronary artery without angina pectoris: Secondary | ICD-10-CM

## 2016-10-05 DIAGNOSIS — E782 Mixed hyperlipidemia: Secondary | ICD-10-CM

## 2016-10-05 DIAGNOSIS — I4891 Unspecified atrial fibrillation: Secondary | ICD-10-CM

## 2016-10-05 NOTE — Progress Notes (Signed)
Clinical Summary [name] is a 83 y.o.female seen today for follow up of the following medical problems.    1. CAD  - cath 2008 with 80% diag, 80% ostial LCX that has been treated medically per notes. LVEF at that time 60% by LV gram.    - denies any chest pain/SOB/DOE since last visit.     2. Parox afib  - has been managed on amio, has had prior DCCV 2012.She had bradycardia when previously on dilt .  - not on anticoag by her previous cardiologist.From what I can gather from the notes it seems this is because she has not had any clear recurrence of her afib since her DCCV   - denies any recent palpitations since our last visit.   3. HTN  - home bp's 130s/60s - compliant with meds  4. Hyperlipidemia  - did not tolerate daily statin, doing well on every other day dosing.  - 09/2016 TC 145 TG 116 HDL 41 LDL 81  -she remains compliant with meds  5. PAD - followed by vascular   Past Medical History:  Diagnosis Date  . Acid reflux   . Carotid artery occlusion    minimal  . Coronary artery disease   . Hypercholesteremia   . Hypertension   . Myocardial infarct (HCC)   . PAF (paroxysmal atrial fibrillation) (HCC)   . Skin disorder   . Systolic murmur   . Thyroid disease      No Known Allergies   No current outpatient prescriptions on file.   No current facility-administered medications for this visit.      Past Surgical History:  Procedure Laterality Date  . ABDOMINAL HYSTERECTOMY    . AMPUTATION Right 01/05/2016   Procedure: RIGHT GREAT TOE AMPUTATION;  Surgeon: Nada Libman, MD;  Location: [facility] OR;  Service: Vascular;  Laterality: Right;  . CARDIAC CATHETERIZATION    . CARDIOVERSION  2012  . IR GENERIC HISTORICAL  12/30/2015   IR ANGIOGRAM EXTREMITY BILATERAL 12/30/2015 [facility]  . IR GENERIC HISTORICAL  12/30/2015   IR ANGIOGRAM SELECTIVE EACH ADDITIONAL VESSEL 12/30/2015 [facility]  . LOWER EXTREMITY ANGIOGRAM  Right 12/30/2015   Procedure: RIGHT LOWER EXTREMITY ANGIOGRAM;  Surgeon: Ancil Linsey, MD;  Location: [facility] OR[facility];  Service: Vascular;  Laterality: Right;  accessed through left groin area - dressed with two 2x2 and 4x4 opsite  . PERIPHERAL VASCULAR CATHETERIZATION N/A 01/04/2016   Procedure: Lower Extremity Angiography;  Surgeon: Nada Libman, MD;  Location: [facility] INVASIVE CV LAB;  Service: Cardiovascular;  Laterality: N/A;  . PERIPHERAL VASCULAR CATHETERIZATION Right 01/04/2016   Procedure: Peripheral Vascular Atherectomy;  Surgeon: Nada Libman, MD;  Location: [facility] INVASIVE CV LAB;  Service: Cardiovascular;  Laterality: Right;  Peroneal  . PERIPHERAL VASCULAR CATHETERIZATION N/A 02/01/2016   Procedure: Abdominal Aortogram w/Lower Extremity;  Surgeon: Nada Libman, MD;  Location: [facility] INVASIVE CV LAB;  Service: Cardiovascular;  Laterality: N/A;  . PERIPHERAL VASCULAR CATHETERIZATION  02/01/2016   Procedure: Peripheral Vascular Atherectomy;  Surgeon: Nada Libman, MD;  Location: [facility] INVASIVE CV LAB;  Service: Cardiovascular;;  Left   . THYROID SURGERY       No Known Allergies    Family History  Problem Relation Age of Onset  . Family history unknown: Yes     Social History [name] reports that she has never smoked. She has never used smokeless tobacco. [name] reports that she does not drink alcohol.  Review of Systems CONSTITUTIONAL: No weight loss, fever, chills, weakness or fatigue.  HEENT: Eyes: No visual loss, blurred vision, double vision or yellow sclerae.No hearing loss, sneezing, congestion, runny nose or sore throat.  SKIN: No rash or itching.  CARDIOVASCULAR: per hpi RESPIRATORY: No shortness of breath, cough or sputum.  GASTROINTESTINAL: No anorexia, nausea, vomiting or diarrhea. No abdominal pain or blood.  GENITOURINARY: No burning on urination, no polyuria NEUROLOGICAL: No headache, dizziness, syncope, paralysis, ataxia, numbness or tingling in the  extremities. No change in bowel or bladder control.  MUSCULOSKELETAL: No muscle, back pain, joint pain or stiffness.  LYMPHATICS: No enlarged nodes. No history of splenectomy.  PSYCHIATRIC: No history of depression or anxiety.  ENDOCRINOLOGIC: No reports of sweating, cold or heat intolerance. No polyuria or polydipsia.  Marland Kitchen   Physical Examination Vitals:   10/05/16 1114  BP: 136/66  Pulse: (!) 59  SpO2: 99%   Vitals:   10/05/16 1114  Weight: 122 lb (55.3 kg)  Height:  (1.676 m)    Gen: resting comfortably, no acute distress HEENT: no scleral icterus, pupils equal round and reactive, no palptable cervical adenopathy,  CV: RRR, no m/r/g, no jvd Resp: Clear to auscultation bilaterally GI: abdomen is soft, non-tender, non-distended, normal bowel sounds, no hepatosplenomegaly MSK: extremities are warm, no edema.  Skin: warm, no rash Neuro:  no focal deficits Psych: appropriate affect   Diagnostic Studies  04/2012 Echo Severe LVH, grade I diastolic dysfunction, mild MR. No LVEF mentioned. Aortic sclerosis without stenosis   03/2006 Cath RESULTS:  1. Hemodynamic monitoring: Central aortic pressure was 184/80. Left  ventricular pressure was 184/22 with no significant valve gradient  at the time of pull-back.  2. Ventriculography: Ventriculography in the RAO projection using 20  mL of contrast, 12 mL per second, revealed normal LV systolic  function, mild left ventricular hypertrophy. Ejection fraction  excess of 60% and the end-diastolic pressure was 25.  3. Distal aortogram: Distal aortogram done above the level of renal  artery showed no evidence of renal artery stenosis and no abdominal  aortic aneurysm.  CORONARY ARTERIOGRAPHY: On fluoroscopy, faint calcification was seen in  the distribution of the left main, proximal LAD and proximal right  coronary artery.  1. Left main normal. It trifurcated.  2. Circumflex. There was ostial 80% narrowing of  the circumflex as it  came off the left main. The mid and distal vessel were free of  disease.  3. Ramus intermediate (optional diagonal) moderate size and normal.  4. Left anterior descending. The LAD extended down to the apex of the  heart. The distal LAD was free of disease. The mid portion of the  LAD had an area of 30-40% narrowing at the takeoff of first  diagonal. The first diagonal had an 85% area of ostial narrowing.  5. Right coronary artery. The right coronary artery gave rise to the  PDA. This vessel was free of disease. Several injections of the  cusp did not show any evidence of ostial narrowing.  CONCLUSION:  1. High-grade ostial narrowing of the circumflex at the left main.  2. Ostial narrowing of the first diagonal at the take-off of the LAD.  3. Left ventricular hypertrophy.  4. Normal LV systolic function.  It appears this lady will need bypass surgery because of the ostial  circumflex lesion. I plan to ask my colleagues for their opinion before  we refer her to CVTS. She will be restarted on IV heparin in about 3  hours following her cath. I have temporarily stopped her Coumadin.    03/2015 Echo Study Conclusions  - Left ventricle: The cavity size was normal. Wall thickness was  increased in a pattern of severe LVH. Systolic function was  normal. The estimated ejection fraction was in the range of 60%  to 65%. Wall motion was normal; there were no regional wall  motion abnormalities. - Aortic valve: Valve area (VTI): 1.28 cm^2. Valve area (Vmax):  1.31 cm^2. Valve area (Vmean): 1.26 cm^2. - Mitral valve: Thickening and calcification of the basal leaflets.  Calcified annulus. There was mild regurgitation. - Left atrium: The atrium was moderately dilated. - Atrial septum: No defect or patent foramen ovale was identified. - Pulmonary arteries: PA peak pressure: 39 mm Hg (S). - Pericardium, extracardiac: Small mostly posterior  pericardial  effusion no tamponade.    Assessment and Plan  1. CAD  - no symptoms, continue current meds  2. Parox afib  - Has not been on anticoag from her prior cardiologist, I presume due to no detected recurrence of afib after her DCCV in 2012 - no recent symptoms, continue to monitor.    3. HTN  - bp remains at goal, continue current meds  4. Hyperlipidemia  - muscle aches on daily statin, tolerating every other day dosing. - reasonable lipid control given age and side effects on meds, continue current therapy.     F/u 6 months      Antoine Poche, M.D.

## 2016-10-05 NOTE — Patient Instructions (Signed)

## 2016-10-08 ENCOUNTER — Encounter: Payer: Self-pay | Admitting: Cardiology

## 2016-10-25 ENCOUNTER — Encounter (HOSPITAL_COMMUNITY)
Admission: RE | Admit: 2016-10-25 | Discharge: 2016-10-25 | Disposition: A | Discharge status: Home / Self Care | Payer: Medicare Other | Source: Skilled Nursing Facility | Attending: Internal Medicine | Admitting: Internal Medicine

## 2016-10-25 DIAGNOSIS — K219 Gastro-esophageal reflux disease without esophagitis: Secondary | ICD-10-CM | POA: Diagnosis present

## 2016-10-25 DIAGNOSIS — I739 Peripheral vascular disease, unspecified: Secondary | ICD-10-CM | POA: Insufficient documentation

## 2016-10-25 DIAGNOSIS — E039 Hypothyroidism, unspecified: Secondary | ICD-10-CM | POA: Insufficient documentation

## 2016-10-25 DIAGNOSIS — I5022 Chronic systolic (congestive) heart failure: Secondary | ICD-10-CM | POA: Insufficient documentation

## 2016-10-25 DIAGNOSIS — Z48812 Encounter for surgical aftercare following surgery on the circulatory system: Secondary | ICD-10-CM | POA: Insufficient documentation

## 2016-10-25 LAB — TSH: TSH: 11.992 u[IU]/mL — ABNORMAL HIGH (ref 0.350–4.500)

## 2016-10-26 ENCOUNTER — Encounter: Payer: Self-pay | Admitting: Internal Medicine

## 2016-10-26 ENCOUNTER — Non-Acute Institutional Stay (SKILLED_NURSING_FACILITY): Payer: Medicare Other | Admitting: Internal Medicine

## 2016-10-26 DIAGNOSIS — I779 Disorder of arteries and arterioles, unspecified: Secondary | ICD-10-CM | POA: Diagnosis not present

## 2016-10-26 DIAGNOSIS — I10 Essential (primary) hypertension: Secondary | ICD-10-CM | POA: Diagnosis not present

## 2016-10-26 DIAGNOSIS — N183 Chronic kidney disease, stage 3 unspecified: Secondary | ICD-10-CM

## 2016-10-26 DIAGNOSIS — E039 Hypothyroidism, unspecified: Secondary | ICD-10-CM

## 2016-10-26 DIAGNOSIS — I2510 Atherosclerotic heart disease of native coronary artery without angina pectoris: Secondary | ICD-10-CM | POA: Diagnosis not present

## 2016-10-26 DIAGNOSIS — I480 Paroxysmal atrial fibrillation: Secondary | ICD-10-CM | POA: Diagnosis not present

## 2016-10-26 DIAGNOSIS — E7800 Pure hypercholesterolemia, unspecified: Secondary | ICD-10-CM

## 2016-10-26 DIAGNOSIS — R600 Localized edema: Secondary | ICD-10-CM | POA: Diagnosis not present

## 2016-10-26 DIAGNOSIS — D509 Iron deficiency anemia, unspecified: Secondary | ICD-10-CM

## 2016-10-26 DIAGNOSIS — D649 Anemia, unspecified: Secondary | ICD-10-CM | POA: Insufficient documentation

## 2016-10-26 NOTE — Progress Notes (Signed)
Location:   [facility] Room Number: 111/W Place of Service:  SNF [phone]) Provider:  Lenon Curt, Freddie Breech, MD  Patient Care Team: Mahlon Gammon, MD as PCP - General (Internal Medicine) Wyline Mood [name], MD as Consulting Physician (Cardiology) Synetta Shadow as Physician Assistant (Internal Medicine)  Extended Emergency Contact Information Primary Emergency Contact: Casimer Lanius [place] Home Phone: [phone] Mobile Phone: [phone] Relation: None Secondary Emergency Contact: Margarito Courser [place] Home Phone: [phone] Relation: None  Code Status:  DNR Goals of care: Advanced Directive information Advanced Directives 10/26/2016  Does Patient Have a Medical Advance Directive? Yes  Type of Advance Directive Out of facility DNR (pink MOST or yellow form)  Does patient want to make changes to medical advance directive? No - Patient declined  Copy of Healthcare Power of Attorney in Chart? No - copy requested  Would patient like information on creating a medical advance directive? No - Patient declined     Chief Complaint  Patient presents with  . Medical Management of Chronic Issues    Routine Visit, Due T-Dap and Dexa Scan    HPI:  Pt is a 83 y.o. female seen today for medical management of chronic diseases.    Patient has Patient has H/O CAD with Cath done in 2008 on Medical management, Paroxysmal Atrial Fibrillation Not on any Anticoagulation, Hypertension, Hyperlipidemia.  Right Great Toe Amputation in 01/18 And left Peroneal Atherectomy.  Patient is doing well in facility. She was seen by Cardiology recently and no changes were recommended. Her Wound in Right has healed completely and left is almost Healed. She follows with Vascular.  Her Weight is stable at 120- 122 Lbs.  Her Appetite is good. No New Nursing issues.  Past Medical History:  Diagnosis Date  . Acid reflux   . Carotid  artery occlusion    minimal  . Coronary artery disease   . Hypercholesteremia   . Hypertension   . Myocardial infarct (HCC)   . PAF (paroxysmal atrial fibrillation) (HCC)   . Skin disorder   . Systolic murmur   . Thyroid disease    Past Surgical History:  Procedure Laterality Date  . ABDOMINAL HYSTERECTOMY    . AMPUTATION Right 01/05/2016   Procedure: RIGHT GREAT TOE AMPUTATION;  Surgeon: Nada Libman, MD;  Location: [facility] OR;  Service: Vascular;  Laterality: Right;  . CARDIAC CATHETERIZATION    . CARDIOVERSION  2012  . IR GENERIC HISTORICAL  12/30/2015   IR ANGIOGRAM EXTREMITY BILATERAL 12/30/2015 [facility]  . IR GENERIC HISTORICAL  12/30/2015   IR ANGIOGRAM SELECTIVE EACH ADDITIONAL VESSEL 12/30/2015 [facility]  . LOWER EXTREMITY ANGIOGRAM Right 12/30/2015   Procedure: RIGHT LOWER EXTREMITY ANGIOGRAM;  Surgeon: Ancil Linsey, MD;  Location: [facility];  Service: Vascular;  Laterality: Right;  accessed through left groin area - dressed with two 2x2 and 4x4 opsite  . PERIPHERAL VASCULAR CATHETERIZATION N/A 01/04/2016   Procedure: Lower Extremity Angiography;  Surgeon: Nada Libman, MD;  Location: [facility] INVASIVE CV LAB;  Service: Cardiovascular;  Laterality: N/A;  . PERIPHERAL VASCULAR CATHETERIZATION Right 01/04/2016   Procedure: Peripheral Vascular Atherectomy;  Surgeon: Nada Libman, MD;  Location: [facility] INVASIVE CV LAB;  Service: Cardiovascular;  Laterality: Right;  Peroneal  . PERIPHERAL VASCULAR CATHETERIZATION N/A 02/01/2016   Procedure: Abdominal Aortogram w/Lower Extremity;  Surgeon: Nada Libman, MD;  Location: [facility] INVASIVE CV LAB;  Service: Cardiovascular;  Laterality: N/A;  .  PERIPHERAL VASCULAR CATHETERIZATION  02/01/2016   Procedure: Peripheral Vascular Atherectomy;  Surgeon: Nada Libman, MD;  Location: [facility] INVASIVE CV LAB;  Service: Cardiovascular;;  Left   . THYROID SURGERY      No Known Allergies  Outpatient Encounter Prescriptions as of 10/26/2016    Medication Sig  . acetaminophen (TYLENOL) 325 MG tablet Take 650 mg by mouth every 4 (four) hours as needed.   . Amino Acids-Protein Hydrolys (FEEDING SUPPLEMENT, PRO-STAT SUGAR FREE 64,) LIQD Take 30 mLs by mouth 2 (two) times daily between meals.  Marland Kitchen amiodarone (PACERONE) 200 MG tablet Take 0.5 tablets (100 mg total) by mouth daily.  Marland Kitchen amLODipine (NORVASC) 10 MG tablet Take 10 mg by mouth daily.  Marland Kitchen aspirin EC 81 MG tablet Take 1 tablet (81 mg total) by mouth daily.  Marland Kitchen atorvastatin (LIPITOR) 20 MG tablet Take 20 mg by mouth every other day. Take 1 tablet (20 mg) by mouth every other night  . docusate sodium (COLACE) 100 MG capsule Take 100 mg by mouth daily.  . ferrous sulfate (QC FERROUS SULFATE) 325 (65 FE) MG tablet Take 325 mg by mouth daily with breakfast.  . furosemide (LASIX) 20 MG tablet Give 1 tablet by mouth daily and prn as needed  . levothyroxine (SYNTHROID, LEVOTHROID) 100 MCG tablet Take 100 mcg by mouth daily before breakfast.  . omeprazole (PRILOSEC) 40 MG capsule Take 40 mg by mouth at bedtime.  . senna (SENOKOT) 8.6 MG TABS tablet Take 2 tablets by mouth daily.    No facility-administered encounter medications on file as of 10/26/2016.      Review of Systems  Review of Systems  Constitutional: Negative for activity change, appetite change, chills, diaphoresis, fatigue and fever.  HENT: Negative for mouth sores, postnasal drip, rhinorrhea, sinus pain and sore throat.   Respiratory: Negative for apnea, cough, chest tightness, shortness of breath and wheezing.   Cardiovascular: Negative for chest pain, palpitations and leg swelling.  Gastrointestinal: Negative for abdominal distention, abdominal pain, constipation, diarrhea, nausea and vomiting.  Genitourinary: Negative for dysuria and frequency.  Musculoskeletal: Negative for arthralgias, joint swelling and myalgias.  Skin: Negative for rash.  Neurological: Negative for dizziness, syncope, weakness, light-headedness and  numbness.  Psychiatric/Behavioral: Negative for behavioral problems, confusion and sleep disturbance.      Immunization History  Administered Date(s) Administered  . Influenza-Unspecified 10/06/2016  . Pneumococcal Conjugate-13 08/24/2016   Pertinent  Health Maintenance Due  Topic Date Due  . DEXA SCAN  11/13/2016 (Originally [birth year]1987)  . PNA vac Low Risk Adult (2 of 2 - PPSV23) 08/24/2017  . INFLUENZA VACCINE  Completed   No flowsheet data found. Functional Status Survey:    Vitals:   10/26/16 0906  BP: 133/74  Pulse: (!) 56  Resp: 16  Temp: 97.7 F (36.5 C)  TempSrc: Oral  SpO2: 97%  Weight: 122 lb 3.2 oz (55.4 kg)  Height: 5\' 5"  (1.651 m)   Body mass index is 20.34 kg/m. Physical Exam  Constitutional: She is oriented to person, place, and time. She appears well-developed and well-nourished.  HENT:  Head: Normocephalic.  Mouth/Throat: Oropharynx is clear and moist.  Eyes: Pupils are equal, round, and reactive to light.  Neck: Neck supple.  Cardiovascular: Normal rate and normal heart sounds.   Pulmonary/Chest: Effort normal and breath sounds normal. No respiratory distress. She has no wheezes. She has no rales.  Abdominal: Soft. Bowel sounds are normal. She exhibits no distension. There is no tenderness. There is no rebound.  Musculoskeletal:  Mild Edema Bilateral.  Neurological: She is alert and oriented to person, place, and time.  Skin: Skin is warm and dry.  Psychiatric: She has a normal mood and affect. Her behavior is normal. Thought content normal.    Labs reviewed:  Recent Labs  05/12/16 0700  07/11/16 0800 07/18/16 0740 09/19/16 0500  NA 140  < > 142 140 140  K 3.9  < > 3.9 3.7 3.8  CL 103  < > 106 103 103  CO2 29  < > 28 28 28   GLUCOSE 90  < > 79 84 80  BUN 33*  < > 37* 37* 34*  CREATININE 1.30*  < > 1.35* 1.43* 1.33*  CALCIUM 8.9  < > 9.2 8.7* 9.0  MG 2.1  --   --   --   --   < > = values in this interval not displayed.  Recent  Labs  01/17/16 1718 03/07/16 0600 05/12/16 0700  AST 22 13* 19  ALT <5* 10* 15  ALKPHOS 95 78 97  BILITOT 0.5 0.5 0.3  PROT 7.0 6.5 7.3  ALBUMIN 2.9* 2.9* 3.6    Recent Labs  04/10/16 0700 05/12/16 0700 07/03/16 0500  WBC 4.1 7.4 4.6  NEUTROABS 2.5 5.0 2.3  HGB 9.9* 11.6* 11.3*  HCT 31.7* 36.8 36.5  MCV 86.4 82.9 83.9  PLT 317 327 306   Lab Results  Component Value Date   TSH 11.992 (H) 10/25/2016   No results found for: HGBA1C Lab Results  Component Value Date   CHOL 145 09/19/2016   HDL 41 09/19/2016   LDLCALC 81 09/19/2016   TRIG 116 09/19/2016   CHOLHDL 3.5 09/19/2016    Significant Diagnostic Results in last 30 days:  No results found.  Assessment/Plan PAOD (peripheral arterial occlusive disease)  Patient doing well. Her Ulcers which were due to Venous stasis and PAD have healed well.She is on Asprin, Statin and Lasix  Essential hypertension BP Stable Will Decrease the dose of Norvasc to 5 mg as she has this edema in her leg and see if it helps. Will follow BP closely.  Coronary artery disease Stable on Aspirin, Statin    Paroxysmal atrial fibrillation  Per Cardiology she is NSR after DCCV and on Amoidaron that's why  never been on anticoagulation  Hypothyroidism TSH still slightly high. Will increase Symnthyroid  Chronic renal insufficiency, stage 3 (moderate)  Creat Stable  Bilateral leg edema Decrease Norvasc Consider another Antihypertensive if BP incrases  Hyperlipidemia LDL 81 in 09/18  Iron deficiency anemia,With transferrin Sat less then 20 Hgb Stable on Iron supplement  GERD Will Change Prilosec to Zantac    Family/ staff Communication:   Labs/tests ordered:  Repeat TSH and CBC CMP in 6 weeks.

## 2016-11-13 NOTE — Progress Notes (Signed)
This encounter was created in error - please disregard.

## 2016-11-16 ENCOUNTER — Ambulatory Visit: Payer: Medicare Other | Admitting: Family

## 2016-11-16 ENCOUNTER — Encounter (HOSPITAL_COMMUNITY): Payer: Medicare Other

## 2016-11-30 ENCOUNTER — Encounter (HOSPITAL_COMMUNITY)
Admission: RE | Admit: 2016-11-30 | Discharge: 2016-11-30 | Disposition: A | Discharge status: Home / Self Care | Payer: Medicare Other | Source: Skilled Nursing Facility | Attending: Internal Medicine | Admitting: Internal Medicine

## 2016-11-30 DIAGNOSIS — K219 Gastro-esophageal reflux disease without esophagitis: Secondary | ICD-10-CM | POA: Diagnosis present

## 2016-11-30 DIAGNOSIS — I739 Peripheral vascular disease, unspecified: Secondary | ICD-10-CM | POA: Insufficient documentation

## 2016-11-30 DIAGNOSIS — I5022 Chronic systolic (congestive) heart failure: Secondary | ICD-10-CM | POA: Insufficient documentation

## 2016-11-30 DIAGNOSIS — Z48812 Encounter for surgical aftercare following surgery on the circulatory system: Secondary | ICD-10-CM | POA: Insufficient documentation

## 2016-11-30 LAB — COMPREHENSIVE METABOLIC PANEL
ALT: 17 U/L (ref 14–54)
AST: 22 U/L (ref 15–41)
Albumin: 3.7 g/dL (ref 3.5–5.0)
Alkaline Phosphatase: 100 U/L (ref 38–126)
Anion gap: 11 (ref 5–15)
BUN: 35 mg/dL — AB (ref 6–20)
CALCIUM: 9.1 mg/dL (ref 8.9–10.3)
CO2: 26 mmol/L (ref 22–32)
CREATININE: 1.26 mg/dL — AB (ref 0.44–1.00)
Chloride: 103 mmol/L (ref 101–111)
GFR calc Af Amer: 41 mL/min — ABNORMAL LOW (ref >60)
GFR calc non Af Amer: 35 mL/min — ABNORMAL LOW (ref >60)
GLUCOSE: 78 mg/dL (ref 65–99)
Potassium: 3.8 mmol/L (ref 3.5–5.1)
SODIUM: 140 mmol/L (ref 135–145)
Total Bilirubin: 0.5 mg/dL (ref 0.3–1.2)
Total Protein: 7.2 g/dL (ref 6.5–8.1)

## 2016-11-30 LAB — CBC WITH DIFFERENTIAL/PLATELET
BASOS ABS: 0 10*3/uL (ref 0.0–0.1)
BASOS PCT: 1 %
EOS ABS: 0.3 10*3/uL (ref 0.0–0.7)
EOS PCT: 6 %
HCT: 48.7 % — ABNORMAL HIGH (ref 36.0–46.0)
HEMOGLOBIN: 15.6 g/dL — AB (ref 12.0–15.0)
Lymphocytes Relative: 32 %
Lymphs Abs: 1.4 10*3/uL (ref 0.7–4.0)
MCH: 30.6 pg (ref 26.0–34.0)
MCHC: 32 g/dL (ref 30.0–36.0)
MCV: 95.5 fL (ref 78.0–100.0)
Monocytes Absolute: 0.5 10*3/uL (ref 0.1–1.0)
Monocytes Relative: 10 %
NEUTROS PCT: 51 %
Neutro Abs: 2.3 10*3/uL (ref 1.7–7.7)
PLATELETS: 275 10*3/uL (ref 150–400)
RBC: 5.1 MIL/uL (ref 3.87–5.11)
RDW: 14.6 % (ref 11.5–15.5)
WBC: 4.5 10*3/uL (ref 4.0–10.5)

## 2016-11-30 LAB — TSH: TSH: 1.269 u[IU]/mL (ref 0.350–4.500)

## 2016-12-08 ENCOUNTER — Encounter: Payer: Self-pay | Admitting: Internal Medicine

## 2016-12-08 ENCOUNTER — Non-Acute Institutional Stay (SKILLED_NURSING_FACILITY): Payer: Medicare Other | Admitting: Internal Medicine

## 2016-12-08 DIAGNOSIS — I779 Disorder of arteries and arterioles, unspecified: Secondary | ICD-10-CM

## 2016-12-08 DIAGNOSIS — I480 Paroxysmal atrial fibrillation: Secondary | ICD-10-CM | POA: Diagnosis not present

## 2016-12-08 DIAGNOSIS — I2510 Atherosclerotic heart disease of native coronary artery without angina pectoris: Secondary | ICD-10-CM | POA: Diagnosis not present

## 2016-12-08 DIAGNOSIS — N183 Chronic kidney disease, stage 3 unspecified: Secondary | ICD-10-CM

## 2016-12-08 DIAGNOSIS — E039 Hypothyroidism, unspecified: Secondary | ICD-10-CM

## 2016-12-08 DIAGNOSIS — D509 Iron deficiency anemia, unspecified: Secondary | ICD-10-CM

## 2016-12-08 NOTE — Progress Notes (Signed)
Location:   [facility] Room Number: 111/W Place of Service:  SNF [phone]) Provider:  Candida Peeling, Freddie Breech, MD  Patient Care Team: Mahlon Gammon, MD as PCP - General (Internal Medicine) Wyline Mood [name], MD as Consulting Physician (Cardiology) Synetta Shadow as Physician Assistant (Internal Medicine)  Extended Emergency Contact Information Primary Emergency Contact: Casimer Lanius [place] Home Phone: [phone] Mobile Phone: [phone] Relation: None Secondary Emergency Contact: Margarito Courser [place] Home Phone: [phone] Relation: None  Code Status:  DNR Goals of care: Advanced Directive information Advanced Directives 12/08/2016  Does Patient Have a Medical Advance Directive? Yes  Type of Advance Directive Out of facility DNR (pink MOST or yellow form)  Does patient want to make changes to medical advance directive? No - Patient declined  Copy of Healthcare Power of Attorney in Chart? No - copy requested  Would patient like information on creating a medical advance directive? No - Patient declined     Chief complaint chronic medical issues including peripheral arterial disease-coronary artery disease-CHF-A. fib-hypertension- hyperlipidemia-hypothyroidism- GERD-anemia-  HPI:  Pt is a 83 y.o. female seen today for medical management of chronic diseases.-She continues to be quite stable with her weight is stable at around 119 pounds.  She does have a history of peripheral arterial disease status post right great toe amputation and in January had a peroneal arthrectomy  She also has bilateral heel wounds which have largely resolved I did speak with the wound care physician assistant today in the facility said that the wound looked very good and largely resolved.  She is doing well with supportive care apparently has a decent appetite.  Regards to atrial fibrillation this is rate controlled on  amiodarone she is on aspirin for anticoagulation.  She also has a history of grade 2 diastolic dysfunction per echo in January 2018 she is on Lasix and appears to be doing well on this regards without symptoms of shortness of breath or chest pain.  Despite being on the Lasix her kidney function appears to be relatively stable with a creatinine of 1.26.  She also has a history of anemia thought to have an element of iron deficiency actually level was elevated at 15.6 on recent lab and we have discontinued the iron and updated lab is pending.  Regards her hypertension this is stable blood pressure 130/67-127/75- she is on Norvasc 5 mg a day as well as Lasix.  Regards her hypothyroidism she continues on Synthroid TSH is 1.269 which shows stability  Currently she has no complaints vital signs are stable as well as weight.    Past Medical History:  Diagnosis Date  . Acid reflux   . Carotid artery occlusion    minimal  . Coronary artery disease   . Hypercholesteremia   . Hypertension   . Myocardial infarct (HCC)   . PAF (paroxysmal atrial fibrillation) (HCC)   . Skin disorder   . Systolic murmur   . Thyroid disease    Past Surgical History:  Procedure Laterality Date  . ABDOMINAL HYSTERECTOMY    . AMPUTATION Right 01/05/2016   Procedure: RIGHT GREAT TOE AMPUTATION;  Surgeon: Nada Libman, MD;  Location: [facility] OR;  Service: Vascular;  Laterality: Right;  . CARDIAC CATHETERIZATION    . CARDIOVERSION  2012  . IR GENERIC HISTORICAL  12/30/2015   IR ANGIOGRAM EXTREMITY BILATERAL 12/30/2015 [facility]  . IR GENERIC HISTORICAL  12/30/2015   IR ANGIOGRAM SELECTIVE  EACH ADDITIONAL VESSEL 12/30/2015 [facility]  . LOWER EXTREMITY ANGIOGRAM Right 12/30/2015   Procedure: RIGHT LOWER EXTREMITY ANGIOGRAM;  Surgeon: Ancil LinseyJason Evan Davis, MD;  Location: [facility];  Service: Vascular;  Laterality: Right;  accessed through left groin area - dressed with two 2x2 and 4x4 opsite  . PERIPHERAL  VASCULAR CATHETERIZATION N/A 01/04/2016   Procedure: Lower Extremity Angiography;  Surgeon: Nada LibmanVance W Brabham, MD;  [facility];  Service: Cardiovascular;  Laterality: N/A;  . PERIPHERAL VASCULAR CATHETERIZATION Right 01/04/2016   Procedure: Peripheral Vascular Atherectomy;  Surgeon: Nada LibmanVance W Brabham, MD;  Location: [facility] INVASIVE CV LAB;  Service: Cardiovascular;  Laterality: Right;  Peroneal  . PERIPHERAL VASCULAR CATHETERIZATION N/A 02/01/2016   Procedure: Abdominal Aortogram w/Lower Extremity;  Surgeon: Nada LibmanVance W Brabham, MD;  Location: [facility] INVASIVE CV LAB;  Service: Cardiovascular;  Laterality: N/A;  . PERIPHERAL VASCULAR CATHETERIZATION  02/01/2016   Procedure: Peripheral Vascular Atherectomy;  Surgeon: Nada LibmanVance W Brabham, MD;  Location: [facility] INVASIVE CV LAB;  Service: Cardiovascular;;  Left   . THYROID SURGERY      No Known Allergies  Outpatient Encounter Medications as of 12/08/2016  Medication Sig  . acetaminophen (TYLENOL) 325 MG tablet Take 650 mg by mouth every 4 (four) hours as needed.   . Amino Acids-Protein Hydrolys (FEEDING SUPPLEMENT, PRO-STAT SUGAR FREE 64,) LIQD Take 30 mLs by mouth 2 (two) times daily between meals.  Marland Kitchen. amiodarone (PACERONE) 200 MG tablet Take 0.5 tablets (100 mg total) by mouth daily.  Marland Kitchen. amLODipine (NORVASC) 10 MG tablet Take 5 mg by mouth daily.   Marland Kitchen. aspirin EC 81 MG tablet Take 1 tablet (81 mg total) by mouth daily.  Marland Kitchen. atorvastatin (LIPITOR) 20 MG tablet Take 20 mg by mouth every other day. Take 1 tablet (20 mg) by mouth every other night  . docusate sodium (COLACE) 100 MG capsule Take 100 mg by mouth daily.  . furosemide (LASIX) 20 MG tablet Give 1 tablet by mouth daily and prn as needed  . levothyroxine (SYNTHROID, LEVOTHROID) 125 MCG tablet Take 125 mcg by mouth daily before breakfast.  . ranitidine (ZANTAC) 150 MG tablet Take 150 mg by mouth 2 (two) times daily.  Marland Kitchen. senna (SENOKOT) 8.6 MG TABS tablet Take 2 tablets by mouth daily.   . [DISCONTINUED]  ferrous sulfate (QC FERROUS SULFATE) 325 (65 FE) MG tablet Take 325 mg by mouth daily with breakfast.  . [DISCONTINUED] levothyroxine (SYNTHROID, LEVOTHROID) 100 MCG tablet Take 100 mcg by mouth daily before breakfast.  . [DISCONTINUED] omeprazole (PRILOSEC) 40 MG capsule Take 40 mg by mouth at bedtime.   No facility-administered encounter medications on file as of 12/08/2016.      Review of Systems   In general she is not complaining of any fever or chills her weight is stable.  Her skin is warm and dry again bilateral heel ulcers appear to have essentially healed.  Head ears eyes nose mouth and throat is not complaining of any sore throat or difficulty swallowing.  He does have prescription lenses.  Respiratory is not complaining of shortness of breath or cough.  Cardiac denies chest pain he has some chronic lower extremity edema which appears relatively stable.  GI is not complaining of nausea vomiting diarrhea constipation.  Musculoskeletal is not complaining of joint pain currently.  Neurologic does not complain of dizziness headache or syncope.  Psych no behaviors have been noted continues to be pleasant cooperative does not show signs of any depression or anxiety but does  have some mild cognitive deficits confusion at times per nursing also on exam was in the low 60s  Immunization History  Administered Date(s) Administered  . Influenza-Unspecified 10/06/2016  . Pneumococcal Conjugate-13 08/24/2016  . Tdap 09/14/2016   Pertinent  Health Maintenance Due  Topic Date Due  . DEXA SCAN  01/08/2017 (Originally 02/09/1985)  . PNA vac Low Risk Adult (2 of 2 - PPSV23) 08/24/2017  . INFLUENZA VACCINE  Completed   No flowsheet data found. Functional Status Survey:    Vitals:   12/08/16 1002  BP: 130/67  Pulse: (!) 57  Resp: 18  Temp: (!) 96.5 F (35.8 C)  TempSrc: Oral  SpO2: 99%  Weight: 119 lb 6.4 oz (54.2 kg)  Height: 5\' 5"  (1.651 m)  Pulse on exam was in the low  60s Body mass index is 19.87 kg/m. Physical Exam   In general this is a pleasant elderly female who looks younger than her stated age.  Her skin is warm and dry does have some hypertrophic areas on the right heel but I do not really see signs of any overt infection of either heel.  Eyes she has prescription lenses sclera and conjunctive are clear visual acuity appears grossly intact.  Oropharynx is clear mucous membranes moist.  Chest is clear to auscultation there is no labored breathing.  Heart is regular irregular rate and rhythm without murmur gallop or rub she has quite mild lower extremity edema.  Her abdomen is soft nontender with positive bowel sounds.  Musculoskeletal is able to move all extremities for at baseline.  Neurologic is grossly intact her speech is clear no lateralizing findings.  Psych she appears to be alert and oriented pleasant and conversant does at times have some mild cognitive  deficits  Labs reviewed: Recent Labs    05/12/16 0700  07/18/16 0740 09/19/16 0500 11/30/16 0748  NA 140   < > 140 140 140  K 3.9   < > 3.7 3.8 3.8  CL 103   < > 103 103 103  CO2 29   < > 28 28 26   GLUCOSE 90   < > 84 80 78  BUN 33*   < > 37* 34* 35*  CREATININE 1.30*   < > 1.43* 1.33* 1.26*  CALCIUM 8.9   < > 8.7* 9.0 9.1  MG 2.1  --   --   --   --    < > = values in this interval not displayed.   Recent Labs    03/07/16 0600 05/12/16 0700 11/30/16 0748  AST 13* 19 22  ALT 10* 15 17  ALKPHOS 78 97 100  BILITOT 0.5 0.3 0.5  PROT 6.5 7.3 7.2  ALBUMIN 2.9* 3.6 3.7   Recent Labs    05/12/16 0700 07/03/16 0500 11/30/16 0748  WBC 7.4 4.6 4.5  NEUTROABS 5.0 2.3 2.3  HGB 11.6* 11.3* 15.6*  HCT 36.8 36.5 48.7*  MCV 82.9 83.9 95.5  PLT 327 306 275   Lab Results  Component Value Date   TSH 1.269 11/30/2016   No results found for: HGBA1C Lab Results  Component Value Date   CHOL 145 09/19/2016   HDL 41 09/19/2016   LDLCALC 81 09/19/2016   TRIG 116  09/19/2016   CHOLHDL 3.5 09/19/2016    Significant Diagnostic Results in last 30 days:  No results found.  Assessment/Plan  #1 history of peripheral arterial disease-she is followed closely by vascular as well as wound care heel wounds appear  to have largely healed she appears to be stable in this regards she is on aspirin.  2.  Coronary artery disease this appears asymptomatic she is on aspirin Plavix.  3.  Atrial fibrillation appears rate controlled on amiodarone she is on aspirin for anticoagulation not on aggressive anticoagulation secondary I suspect to advanced age fall as well as transient A. fib.  4.  Hypertension this appears stable she is on Norvasc as well as Lasix.  5.  History of hyperlipidemia she is on a statin-LDL was 81 in September 2018 she is on every other day dosing secondary to tolerance issues.  6.  History of hypothyroidism TSH was within normal range and on lab done late last month at 1.269 will monitor periodically.  7.  History of chronic kidney disease this is stable with a creatinine of 1.26.  8.  Anemia this is quite stable at one point thought to have an element of iron deficiency however hemoglobin is 15.1 on lab done in late November this we have discontinued the iron and update a CBC in a couple weeks.  9.  History of grade 2 diastolic CHF again she is on Lasix weight appears to be stable clinical status appears to be stable.  10.  History of GERD she is on Zantac she has been switched from a proton pump inhibitor and appears down tolerated this well.   [id number]

## 2016-12-14 ENCOUNTER — Encounter (HOSPITAL_COMMUNITY)
Admission: RE | Admit: 2016-12-14 | Discharge: 2016-12-14 | Disposition: A | Discharge status: Home / Self Care | Payer: Medicare Other | Source: Skilled Nursing Facility | Attending: Internal Medicine | Admitting: Internal Medicine

## 2016-12-14 DIAGNOSIS — K219 Gastro-esophageal reflux disease without esophagitis: Secondary | ICD-10-CM | POA: Diagnosis present

## 2016-12-14 DIAGNOSIS — I5022 Chronic systolic (congestive) heart failure: Secondary | ICD-10-CM | POA: Diagnosis present

## 2016-12-14 DIAGNOSIS — I2510 Atherosclerotic heart disease of native coronary artery without angina pectoris: Secondary | ICD-10-CM | POA: Insufficient documentation

## 2016-12-14 DIAGNOSIS — I739 Peripheral vascular disease, unspecified: Secondary | ICD-10-CM | POA: Insufficient documentation

## 2016-12-14 LAB — CBC WITH DIFFERENTIAL/PLATELET
Basophils Absolute: 0 10*3/uL (ref 0.0–0.1)
Basophils Relative: 1 %
EOS ABS: 0.1 10*3/uL (ref 0.0–0.7)
Eosinophils Relative: 2 %
HEMATOCRIT: 49.1 % — AB (ref 36.0–46.0)
HEMOGLOBIN: 15.7 g/dL — AB (ref 12.0–15.0)
LYMPHS ABS: 1.4 10*3/uL (ref 0.7–4.0)
Lymphocytes Relative: 24 %
MCH: 31.2 pg (ref 26.0–34.0)
MCHC: 32 g/dL (ref 30.0–36.0)
MCV: 97.4 fL (ref 78.0–100.0)
MONOS PCT: 8 %
Monocytes Absolute: 0.5 10*3/uL (ref 0.1–1.0)
NEUTROS PCT: 65 %
Neutro Abs: 3.8 10*3/uL (ref 1.7–7.7)
Platelets: 281 10*3/uL (ref 150–400)
RBC: 5.04 MIL/uL (ref 3.87–5.11)
RDW: 14.3 % (ref 11.5–15.5)
WBC: 5.8 10*3/uL (ref 4.0–10.5)

## 2017-01-08 ENCOUNTER — Encounter: Payer: Self-pay | Admitting: Family

## 2017-01-08 ENCOUNTER — Ambulatory Visit (INDEPENDENT_AMBULATORY_CARE_PROVIDER_SITE_OTHER): Payer: Medicare Other | Admitting: Family

## 2017-01-08 ENCOUNTER — Inpatient Hospital Stay (HOSPITAL_COMMUNITY)
Admit: 2017-01-08 | Discharge: 2017-01-08 | Disposition: A | Discharge status: Home / Self Care | Payer: Medicare Other | Attending: Family | Admitting: Family

## 2017-01-08 VITALS — BP 174/77 | HR 62 | Temp 97.3°F | Resp 18 | Ht 65.0 in | Wt 119.4 lb

## 2017-01-08 DIAGNOSIS — I83004 Varicose veins of unspecified lower extremity with ulcer of heel and midfoot: Secondary | ICD-10-CM

## 2017-01-08 DIAGNOSIS — Z89411 Acquired absence of right great toe: Secondary | ICD-10-CM | POA: Diagnosis not present

## 2017-01-08 DIAGNOSIS — L97402 Non-pressure chronic ulcer of unspecified heel and midfoot with fat layer exposed: Secondary | ICD-10-CM

## 2017-01-08 DIAGNOSIS — Z9862 Peripheral vascular angioplasty status: Secondary | ICD-10-CM | POA: Diagnosis not present

## 2017-01-08 DIAGNOSIS — I779 Disorder of arteries and arterioles, unspecified: Secondary | ICD-10-CM

## 2017-01-08 NOTE — Patient Instructions (Addendum)
Venous Ulcer A venous ulcer is a shallow sore on your lower leg. It is caused by poor circulation in your veins. Venous ulcer is the most common type of lower leg ulcer. You may have venous ulcers on one leg or on both legs. This condition most often develops around your ankles. This type of ulcer may last for a long time (chronic ulcer) or it may return often (recurrent ulcer). Follow these instructions at home: Wound care  Follow instructions from your doctor about: ? How to take care of your wound. ? When and how you should change your bandage (dressing). ? When you should remove your bandage. If your bandage is dry and gets stuck to your leg when you try to remove it, moisten or wet the bandage with saline solution or water. This helps you to remove it without harming your skin or wound.  Check your wound every day for signs of infection. Have a caregiver do this for you if you are not able to do it yourself. Watch for: ? More redness, swelling, or pain. ? More fluid or blood. ? Pus, warmth, or a bad smell. Medicines  Take over-the-counter and prescription medicines only as told by your doctor.  If you were prescribed an antibiotic medicine, take it or apply it as told by your doctor. Do not stop taking or using the antibiotic even if your condition improves. Activity  Do not stand or sit in one position for a long period of time. Rest with your legs raised during the day. If possible, keep your legs above your heart for 30 minutes, 3-4 times a day, or as told by your doctor.  Do not sit with your legs crossed.  Walk often to increase the blood flow in your legs. Ask your doctor what level of activity is safe for you.  If you are taking a long ride in a car or plane, take a break to walk around at least once every two hours, or as told by your doctor. Ask your doctor if you should take aspirin before long trips. General instructions   Wear elastic stockings, compression stockings,  or support hose as told by your doctor. This is very important.  Raise the foot of your bed as told by your doctor.  Do not smoke.  Keep all follow-up visits as told by your doctor. This is important. Contact a doctor if:  You have a fever.  Your ulcer is getting larger or is not healing.  Your pain gets worse.  You have more redness or swelling around your ulcer.  You have more fluid, blood, or pus coming from your ulcer after it has been cleaned by you or your doctor.  You have warmth or a bad smell coming from your ulcer. This information is not intended to replace advice given to you by your health care provider. Make sure you discuss any questions you have with your health care provider. Document Released: 01/27/2004 Document Revised: 05/27/2015 Document Reviewed: 04/29/2014 Elsevier Interactive Patient Education  2018 [employer]     Peripheral Vascular Disease Peripheral vascular disease (PVD) is a disease of the blood vessels that are not part of your heart and brain. A simple term for PVD is poor circulation. In most cases, PVD narrows the blood vessels that carry blood from your heart to the rest of your body. This can result in a decreased supply of blood to your arms, legs, and internal organs, like your stomach or kidneys. However, it   most often affects a person's lower legs and feet. There are two types of PVD.  Organic PVD. This is the more common type. It is caused by damage to the structure of blood vessels.  Functional PVD. This is caused by conditions that make blood vessels contract and tighten (spasm).  Without treatment, PVD tends to get worse over time. PVD can also lead to acute ischemic limb. This is when an arm or limb suddenly has trouble getting enough blood. This is a medical emergency. Follow these instructions at home:  Take medicines only as told by your doctor.  Do not use any tobacco products, including cigarettes, chewing tobacco, or  electronic cigarettes. If you need help quitting, ask your doctor.  Lose weight if you are overweight, and maintain a healthy weight as told by your doctor.  Eat a diet that is low in fat and cholesterol. If you need help, ask your doctor.  Exercise regularly. Ask your doctor for some good activities for you.  Take good care of your feet. ? Wear comfortable shoes that fit well. ? Check your feet often for any cuts or sores. Contact a doctor if:  You have cramps in your legs while walking.  You have leg pain when you are at rest.  You have coldness in a leg or foot.  Your skin changes.  You are unable to get or have an erection (erectile dysfunction).  You have cuts or sores on your feet that are not healing. Get help right away if:  Your arm or leg turns cold and blue.  Your arms or legs become red, warm, swollen, painful, or numb.  You have chest pain or trouble breathing.  You suddenly have weakness in your face, arm, or leg.  You become very confused or you cannot speak.  You suddenly have a very bad headache.  You suddenly cannot see. This information is not intended to replace advice given to you by your health care provider. Make sure you discuss any questions you have with your health care provider. Document Released: 03/15/2009 Document Revised: 05/27/2015 Document Reviewed: 05/29/2013 Elsevier Interactive Patient Education  2017 Elsevier Inc.  

## 2017-01-08 NOTE — Progress Notes (Signed)
[facility] [place]   CC: Follow up peripheral artery occlusive disease  History of Present Illness [name] is a 84 y.o. female who is s/p atherectomy right peroneal artery, angioplasty of the right peroneal artery and tibioperoneal trunk on 01/04/16 and then underwent right great toe amputation on 01/05/16 by Dr. Myra GianottiBrabham.  I last evaluated pt on 07-06-16. At that time bilateral heel and lateral right foot ulcers were granulating and contractingslowly. Ulcers are probably secondary to both venous stasis and peripheral artery disease.  She hadminimal edema in her lower legs and feet.  Daily seated leg and arm exercisesas discussed with pt and son and demonstrated, NH staff to supervise this daily. Stop Santyl; start daily Hydrogel dressing changes to feet ulcers.   [facility] is residing at [facility].She states that she is walking with a walker minimally as she needs to keep pressure off of her heels and feet, therefore cannot wear shoes, but over alldoing well. Physical therapy is supervising her walking.  She seems to be doing daily seated leg exercises as advised.  Pt denies fever or chills, denies pain.   Pt Diabetic: No Pt smoker: non-smoker  Pt meds include: Statin :Yes Betablocker: No ASA: Yes Other anticoagulants/antiplatelets: no     Past Medical History:  Diagnosis Date  . Acid reflux   . Carotid artery occlusion    minimal  . Coronary artery disease   . Hypercholesteremia   . Hypertension   . Myocardial infarct (HCC)   . PAF (paroxysmal atrial fibrillation) (HCC)   . Skin disorder   . Systolic murmur   . Thyroid disease     Social History Social History   Tobacco Use  . Smoking status: Never Smoker  . Smokeless tobacco: Never Used  Substance Use Topics  . Alcohol use: No    Alcohol/week: 0.0 oz  . Drug use: No    Family History Family History  Family history unknown: Yes    Past Surgical History:  Procedure  Laterality Date  . ABDOMINAL HYSTERECTOMY    . AMPUTATION Right 01/05/2016   Procedure: RIGHT GREAT TOE AMPUTATION;  Surgeon: Nada LibmanVance W Brabham, MD;  [facility];  Service: Vascular;  Laterality: Right;  . CARDIAC CATHETERIZATION    . CARDIOVERSION  2012  . IR GENERIC HISTORICAL  12/30/2015   IR ANGIOGRAM EXTREMITY BILATERAL 12/30/2015 [facility]  . IR GENERIC HISTORICAL  12/30/2015   IR ANGIOGRAM SELECTIVE EACH ADDITIONAL VESSEL 12/30/2015 [facility]  . LOWER EXTREMITY ANGIOGRAM Right 12/30/2015   Procedure: RIGHT LOWER EXTREMITY ANGIOGRAM;  Surgeon: Ancil LinseyJason Evan Davis, MD;  Location: [facility];  Service: Vascular;  Laterality: Right;  accessed through left groin area - dressed with two 2x2 and 4x4 opsite  . PERIPHERAL VASCULAR CATHETERIZATION N/A 01/04/2016   Procedure: Lower Extremity Angiography;  Surgeon: Nada LibmanVance W Brabham, MD;  [facility];  Service: Cardiovascular;  Laterality: N/A;  . PERIPHERAL VASCULAR CATHETERIZATION Right 01/04/2016   Procedure: Peripheral Vascular Atherectomy;  Surgeon: Nada LibmanVance W Brabham, MD;  Location: [facility] INVASIVE CV LAB;  Service: Cardiovascular;  Laterality: Right;  Peroneal  . PERIPHERAL VASCULAR CATHETERIZATION N/A 02/01/2016   Procedure: Abdominal Aortogram w/Lower Extremity;  Surgeon: Nada LibmanVance W Brabham, MD;  Location: [facility] INVASIVE CV LAB;  Service: Cardiovascular;  Laterality: N/A;  . PERIPHERAL VASCULAR CATHETERIZATION  02/01/2016   Procedure: Peripheral Vascular Atherectomy;  Surgeon: Nada LibmanVance W Brabham, MD;  Location: [facility] INVASIVE CV LAB;  Service: Cardiovascular;;  Left   .  THYROID SURGERY      No Known Allergies  No current outpatient medications on file.   No current facility-administered medications for this visit.     ROS: See HPI for pertinent positives and negatives.   Physical Examination  Vitals:   01/08/17 1158 01/08/17 1200  BP: (!) 152/75 (!) 174/77  Pulse: 62   Resp: 18   Temp: (!) 97.3 F (36.3 C)   TempSrc: Oral    SpO2: 99%   Weight: 119 lb 6.4 oz (54.2 kg)   Height: 5\' 5"  (1.651 m)    Body mass index is 19.87 kg/m.  General: A&O x 3, WDWN, thin elderly female that appears younger than her stated age. Gait:  seated in w/c Eyes: PERRLA. Pulmonary: Respirations are non labored, CTAB, good air movement in all fields Cardiac: regular rhythm and rate , no detected murmur.    Carotid Bruits Right Left   Negative Negative   Abdominal aortic pulse is notpalpable. Radial pulses: are 2+ palpable bilaterally   VASCULAR EXAM: Extremitieswithischemic changes, withoutGangrene; withopen wounds                 Right heel ulcer    Left heel ulcer    Right lateral foot ulcer             Left heel ulcer has healed with small shallow eschar (on 07-06-16 was 3.5cm x 2.5 cm),  Right posterior heel ulcer has healed. Right foot lateral ulcer continues to granulate and contract (see above photos) (was 2cm x 2cm, about 3-4 mm depth) Right great toe is surgically absent, this site is well healed.   LE Pulses Right Left  FEMORAL palpable palpable   POPLITEAL notpalpable  notpalpable  POSTERIOR TIBIAL notpalpable  notpalpable   DORSALIS PEDIS ANTERIOR TIBIAL notpalpable  notpalpable    Abdomen: soft, NT, no palpable masses. Skin: no rashes, see Extremities.  Musculoskeletal: no muscle wasting or atrophy. Neurologic: A&O X 3; Appropriate Affect ; SENSATION: normal; MOTOR FUNCTION: moving all extremities equally, motor strength 4/5 throughout. Speech is fluent/normal. CN 2-12 intact except is slightly hard of hearing    ASSESSMENT: [name] is a 84 y.o. female who is s/p atherectomy of right peroneal artery, angioplasty of the right peroneal artery and tibioperoneal trunk on 01/04/16 and then underwent right great toe amputation on 01/05/16.   Bilateral heel and lateral right foot ulcers  are granulating and contractingslowly, see Plan. The right posterior heel ulcer has heeled.  Ulcers are probably secondary to both venous stasis and peripheral artery disease.  She has no edema in her lower legs and feet.   Her feet ulcers are slowly heeling, this will continue to be a slow process. Walking with physical therapy is encouraged.   Kudos to [facility] staff for facilitating healing of the feet ulcers.   DATA  ABI (Date: 01/08/2017):  R:   ABI: 0.89 (was 0.87 on 08-17-16),   PT: none  DP: mono  TBI:  S/p great toe amputation  L:   ABI: 0.96 (was 0.83),   PT: mono  DP: mono  TBI: 0.85 (was 0.43) Stable on the right, and improved on the left, all monophasic waveforms except right PT is absent. ABI's may be falsely elevated.    Bilateral LE arterial Duplex (02-28-16): Technically difficult exam due to patient's inability to be properly positioned and calcific plaque throughout bilaterally preventing thorough visualization.  Unable to visualize a focal stenosis in the right LE, color flow disturbance noted  throughout, with doubling of velocity in the distal SFA suggestive of 50-74% stenosis. Unable to visualize a focal stenosis in the left LE, color flow disturbance noted throughout with an increased velocity in the mid SFA in the 50-77% range.  All waveforms are monophasic except bilateral common femoral artery which are biphasic.      PLAN: Continue daily seated leg and arm exercisesas discussed with pt and son and demonstrated, NH staff to supervise this daily. Continuedaily Hydrogel dressing changes to feet ulcers until fully granulated.  Keep pressure off heels.   Based on the patient's vascular studies and examination, pt will return to clinic in 6 monthswithbilateral feet ulcer evaluation and ABI's. May ambulate with physical therapy.   I discussed in depth with the patient the nature of atherosclerosis, and emphasized the importance of maximal medical  management including strict control of blood pressure, blood glucose, and lipid levels, obtaining regular exercise, and continued cessation of smoking.  The patient is aware that without maximal medical management the underlying atherosclerotic disease process will progress, limiting the benefit of any interventions.  The patient was given information about PAD including signs, symptoms, treatment, what symptoms should prompt the patient to seek immediate medical care, and risk reduction measures to take.  Charisse March, RN, MSN, FNP-C [facility] Phone: [phone]  Clinic MD: Myra Gianotti  01/08/17 12:08 PM

## 2017-01-09 NOTE — Addendum Note (Signed)
Addended by: Burton ApleyPETTY, Deondria Puryear A on: 01/09/2017 09:29 AM   Modules accepted: Orders

## 2017-01-16 ENCOUNTER — Encounter: Payer: Self-pay | Admitting: Internal Medicine

## 2017-01-16 ENCOUNTER — Non-Acute Institutional Stay (SKILLED_NURSING_FACILITY): Payer: Medicare Other | Admitting: Internal Medicine

## 2017-01-16 DIAGNOSIS — I10 Essential (primary) hypertension: Secondary | ICD-10-CM

## 2017-01-16 DIAGNOSIS — D509 Iron deficiency anemia, unspecified: Secondary | ICD-10-CM | POA: Diagnosis not present

## 2017-01-16 DIAGNOSIS — I779 Disorder of arteries and arterioles, unspecified: Secondary | ICD-10-CM

## 2017-01-16 DIAGNOSIS — I2510 Atherosclerotic heart disease of native coronary artery without angina pectoris: Secondary | ICD-10-CM | POA: Diagnosis not present

## 2017-01-16 DIAGNOSIS — E039 Hypothyroidism, unspecified: Secondary | ICD-10-CM | POA: Diagnosis not present

## 2017-01-16 DIAGNOSIS — N183 Chronic kidney disease, stage 3 unspecified: Secondary | ICD-10-CM

## 2017-01-16 DIAGNOSIS — I480 Paroxysmal atrial fibrillation: Secondary | ICD-10-CM

## 2017-01-16 NOTE — Progress Notes (Signed)
Location:   [facility] Room Number: 111/W Place of Service:  SNF [id number](31) Provider:  Sabino DickArlo, Tracye Szuch  Gupta, [name] L, MD  Patient Care Team: [name], [name] L, MD as PCP - General (Internal Medicine) [name], Dorothe PeaJonathan F, MD as Consulting Physician (Cardiology) Synetta ShadowLassen, Sheritha Louis C, PA-C as Physician Assistant (Internal Medicine)  Extended Emergency Contact Information Primary Emergency Contact: [name],[name]  [place] Home Phone: [phone] Mobile Phone: [phone] Relation: None Secondary Emergency Contact: [name],April  [place] Home Phone: [phone] Relation: None  Code Status: DNR Goals of care: Advanced Directive information Advanced Directives 01/16/2017  Does Patient Have a Medical Advance Directive? Yes  Type of Advance Directive Out of facility DNR (pink MOST or yellow form)  Does patient want to make changes to medical advance directive? No - Patient declined  Copy of Healthcare Power of Attorney in Chart? No - copy requested  Would patient like information on creating a medical advance directive? No - Patient declined    Chief complaint-routine visit for medical management of chronic medical issues including CHF-atrial fibrillation-hypertension-peripheral arterial disease-hyperlipidemia as well as hypothyroidism GERD and anemia   HPI:  Pt is a 84 y.o. female seen today for medical management of chronic diseases.   As noted above.  She continues to be quite stable she does have a history of peripheral arterial disease status post right great toe amputation- and a year ago had a peroneal arthrectomy--she is followed very closely by vascular physicians and is thought to be doing quite well the wounds on her heels as well as lateral right foot are largely resolved- in fact she just saw vascular recently and thought to be doing very well.  Her weight is stable at 121 pounds.  She does have a history atrial fibrillation  that is rate controlled on amiodarone she is on aspirin for anticoagulation this is not really been an issue.  She also has a history of grade 2 diastolic dysfunction this was according to an echo in January 2018-she is on Lasix and appears to be tolerating this well again weight is stable she does not really have significant edema does not complain of any chest pain or shortness of breath and her renal function appears relatively stable with a creatinine of 1.26 on lab done in late November.  And and this ihas been stable for some time.  In regards to her anemia history thought to have an element of iron deficiency however this has really stabilized in fact was 15.7 on lab done in December and her iron has been discontinued we will continue to monitor this periodically but this appears to have significantly stabilized.  She also has a history of hypertension she is on Norvasc 5 mg a day as well as Lasix blood pressures recently 137/62-139/66-135/70.  She also has a history of hypothyroidism TSH was 1.269 on lab done in late November she has had some variability here and will update this.  Currently she has no complaints continues to be pleasant and gregarious.        Past Medical History:  Diagnosis Date  . Acid reflux   . Carotid artery occlusion    minimal  . Coronary artery disease   . Hypercholesteremia   . Hypertension   . Myocardial infarct (HCC)   . PAF (paroxysmal atrial fibrillation) (HCC)   . Skin disorder   . Systolic murmur   . Thyroid disease    Past Surgical History:  Procedure Laterality Date  .  ABDOMINAL HYSTERECTOMY    . AMPUTATION Right 01/05/2016   Procedure: RIGHT GREAT TOE AMPUTATION;  Surgeon: Nada Libman, MD;  Location: [facility] OR;  Service: Vascular;  Laterality: Right;  . CARDIAC CATHETERIZATION    . CARDIOVERSION  2012  . IR GENERIC HISTORICAL  12/30/2015   IR ANGIOGRAM EXTREMITY BILATERAL 12/30/2015 [facility]  . IR GENERIC HISTORICAL   12/30/2015   IR ANGIOGRAM SELECTIVE EACH ADDITIONAL VESSEL 12/30/2015 [facility]  . LOWER EXTREMITY ANGIOGRAM Right 12/30/2015   Procedure: RIGHT LOWER EXTREMITY ANGIOGRAM;  Surgeon: Ancil Linsey, MD;  Location: [facility] OR[facility];  Service: Vascular;  Laterality: Right;  accessed through left groin area - dressed with two 2x2 and 4x4 opsite  . PERIPHERAL VASCULAR CATHETERIZATION N/A 01/04/2016   Procedure: Lower Extremity Angiography;  Surgeon: Nada Libman, MD;  Location: [facility] INVASIVE CV LAB;  Service: Cardiovascular;  Laterality: N/A;  . PERIPHERAL VASCULAR CATHETERIZATION Right 01/04/2016   Procedure: Peripheral Vascular Atherectomy;  Surgeon: Nada Libman, MD;  Location: [facility] INVASIVE CV LAB;  Service: Cardiovascular;  Laterality: Right;  Peroneal  . PERIPHERAL VASCULAR CATHETERIZATION N/A 02/01/2016   Procedure: Abdominal Aortogram w/Lower Extremity;  Surgeon: Nada Libman, MD;  Location: [facility] INVASIVE CV LAB;  Service: Cardiovascular;  Laterality: N/A;  . PERIPHERAL VASCULAR CATHETERIZATION  02/01/2016   Procedure: Peripheral Vascular Atherectomy;  Surgeon: Nada Libman, MD;  Location: [facility] INVASIVE CV LAB;  Service: Cardiovascular;;  Left   . THYROID SURGERY      No Known Allergies  Outpatient Encounter Medications as of 01/16/2017  Medication Sig  . acetaminophen (TYLENOL) 325 MG tablet Take 650 mg by mouth every 4 (four) hours as needed.   . Amino Acids-Protein Hydrolys (FEEDING SUPPLEMENT, PRO-STAT SUGAR FREE 64,) LIQD Take 30 mLs by mouth 2 (two) times daily between meals.  Marland Kitchen amiodarone (PACERONE) 200 MG tablet Take 0.5 tablets (100 mg total) by mouth daily.  Marland Kitchen amLODipine (NORVASC) 10 MG tablet Take 5 mg by mouth daily.   Marland Kitchen aspirin EC 81 MG tablet Take 1 tablet (81 mg total) by mouth daily.  Marland Kitchen atorvastatin (LIPITOR) 20 MG tablet Take 20 mg by mouth every other day. Take 1 tablet (20 mg) by mouth every other night  . docusate sodium (COLACE) 100 MG capsule Take 100 mg by mouth daily.   . furosemide (LASIX) 20 MG tablet Give 1 tablet by mouth daily and prn as needed  . levothyroxine (SYNTHROID, LEVOTHROID) 125 MCG tablet Take 125 mcg by mouth daily before breakfast.  . ranitidine (ZANTAC) 150 MG tablet Take 150 mg by mouth daily.   Marland Kitchen senna (SENOKOT) 8.6 MG TABS tablet Take 2 tablets by mouth daily.    No facility-administered encounter medications on file as of 01/16/2017.      Review of Systems   In general is not complaining of any fever chills weight appears to be stable.  Skin-she does have a history of lower extremity wounds which appear to have largely healed she is followed closely by vascular as well as nursing.  Head ears eyes nose mouth and throat does not complain of any sore throat difficulty swallowing nasal discharge or visual changes.  Continues with prescription lenses.  Respiratory does not complain of any shortness of breath or cough.  Cardiac denies any chest pain does not really have significant lower extremity edema.  GI does not complain of any nausea vomiting diarrhea constipation or abdominal discomfort.  GU denies dysuria.  Musculoskeletal does not complain of joint  pain.  Neurologic does not complain of any dizziness headache syncope at this time or numbness.  Psych does not complain of being anxious or depressed continues to be pleasant and gregarious with some mild cognitive deficits but does very well with supportive care  Immunization History  Administered Date(s) Administered  . Influenza-Unspecified 10/06/2016  . Pneumococcal Conjugate-13 08/24/2016  . Tdap 09/14/2016   Pertinent  Health Maintenance Due  Topic Date Due  . DEXA SCAN  02/16/2017 (Originally 02/09/1985)  . PNA vac Low Risk Adult (2 of 2 - PPSV23) 08/24/2017  . INFLUENZA VACCINE  Completed   No flowsheet data found. Functional Status Survey:    Vitals:   01/16/17 1452  BP: 137/62  Pulse: 74  Resp: 20  Temp: 98 F (36.7 C)  TempSrc: Oral  SpO2: 99%    Weight: 121 lb 3.2 oz (55 kg)  Height: 5\' 5"  (1.651 m)   Body mass index is 20.17 kg/m. Physical Exam  In general this is a very pleasant elderly female in no distress sitting comfortably in her wheelchair.  Her skin is warm and dry she does have some hyper trophic areas on her heels bilaterally as well as some scabbing apparent on her lateral right foot again these wounds appear to be largely healed and followed by vascular.  Eyes she has prescription lenses sclera and conjunctive are clear visual acuity appears intact.  Oropharynx is clear mucous membranes moist.  Chest is clear to auscultation there is no labored breathing.  Heart is regular rate and rhythm today without murmur gallop or rub she does not really have significant lower extremity edema.  Her abdomen is soft nontender with positive bowel sounds.  Musculoskeletal does move all extremities x4 at baseline and ambulates in wheelchair.--She is status post amputation of right great toe  Neurologic is grossly intact her speech is clear no lateralizing findings.  Psych she is largely alert and oriented pleasant and appropriate has some mild confusion at times but this appears to be quite minor.    Labs reviewed: Recent Labs    05/12/16 0700  07/18/16 0740 09/19/16 0500 11/30/16 0748  NA 140   < > 140 140 140  K 3.9   < > 3.7 3.8 3.8  CL 103   < > 103 103 103  CO2 29   < > 28 28 26   GLUCOSE 90   < > 84 80 78  BUN 33*   < > 37* 34* 35*  CREATININE 1.30*   < > 1.43* 1.33* 1.26*  CALCIUM 8.9   < > 8.7* 9.0 9.1  MG 2.1  --   --   --   --    < > = values in this interval not displayed.   Recent Labs    03/07/16 0600 05/12/16 0700 11/30/16 0748  AST 13* 19 22  ALT 10* 15 17  ALKPHOS 78 97 100  BILITOT 0.5 0.3 0.5  PROT 6.5 7.3 7.2  ALBUMIN 2.9* 3.6 3.7   Recent Labs    07/03/16 0500 11/30/16 0748 12/14/16 1000  WBC 4.6 4.5 5.8  NEUTROABS 2.3 2.3 3.8  HGB 11.3* 15.6* 15.7*  HCT 36.5 48.7* 49.1*   MCV 83.9 95.5 97.4  PLT 306 275 281   Lab Results  Component Value Date   TSH 1.269 11/30/2016   No results found for: HGBA1C Lab Results  Component Value Date   CHOL 145 09/19/2016   HDL 41 09/19/2016   LDLCALC 81 09/19/2016  TRIG 116 09/19/2016   CHOLHDL 3.5 09/19/2016    Significant Diagnostic Results in last 30 days:  No results found.  Assessment/Plan #1-history of atrial fibrillation this appears rate controlled on amiodarone she is on aspirin for anticoagulation- not on aggressive anticoagulation I suspect because of her advanced age and also the A. fib apparently is transient --rhythm was regular today.  2.-History of peripheral arterial disease followed by vascular and her wounds are doing very well and largely healed she does have follow-up scheduled with vascular she is attended to by nursing here as well.  3.  Coronary artery disease appears largely asymptomatic she is on aspirin as well  a statin.--LDL was 81 on September 09, 2016 lab.  4.  Hypertension this appears stable as noted above she is on Norvasc as well as low-dose Lasix.  5.  History of hyperlipidemia as noted above she is on a statin and LDL was 81 on September lab.  Her statin by the way is on every other day dosing because of tolerance issues.  6.  History of hypothyroidism TSH most recently was at normal range at 1.269 since there has been some variability will update this.  7.  History of chronic kidney disease appears relatively baseline with a creatinine of 1.26 on lab done in late November will monitor periodically.  8.  History of anemia this appears quite stabilized now with hemoglobin actually over 15 she has been taken off her iron because of this-again will monitor this periodically.  9.  History of diastolic CHF weight appears to be relatively stable she has gained a couple pounds over the past month but certainly not precipitous-she is on Lasix  10.  History of GERD this appears stable  on Zantac-at one point had been on proton pump inhibitor but this was changed to Zantac appears she has been asymptomatic.  [id number]

## 2017-01-17 ENCOUNTER — Other Ambulatory Visit (HOSPITAL_COMMUNITY)
Admission: RE | Admit: 2017-01-17 | Discharge: 2017-01-17 | Disposition: A | Discharge status: Home / Self Care | Payer: Medicare Other | Source: Skilled Nursing Facility | Attending: Internal Medicine | Admitting: Internal Medicine

## 2017-01-17 DIAGNOSIS — K219 Gastro-esophageal reflux disease without esophagitis: Secondary | ICD-10-CM | POA: Diagnosis not present

## 2017-01-17 DIAGNOSIS — I5022 Chronic systolic (congestive) heart failure: Secondary | ICD-10-CM | POA: Diagnosis present

## 2017-01-17 DIAGNOSIS — I739 Peripheral vascular disease, unspecified: Secondary | ICD-10-CM | POA: Insufficient documentation

## 2017-01-17 DIAGNOSIS — I2510 Atherosclerotic heart disease of native coronary artery without angina pectoris: Secondary | ICD-10-CM | POA: Diagnosis present

## 2017-01-17 LAB — TSH: TSH: 0.241 u[IU]/mL — AB (ref 0.350–4.500)

## 2017-02-16 ENCOUNTER — Encounter (HOSPITAL_COMMUNITY)
Admission: RE | Admit: 2017-02-16 | Discharge: 2017-02-16 | Disposition: A | Discharge status: Home / Self Care | Payer: Medicare Other | Source: Skilled Nursing Facility | Attending: Internal Medicine | Admitting: Internal Medicine

## 2017-02-16 DIAGNOSIS — I2510 Atherosclerotic heart disease of native coronary artery without angina pectoris: Secondary | ICD-10-CM | POA: Diagnosis present

## 2017-02-16 DIAGNOSIS — I5022 Chronic systolic (congestive) heart failure: Secondary | ICD-10-CM | POA: Insufficient documentation

## 2017-02-16 DIAGNOSIS — I739 Peripheral vascular disease, unspecified: Secondary | ICD-10-CM | POA: Diagnosis present

## 2017-02-16 DIAGNOSIS — K219 Gastro-esophageal reflux disease without esophagitis: Secondary | ICD-10-CM | POA: Insufficient documentation

## 2017-02-16 LAB — TSH: TSH: 1.4 u[IU]/mL (ref 0.350–4.500)

## 2017-02-20 ENCOUNTER — Non-Acute Institutional Stay (SKILLED_NURSING_FACILITY): Payer: Medicare Other | Admitting: Internal Medicine

## 2017-02-20 ENCOUNTER — Encounter: Payer: Self-pay | Admitting: Internal Medicine

## 2017-02-20 DIAGNOSIS — I10 Essential (primary) hypertension: Secondary | ICD-10-CM | POA: Diagnosis not present

## 2017-02-20 DIAGNOSIS — I779 Disorder of arteries and arterioles, unspecified: Secondary | ICD-10-CM | POA: Diagnosis not present

## 2017-02-20 DIAGNOSIS — I2510 Atherosclerotic heart disease of native coronary artery without angina pectoris: Secondary | ICD-10-CM | POA: Diagnosis not present

## 2017-02-20 DIAGNOSIS — I480 Paroxysmal atrial fibrillation: Secondary | ICD-10-CM | POA: Diagnosis not present

## 2017-02-20 DIAGNOSIS — N183 Chronic kidney disease, stage 3 unspecified: Secondary | ICD-10-CM

## 2017-02-20 DIAGNOSIS — E7800 Pure hypercholesterolemia, unspecified: Secondary | ICD-10-CM

## 2017-02-20 DIAGNOSIS — E039 Hypothyroidism, unspecified: Secondary | ICD-10-CM | POA: Diagnosis not present

## 2017-02-20 NOTE — Progress Notes (Signed)
Location:   [facility] Room Number: 111/W Place of Service:  SNF [phone](31) Provider:  Pollyann SavoyAnjali, [name]  [name], MD  Patient Care Team: [name], [name], MD as PCP - General (Internal Medicine) [name], Dorothe PeaJonathan F, MD as Consulting Physician (Cardiology) Synetta ShadowLassen, [name], PA-C as Physician Assistant (Internal Medicine)  Extended Emergency Contact Information Primary Emergency Contact: [name],[name]  [place] Home Phone: [phone] Mobile Phone: [phone] Relation: None Secondary Emergency Contact: [name],April  [place] Home Phone: [phone] Relation: None  Code Status:  DNR Goals of care: Advanced Directive information Advanced Directives 02/20/2017  Does Patient Have a Medical Advance Directive? Yes  Type of Advance Directive Out of facility DNR (pink MOST or yellow form)  Does patient want to make changes to medical advance directive? No - Patient declined  Copy of Healthcare Power of Attorney in Chart? No - copy requested  Would patient like information on creating a medical advance directive? No - Patient declined     Chief Complaint  Patient presents with  . Medical Management of Chronic Issues    Routine Visit    HPI:  Pt is a 84 y.o. female seen today for medical management of chronic diseases.   Patient has Patient has H/O CAD with Cath done in 2008 on Medical management, Paroxysmal Atrial Fibrillation Not on any Anticoagulation, Hypertension, Hyperlipidemia., PAD, Right Great Toe Amputation in 01/18 And left Peroneal Atherectomy.  Patient is doing well in facility. She follows with Vascular surgeon. And per their notes her LE Ulcers are due to Venous stasis and PAD. But they are heeling well and now she is on Hydrocolloid dressing. Her weight was slightly up today to 126 lbs . Her appetite is good. No New Nursing issues. Patietn was sitting comfortable in Wheelchair and had no Complains.  Past  Medical History:  Diagnosis Date  . Acid reflux   . Carotid artery occlusion    minimal  . Coronary artery disease   . Hypercholesteremia   . Hypertension   . Myocardial infarct (HCC)   . PAF (paroxysmal atrial fibrillation) (HCC)   . Skin disorder   . Systolic murmur   . Thyroid disease    Past Surgical History:  Procedure Laterality Date  . ABDOMINAL HYSTERECTOMY    . AMPUTATION Right 01/05/2016   Procedure: RIGHT GREAT TOE AMPUTATION;  Surgeon: Nada LibmanVance W Brabham, MD;  [facility];  Service: Vascular;  Laterality: Right;  . CARDIAC CATHETERIZATION    . CARDIOVERSION  2012  . IR GENERIC HISTORICAL  12/30/2015   IR ANGIOGRAM EXTREMITY BILATERAL 12/30/2015 [facility]  . IR GENERIC HISTORICAL  12/30/2015   IR ANGIOGRAM SELECTIVE EACH ADDITIONAL VESSEL 12/30/2015 [facility]  . LOWER EXTREMITY ANGIOGRAM Right 12/30/2015   Procedure: RIGHT LOWER EXTREMITY ANGIOGRAM;  Surgeon: Ancil LinseyJason Evan Davis, MD;  Location: [facility];  Service: Vascular;  Laterality: Right;  accessed through left groin area - dressed with two 2x2 and 4x4 opsite  . PERIPHERAL VASCULAR CATHETERIZATION N/A 01/04/2016   Procedure: Lower Extremity Angiography;  Surgeon: Nada LibmanVance W Brabham, MD;  [facility];  Service: Cardiovascular;  Laterality: N/A;  . PERIPHERAL VASCULAR CATHETERIZATION Right 01/04/2016   Procedure: Peripheral Vascular Atherectomy;  Surgeon: Nada LibmanVance W Brabham, MD;  Location: [facility] INVASIVE CV LAB;  Service: Cardiovascular;  Laterality: Right;  Peroneal  . PERIPHERAL VASCULAR CATHETERIZATION N/A 02/01/2016   Procedure: Abdominal Aortogram w/Lower Extremity;  Surgeon: Nada LibmanVance W Brabham, MD;  [facility];  Service: Cardiovascular;  Laterality: N/A;  . PERIPHERAL VASCULAR CATHETERIZATION  02/01/2016   Procedure: Peripheral Vascular Atherectomy;  Surgeon: Nada Libman, MD;  Location: [facility] INVASIVE CV LAB;  Service: Cardiovascular;;  Left   . THYROID SURGERY      No Known  Allergies  Outpatient Encounter Medications as of 02/20/2017  Medication Sig  . acetaminophen (TYLENOL) 325 MG tablet Take 650 mg by mouth every 4 (four) hours as needed.   . Amino Acids-Protein Hydrolys (FEEDING SUPPLEMENT, PRO-STAT SUGAR FREE 64,) LIQD Take 30 mLs by mouth 2 (two) times daily between meals.  Marland Kitchen amiodarone (PACERONE) 200 MG tablet Take 0.5 tablets (100 mg total) by mouth daily.  Marland Kitchen amLODipine (NORVASC) 10 MG tablet Take 5 mg by mouth daily.   Marland Kitchen aspirin EC 81 MG tablet Take 1 tablet (81 mg total) by mouth daily.  Marland Kitchen atorvastatin (LIPITOR) 20 MG tablet Take 20 mg by mouth every other day. Take 1 tablet (20 mg) by mouth every other night  . docusate sodium (COLACE) 100 MG capsule Take 100 mg by mouth daily.  . furosemide (LASIX) 20 MG tablet Give 1 tablet by mouth daily and prn as needed  . levothyroxine (SYNTHROID, LEVOTHROID) 100 MCG tablet Take 100 mcg by mouth daily before breakfast.  . ranitidine (ZANTAC) 150 MG tablet Take 150 mg by mouth daily.   Marland Kitchen senna (SENOKOT) 8.6 MG TABS tablet Take 2 tablets by mouth daily.   . [DISCONTINUED] levothyroxine (SYNTHROID, LEVOTHROID) 125 MCG tablet Take 125 mcg by mouth daily before breakfast.   No facility-administered encounter medications on file as of 02/20/2017.      Review of Systems  Review of Systems  Constitutional: Negative for activity change, appetite change, chills, diaphoresis, fatigue and fever.  HENT: Negative for mouth sores, postnasal drip, rhinorrhea, sinus pain and sore throat.   Respiratory: Negative for apnea, cough, chest tightness, shortness of breath and wheezing.   Cardiovascular: Negative for chest pain, palpitations and leg swelling.  Gastrointestinal: Negative for abdominal distention, abdominal pain, constipation, diarrhea, nausea and vomiting.  Genitourinary: Negative for dysuria and frequency.  Musculoskeletal: Negative for arthralgias, joint swelling and myalgias.  Skin: Negative for rash.    Neurological: Negative for dizziness, syncope, weakness, light-headedness and numbness.  Psychiatric/Behavioral: Negative for behavioral problems, confusion and sleep disturbance.     Immunization History  Administered Date(s) Administered  . Influenza-Unspecified 10/06/2016  . Pneumococcal Conjugate-13 08/24/2016  . Tdap 09/14/2016   Pertinent  Health Maintenance Due  Topic Date Due  . DEXA SCAN  03/20/2017 (Originally 02/09/1985)  . PNA vac Low Risk Adult (2 of 2 - PPSV23) 08/24/2017  . INFLUENZA VACCINE  Completed   No flowsheet data found. Functional Status Survey:    Vitals:   02/20/17 1042  BP: 118/63  Pulse: 81  Resp: 18  Temp: (!) 97.2 F (36.2 C)  TempSrc: Oral  SpO2: 99%  Weight: 126 lb 3.2 oz (57.2 kg)  Height: 5\' 5"  (1.651 m)   Body mass index is 21 kg/m. Physical Exam  Constitutional: She appears well-developed and well-nourished.  HENT:  Head: Normocephalic.  Mouth/Throat: Oropharynx is clear and moist.  Eyes: Pupils are equal, round, and reactive to light.  Neck: Neck supple.  Cardiovascular: Normal rate and normal heart sounds.  No murmur heard. Pulmonary/Chest: Effort normal and breath sounds normal. No respiratory distress. She has no wheezes.  Abdominal: Soft. Bowel sounds are normal. She exhibits no distension. There is no tenderness. There is  no rebound.  Musculoskeletal:  Mild edema Bilateral  Skin: Skin is warm and dry.  Psychiatric: She has a normal mood and affect. Her behavior is normal. Thought content normal.    Labs reviewed: Recent Labs    05/12/16 0700  07/18/16 0740 09/19/16 0500 11/30/16 0748  NA 140   < > 140 140 140  K 3.9   < > 3.7 3.8 3.8  CL 103   < > 103 103 103  CO2 29   < > 28 28 26   GLUCOSE 90   < > 84 80 78  BUN 33*   < > 37* 34* 35*  CREATININE 1.30*   < > 1.43* 1.33* 1.26*  CALCIUM 8.9   < > 8.7* 9.0 9.1  MG 2.1  --   --   --   --    < > = values in this interval not displayed.   Recent Labs     03/07/16 0600 05/12/16 0700 11/30/16 0748  AST 13* 19 22  ALT 10* 15 17  ALKPHOS 78 97 100  BILITOT 0.5 0.3 0.5  PROT 6.5 7.3 7.2  ALBUMIN 2.9* 3.6 3.7   Recent Labs    07/03/16 0500 11/30/16 0748 12/14/16 1000  WBC 4.6 4.5 5.8  NEUTROABS 2.3 2.3 3.8  HGB 11.3* 15.6* 15.7*  HCT 36.5 48.7* 49.1*  MCV 83.9 95.5 97.4  PLT 306 275 281   Lab Results  Component Value Date   TSH 1.400 02/16/2017   No results found for: HGBA1C Lab Results  Component Value Date   CHOL 145 09/19/2016   HDL 41 09/19/2016   LDLCALC 81 09/19/2016   TRIG 116 09/19/2016   CHOLHDL 3.5 09/19/2016    Significant Diagnostic Results in last 30 days:  No results found.  Assessment/Plan  PAOD (peripheral arterial occlusive disease)  Patient is doing well. Follows with Vascular On statin, Lasix and Aspirin  Essential hypertension BP Stable Coronary artery disease Stable Aspirin , Statin,   Paroxysmal atrial fibrillation She has been DCCV and on Amiodarone Only on Aspirin Hypothyroidism TSH Normal in 02/19  Chronic renal insufficiency, stage 3 (moderate)  Creat Stable   Hyperlipidemia On statin   IGERD On zantac    Family/ staff Communication:   Labs/tests ordered:     Total time spent in this patient care encounter was 25_ minutes; greater than 50% of the visit spent counseling patient, reviewing records , Labs and coordinating care for problems addressed at this encounter.

## 2017-03-01 ENCOUNTER — Encounter: Payer: Self-pay | Admitting: Internal Medicine

## 2017-03-06 NOTE — Progress Notes (Signed)
This encounter was created in error - please disregard.

## 2017-03-07 ENCOUNTER — Other Ambulatory Visit (HOSPITAL_COMMUNITY)
Admission: RE | Admit: 2017-03-07 | Discharge: 2017-03-07 | Disposition: A | Discharge status: Home / Self Care | Payer: Medicare Other | Source: Skilled Nursing Facility | Attending: Internal Medicine | Admitting: Internal Medicine

## 2017-03-07 ENCOUNTER — Non-Acute Institutional Stay (SKILLED_NURSING_FACILITY): Payer: Medicare Other | Admitting: Internal Medicine

## 2017-03-07 ENCOUNTER — Encounter: Payer: Self-pay | Admitting: Internal Medicine

## 2017-03-07 DIAGNOSIS — K08409 Partial loss of teeth, unspecified cause, unspecified class: Secondary | ICD-10-CM

## 2017-03-07 DIAGNOSIS — I10 Essential (primary) hypertension: Secondary | ICD-10-CM

## 2017-03-07 DIAGNOSIS — R4182 Altered mental status, unspecified: Secondary | ICD-10-CM | POA: Diagnosis not present

## 2017-03-07 LAB — CBC WITH DIFFERENTIAL/PLATELET
Basophils Absolute: 0 10*3/uL (ref 0.0–0.1)
Basophils Relative: 0 %
EOS ABS: 0.1 10*3/uL (ref 0.0–0.7)
EOS PCT: 2 %
HCT: 47.4 % — ABNORMAL HIGH (ref 36.0–46.0)
Hemoglobin: 15.3 g/dL — ABNORMAL HIGH (ref 12.0–15.0)
LYMPHS ABS: 1.3 10*3/uL (ref 0.7–4.0)
Lymphocytes Relative: 19 %
MCH: 31.5 pg (ref 26.0–34.0)
MCHC: 32.3 g/dL (ref 30.0–36.0)
MCV: 97.5 fL (ref 78.0–100.0)
MONOS PCT: 9 %
Monocytes Absolute: 0.6 10*3/uL (ref 0.1–1.0)
Neutro Abs: 4.7 10*3/uL (ref 1.7–7.7)
Neutrophils Relative %: 70 %
PLATELETS: 272 10*3/uL (ref 150–400)
RBC: 4.86 MIL/uL (ref 3.87–5.11)
RDW: 12.9 % (ref 11.5–15.5)
WBC: 6.7 10*3/uL (ref 4.0–10.5)

## 2017-03-07 LAB — COMPREHENSIVE METABOLIC PANEL WITH GFR
ALT: 15 U/L (ref 14–54)
AST: 22 U/L (ref 15–41)
Albumin: 3.7 g/dL (ref 3.5–5.0)
Alkaline Phosphatase: 117 U/L (ref 38–126)
Anion gap: 14 (ref 5–15)
BUN: 38 mg/dL — ABNORMAL HIGH (ref 6–20)
CO2: 28 mmol/L (ref 22–32)
Calcium: 9.2 mg/dL (ref 8.9–10.3)
Chloride: 98 mmol/L — ABNORMAL LOW (ref 101–111)
Creatinine, Ser: 1.2 mg/dL — ABNORMAL HIGH (ref 0.44–1.00)
GFR calc Af Amer: 43 mL/min — ABNORMAL LOW
GFR calc non Af Amer: 37 mL/min — ABNORMAL LOW
Glucose, Bld: 75 mg/dL (ref 65–99)
Potassium: 4 mmol/L (ref 3.5–5.1)
Sodium: 140 mmol/L (ref 135–145)
Total Bilirubin: 0.7 mg/dL (ref 0.3–1.2)
Total Protein: 7.2 g/dL (ref 6.5–8.1)

## 2017-03-07 LAB — TSH: TSH: 1.467 u[IU]/mL (ref 0.350–4.500)

## 2017-03-07 NOTE — Progress Notes (Signed)
Location:   [facility] Room Number: 111/W Place of Service:  SNF [phone] Provider:  Sabino DickArlo Giordana Weinheimer  Gupta, [name] L, MD  Patient Care Team: [name], [name] L, MD as PCP - General (Internal Medicine) [name], Dorothe PeaJonathan F, MD as Consulting Physician (Cardiology) Synetta ShadowLassen, Karl Knarr C, PA-C as Physician Assistant (Internal Medicine)  Extended Emergency Contact Information Primary Emergency Contact: [name],[name]  [place] Home Phone: [phone] Mobile Phone: [phone] Relation: None Secondary Emergency Contact: [name],April  [place] Home Phone: [phone] Relation: None  Code Status:  DNR Goals of care: Advanced Directive information Advanced Directives 03/07/2017  Does Patient Have a Medical Advance Directive? Yes  Type of Advance Directive Out of facility DNR (pink MOST or yellow form)  Does patient want to make changes to medical advance directive? No - Patient declined  Copy of Healthcare Power of Attorney in Chart? No - copy requested  Would patient like information on creating a medical advance directive? No - Patient declined    Chief complaint-nursing staff feels patient is not quite herself-   HPI:  Pt is a 84 y.o. female seen today for an acute visit for nursing concerns that she is just not quite herself this afternoon.  Patient herself denies any problems is she is resting in bed she is a little bit tired but says that is not unusual.  She is afebrile vital signs are stable although her blood pressure is somewhat elevated with a systolic of around 180.  She does have a history of hypertension she is on Norvasc 5 mg a day.  Her other medical issues include coronary artery disease with Done in 2008 this is medically managed-she is also got a history of A. fib not on anticoagulation is on amiodarone for rate control.  Also history of hyperlipidemia and peripheral arterial disease with a right great toe amputation back  in December of last year 2018.  She has at baseline been quite stable sitting often in her wheelchair in the hallway pleasant and interactive however nursing staff noted today that she wanted to go to bed which is unusual for her-.  Of note she did have numerous teeth removed earlier this week apparently tolerated the procedure well.  We have orderedstat labs and awaiting the results- the physical exam was fairly unremarkable however her blood pressure again was noted to be somewhat elevated  She is not complaining of any fever chills or increased weakness does not complain of any headache syncope chest pain or shortness of breath--she also denies dysuria or abdominal discomfort-  Past Medical History:  Diagnosis Date  . Acid reflux   . Carotid artery occlusion    minimal  . Coronary artery disease   . Hypercholesteremia   . Hypertension   . Myocardial infarct (HCC)   . PAF (paroxysmal atrial fibrillation) (HCC)   . Skin disorder   . Systolic murmur   . Thyroid disease    Past Surgical History:  Procedure Laterality Date  . ABDOMINAL HYSTERECTOMY    . AMPUTATION Right 01/05/2016   Procedure: RIGHT GREAT TOE AMPUTATION;  Surgeon: Nada LibmanVance W Brabham, MD;  [facility];  Service: Vascular;  Laterality: Right;  . CARDIAC CATHETERIZATION    . CARDIOVERSION  2012  . IR GENERIC HISTORICAL  12/30/2015   IR ANGIOGRAM EXTREMITY BILATERAL 12/30/2015 [facility]  . IR GENERIC HISTORICAL  12/30/2015   IR ANGIOGRAM SELECTIVE EACH ADDITIONAL VESSEL 12/30/2015 [facility]  . LOWER EXTREMITY ANGIOGRAM Right 12/30/2015  Procedure: RIGHT LOWER EXTREMITY ANGIOGRAM;  Surgeon: Ancil Linsey, MD;  Location: [facility] ORS;  Service: Vascular;  Laterality: Right;  accessed through left groin area - dressed with two 2x2 and 4x4 opsite  . PERIPHERAL VASCULAR CATHETERIZATION N/A 01/04/2016   Procedure: Lower Extremity Angiography;  Surgeon: Nada Libman, MD;  Location: [facility] INVASIVE CV LAB;  Service:  Cardiovascular;  Laterality: N/A;  . PERIPHERAL VASCULAR CATHETERIZATION Right 01/04/2016   Procedure: Peripheral Vascular Atherectomy;  Surgeon: Nada Libman, MD;  Location: [facility] INVASIVE CV LAB;  Service: Cardiovascular;  Laterality: Right;  Peroneal  . PERIPHERAL VASCULAR CATHETERIZATION N/A 02/01/2016   Procedure: Abdominal Aortogram w/Lower Extremity;  Surgeon: Nada Libman, MD;  Location: [facility] INVASIVE CV LAB;  Service: Cardiovascular;  Laterality: N/A;  . PERIPHERAL VASCULAR CATHETERIZATION  02/01/2016   Procedure: Peripheral Vascular Atherectomy;  Surgeon: Nada Libman, MD;  Location: [facility] INVASIVE CV LAB;  Service: Cardiovascular;;  Left   . THYROID SURGERY      No Known Allergies  Outpatient Encounter Medications as of 03/07/2017  Medication Sig  . acetaminophen (TYLENOL) 325 MG tablet Take 650 mg by mouth every 4 (four) hours as needed.   . Amino Acids-Protein Hydrolys (FEEDING SUPPLEMENT, PRO-STAT SUGAR FREE 64,) LIQD Take 30 mLs by mouth 2 (two) times daily between meals.  Marland Kitchen amiodarone (PACERONE) 200 MG tablet Take 0.5 tablets (100 mg total) by mouth daily.  Marland Kitchen amLODipine (NORVASC) 10 MG tablet Take 5 mg by mouth daily.   Marland Kitchen aspirin EC 81 MG tablet Take 1 tablet (81 mg total) by mouth daily.  Marland Kitchen atorvastatin (LIPITOR) 20 MG tablet Take 20 mg by mouth every other day. Take 1 tablet (20 mg) by mouth every other night  . docusate sodium (COLACE) 100 MG capsule Take 100 mg by mouth daily.  . furosemide (LASIX) 20 MG tablet Give 1 tablet by mouth daily and prn as needed  . HYDROcodone-acetaminophen (NORCO/VICODIN) 5-325 MG tablet Take 1 tablet by mouth every 6 (six) hours as needed for moderate pain.  Marland Kitchen levothyroxine (SYNTHROID, LEVOTHROID) 100 MCG tablet Take 100 mcg by mouth daily before breakfast.  . ranitidine (ZANTAC) 150 MG tablet Take 150 mg by mouth daily.   Marland Kitchen senna (SENOKOT) 8.6 MG TABS tablet Take 2 tablets by mouth daily.    No facility-administered encounter medications on  file as of 03/07/2017.     Review of Systems    in general she is not complaining of any fever chills or feeling ill  Skin is not complaining of itching she is not diaphoretic.  Head ears eyes nose mouth and throat has prescription lenses is not complain of any visual changes or sore throat.  Respiratory denies shortness of breath or cough.  Cardiac denies chest pain has scant lower extremity edema.  GI does not complain of abdominal discomfort nausea vomiting diarrhea constipation-says she did not eat a lot of lunch but that was because she got a regular plate and found it difficult to chew.  GU denies dysuria.  Musculoskeletal does not complain of increased weakness or joint pain.  Neurologic is not complaining of dizziness headache syncope or numbness or increased weakness.  And psych does not complain of being depressed- at times gets a bit anxious especially when being assessed for any possible problem which may explain some of the elevation in blood pressure readings  Immunization History  Administered Date(s) Administered  . Influenza-Unspecified 10/06/2016  . Pneumococcal Conjugate-13 08/24/2016  . Tdap 09/14/2016  Pertinent  Health Maintenance Due  Topic Date Due  . DEXA SCAN  03/20/2017 (Originally 02/09/1985)  . PNA vac Low Risk Adult (2 of 2 - PPSV23) 08/24/2017  . INFLUENZA VACCINE  Completed   No flowsheet data found. Functional Status Survey:    Vitals:   03/07/17 1534  BP: (!) 158/71  Pulse: 60  Resp: 18  Temp: 98.8 F (37.1 C)  TempSrc: Oral  SpO2: 100%  Of note initial manual blood pressure was 180/80  Physical Exam   In general this is a pleasant elderly female in no distress lying comfortably in bed.  Her skin is warm and dry.  Oropharynx she does have numerous extractions especially upper mouth-I do not see any bleeding or drainage.  Eyes she has prescription lenses visual acuity appears grossly intact pupils appear reactive to  light.  extraocular movements intact  Chest is clear to auscultation there is no labored breathing.  Heart is regular rate and rhythm without murmur gallop or rub she has quite minimal lower extremity edema.  Her abdomen is soft nontender with positive bowel sounds.  Musculoskeletal moves all extremities at baseline grip strength is strong she is status post amputation of her right great toe I do not see any changes from baseline.  Neurologic is grossly intact her speech is clear without lateralizing findings.  Psych she is largely alert and oriented a bit anxious about the exam but very pleasant and appropriate  Labs reviewed:   Recent Labs    05/12/16 0700  07/18/16 0740 09/19/16 0500 11/30/16 0748  NA 140   < > 140 140 140  K 3.9   < > 3.7 3.8 3.8  CL 103   < > 103 103 103  CO2 29   < > 28 28 26   GLUCOSE 90   < > 84 80 78  BUN 33*   < > 37* 34* 35*  CREATININE 1.30*   < > 1.43* 1.33* 1.26*  CALCIUM 8.9   < > 8.7* 9.0 9.1  MG 2.1  --   --   --   --    < > = values in this interval not displayed.   Recent Labs    05/12/16 0700 11/30/16 0748  AST 19 22  ALT 15 17  ALKPHOS 97 100  BILITOT 0.3 0.5  PROT 7.3 7.2  ALBUMIN 3.6 3.7   Recent Labs    11/30/16 0748 12/14/16 1000 03/07/17 1455  WBC 4.5 5.8 6.7  NEUTROABS 2.3 3.8 4.7  HGB 15.6* 15.7* 15.3*  HCT 48.7* 49.1* 47.4*  MCV 95.5 97.4 97.5  PLT 275 281 272   Lab Results  Component Value Date   TSH 1.400 02/16/2017   No results found for: HGBA1C Lab Results  Component Value Date   CHOL 145 09/19/2016   HDL 41 09/19/2016   LDLCALC 81 09/19/2016   TRIG 116 09/19/2016   CHOLHDL 3.5 09/19/2016    Significant Diagnostic Results in last 30 days:  No results found.  Assessment/Plan  #1 change in status?-Patient is denying feeling ill-physical exam was fairly benign but blood pressure is elevated- we will await the updated labs-continue to monitor--  Addendum.  I have done serial blood pressure  checks and blood pressure has come down to systolic of 170 diastolic of 80- nursing staff will continue to monitor this.  Anxiety may be playing a role here she is on Norvasc 5 mg a day as well.  We have obtained the updated  labs as well which were reassuring-- Hemoglobin is stable at 15.3 white count is normal at 6.7 platelets are 272.  Metabolic panel also is reassuring sodium 140 potassium 4 BUN 38 creatinine 1.2 liver function tests remain within normal limits.  We have also ordered a TSH with her history of    hypothyroidism she is on Synthroid that is still pending.  I discussed this with Dr. Chales Abrahams via phone and will order neuro checks with vital signs to keep an eye on her   .  She also has an order for Norco as needed for pain from the extractions although it does not appear that she has really been taking this nonetheless will discontinue this since this could cause altered mental status- she does have as needed Tylenol and this will have to be encouraged.  I did reassess patient several times this afternoon and early evening she continued to deny feeling ill-physical exam was relatively unchanged her blood pressure gradually did come down name before I left it was 140/76--- she also denied any dental discomfort-at this point continue monitoring her vital signs and neuro checks-.  Of note her TSH also has come back within normal range at 1.467  [id number]-note greater than 45 minutes spent assessing patient-reassessing patient- discussing her status with nursing staff- coordinating and formulating plan of care-note  greater than 50% of time spent coordinating plan of care with input as noted above

## 2017-03-12 ENCOUNTER — Encounter: Payer: Self-pay | Admitting: Internal Medicine

## 2017-03-12 ENCOUNTER — Non-Acute Institutional Stay (SKILLED_NURSING_FACILITY): Payer: Medicare Other | Admitting: Internal Medicine

## 2017-03-12 DIAGNOSIS — I779 Disorder of arteries and arterioles, unspecified: Secondary | ICD-10-CM

## 2017-03-12 DIAGNOSIS — I2510 Atherosclerotic heart disease of native coronary artery without angina pectoris: Secondary | ICD-10-CM | POA: Diagnosis not present

## 2017-03-12 DIAGNOSIS — N183 Chronic kidney disease, stage 3 unspecified: Secondary | ICD-10-CM

## 2017-03-12 DIAGNOSIS — I480 Paroxysmal atrial fibrillation: Secondary | ICD-10-CM

## 2017-03-12 DIAGNOSIS — E039 Hypothyroidism, unspecified: Secondary | ICD-10-CM

## 2017-03-12 DIAGNOSIS — I10 Essential (primary) hypertension: Secondary | ICD-10-CM

## 2017-03-12 NOTE — Progress Notes (Signed)
Location:   [facility] Room Number: 111/W Place of Service:  SNF [phone]) Provider:  Sabino Dick, MD  Patient Care Team: Mahlon Gammon, MD as PCP - General (Internal Medicine) Wyline Mood [name], MD as Consulting Physician (Cardiology) Synetta Shadow as Physician Assistant (Internal Medicine)  Extended Emergency Contact Information Primary Emergency Contact: Casimer Lanius [place] Home Phone: [phone] Mobile Phone: [phone] Relation: None Secondary Emergency Contact: Margarito Courser [place] Home Phone: [phone] Relation: None  Code Status:  DNR Goals of care: Advanced Directive information Advanced Directives 03/12/2017  Does Patient Have a Medical Advance Directive? Yes  Type of Advance Directive Out of facility DNR (pink MOST or yellow form)  Does patient want to make changes to medical advance directive? No - Patient declined  Copy of Healthcare Power of Attorney in Chart? No - copy requested  Would patient like information on creating a medical advance directive? No - Patient declined     Chief Complaint  Patient presents with  . Medical Management of Chronic Issues    Routine Visit  For medical management of chronic medical issues including atrial fibrillation-coronary artery disease-hypertension-hyperlipidemia- peripheral arterial disease-as well as hypothyroidism GERD and grade 2 diastolic CHF as well as anemia  HPI:  Pt is a 84 y.o. female seen today for medical management of chronic diseases.  As noted above.  Most recent acute issue was last week with nursing staff thought she was just not herself.  She was lying in bed which she normally does not do.  Blood pressure initially was somewhat elevated with systolics in the 180s but this did come down.  Otherwise physical exam was pretty baseline.  It was thought possibly she had her teeth pulled earlier in the week and  this had some effect on her being somewhat weaker.  However she has returned back to her baseline she does not complain of any mouth pain vital signs appear to be stable she is pretty much herself-ambulating about and actually when I saw her earlier was eagerly waiting to eat her supper.  She does have a history of atrial fibrillation she is only on aspirin for anticoagulation secondary I suspect to advanced age and comorbidities she also is on amiodarone for rate control which appears to be effective.  Regards coronary artery disease this appears largely asymptomatic she is on a statin as well as Lasix and aspirin.  Per echo done in January 2018 she does have grade 2 diastolic dysfunction her weight appears to be stable she has quite minimal lower extremity edema she is again is on Lasix.  Regards to hypertension she is on Norvasc 5 mg a day listed blood pressure today was 123/65 I got a fairly comparable reading of 128 with diastolic 80.  .  In regards to peripheral arterial disease she is status post right great toe amputation-she also has a history of left peroneal arthrectomy and appears to be stable here again she is on a statin as well as Lasix and aspirin.  At one point she had leg and foot wounds but these have largely resolved she does have a residual scab on her right foot this does not appear to be of great concern however at this point-  She also has a history of hypothyroidism she is on Synthroid we did a TSH last week which was unremarkable at 1.467.  At one point she did have anemia and was on  iron but hemoglobin has really risen significantly is now over 15 and fairly consistent she is off the iron.  She also has a history of chronic kidney disease which has shown some stability with creatinine 1.2 on recent labs.  Currently she has no complaints- she does get somewhat anxious at times when her blood pressure taken which I suspect may contribute occasionally to some higher  blood pressure readings     Past Medical History:  Diagnosis Date  . Acid reflux   . Carotid artery occlusion    minimal  . Coronary artery disease   . Hypercholesteremia   . Hypertension   . Myocardial infarct (HCC)   . PAF (paroxysmal atrial fibrillation) (HCC)   . Skin disorder   . Systolic murmur   . Thyroid disease    Past Surgical History:  Procedure Laterality Date  . ABDOMINAL HYSTERECTOMY    . AMPUTATION Right 01/05/2016   Procedure: RIGHT GREAT TOE AMPUTATION;  Surgeon: Nada LibmanVance W Brabham, MD;  [facility];  Service: Vascular;  Laterality: Right;  . CARDIAC CATHETERIZATION    . CARDIOVERSION  2012  . IR GENERIC HISTORICAL  12/30/2015   IR ANGIOGRAM EXTREMITY BILATERAL 12/30/2015 [facility]  . IR GENERIC HISTORICAL  12/30/2015   IR ANGIOGRAM SELECTIVE EACH ADDITIONAL VESSEL 12/30/2015 [facility]  . LOWER EXTREMITY ANGIOGRAM Right 12/30/2015   Procedure: RIGHT LOWER EXTREMITY ANGIOGRAM;  Surgeon: Ancil LinseyJason Evan Davis, MD;  Location: [facility] OR[facility];  Service: Vascular;  Laterality: Right;  accessed through left groin area - dressed with two 2x2 and 4x4 opsite  . PERIPHERAL VASCULAR CATHETERIZATION N/A 01/04/2016   Procedure: Lower Extremity Angiography;  Surgeon: Nada LibmanVance W Brabham, MD;  [facility];  Service: Cardiovascular;  Laterality: N/A;  . PERIPHERAL VASCULAR CATHETERIZATION Right 01/04/2016   Procedure: Peripheral Vascular Atherectomy;  Surgeon: Nada LibmanVance W Brabham, MD;  Location: [facility] INVASIVE CV LAB;  Service: Cardiovascular;  Laterality: Right;  Peroneal  . PERIPHERAL VASCULAR CATHETERIZATION N/A 02/01/2016   Procedure: Abdominal Aortogram w/Lower Extremity;  Surgeon: Nada LibmanVance W Brabham, MD;  Location: [facility] INVASIVE CV LAB;  Service: Cardiovascular;  Laterality: N/A;  . PERIPHERAL VASCULAR CATHETERIZATION  02/01/2016   Procedure: Peripheral Vascular Atherectomy;  Surgeon: Nada LibmanVance W Brabham, MD;  Location: [facility] INVASIVE CV LAB;  Service: Cardiovascular;;  Left   .  THYROID SURGERY      No Known Allergies  Outpatient Encounter Medications as of 03/12/2017  Medication Sig  . acetaminophen (TYLENOL) 325 MG tablet Take 650 mg by mouth every 4 (four) hours as needed.   . Amino Acids-Protein Hydrolys (FEEDING SUPPLEMENT, PRO-STAT SUGAR FREE 64,) LIQD Take 30 mLs by mouth 2 (two) times daily between meals.  Marland Kitchen. amiodarone (PACERONE) 200 MG tablet Take 0.5 tablets (100 mg total) by mouth daily.  Marland Kitchen. amLODipine (NORVASC) 10 MG tablet Take 5 mg by mouth daily.   Marland Kitchen. aspirin EC 81 MG tablet Take 1 tablet (81 mg total) by mouth daily.  Marland Kitchen. atorvastatin (LIPITOR) 20 MG tablet Take 20 mg by mouth every other day. Take 1 tablet (20 mg) by mouth every other night  . docusate sodium (COLACE) 100 MG capsule Take 100 mg by mouth daily.  . furosemide (LASIX) 20 MG tablet Give 1 tablet by mouth daily and prn as needed  . levothyroxine (SYNTHROID, LEVOTHROID) 100 MCG tablet Take 100 mcg by mouth daily before breakfast.  . ranitidine (ZANTAC) 150 MG tablet Take 150 mg by mouth daily.   Marland Kitchen. senna (SENOKOT) 8.6 MG  TABS tablet Take 2 tablets by mouth daily.   . [DISCONTINUED] HYDROcodone-acetaminophen (NORCO/VICODIN) 5-325 MG tablet Take 1 tablet by mouth every 6 (six) hours as needed for moderate pain.   No facility-administered encounter medications on file as of 03/12/2017.      Review of Systems   General she is not complaining of fever chills or weight appears to be stable   skin she is not complaining of rashes itching or diaphoresis   She does have a well-healed scab lateral right foot.  Foot wounds have largely resolved.  Head ears eyes nose mouth and throat is not complaining of any sore throat or visual changes she has prescription lenses.  Does not really complain of mouth or dental pain despite her extractions last week   Respiratory does not complain of shortness of breath or cough.  Cardiac denies chest pain has quite minimal lower extremity edema.  GI is not  complaining of any abdominal discomfort nausea vomiting diarrhea constipation-  GU does not complain of dysuria.  Musculoskeletal is not complaining of joint pain at this time.  Neurologic does not complain of dizziness headache or syncope has some lower extremity weakness which is baseline.  Psych is not complaining of anxiety or depression-this has  been quite stable for some time   Immunization History  Administered Date(s) Administered  . Influenza-Unspecified 10/06/2016  . Pneumococcal Conjugate-13 08/24/2016  . Tdap 09/14/2016   Pertinent  Health Maintenance Due  Topic Date Due  . DEXA SCAN  03/20/2017 (Originally 02/09/1985)  . PNA vac Low Risk Adult (2 of 2 - PPSV23) 08/24/2017  . INFLUENZA VACCINE  Completed   No flowsheet data found. Functional Status Survey:    Vitals:   03/12/17 1550  BP: 123/65  Pulse: 88  Resp: 20  Temp: 97.8 F (36.6 C)  TempSrc: Oral  SpO2: 100%  Weight: 125 lb 12.8 oz (57.1 kg)  Height: 5\' 5"  (1.651 m)   Body mass index is 20.93 kg/m. Physical Exam  In general this is a pleasant elderly female no distress sitting comfortably in her chair.  Her skin is warm and dry she does have a well-healed scab right lateral foot.  Eyes sclera and conjunctive are clear she has prescription lenses visual acuity appears grossly intact.  Oropharynx is clear mucous membranes moist.  Chest is clear to auscultation there is no labored breathing.  Heart is regular rate and rhythm with possibly a slight systolic murmur she has minimal lower extremity edema.  Abdomen soft nontender with positive bowel sounds.  Musculoskeletal moves all extremities at baseline largely ambulates in her wheelchair she is status post amputation of her right great toe.  Neurologic is grossly intact her speech is clear no lateralizing findings cranial nerves appear intact.  Psych she appears largely alert and oriented pleasant and appropriate was able to speak in detail  about her siblings and parents and that they pretty much all had long lives     Labs reviewed: Recent Labs    05/12/16 0700  09/19/16 0500 11/30/16 0748 03/07/17 1450  NA 140   < > 140 140 140  K 3.9   < > 3.8 3.8 4.0  CL 103   < > 103 103 98*  CO2 29   < > 28 26 28   GLUCOSE 90   < > 80 78 75  BUN 33*   < > 34* 35* 38*  CREATININE 1.30*   < > 1.33* 1.26* 1.20*  CALCIUM 8.9   < >  9.0 9.1 9.2  MG 2.1  --   --   --   --    < > = values in this interval not displayed.   Recent Labs    05/12/16 0700 11/30/16 0748 03/07/17 1450  AST 19 22 22   ALT 15 17 15   ALKPHOS 97 100 117  BILITOT 0.3 0.5 0.7  PROT 7.3 7.2 7.2  ALBUMIN 3.6 3.7 3.7   Recent Labs    11/30/16 0748 12/14/16 1000 03/07/17 1455  WBC 4.5 5.8 6.7  NEUTROABS 2.3 3.8 4.7  HGB 15.6* 15.7* 15.3*  HCT 48.7* 49.1* 47.4*  MCV 95.5 97.4 97.5  PLT 275 281 272   Lab Results  Component Value Date   TSH 1.467 03/07/2017   No results found for: HGBA1C Lab Results  Component Value Date   CHOL 145 09/19/2016   HDL 41 09/19/2016   LDLCALC 81 09/19/2016   TRIG 116 09/19/2016   CHOLHDL 3.5 09/19/2016    Significant Diagnostic Results in last 30 days:  No results found.  Assessment/Plan  #1-history of atrial fibrillation this appears rate controlled on amiodarone she is on aspirin for anticoagulation.  2.  History of coronary artery disease  this continues to stable she is on a statin as well as aspirin-does not really appear to be overtly symptomatic with no complaints of shortness of breath or chest pain.  3.  Hypertension this appears to be relatively stable on low-dose Norvasc appears baseline systolics recently have been in the 120s and does go up however it appears at times when she gets anxious at this point will monitor.  #4 history of peripheral arterial disease again she is status post right great toe amputation she has been followed by vascular closely and thought to be doing quite well her wounds  have essentially healed-she is on statin aspirin as well as Lasix.  5.  History of diastolic CHF grade 2 per echo done in January 2018 again she is on Lasix and appears to be tolerating this well labs have been unremarkable-weight appears to be stable edema appears to be fairly minimal.  6.  History of hypothyroidism she is on Synthroid TSH last week remains within normal limits at 1.467.  7.  History of GERD this appears controlled on Zantac.  8.  History of anemia at one point had been an issue she been on iron but this is stabilized with hemoglobin recently about 15 she is no longer on iron will monitor periodically.  9.  History of chronic kidney disease this appears stable as well with a creatinine of 1.2 on lab done last week.  10.  History of hyperlipidemia she is on a statin-lipid panel in September 2018 showed stability with an LDL of 81  [phone]

## 2017-03-20 ENCOUNTER — Encounter (HOSPITAL_COMMUNITY)
Admission: RE | Admit: 2017-03-20 | Discharge: 2017-03-20 | Disposition: A | Discharge status: Home / Self Care | Payer: Medicare Other | Source: Skilled Nursing Facility | Attending: Internal Medicine | Admitting: Internal Medicine

## 2017-03-20 DIAGNOSIS — E039 Hypothyroidism, unspecified: Secondary | ICD-10-CM | POA: Insufficient documentation

## 2017-03-20 LAB — TSH: TSH: 1.565 u[IU]/mL (ref 0.350–4.500)

## 2017-05-14 ENCOUNTER — Non-Acute Institutional Stay (SKILLED_NURSING_FACILITY): Payer: Medicare Other | Admitting: Internal Medicine

## 2017-05-14 ENCOUNTER — Encounter: Payer: Self-pay | Admitting: Internal Medicine

## 2017-05-14 DIAGNOSIS — I10 Essential (primary) hypertension: Secondary | ICD-10-CM | POA: Diagnosis not present

## 2017-05-14 DIAGNOSIS — N183 Chronic kidney disease, stage 3 unspecified: Secondary | ICD-10-CM

## 2017-05-14 DIAGNOSIS — I2510 Atherosclerotic heart disease of native coronary artery without angina pectoris: Secondary | ICD-10-CM | POA: Diagnosis not present

## 2017-05-14 DIAGNOSIS — E039 Hypothyroidism, unspecified: Secondary | ICD-10-CM

## 2017-05-14 DIAGNOSIS — I480 Paroxysmal atrial fibrillation: Secondary | ICD-10-CM

## 2017-05-14 DIAGNOSIS — I779 Disorder of arteries and arterioles, unspecified: Secondary | ICD-10-CM | POA: Diagnosis not present

## 2017-05-14 NOTE — Progress Notes (Signed)
Location:   [facility] Room Number: 111/W Place of Service:  SNF [phone]) Provider:  Pollyann Savoy, MD  Patient Care Team: Mahlon Gammon, MD as PCP - General (Internal Medicine) Wyline Mood [name], MD as PCP - Cardiology (Cardiology) Synetta Shadow as Physician Assistant (Internal Medicine)  Extended Emergency Contact Information Primary Emergency Contact: Casimer Lanius [place] Home Phone: [phone] Mobile Phone: [phone] Relation: None Secondary Emergency Contact: Margarito Courser [place] Home Phone: [phone] Relation: None  Code Status:  DNR Goals of care: Advanced Directive information Advanced Directives 05/14/2017  Does Patient Have a Medical Advance Directive? Yes  Type of Advance Directive Out of facility DNR (pink MOST or yellow form)  Does patient want to make changes to medical advance directive? No - Patient declined  Copy of Healthcare Power of Attorney in Chart? No - copy requested  Would patient like information on creating a medical advance directive? No - Patient declined     Chief Complaint  Patient presents with  . Medical Management of Chronic Issues    Routine Visit    HPI:  Pt is a 84 y.o. female seen today for medical management of chronic diseases.    Patient has Patient has H/O CAD with Cath done in 2008 on Medical management, Paroxysmal Atrial Fibrillation Not on any Anticoagulation, Hypertension, Hyperlipidemia., PAD,Right Great Toe Amputation in 01/18 And left Peroneal Atherectomy  Patient has been doing well in facility. Her LE Ulcers have nicely Healed now. Her weight has increased today to 132 lbs from 126 lbs. She says she her appetite has improved. She denies any SOB or Cough. No New Nursing issues Past Medical History:  Diagnosis Date  . Acid reflux   . Carotid artery occlusion    minimal  . Coronary artery disease   . Hypercholesteremia   .  Hypertension   . Myocardial infarct (HCC)   . PAF (paroxysmal atrial fibrillation) (HCC)   . Skin disorder   . Systolic murmur   . Thyroid disease    Past Surgical History:  Procedure Laterality Date  . ABDOMINAL HYSTERECTOMY    . AMPUTATION Right 01/05/2016   Procedure: RIGHT GREAT TOE AMPUTATION;  Surgeon: Nada Libman, MD;  Location[facility] OR;  Service: Vascular;  Laterality: Right;  . CARDIAC CATHETERIZATION    . CARDIOVERSION  2012  . IR GENERIC HISTORICAL  12/30/2015   IR ANGIOGRAM EXTREMITY BILATERAL 12/30/2015 [facility]  . IR GENERIC HISTORICAL  12/30/2015   IR ANGIOGRAM SELECTIVE EACH ADDITIONAL VESSEL 12/30/2015 [facility]  . LOWER EXTREMITY ANGIOGRAM Right 12/30/2015   Procedure: RIGHT LOWER EXTREMITY ANGIOGRAM;  Surgeon: Ancil Linsey, MD;  Location: [facility] ORS;  Service: Vascular;  Laterality: Right;  accessed through left groin area - dressed with two 2x2 and 4x4 opsite  . PERIPHERAL VASCULAR CATHETERIZATION N/A 01/04/2016   Procedure: Lower Extremity Angiography;  Surgeon: Nada Libman, MD;  Location: [facility] INVASIVE CV LAB;  Service: Cardiovascular;  Laterality: N/A;  . PERIPHERAL VASCULAR CATHETERIZATION Right 01/04/2016   Procedure: Peripheral Vascular Atherectomy;  Surgeon: Nada Libman, MD;  Location: [facility] INVASIVE CV LAB;  Service: Cardiovascular;  Laterality: Right;  Peroneal  . PERIPHERAL VASCULAR CATHETERIZATION N/A 02/01/2016   Procedure: Abdominal Aortogram w/Lower Extremity;  Surgeon: Nada Libman, MD;  Location: [facility] INVASIVE CV LAB;  Service: Cardiovascular;  Laterality: N/A;  . PERIPHERAL VASCULAR CATHETERIZATION  02/01/2016   Procedure: Peripheral Vascular Atherectomy;  Surgeon: Nada Libman, MD;  Location: [facility] INVASIVE CV LAB;  Service: Cardiovascular;;  Left   . THYROID SURGERY      No Known Allergies  Outpatient Encounter Medications as of 05/14/2017  Medication Sig  . acetaminophen (TYLENOL) 325 MG tablet Take 650 mg by mouth every 4  (four) hours as needed.   . Amino Acids-Protein Hydrolys (FEEDING SUPPLEMENT, PRO-STAT SUGAR FREE 64,) LIQD Take 30 mLs by mouth 2 (two) times daily between meals.  Marland Kitchen amiodarone (PACERONE) 200 MG tablet Take 0.5 tablets (100 mg total) by mouth daily.  Marland Kitchen amLODipine (NORVASC) 10 MG tablet Take 5 mg by mouth daily.   Marland Kitchen aspirin EC 81 MG tablet Take 1 tablet (81 mg total) by mouth daily.  Marland Kitchen atorvastatin (LIPITOR) 20 MG tablet Take 20 mg by mouth every other day. Take 1 tablet (20 mg) by mouth every other night  . docusate sodium (COLACE) 100 MG capsule Take 100 mg by mouth daily.  . furosemide (LASIX) 20 MG tablet Give 1 tablet by mouth daily and prn as needed  . levothyroxine (SYNTHROID, LEVOTHROID) 100 MCG tablet Take 100 mcg by mouth daily before breakfast.  . ranitidine (ZANTAC) 150 MG tablet Take 150 mg by mouth daily.   Marland Kitchen senna (SENOKOT) 8.6 MG TABS tablet Take 2 tablets by mouth daily.    No facility-administered encounter medications on file as of 05/14/2017.      Review of Systems  Review of Systems  Constitutional: Negative for activity change, appetite change, chills, diaphoresis, fatigue and fever.  HENT: Negative for mouth sores, postnasal drip, rhinorrhea, sinus pain and sore throat.   Respiratory: Negative for apnea, cough, chest tightness, shortness of breath and wheezing.   Cardiovascular: Negative for chest pain, palpitations and leg swelling.  Gastrointestinal: Negative for abdominal distention, abdominal pain, constipation, diarrhea, nausea and vomiting.  Genitourinary: Negative for dysuria and frequency.  Musculoskeletal: Negative for arthralgias, joint swelling and myalgias.  Skin: Negative for rash.  Neurological: Negative for dizziness, syncope, weakness, light-headedness and numbness.  Psychiatric/Behavioral: Negative for behavioral problems, confusion and sleep disturbance.     Immunization History  Administered Date(s) Administered  . Influenza-Unspecified  10/06/2016  . Pneumococcal Conjugate-13 08/24/2016  . Tdap 09/14/2016   Pertinent  Health Maintenance Due  Topic Date Due  . DEXA SCAN  06/14/2017 (Originally 02/09/1985)  . INFLUENZA VACCINE  08/02/2017  . PNA vac Low Risk Adult (2 of 2 - PPSV23) 08/24/2017   No flowsheet data found. Functional Status Survey:    Vitals:   05/14/17 1424  BP: (!) 154/81  Pulse: 61  Resp: 20  Temp: (!) 96.7 F (35.9 C)  TempSrc: Oral  SpO2: 99%  Weight: 132 lb 12.8 oz (60.2 kg)  Height:  (1.651 m)   Body mass index is 22.1 kg/m. Physical Exam  Constitutional: She appears well-developed and well-nourished.  HENT:  Head: Normocephalic.  Mouth/Throat: Oropharynx is clear and moist.  Eyes: Pupils are equal, round, and reactive to light.  Neck: Normal range of motion. Neck supple.  Cardiovascular: Normal rate and regular rhythm.  No murmur heard. Pulmonary/Chest: Effort normal and breath sounds normal. No stridor. No respiratory distress. She has no wheezes.  Abdominal: Soft. Bowel sounds are normal. She exhibits no distension. There is no tenderness. There is no guarding.  Musculoskeletal:  Mild edema Bilateral  Lymphadenopathy:    She has no cervical adenopathy.  Neurological: She is alert.  Skin: Skin is warm and dry.  Psychiatric: She has a  normal mood and affect. Her behavior is normal. Thought content normal.  Nursing note and vitals reviewed.   Labs reviewed: Recent Labs    09/19/16 0500 11/30/16 0748 03/07/17 1450  NA 140 140 140  K 3.8 3.8 4.0  CL 103 103 98*  CO2 GLUCOSE 80 78 75  BUN 34* 35* 38*  CREATININE 1.33* 1.26* 1.20*  CALCIUM 9.0 9.1 9.2   Recent Labs    11/30/16 0748 03/07/17 1450  AST 22 22  ALT 17 15  ALKPHOS 100 117  BILITOT 0.5 0.7  PROT 7.2 7.2  ALBUMIN 3.7 3.7   Recent Labs    11/30/16 0748 12/14/16 1000 03/07/17 1455  WBC 4.5 5.8 6.7  NEUTROABS 2.3 3.8 4.7  HGB 15.6* 15.7* 15.3*  HCT 48.7* 49.1* 47.4*  MCV 95.5 97.4  97.5  PLT 275 281 272   Lab Results  Component Value Date   TSH 1.565 03/20/2017   No results found for: HGBA1C Lab Results  Component Value Date   CHOL 145 09/19/2016   HDL 41 09/19/2016   LDLCALC 81 09/19/2016   TRIG 116 09/19/2016   CHOLHDL 3.5 09/19/2016    Significant Diagnostic Results in last 30 days:  No results found.  Assessment/Plan PAOD (peripheral arterial occlusive disease) Patient has done well and her Ulcers are completely healed Continue on Statin,Lasix and aspirin Essential hypertension BP Mildly elevated today. On Norvasc Will follow for now Coronary artery disease Stable Aspirin , Statin,   Paroxysmal atrial fibrillation She has been DCCV and on Amiodarone Only on Aspirin Hypothyroidism TSH Normal in 03/19  Chronic renal insufficiency, stage 3 (moderate) Creat Stable  Weight gain D/w the Nurse . Her edema is not worse then before. Most likely due to incrase Calorie intake. No signs of Volume overload  Hyperlipidemia On statin  IGERD On zantac     Family/ staff Communication:   Labs/tests ordered:   Total time spent in this patient care encounter was 25_ minutes; greater than 50% of the visit spent counseling patient, reviewing records , Labs and coordinating care for problems addressed at this encounter.

## 2017-05-15 ENCOUNTER — Ambulatory Visit (INDEPENDENT_AMBULATORY_CARE_PROVIDER_SITE_OTHER): Payer: Medicare Other | Admitting: Cardiology

## 2017-05-15 ENCOUNTER — Encounter: Payer: Self-pay | Admitting: Cardiology

## 2017-05-15 VITALS — BP 136/68 | HR 61 | Ht 65.0 in | Wt 134.0 lb

## 2017-05-15 DIAGNOSIS — I2510 Atherosclerotic heart disease of native coronary artery without angina pectoris: Secondary | ICD-10-CM

## 2017-05-15 DIAGNOSIS — E782 Mixed hyperlipidemia: Secondary | ICD-10-CM | POA: Diagnosis not present

## 2017-05-15 DIAGNOSIS — I4891 Unspecified atrial fibrillation: Secondary | ICD-10-CM

## 2017-05-15 DIAGNOSIS — I10 Essential (primary) hypertension: Secondary | ICD-10-CM

## 2017-05-15 NOTE — Progress Notes (Signed)
Clinical Summary [name] is a 84 y.o.female seen today for follow up of the following medical problems.    1. CAD  - cath 2008 with 80% diag, 80% ostial LCX that has been treated medically per notes. LVEF at that time 60% by LV gram.   - no recent chest pain. No SOB/DOE - compliant with med  2. Parox afib  - has been managed on amio, has had prior DCCV 2012.She had bradycardia when previously on dilt .  - not on anticoag by her previous cardiologist.From what I can gather from the notes it seems this is because she has not had any clear recurrence of her afib since her DCCV   - no recent palpitations.   3. HTN  -compliant with meds, home bps 130s/60s   4. Hyperlipidemia  - did not tolerate daily statin, doing well on every other day dosing.  - 09/2016 TC 145 TG 116 HDL 41 LDL 81  -she remains compliant with meds  5. PAD - followed by vascular   SH: lives at [facility] Past Medical History:  Diagnosis Date  . Acid reflux   . Carotid artery occlusion    minimal  . Coronary artery disease   . Hypercholesteremia   . Hypertension   . Myocardial infarct (HCC)   . PAF (paroxysmal atrial fibrillation) (HCC)   . Skin disorder   . Systolic murmur   . Thyroid disease      No Known Allergies   No current outpatient medications on file.   No current facility-administered medications for this visit.      Past Surgical History:  Procedure Laterality Date  . ABDOMINAL HYSTERECTOMY    . AMPUTATION Right 01/05/2016   Procedure: RIGHT GREAT TOE AMPUTATION;  Surgeon: Nada Libman, MD;  Location: [facility] OR;  Service: Vascular;  Laterality: Right;  . CARDIAC CATHETERIZATION    . CARDIOVERSION  2012  . IR GENERIC HISTORICAL  12/30/2015   IR ANGIOGRAM EXTREMITY BILATERAL 12/30/2015 [facility]  . IR GENERIC HISTORICAL  12/30/2015   IR ANGIOGRAM SELECTIVE EACH ADDITIONAL VESSEL 12/30/2015 [facility]  . LOWER EXTREMITY ANGIOGRAM Right  12/30/2015   Procedure: RIGHT LOWER EXTREMITY ANGIOGRAM;  Surgeon: Ancil Linsey, MD;  Location: [facility] OR[facility];  Service: Vascular;  Laterality: Right;  accessed through left groin area - dressed with two 2x2 and 4x4 opsite  . PERIPHERAL VASCULAR CATHETERIZATION N/A 01/04/2016   Procedure: Lower Extremity Angiography;  Surgeon: Nada Libman, MD;  [facility] INVASIVE CV LAB;  Service: Cardiovascular;  Laterality: N/A;  . PERIPHERAL VASCULAR CATHETERIZATION Right 01/04/2016   Procedure: Peripheral Vascular Atherectomy;  Surgeon: Nada Libman, MD;  Location: [facility] INVASIVE CV LAB;  Service: Cardiovascular;  Laterality: Right;  Peroneal  . PERIPHERAL VASCULAR CATHETERIZATION N/A 02/01/2016   Procedure: Abdominal Aortogram w/Lower Extremity;  Surgeon: Nada Libman, MD;  Location: [facility] INVASIVE CV LAB;  Service: Cardiovascular;  Laterality: N/A;  . PERIPHERAL VASCULAR CATHETERIZATION  02/01/2016   Procedure: Peripheral Vascular Atherectomy;  Surgeon: Nada Libman, MD;  Location: [facility] INVASIVE CV LAB;  Service: Cardiovascular;;  Left   . THYROID SURGERY       No Known Allergies    Family History  Family history unknown: Yes     Social History [name] reports that she has never smoked. She has never used smokeless tobacco. [name] reports that she does not drink alcohol.   Review of Systems CONSTITUTIONAL: No weight loss, fever, chills,  weakness or fatigue.  HEENT: Eyes: No visual loss, blurred vision, double vision or yellow sclerae.No hearing loss, sneezing, congestion, runny nose or sore throat.  SKIN: No rash or itching.  CARDIOVASCULAR: per hpi RESPIRATORY: No shortness of breath, cough or sputum.  GASTROINTESTINAL: No anorexia, nausea, vomiting or diarrhea. No abdominal pain or blood.  GENITOURINARY: No burning on urination, no polyuria NEUROLOGICAL: No headache, dizziness, syncope, paralysis, ataxia, numbness or tingling in the extremities. No change in bowel or bladder  control.  MUSCULOSKELETAL: No muscle, back pain, joint pain or stiffness.  LYMPHATICS: No enlarged nodes. No history of splenectomy.  PSYCHIATRIC: No history of depression or anxiety.  ENDOCRINOLOGIC: No reports of sweating, cold or heat intolerance. No polyuria or polydipsia.  Marland Kitchen   Physical Examination Vitals:   05/15/17 1008  BP: 136/68  Pulse: 61  SpO2: 98%   Vitals:   05/15/17 1008  Weight: 134 lb (60.8 kg)  Height:  (1.651 m)    Gen: resting comfortably, no acute distress HEENT: no scleral icterus, pupils equal round and reactive, no palptable cervical adenopathy,  CV: RRR, 2/6 systolic murmur rusb, no jvd Resp: Clear to auscultation bilaterally GI: abdomen is soft, non-tender, non-distended, normal bowel sounds, no hepatosplenomegaly MSK: extremities are warm, no edema.  Skin: warm, no rash Neuro:  no focal deficits Psych: appropriate affect   Diagnostic Studies  04/2012 Echo Severe LVH, grade I diastolic dysfunction, mild MR. No LVEF mentioned. Aortic sclerosis without stenosis   03/2006 Cath RESULTS:  1. Hemodynamic monitoring: Central aortic pressure was 184/80. Left  ventricular pressure was 184/22 with no significant valve gradient  at the time of pull-back.  2. Ventriculography: Ventriculography in the RAO projection using 20  mL of contrast, 12 mL per second, revealed normal LV systolic  function, mild left ventricular hypertrophy. Ejection fraction  excess of 60% and the end-diastolic pressure was 25.  3. Distal aortogram: Distal aortogram done above the level of renal  artery showed no evidence of renal artery stenosis and no abdominal  aortic aneurysm.  CORONARY ARTERIOGRAPHY: On fluoroscopy, faint calcification was seen in  the distribution of the left main, proximal LAD and proximal right  coronary artery.  1. Left main normal. It trifurcated.  2. Circumflex. There was ostial 80% narrowing of the circumflex as it  came  off the left main. The mid and distal vessel were free of  disease.  3. Ramus intermediate (optional diagonal) moderate size and normal.  4. Left anterior descending. The LAD extended down to the apex of the  heart. The distal LAD was free of disease. The mid portion of the  LAD had an area of 30-40% narrowing at the takeoff of first  diagonal. The first diagonal had an 85% area of ostial narrowing.  5. Right coronary artery. The right coronary artery gave rise to the  PDA. This vessel was free of disease. Several injections of the  cusp did not show any evidence of ostial narrowing.  CONCLUSION:  1. High-grade ostial narrowing of the circumflex at the left main.  2. Ostial narrowing of the first diagonal at the take-off of the LAD.  3. Left ventricular hypertrophy.  4. Normal LV systolic function.  It appears this lady will need bypass surgery because of the ostial  circumflex lesion. I plan to ask my colleagues for their opinion before  we refer her to CVTS. She will be restarted on IV heparin in about 3  hours following her cath. I have temporarily  stopped her Coumadin.    03/2015 Echo Study Conclusions  - Left ventricle: The cavity size was normal. Wall thickness was  increased in a pattern of severe LVH. Systolic function was  normal. The estimated ejection fraction was in the range of 60%  to 65%. Wall motion was normal; there were no regional wall  motion abnormalities. - Aortic valve: Valve area (VTI): 1.28 cm^2. Valve area (Vmax):  1.31 cm^2. Valve area (Vmean): 1.26 cm^2. - Mitral valve: Thickening and calcification of the basal leaflets.  Calcified annulus. There was mild regurgitation. - Left atrium: The atrium was moderately dilated. - Atrial septum: No defect or patent foramen ovale was identified. - Pulmonary arteries: PA peak pressure: 39 mm Hg (S). - Pericardium, extracardiac: Small mostly posterior pericardial  effusion no  tamponade.     Assessment and Plan   1. CAD  - no recent symptoms, continue current meds  2. Parox afib  - Has not been on anticoag from her prior cardiologist, I presume due to no detected recurrence of afib after her DCCV in 2012 - EKG in clinic today shows NSR, continue current meds   3. HTN  - reasonable control give her age and risk for medication side effects, continue current meds  4. Hyperlipidemia  - muscle aches on daily statin, tolerating every other day dosing. - continue current staitn   F/u 6 months        Antoine Poche, M.D.

## 2017-05-15 NOTE — Patient Instructions (Signed)

## 2017-05-20 ENCOUNTER — Encounter: Payer: Self-pay | Admitting: Cardiology

## 2017-06-04 ENCOUNTER — Encounter: Payer: Self-pay | Admitting: Internal Medicine

## 2017-06-04 ENCOUNTER — Non-Acute Institutional Stay (SKILLED_NURSING_FACILITY): Payer: Medicare Other | Admitting: Internal Medicine

## 2017-06-04 DIAGNOSIS — N183 Chronic kidney disease, stage 3 unspecified: Secondary | ICD-10-CM

## 2017-06-04 DIAGNOSIS — I779 Disorder of arteries and arterioles, unspecified: Secondary | ICD-10-CM

## 2017-06-04 DIAGNOSIS — I10 Essential (primary) hypertension: Secondary | ICD-10-CM | POA: Diagnosis not present

## 2017-06-04 DIAGNOSIS — I480 Paroxysmal atrial fibrillation: Secondary | ICD-10-CM

## 2017-06-04 DIAGNOSIS — I2510 Atherosclerotic heart disease of native coronary artery without angina pectoris: Secondary | ICD-10-CM | POA: Diagnosis not present

## 2017-06-04 DIAGNOSIS — N2889 Other specified disorders of kidney and ureter: Secondary | ICD-10-CM

## 2017-06-04 DIAGNOSIS — E039 Hypothyroidism, unspecified: Secondary | ICD-10-CM

## 2017-06-04 NOTE — Progress Notes (Signed)
Location:   [facility] Room Number: 111/W Place of Service:  SNF [phone]) Provider:  Sabino Dick, MD  Patient Care Team: Mahlon Gammon, MD as PCP - General (Internal Medicine) Wyline Mood [name], MD as PCP - Cardiology (Cardiology) Synetta Shadow as Physician Assistant (Internal Medicine)  Extended Emergency Contact Information Primary Emergency Contact: Casimer Lanius [place] Home Phone: [phone] Mobile Phone: [phone] Relation: None Secondary Emergency Contact: Margarito Courser [place] Home Phone: [phone] Relation: None  Code Status:  DNR Goals of care: Advanced Directive information Advanced Directives 05/14/2017  Does Patient Have a Medical Advance Directive? Yes  Type of Advance Directive Out of facility DNR (pink MOST or yellow form)  Does patient want to make changes to medical advance directive? No - Patient declined  Copy of Healthcare Power of Attorney in Chart? No - copy requested  Would patient like information on creating a medical advance directive? No - Patient declined     Chief Complaint  Patient presents with  . Medical Management of Chronic Issues    Patient being seen for Routine Visit  For medical management of chronic medical conditions including atrial fibrillation-hypertension-coronary artery disease hyperlipidemia- peripheral arterial disease status post right great toe amputation- chronic renal insufficiency-hypothyroidism- grade 2 diastolic CHF-anemia HPI:  Pt is a 84 y.o. female seen today for medical management of chronic diseases.  As noted above.  She has had a fairly extended period of stability.  At one point had significant lower extremity wounds with a history of peripheral arterial disease she has been followed by vascular and these have largely healed up she does have a residual callus on her left heel but this is been quite stable she is status  post right great toe amputation.  She also has a cardiac history and recently saw cardiology and thought to be stable with her numerous diagnoses including atrial fibrillation which is controlled on amiodarone she is status post DCCV.  She also has a history of hypertension to have elevated systolics but I do not see consistent elevations blood pressure today 136/78 which appears to be relatively her normal range.  She is on Norvasc and Lasix.  She is also on Lasix with a history of diastolic CHF she has gained weight but this is thought to be appetite related.  She is gained about 6 pounds over the past several months.  She does not complain of any shortness of breath or chest pain or increased edema she does have some quite mild edema which appears baseline.  She does have a history of hypothyroidism TSH has been within normal limits most recently taken in March.  She also has a history of anemia which is been stable hemoglobin actually in March was 15.3 at one point she was actually on iron but this discontinued it appears her hemoglobin has remained stable will update this however.  Currently she has no complaints she is sitting in her wheelchair comfortably continues to be bright and alert does appear to have some mild cognitive impairment --- per nursing is slowly progressing but she is doing very well with supportive care    Past Medical History:  Diagnosis Date  . Acid reflux   . Carotid artery occlusion    minimal  . Coronary artery disease   . Hypercholesteremia   . Hypertension   . Myocardial infarct (HCC)   . PAF (paroxysmal atrial fibrillation) (HCC)   .  Skin disorder   . Systolic murmur   . Thyroid disease    Past Surgical History:  Procedure Laterality Date  . ABDOMINAL HYSTERECTOMY    . AMPUTATION Right 01/05/2016   Procedure: RIGHT GREAT TOE AMPUTATION;  Surgeon: Nada Libman, MD;  Location: [facility] OR;  Service: Vascular;  Laterality: Right;  . CARDIAC  CATHETERIZATION    . CARDIOVERSION  2012  . IR GENERIC HISTORICAL  12/30/2015   IR ANGIOGRAM EXTREMITY BILATERAL 12/30/2015 [facility]  . IR GENERIC HISTORICAL  12/30/2015   IR ANGIOGRAM SELECTIVE EACH ADDITIONAL VESSEL 12/30/2015 [facility]  . LOWER EXTREMITY ANGIOGRAM Right 12/30/2015   Procedure: RIGHT LOWER EXTREMITY ANGIOGRAM;  Surgeon: Ancil Linsey, MD;  Location: [facility] ORS;  Service: Vascular;  Laterality: Right;  accessed through left groin area - dressed with two 2x2 and 4x4 opsite  . PERIPHERAL VASCULAR CATHETERIZATION N/A 01/04/2016   Procedure: Lower Extremity Angiography;  Surgeon: Nada Libman, MD;  Location: [facility] INVASIVE CV LAB;  Service: Cardiovascular;  Laterality: N/A;  . PERIPHERAL VASCULAR CATHETERIZATION Right 01/04/2016   Procedure: Peripheral Vascular Atherectomy;  Surgeon: Nada Libman, MD;  Location: [facility] INVASIVE CV LAB;  Service: Cardiovascular;  Laterality: Right;  Peroneal  . PERIPHERAL VASCULAR CATHETERIZATION N/A 02/01/2016   Procedure: Abdominal Aortogram w/Lower Extremity;  Surgeon: Nada Libman, MD;  Location: [facility] INVASIVE CV LAB;  Service: Cardiovascular;  Laterality: N/A;  . PERIPHERAL VASCULAR CATHETERIZATION  02/01/2016   Procedure: Peripheral Vascular Atherectomy;  Surgeon: Nada Libman, MD;  Location: [facility] INVASIVE CV LAB;  Service: Cardiovascular;;  Left   . THYROID SURGERY      No Known Allergies  Outpatient Encounter Medications as of 06/04/2017  Medication Sig  . acetaminophen (TYLENOL) 325 MG tablet Take 650 mg by mouth every 4 (four) hours as needed.   Marland Kitchen amiodarone (PACERONE) 200 MG tablet Take 0.5 tablets (100 mg total) by mouth daily.  Marland Kitchen amLODipine (NORVASC) 5 MG tablet Take 5 mg by mouth daily.  Marland Kitchen aspirin EC 81 MG tablet Take 1 tablet (81 mg total) by mouth daily.  Marland Kitchen atorvastatin (LIPITOR) 20 MG tablet Take 20 mg by mouth every other day. Take 1 tablet (20 mg) by mouth every other night  . docusate sodium (COLACE) 100 MG  capsule Take 100 mg by mouth daily.  . furosemide (LASIX) 20 MG tablet Take 20 mg by mouth daily.  Marland Kitchen levothyroxine (SYNTHROID, LEVOTHROID) 100 MCG tablet Take 100 mcg by mouth daily before breakfast.  . ranitidine (ZANTAC) 150 MG tablet Take 150 mg by mouth daily.   Marland Kitchen senna (SENOKOT) 8.6 MG TABS tablet Take 2 tablets by mouth daily.   . [DISCONTINUED] Amino Acids-Protein Hydrolys (FEEDING SUPPLEMENT, PRO-STAT SUGAR FREE 64,) LIQD Take 30 mLs by mouth 2 (two) times daily between meals.   No facility-administered encounter medications on file as of 06/04/2017.      Review of Systems   In general she is not complaining of fever chills has gained some weight but this is thought to be appetite related.  Skin is not complain of rashes or itching lower extremity wounds appear to have healed.  Head ears eyes nose mouth and throat she has prescription lenses does not complain of visual changes or sore throat or difficulty swallowing.  Respiratory is not complaining of shortness of breath or cough.  Cardiac denies chest pain has some mild lower extremity edema at baseline.  GI does not complain of abdominal pain nausea vomiting diarrhea constipation.  GU is not complaining of dysuria.  Musculoskeletal largely ambulates in a wheelchair does not complain of joint pain at this time.  Neurologic is not complaining of dizziness headache or syncope or numbness.  And psych does not complain of being overtly depressed or anxious.  Nursing staff feels she has had some mild cognitive decline which has been gradual  Immunization History  Administered Date(s) Administered  . Influenza-Unspecified 10/06/2016  . Pneumococcal Conjugate-13 08/24/2016  . Tdap 09/14/2016   Pertinent  Health Maintenance Due  Topic Date Due  . DEXA SCAN  06/14/2017 (Originally 02/09/1985)  . INFLUENZA VACCINE  08/02/2017  . PNA vac Low Risk Adult (2 of 2 - PPSV23) 08/24/2017   No flowsheet data found. Functional Status  Survey:    Vitals:   06/04/17 1158  BP: 136/78  Pulse: 63  Resp: 20  Temp: (!) 96.7 F (35.9 C)  TempSrc: Oral  SpO2: 99%  Weight: 132 lb 12.8 oz (60.2 kg)  Height: 5\' 5"  (1.651 m)   Body mass index is 22.1 kg/m. Physical Exam   General this is a fairly well-nourished elderly female in no distress sitting comfortably in her wheelchair.  Her skin is warm and dry she does have a callus on her left heel.  Eyes she has prescription lenses visual acuity appears to be grossly intact sclera and conjunctive are clear.  Oropharynx is clear mucous membranes moist.  Chest is clear to auscultation there is no labored breathing.  Heart is regular rate and rhythm without murmur gallop rub she has quite mild lower extremity edema this is fairly minimal.  Abdomen is soft nontender with positive bowel sounds.  Musculoskeletal moves all her extremities at baseline largely ablate in a wheelchair I do not see changes other than arthritic strength appears to be intact and baseline all 4 extremities  Neurologic as noted above cranial nerves appear to be intact speech is clear no lateralizing findings.  Psych she is pleasant and appropriate has some mild cognitive deficits but is largely alert and oriented pleasant appropriate at her baseline  Labs reviewed: Recent Labs    09/19/16 0500 11/30/16 0748 03/07/17 1450  NA 140 140 140  K 3.8 3.8 4.0  CL 103 103 98*  CO2 28 26 28   GLUCOSE 80 78 75  BUN 34* 35* 38*  CREATININE 1.33* 1.26* 1.20*  CALCIUM 9.0 9.1 9.2   Recent Labs    11/30/16 0748 03/07/17 1450  AST 22 22  ALT 17 15  ALKPHOS 100 117  BILITOT 0.5 0.7  PROT 7.2 7.2  ALBUMIN 3.7 3.7   Recent Labs    11/30/16 0748 12/14/16 1000 03/07/17 1455  WBC 4.5 5.8 6.7  NEUTROABS 2.3 3.8 4.7  HGB 15.6* 15.7* 15.3*  HCT 48.7* 49.1* 47.4*  MCV 95.5 97.4 97.5  PLT 275 281 272   Lab Results  Component Value Date   TSH 1.565 03/20/2017   No results found for: HGBA1C Lab  Results  Component Value Date   CHOL 145 09/19/2016   HDL 41 09/19/2016   LDLCALC 81 09/19/2016   TRIG 116 09/19/2016   CHOLHDL 3.5 09/19/2016    Significant Diagnostic Results in last 30 days:  No results found.  Assessment/Plan  #1 atrial fibrillation this appears rate controlled on amiodarone she is status post DCCV recently seen by cardiology and thought to be doing well.  She is on aspirin.  2.  Hypertension this appears stable with occasional systolic spikes but these are not  persistent she is on Norvasc as well as low-dose Lasix.  3.  Coronary artery disease this appears relatively asymptomatic she is on aspirin and a statin LDL was 81 on lab done in September.  4- peripheral arterial disease she has been followed closely by vascular--- her wounds have healed up nicely--- status post right great toe amputation --continues on aspirin and a statin.  5.-  Chronic renal insufficiency this is been stable with a creatinine of 1.20 will update this appears relatively baseline  6.  History of hypothyroidism TSH was 1.565 which is within normal range this was done in March--she continues on Synthroid.  7.  Weight gain this has been likely related to a good appetite-she continues to slowly gain weight-she does not really show evidence of significant CHF with increased edema beyond baseline or chest pain or shortness of breath  #8 history of GERD this appears well controlled on Zantac.  9.  History of grade 2 diastolic CHF again this is thought to be stable weight gain is thought to be appetite related  #10-anemia again this is stabilized with a hemoglobin 15.3 on lab done in March she is now off iron but appears to be maintaining-at this point will update CBC to ensure stability  [phone]

## 2017-06-05 ENCOUNTER — Non-Acute Institutional Stay (SKILLED_NURSING_FACILITY): Payer: Medicare Other

## 2017-06-05 ENCOUNTER — Encounter (HOSPITAL_COMMUNITY)
Admission: RE | Admit: 2017-06-05 | Discharge: 2017-06-05 | Disposition: A | Discharge status: Home / Self Care | Payer: Medicare Other | Source: Skilled Nursing Facility | Attending: Internal Medicine | Admitting: Internal Medicine

## 2017-06-05 DIAGNOSIS — I5022 Chronic systolic (congestive) heart failure: Secondary | ICD-10-CM | POA: Insufficient documentation

## 2017-06-05 DIAGNOSIS — I739 Peripheral vascular disease, unspecified: Secondary | ICD-10-CM | POA: Diagnosis present

## 2017-06-05 DIAGNOSIS — I2510 Atherosclerotic heart disease of native coronary artery without angina pectoris: Secondary | ICD-10-CM | POA: Diagnosis not present

## 2017-06-05 DIAGNOSIS — K219 Gastro-esophageal reflux disease without esophagitis: Secondary | ICD-10-CM | POA: Diagnosis present

## 2017-06-05 DIAGNOSIS — Z0000 Encounter for general adult medical examination without abnormal findings: Secondary | ICD-10-CM | POA: Diagnosis not present

## 2017-06-05 LAB — CBC WITH DIFFERENTIAL/PLATELET
BASOS ABS: 0 10*3/uL (ref 0.0–0.1)
Basophils Relative: 1 %
Eosinophils Absolute: 0.2 10*3/uL (ref 0.0–0.7)
Eosinophils Relative: 5 %
HEMATOCRIT: 42.6 % (ref 36.0–46.0)
Hemoglobin: 13.8 g/dL (ref 12.0–15.0)
LYMPHS PCT: 29 %
Lymphs Abs: 1.3 10*3/uL (ref 0.7–4.0)
MCH: 30.8 pg (ref 26.0–34.0)
MCHC: 32.4 g/dL (ref 30.0–36.0)
MCV: 95.1 fL (ref 78.0–100.0)
MONO ABS: 0.5 10*3/uL (ref 0.1–1.0)
Monocytes Relative: 12 %
NEUTROS ABS: 2.3 10*3/uL (ref 1.7–7.7)
Neutrophils Relative %: 53 %
Platelets: 247 10*3/uL (ref 150–400)
RBC: 4.48 MIL/uL (ref 3.87–5.11)
RDW: 12.7 % (ref 11.5–15.5)
WBC: 4.4 10*3/uL (ref 4.0–10.5)

## 2017-06-05 LAB — BASIC METABOLIC PANEL
ANION GAP: 8 (ref 5–15)
BUN: 25 mg/dL — ABNORMAL HIGH (ref 6–20)
CO2: 29 mmol/L (ref 22–32)
Calcium: 8.9 mg/dL (ref 8.9–10.3)
Chloride: 105 mmol/L (ref 101–111)
Creatinine, Ser: 1.3 mg/dL — ABNORMAL HIGH (ref 0.44–1.00)
GFR calc Af Amer: 39 mL/min — ABNORMAL LOW (ref >60)
GFR calc non Af Amer: 34 mL/min — ABNORMAL LOW (ref >60)
GLUCOSE: 87 mg/dL (ref 65–99)
POTASSIUM: 3.7 mmol/L (ref 3.5–5.1)
Sodium: 142 mmol/L (ref 135–145)

## 2017-06-05 NOTE — Patient Instructions (Signed)
[name] , Thank you for taking time to come for your Medicare Wellness Visit. I appreciate your ongoing commitment to your health goals. Please review the following plan we discussed and let me know if I can assist you in the future.   Screening recommendations/referrals: Colonoscopy excluded, over age 82 Mammogram excluded, over age 82 Bone Density excluded, due to age and nonambulatory Recommended yearly ophthalmology/optometry visit for glaucoma screening and checkup Recommended yearly dental visit for hygiene and checkup  Vaccinations: Influenza vaccine up to date, due 2019 fall season Pneumococcal vaccine up to date, completed Tdap vaccine up to date, due 09/15/2026 Shingles vaccine not in past records    Advanced directives: in chart  Conditions/risks identified: none  Next appointment: Dr. Chales AbrahamsGupta makes rounds   Preventive Care 65 Years and Older, Female Preventive care refers to lifestyle choices and visits with your health care provider that can promote health and wellness. What does preventive care include?  A yearly physical exam. This is also called an annual well check.  Dental exams once or twice a year.  Routine eye exams. Ask your health care provider how often you should have your eyes checked.  Personal lifestyle choices, including:  Daily care of your teeth and gums.  Regular physical activity.  Eating a healthy diet.  Avoiding tobacco and drug use.  Limiting alcohol use.  Practicing safe sex.  Taking low-dose aspirin every day.  Taking vitamin and mineral supplements as recommended by your health care provider. What happens during an annual well check? The services and screenings done by your health care provider during your annual well check will depend on your age, overall health, lifestyle risk factors, and family history of disease. Counseling  Your health care provider may ask you questions about your:  Alcohol use.  Tobacco  use.  Drug use.  Emotional well-being.  Home and relationship well-being.  Sexual activity.  Eating habits.  History of falls.  Memory and ability to understand (cognition).  Work and work Astronomerenvironment.  Reproductive health. Screening  You may have the following tests or measurements:  Height, weight, and BMI.  Blood pressure.  Lipid and cholesterol levels. These may be checked every 5 years, or more frequently if you are over 673 years old.  Skin check.  Lung cancer screening. You may have this screening every year starting at age 82 if you have a 30-pack-year history of smoking and currently smoke or have quit within the past 15 years.  Fecal occult blood test (FOBT) of the stool. You may have this test every year starting at age 82.  Flexible sigmoidoscopy or colonoscopy. You may have a sigmoidoscopy every 5 years or a colonoscopy every 10 years starting at age 82.  Hepatitis C blood test.  Hepatitis B blood test.  Sexually transmitted disease (STD) testing.  Diabetes screening. This is done by checking your blood sugar (glucose) after you have not eaten for a while (fasting). You may have this done every 1-3 years.  Bone density scan. This is done to screen for osteoporosis. You may have this done starting at age 82.  Mammogram. This may be done every 1-2 years. Talk to your health care provider about how often you should have regular mammograms. Talk with your health care provider about your test results, treatment options, and if necessary, the need for more tests. Vaccines  Your health care provider may recommend certain vaccines, such as:  Influenza vaccine. This is recommended every year.  Tetanus, diphtheria, and  acellular pertussis (Tdap, Td) vaccine. You may need a Td booster every 10 years.  Zoster vaccine. You may need this after age 97.  Pneumococcal 13-valent conjugate (PCV13) vaccine. One dose is recommended after age 62.  Pneumococcal  polysaccharide (PPSV23) vaccine. One dose is recommended after age 2. Talk to your health care provider about which screenings and vaccines you need and how often you need them. This information is not intended to replace advice given to you by your health care provider. Make sure you discuss any questions you have with your health care provider. Document Released: 01/15/2015 Document Revised: 09/08/2015 Document Reviewed: 10/20/2014 Elsevier Interactive Patient Education  2017 [place] Prevention in the Home Falls can cause injuries. They can happen to people of all ages. There are many things you can do to make your home safe and to help prevent falls. What can I do on the outside of my home?  Regularly fix the edges of walkways and driveways and fix any cracks.  Remove anything that might make you trip as you walk through a door, such as a raised step or threshold.  Trim any bushes or trees on the path to your home.  Use bright outdoor lighting.  Clear any walking paths of anything that might make someone trip, such as rocks or tools.  Regularly check to see if handrails are loose or broken. Make sure that both sides of any steps have handrails.  Any raised decks and porches should have guardrails on the edges.  Have any leaves, snow, or ice cleared regularly.  Use sand or salt on walking paths during winter.  Clean up any spills in your garage right away. This includes oil or grease spills. What can I do in the bathroom?  Use night lights.  Install grab bars by the toilet and in the tub and shower. Do not use towel bars as grab bars.  Use non-skid mats or decals in the tub or shower.  If you need to sit down in the shower, use a plastic, non-slip stool.  Keep the floor dry. Clean up any water that spills on the floor as soon as it happens.  Remove soap buildup in the tub or shower regularly.  Attach bath mats securely with double-sided non-slip rug  tape.  Do not have throw rugs and other things on the floor that can make you trip. What can I do in the bedroom?  Use night lights.  Make sure that you have a light by your bed that is easy to reach.  Do not use any sheets or blankets that are too big for your bed. They should not hang down onto the floor.  Have a firm chair that has side arms. You can use this for support while you get dressed.  Do not have throw rugs and other things on the floor that can make you trip. What can I do in the kitchen?  Clean up any spills right away.  Avoid walking on wet floors.  Keep items that you use a lot in easy-to-reach places.  If you need to reach something above you, use a strong step stool that has a grab bar.  Keep electrical cords out of the way.  Do not use floor polish or wax that makes floors slippery. If you must use wax, use non-skid floor wax.  Do not have throw rugs and other things on the floor that can make you trip. What can I do with my stairs?  Do not leave any items on the stairs.  Make sure that there are handrails on both sides of the stairs and use them. Fix handrails that are broken or loose. Make sure that handrails are as long as the stairways.  Check any carpeting to make sure that it is firmly attached to the stairs. Fix any carpet that is loose or worn.  Avoid having throw rugs at the top or bottom of the stairs. If you do have throw rugs, attach them to the floor with carpet tape.  Make sure that you have a light switch at the top of the stairs and the bottom of the stairs. If you do not have them, ask someone to add them for you. What else can I do to help prevent falls?  Wear shoes that:  Do not have high heels.  Have rubber bottoms.  Are comfortable and fit you well.  Are closed at the toe. Do not wear sandals.  If you use a stepladder:  Make sure that it is fully opened. Do not climb a closed stepladder.  Make sure that both sides of the  stepladder are locked into place.  Ask someone to hold it for you, if possible.  Clearly mark and make sure that you can see:  Any grab bars or handrails.  First and last steps.  Where the edge of each step is.  Use tools that help you move around (mobility aids) if they are needed. These include:  Canes.  Walkers.  Scooters.  Crutches.  Turn on the lights when you go into a dark area. Replace any light bulbs as soon as they burn out.  Set up your furniture so you have a clear path. Avoid moving your furniture around.  If any of your floors are uneven, fix them.  If there are any pets around you, be aware of where they are.  Review your medicines with your doctor. Some medicines can make you feel dizzy. This can increase your chance of falling. Ask your doctor what other things that you can do to help prevent falls. This information is not intended to replace advice given to you by your health care provider. Make sure you discuss any questions you have with your health care provider. Document Released: 10/15/2008 Document Revised: 05/27/2015 Document Reviewed: 01/23/2014 Elsevier Interactive Patient Education  2017 [employer].

## 2017-06-05 NOTE — Progress Notes (Signed)
Subjective:   [name] is a 84 y.o. female who presents for an Initial Medicare Annual Wellness Visit at [facility]       Objective:    Today's Vitals   06/05/17 1136  BP: 118/62  Pulse: 66  Temp: 98.1 F (36.7 C)  TempSrc: Oral  SpO2: 99%  Weight: 132 lb (59.9 kg)  Height: 5\' 5"  (1.651 m)   Body mass index is 21.97 kg/m.  Advanced Directives 06/05/2017 05/14/2017 03/12/2017 03/07/2017 02/20/2017 01/16/2017 12/08/2016  Does Patient Have a Medical Advance Directive? Yes Yes Yes Yes Yes Yes Yes  Type of Advance Directive Out of facility DNR (pink MOST or yellow form) Out of facility DNR (pink MOST or yellow form) Out of facility DNR (pink MOST or yellow form) Out of facility DNR (pink MOST or yellow form) Out of facility DNR (pink MOST or yellow form) Out of facility DNR (pink MOST or yellow form) Out of facility DNR (pink MOST or yellow form)  Does patient want to make changes to medical advance directive? No - Patient declined No - Patient declined No - Patient declined No - Patient declined No - Patient declined No - Patient declined No - Patient declined  Copy of Healthcare Power of Attorney in Chart? No - copy requested No - copy requested No - copy requested No - copy requested No - copy requested No - copy requested No - copy requested  Would patient like information on creating a medical advance directive? - No - Patient declined No - Patient declined No - Patient declined No - Patient declined No - Patient declined No - Patient declined    Current Medications (verified) Outpatient Encounter Medications as of 06/05/2017  Medication Sig  . acetaminophen (TYLENOL) 325 MG tablet Take 650 mg by mouth every 4 (four) hours as needed.   Marland Kitchen amiodarone (PACERONE) 200 MG tablet Take 0.5 tablets (100 mg total) by mouth daily.  Marland Kitchen amLODipine (NORVASC) 5 MG tablet Take 5 mg by mouth daily.  Marland Kitchen aspirin EC 81 MG tablet Take 1 tablet (81 mg total) by mouth daily.  Marland Kitchen  atorvastatin (LIPITOR) 20 MG tablet Take 20 mg by mouth every other day. Take 1 tablet (20 mg) by mouth every other night  . docusate sodium (COLACE) 100 MG capsule Take 100 mg by mouth daily.  . furosemide (LASIX) 20 MG tablet Take 20 mg by mouth daily.  Marland Kitchen levothyroxine (SYNTHROID, LEVOTHROID) 100 MCG tablet Take 100 mcg by mouth daily before breakfast.  . ranitidine (ZANTAC) 150 MG tablet Take 150 mg by mouth daily.   Marland Kitchen senna (SENOKOT) 8.6 MG TABS tablet Take 2 tablets by mouth daily.    No facility-administered encounter medications on file as of 06/05/2017.     Allergies (verified) Patient has no known allergies.   History: Past Medical History:  Diagnosis Date  . Acid reflux   . Carotid artery occlusion    minimal  . Coronary artery disease   . Hypercholesteremia   . Hypertension   . Myocardial infarct (HCC)   . PAF (paroxysmal atrial fibrillation) (HCC)   . Skin disorder   . Systolic murmur   . Thyroid disease    Past Surgical History:  Procedure Laterality Date  . ABDOMINAL HYSTERECTOMY    . AMPUTATION Right 01/05/2016   Procedure: RIGHT GREAT TOE AMPUTATION;  Surgeon: Nada Libman, MD;  Location: [facility];  Service: Vascular;  Laterality: Right;  . CARDIAC CATHETERIZATION    .  CARDIOVERSION  2012  . IR GENERIC HISTORICAL  12/30/2015   IR ANGIOGRAM EXTREMITY BILATERAL 12/30/2015 [facility]  . IR GENERIC HISTORICAL  12/30/2015   IR ANGIOGRAM SELECTIVE EACH ADDITIONAL VESSEL 12/30/2015 [facility]  . LOWER EXTREMITY ANGIOGRAM Right 12/30/2015   Procedure: RIGHT LOWER EXTREMITY ANGIOGRAM;  Surgeon: Ancil LinseyJason Evan Davis, MD;  Location: [facility];  Service: Vascular;  Laterality: Right;  accessed through left groin area - dressed with two 2x2 and 4x4 opsite  . PERIPHERAL VASCULAR CATHETERIZATION N/A 01/04/2016   Procedure: Lower Extremity Angiography;  Surgeon: Nada LibmanVance W Brabham, MD;  [facility];  Service: Cardiovascular;  Laterality: N/A;  . PERIPHERAL  VASCULAR CATHETERIZATION Right 01/04/2016   Procedure: Peripheral Vascular Atherectomy;  Surgeon: Nada LibmanVance W Brabham, MD;  Location: [facility] INVASIVE CV LAB;  Service: Cardiovascular;  Laterality: Right;  Peroneal  . PERIPHERAL VASCULAR CATHETERIZATION N/A 02/01/2016   Procedure: Abdominal Aortogram w/Lower Extremity;  Surgeon: Nada LibmanVance W Brabham, MD;  Location: [facility] INVASIVE CV LAB;  Service: Cardiovascular;  Laterality: N/A;  . PERIPHERAL VASCULAR CATHETERIZATION  02/01/2016   Procedure: Peripheral Vascular Atherectomy;  Surgeon: Nada LibmanVance W Brabham, MD;  Location: [facility] INVASIVE CV LAB;  Service: Cardiovascular;;  Left   . THYROID SURGERY     Family History  Family history unknown: Yes   Social History   Socioeconomic History  . Marital status: Widowed    Spouse name: Not on file  . Number of children: Not on file  . Years of education: Not on file  . Highest education level: Not on file  Occupational History  . Not on file  Social Needs  . Financial resource strain: Not hard at all  . Food insecurity:    Worry: Never true    Inability: Never true  . Transportation needs:    Medical: No    Non-medical: No  Tobacco Use  . Smoking status: Never Smoker  . Smokeless tobacco: Never Used  Substance and Sexual Activity  . Alcohol use: No    Alcohol/week: 0.0 oz  . Drug use: No  . Sexual activity: Never  Lifestyle  . Physical activity:    Days per week: 0 days    Minutes per session: 0 min  . Stress: Not at all  Relationships  . Social connections:    Talks on phone: Twice a week    Gets together: Twice a week    Attends religious service: Never    Active member of club or organization: No    Attends meetings of clubs or organizations: Never    Relationship status: Widowed  Other Topics Concern  . Not on file  Social History Narrative  . Not on file    Tobacco Counseling Counseling given: Not Answered   Clinical Intake:  Pre-visit preparation completed: No  Pain : No/denies  pain     Nutritional Risks: None Diabetes: No  How often do you need to have someone help you when you read instructions, pamphlets, or other written materials from your doctor or pharmacy?: 2 - Rarely  Interpreter Needed?: No  Information entered by :: Tyron RussellSara Deshawn Skelley, RN   Activities of Daily Living In your present state of health, do you have any difficulty performing the following activities: 06/05/2017  Hearing? N  Vision? N  Difficulty concentrating or making decisions? N  Walking or climbing stairs? Y  Dressing or bathing? N  Doing errands, shopping? N  Preparing Food and eating ? Y  Using the Toilet? Y  In  the past six months, have you accidently leaked urine? N  Do you have problems with loss of bowel control? N  Managing your Medications? Y  Managing your Finances? Y  Housekeeping or managing your Housekeeping? Y  Some recent data might be hidden     Immunizations and Health Maintenance Immunization History  Administered Date(s) Administered  . Influenza-Unspecified 10/06/2016  . Pneumococcal Conjugate-13 08/24/2016  . Tdap 09/14/2016   There are no preventive care reminders to display for this patient.  Patient Care Team: Mahlon Gammon, MD as PCP - General (Internal Medicine) Wyline Mood [name], MD as PCP - Cardiology (Cardiology) Roena Malady, PA-C as Physician Assistant (Internal Medicine)  Indicate any recent Medical Services you may have received from other than [facility] providers in the past year (date may be approximate).     Assessment:   This is a routine wellness examination for Shannie.  Hearing/Vision screen No exam data present  Dietary issues and exercise activities discussed: Current Exercise Habits: The patient does not participate in regular exercise at present, Exercise limited by: orthopedic condition(s)  Goals    None     Depression Screen PHQ 2/9 Scores 06/05/2017  PHQ - 2 Score 0    Fall Risk Fall Risk  06/05/2017  Falls in  the past year? No    Is the patient's home free of loose throw rugs in walkways, pet beds, electrical cords, etc?   yes      Grab bars in the bathroom? yes      Handrails on the stairs?   yes      Adequate lighting?   yes  Cognitive Function:     6CIT Screen 06/05/2017  What Year? 4 points  What month? 0 points  What time? 0 points  Count back from 20 4 points  Months in reverse 4 points  Repeat phrase 6 points  Total Score 18    Screening Tests Health Maintenance  Topic Date Due  . DEXA SCAN  06/14/2017 (Originally 02/09/1985)  . INFLUENZA VACCINE  08/02/2017  . PNA vac Low Risk Adult (2 of 2 - PPSV23) 08/24/2017  . TETANUS/TDAP  09/15/2026    Qualifies for Shingles Vaccine? Not in past records  Cancer Screenings: Lung: Low Dose CT Chest recommended if Age 43-80 years, 30 pack-year currently smoking OR have quit w/in 15years. Patient does not qualify. Breast: Up to date on Mammogram? Yes   Up to date of Bone Density/Dexa? Excluded due to age and nonambulatory Colorectal: up to date  Additional Screenings:  Hepatitis C Screening: declined     Plan:    I have personally reviewed and addressed the Medicare Annual Wellness questionnaire and have noted the following in the patient's chart:  A. Medical and social history B. Use of alcohol, tobacco or illicit drugs  C. Current medications and supplements D. Functional ability and status E.  Nutritional status F.  Physical activity G. Advance directives H. List of other physicians I.  Hospitalizations, surgeries, and ER visits in previous 12 months J.  Vitals K. Screenings to include hearing, vision, cognitive, depression L. Referrals and appointments - none  In addition, I have reviewed and discussed with patient certain preventive protocols, quality metrics, and best practice recommendations. A written personalized care plan for preventive services as well as general preventive health recommendations were provided to  patient.  See attached scanned questionnaire for additional information.   Signed,   Tyron Russell, RN Nurse Health Advisor  Patient Concerns: None

## 2017-07-05 ENCOUNTER — Non-Acute Institutional Stay (SKILLED_NURSING_FACILITY): Payer: Medicare Other | Admitting: Internal Medicine

## 2017-07-05 ENCOUNTER — Encounter: Payer: Self-pay | Admitting: Internal Medicine

## 2017-07-05 DIAGNOSIS — I10 Essential (primary) hypertension: Secondary | ICD-10-CM

## 2017-07-05 DIAGNOSIS — I480 Paroxysmal atrial fibrillation: Secondary | ICD-10-CM

## 2017-07-05 DIAGNOSIS — H6123 Impacted cerumen, bilateral: Secondary | ICD-10-CM | POA: Diagnosis not present

## 2017-07-05 DIAGNOSIS — N183 Chronic kidney disease, stage 3 unspecified: Secondary | ICD-10-CM

## 2017-07-05 DIAGNOSIS — R600 Localized edema: Secondary | ICD-10-CM

## 2017-07-05 NOTE — Progress Notes (Signed)
This is an acute visit.  Level of care is skilled.  Facility is [facility].  Chief complaint-acute visit secondary to possible cerumen impaction.  History of present illness.  Patient is a pleasant 84 year old female who is a long-term resident of facility  She has a history of atrial fibrillation as well as hypertension and coronary artery disease-hyperlipidemia-peripheral arterial disease as well as status post right great toe amputation- in addition to chronic renal insufficiency hypothyroidism and grade 2 diastolic CHF   she has been quite stable does have some history of wax impaction and  her son is concerned saying she appears to be increasingly hard of hearing---  does not complain of any pain or discomfort  Blood pressure is somewhat elevated today with a systolic 158 however she does- per review of previous blood pressures does appear to have some elevations 149/4- 157/68 will order more frequent blood pressure checks daily sure this is not consistent she is on Norvasc 5 mg a day in addition to Lasix 20 mg a day  She also has a history of atrial fibrillation this appears rate controlled on amiodarone she is on aspirin for anticoagulation.  History of grade 2 diastolic CHF which appears stable has gained some weight over the past couple months but this is stabilized over the past month- weight gain was thought to be largely appetite related- she is again on low-dose Lasix.  Edema appears to be fairly minimal she has compression hose on   Past Medical History:  Diagnosis Date  . Acid reflux   . Carotid artery occlusion    minimal  . Coronary artery disease   . Hypercholesteremia   . Hypertension   . Myocardial infarct (HCC)   . PAF (paroxysmal atrial fibrillation) (HCC)   . Skin disorder   . Systolic murmur   . Thyroid disease         Past Surgical History:  Procedure Laterality Date  . ABDOMINAL HYSTERECTOMY    . AMPUTATION Right 01/05/2016    Procedure: RIGHT GREAT TOE AMPUTATION;  Surgeon: Nada LibmanVance W Brabham, MD;  [facility];  Service: Vascular;  Laterality: Right;  . CARDIAC CATHETERIZATION    . CARDIOVERSION  2012  . IR GENERIC HISTORICAL  12/30/2015   IR ANGIOGRAM EXTREMITY BILATERAL 12/30/2015 [facility]  . IR GENERIC HISTORICAL  12/30/2015   IR ANGIOGRAM SELECTIVE EACH ADDITIONAL VESSEL 12/30/2015 [facility]  . LOWER EXTREMITY ANGIOGRAM Right 12/30/2015   Procedure: RIGHT LOWER EXTREMITY ANGIOGRAM;  Surgeon: Ancil LinseyJason Evan Davis, MD;  Location: [facility];  Service: Vascular;  Laterality: Right;  accessed through left groin area - dressed with two 2x2 and 4x4 opsite  . PERIPHERAL VASCULAR CATHETERIZATION N/A 01/04/2016   Procedure: Lower Extremity Angiography;  Surgeon: Nada LibmanVance W Brabham, MD;  [facility];  Service: Cardiovascular;  Laterality: N/A;  . PERIPHERAL VASCULAR CATHETERIZATION Right 01/04/2016   Procedure: Peripheral Vascular Atherectomy;  Surgeon: Nada LibmanVance W Brabham, MD;  Location: [facility] IN[facility] CV LAB;  Service: Cardiovascular;  Laterality: Right;  Peroneal  . PERIPHERAL VASCULAR CATHETERIZATION N/A 02/01/2016   Procedure: Abdominal Aortogram w/Lower Extremity;  Surgeon: Nada LibmanVance W Brabham, MD;  Location: [facility] INVAS[facility] CV LAB;  Service: Cardiovascular;  Laterality: N/A;  . PERIPHERAL VASCULAR CATHETERIZATION  02/01/2016   Procedure: Peripheral Vascular Atherectomy;  Surgeon: Nada LibmanVance W Brabham, MD;  Location: [facility] CV LAB;  Service: Cardiovascular;;  Left   . THYROID SURGERY      No Known Allergies  Medication Sig  . acetaminophen (TYLENOL) 325 MG tablet Take 650 mg by mouth every 4 (four) hours as needed.   Marland Kitchen amiodarone (PACERONE) 200 MG tablet Take 0.5 tablets (100 mg total) by mouth daily.  Marland Kitchen amLODipine (NORVASC) 5 MG tablet Take 5 mg by mouth daily.  Marland Kitchen aspirin EC 81 MG tablet Take 1 tablet (81 mg total) by mouth daily.  Marland Kitchen atorvastatin (LIPITOR) 20 MG tablet Take 20 mg  by mouth every other day. Take 1 tablet (20 mg) by mouth every other night  . docusate sodium (COLACE) 100 MG capsule Take 100 mg by mouth daily.  . furosemide (LASIX) 20 MG tablet Take 20 mg by mouth daily.  Marland Kitchen levothyroxine (SYNTHROID, LEVOTHROID) 100 MCG tablet Take 100 mcg by mouth daily before breakfast.  . ranitidine (ZANTAC) 150 MG tablet Take 150 mg by mouth daily.   Marland Kitchen senna (SENOKOT) 8.6 MG TABS tablet Take 2 tablets by mouth daily.   . [DISCONTINUED] Amino Acids-Protein Hydrolys (FEEDING SUPPLEMENT, PRO-STAT SUGAR FREE 64,) LIQD Take 30 mLs by mouth 2 (two) times daily between meals.   No facility-administered encounter medications on file as of 06/04/2017.     Review of systems.  In general is not complaining of any fever chills weight appears to be stable nursing does not report any issues.  Skin is not complain of rashes or itching had a history of lower extremity wounds which have healed largely.  Head ears eyes nose mouth and throat has prescription lenses is not complaining of any visual changes or sore throat.  She is somewhat hard of hearing and there are suspicion she has another wax impaction.  She is not complaining of any ear pain-.  Respiratory is not complaining of shortness of breath or cough.  Cardiac is not complaining of chest pain has quite mild lower extremity edema.  GI is not complaining of abdominal pain nausea vomiting diarrhea or constipation.  Musculoskeletal does not complain of joint pain at this time.  Neurologic does not complain of any headache dizziness or syncopal type feelings.  Psych does not complain of overt anxiety or depression however whenever examined she does appear to be somewhat anxious  Physical exam.  Temperature is 97.4 pulse 68 respirations 20 blood pressure 158/63-weight is 133 pounds- this is relatively stable with last month weight   In general this is a pleasant elderly female in no distress.  Her skin is warm and  dry.  Eyes she has prescription lenses visual acuity appears to be intact sclera and conjunctive are clear  Ears she does have fairly significant wax buildup in her right ear tympanic membrane is partially visualized and pearly gray.  Left ear the tympanic membrane is occluded by wax I do not see any evidence of drainage or bleeding  Oropharynx is clear mucous membranes moist.  Chest is clear to auscultation there is no labored breathing    Heart is largely regular rate and rhythm without murmur gallop or rub again she has quite mild lower extremity edema compression hose are applied  Abdomen is soft nontender with positive bowel sounds.  Musculoskeletal l is able to move all her extremities at baseline she is ambulatory in wheelchair and does well  Neurologic is grossly intact her speech is clear no lateralizing findings.    Psych she is pleasant and appropriate mild cognitive deficits but does very well in this setting  Labs.  June 05, 2017  Sodium is 142 potassium 3.7 BUN 25 creatinine 1.3.  WBC 4.4 hemoglobin 13.8 platelets 247   Assessment and plan.  1.-  Cerumen impaction bilaterally -this appears fairly significant apparently has not really responded that well to Debrox in the past- this was discussed with nursing and will order an ENT consult She is not complaining of any discomfort-  #2 history of atrial fibrillation this appears rate controlled- rhythm was actually regular on exam today- she is on amiodarone for rate control-on aspirin for anticoagulation  3.  History of  Edema-- diastolic CHF-edema this appears to be quite stable-weight is stable- she is on low-dose Lasix--will update a BMP to ensure renal function electrolytes are stable  4- history of chronic renal insufficiency this appears relatively baseline with a creatinine of 1.3 BUN of 25 on lab done last month  #5 hypertension-this appears to be somewhat of a challenge with patient's anxiety when  examined- systolic is somewhat elevated in the 150s today although this is not unusual when she is being examined will order blood pressure checks daily for a week to see about consistency here.  She is on Norvasc 5 mg a day in addition to low-dose Norvasc  [phone]

## 2017-07-06 ENCOUNTER — Other Ambulatory Visit (HOSPITAL_COMMUNITY)
Admission: RE | Admit: 2017-07-06 | Discharge: 2017-07-06 | Disposition: A | Discharge status: Home / Self Care | Payer: Medicare Other | Source: Skilled Nursing Facility | Attending: Internal Medicine | Admitting: Internal Medicine

## 2017-07-06 DIAGNOSIS — I5022 Chronic systolic (congestive) heart failure: Secondary | ICD-10-CM | POA: Diagnosis present

## 2017-07-06 LAB — BASIC METABOLIC PANEL
ANION GAP: 9 (ref 5–15)
BUN: 30 mg/dL — ABNORMAL HIGH (ref 8–23)
CALCIUM: 9 mg/dL (ref 8.9–10.3)
CO2: 29 mmol/L (ref 22–32)
Chloride: 107 mmol/L (ref 98–111)
Creatinine, Ser: 1.22 mg/dL — ABNORMAL HIGH (ref 0.44–1.00)
GFR calc non Af Amer: 36 mL/min — ABNORMAL LOW (ref >60)
GFR, EST AFRICAN AMERICAN: 42 mL/min — AB (ref >60)
GLUCOSE: 79 mg/dL (ref 70–99)
POTASSIUM: 3.7 mmol/L (ref 3.5–5.1)
Sodium: 145 mmol/L (ref 135–145)

## 2017-07-16 ENCOUNTER — Ambulatory Visit (INDEPENDENT_AMBULATORY_CARE_PROVIDER_SITE_OTHER): Payer: Medicare Other | Admitting: Family

## 2017-07-16 ENCOUNTER — Ambulatory Visit: Payer: Medicare Other | Admitting: Family

## 2017-07-16 ENCOUNTER — Encounter: Payer: Self-pay | Admitting: Family

## 2017-07-16 ENCOUNTER — Ambulatory Visit (HOSPITAL_COMMUNITY)
Admission: RE | Admit: 2017-07-16 | Discharge: 2017-07-16 | Disposition: A | Discharge status: Home / Self Care | Payer: Medicare Other | Source: Ambulatory Visit | Attending: Family | Admitting: Family

## 2017-07-16 VITALS — BP 164/71 | HR 59 | Temp 97.4°F | Resp 16 | Ht 65.0 in | Wt 132.0 lb

## 2017-07-16 DIAGNOSIS — Z9862 Peripheral vascular angioplasty status: Secondary | ICD-10-CM

## 2017-07-16 DIAGNOSIS — I83004 Varicose veins of unspecified lower extremity with ulcer of heel and midfoot: Secondary | ICD-10-CM | POA: Insufficient documentation

## 2017-07-16 DIAGNOSIS — L97402 Non-pressure chronic ulcer of unspecified heel and midfoot with fat layer exposed: Secondary | ICD-10-CM

## 2017-07-16 DIAGNOSIS — I779 Disorder of arteries and arterioles, unspecified: Secondary | ICD-10-CM | POA: Insufficient documentation

## 2017-07-16 DIAGNOSIS — Z89411 Acquired absence of right great toe: Secondary | ICD-10-CM | POA: Diagnosis not present

## 2017-07-16 NOTE — Patient Instructions (Signed)

## 2017-07-16 NOTE — Progress Notes (Signed)
[facility] [place]   CC: Follow up peripheral artery occlusive disease  History of Present Illness Floy Huston FoleyM Wingert is a 84 y.o. female who is s/p atherectomy right peroneal artery, angioplasty of the right peroneal artery and tibioperoneal trunk on 01/04/16 and then underwent right great toe amputation on 01/05/16 by Dr. Myra GianottiBrabham.  I last evaluated pt on7-5-18. At that time bilateral heel and lateral right foot ulcerswere granulating and contractingslowly. Ulcers are probably secondary to both venous stasis and peripheral artery disease.  She hadminimal edema in her lower legs and feet.  Daily seated leg and arm exercisesas discussed with pt and son and demonstrated, NH staff to supervise this daily. Stop Santyl; start daily Hydrogel dressing changes to feet ulcers.   [facility] is [facility] at [facility].She states that she is walking with a walker minimally as she needs to keep pressure off of her heels and feet, therefore cannot wear shoes, but over alldoing well. Physical therapy is supervising her walking.  She seems to be doingdaily seated leg exercises as advised.  Pt denies fever or chills, denies pain.   Pt Diabetic: No Pt smoker: non-smoker  Pt meds include: Statin :Yes Betablocker: No ASA: Yes Other anticoagulants/antiplatelets: no     Past Medical History:  Diagnosis Date  . Acid reflux   . Carotid artery occlusion    minimal  . Coronary artery disease   . Hypercholesteremia   . Hypertension   . Myocardial infarct (HCC)   . PAF (paroxysmal atrial fibrillation) (HCC)   . Skin disorder   . Systolic murmur   . Thyroid disease     Social History Social History   Tobacco Use  . Smoking status: Never Smoker  . Smokeless tobacco: Never Used  Substance Use Topics  . Alcohol use: No    Alcohol/week: 0.0 oz  . Drug use: No    Family History Family History  Family history unknown: Yes    Past Surgical History:  Procedure  Laterality Date  . ABDOMINAL HYSTERECTOMY    . AMPUTATION Right 01/05/2016   Procedure: RIGHT GREAT TOE AMPUTATION;  Surgeon: Nada LibmanVance W Brabham, MD;  [facility];  Service: Vascular;  Laterality: Right;  . CARDIAC CATHETERIZATION    . CARDIOVERSION  2012  . IR GENERIC HISTORICAL  12/30/2015   IR ANGIOGRAM EXTREMITY BILATERAL 12/30/2015 [facility]  . IR GENERIC HISTORICAL  12/30/2015   IR ANGIOGRAM SELECTIVE EACH ADDITIONAL VESSEL 12/30/2015 [facility]  . LOWER EXTREMITY ANGIOGRAM Right 12/30/2015   Procedure: RIGHT LOWER EXTREMITY ANGIOGRAM;  Surgeon: Ancil LinseyJason Evan Davis, MD;  Location: [facility];  Service: Vascular;  Laterality: Right;  accessed through left groin area - dressed with two 2x2 and 4x4 opsite  . PERIPHERAL VASCULAR CATHETERIZATION N/A 01/04/2016   Procedure: Lower Extremity Angiography;  Surgeon: Nada LibmanVance W Brabham, MD;  [facility];  Service: Cardiovascular;  Laterality: N/A;  . PERIPHERAL VASCULAR CATHETERIZATION Right 01/04/2016   Procedure: Peripheral Vascular Atherectomy;  Surgeon: Nada LibmanVance W Brabham, MD;  [facility];  Service: Cardiovascular;  Laterality: Right;  Peroneal  . PERIPHERAL VASCULAR CATHETERIZATION N/A 02/01/2016   Procedure: Abdominal Aortogram w/Lower Extremity;  Surgeon: Nada LibmanVance W Brabham, MD;  [facility];  Service: Cardiovascular;  Laterality: N/A;  . PERIPHERAL VASCULAR CATHETERIZATION  02/01/2016   Procedure: Peripheral Vascular Atherectomy;  Surgeon: Nada LibmanVance W Brabham, MD;  Location: [facility];  Service: Cardiovascular;;  Left   . THYROID SURGERY  No Known Allergies  No current outpatient medications on file.   No current facility-administered medications for this visit.     ROS: See HPI for pertinent positives and negatives.   Physical Examination  Vitals:   07/16/17 1510 07/16/17 1511  BP: (!) 158/69 (!) 164/71  Pulse: (!) 59   Resp: 16   Temp: (!) 97.4 F (36.3 C)   TempSrc:  Oral   SpO2: 100%   Weight: 132 lb (59.9 kg)   Height: 5\' 5"  (1.651 m)    Body mass index is 21.97 kg/m.  General: A&O x 3, WDWN, thin elderly female that appears younger than her stated age. Gait:  seated in w/c Eyes: PERRLA. Pulmonary: Respirations are non labored, CTAB, good air movement in all fields Cardiac: regular rhythm and rate, no detected murmur.    Carotid Bruits Right Left   Negative Negative   Abdominal aortic pulse is notpalpable. Radial pulses: are 2+ palpable bilaterally     Left heel    Right foot, lateral aspect                      VASCULAR EXAM: Extremities without ischemic changes, without Gangrene; without open wounds. Left heel ulcer has healed (on 07-06-16 was3.5cm x 2.5 cm),  Rightposteriorheel ulcer has healed. Right foot lateral ulcer continues to granulate and contract (see above photos) (was 2cm x 2cm, about 3-4 mm depth).  Right great toe is surgically absent, this site is well healed.   LE Pulses Right Left  FEMORAL palpable palpable   POPLITEAL notpalpable  notpalpable  POSTERIOR TIBIAL notpalpable  notpalpable   DORSALIS PEDIS ANTERIOR TIBIAL notpalpable  notpalpable    Abdomen: soft, NT, no palpable masses. Skin: no rashes, see Extremities.  Musculoskeletal: no muscle wasting or atrophy. Neurologic: A&O X 3; Appropriate Affect ; SENSATION: normal; MOTOR FUNCTION: moving all extremities equally, motor strength4/5 throughout. Speech is fluent/normal. CN 2-12 intact except is slightly hard of hearing    ASSESSMENT: Zola MAXCINE STRONG is a 84 y.o. female who is s/p atherectomy of right peroneal artery, angioplasty of the right peroneal artery and tibioperoneal trunk on 01/04/16 and then underwent right great toe amputation on 01/05/16.   Bilateral heel and lateral right foot ulcers are granulating and contractingslowly, see  Plan.The right posterior heel ulcer has heeled. Ulcers are probably secondary to both venous stasis and peripheral artery disease.  She hasnoedema in her lower legs and feet.   Pt continues her daily seated leg exercises.  She is remarkably sharp and motivated for age 72.   Her feet ulcers are slowly heeling, this will continue to be a slow process. Walking with physical therapy is encouraged.  Kudos to [facility] staff for facilitating healing of the feet ulcers.     DATA  ABI (Date: 07/16/2017):  R:   ABI: 0.90 (was 0.89 on 01-08-17),   PT: absent  DP: mono  TBI:  Surgically absent great toe  L:   ABI: 0.91 (was 0.96),   PT: mono  DP: mono  TBI: 0.79, toe pressure 136 (was 0.85) Stable bilateral ABI with mild disease, all monophasic waveforms, except right PT is absent. ABI's may be falsely elevated.  Normal left TBI.    Bilateral LE arterial Duplex (02-28-16): Technically difficult exam due to patient's inability to be properly positioned and calcific plaque throughout bilaterally preventing thorough visualization.  Unable to visualize a focal stenosis in the right LE, color flow disturbance noted throughout, with doubling of  velocity in the distal SFA suggestive of 50-74% stenosis. Unable to visualize a focal stenosis in the left LE, color flow disturbance noted throughout with an increased velocity in the mid SFA in the 50-77% range.  All waveforms are monophasic except bilateral common femoral artery which are biphasic.      PLAN: Continue daily seated leg and arm exercisesas discussed with pt and son and demonstrated, NH staff to supervise this daily. Keep pressure off heels and right lateral foot prominence.   Based on the patient's vascular studies and examination, pt will return to clinic in1 yearwithbilateral feet ulcer evaluation and ABI's. May ambulate with physical therapy.  I advised pt and her son to notify us if she develops ulcers in her lower  extremities.    I discussed in depth with the patient the nature of atherosclerosis, and emphasized the importance of maximal medical management including strict control of blood pressure, blood glucose, and lipid levels, obtaining regular exercise, and continued cessation of smoking.  The patient is aware that without maximal medical management the underlying atherosclerotic disease process will progress, limiting the benefit of any interventions.  The patient was given information about PAD including signs, symptoms, treatment, what symptoms should prompt the patient to seek immediate medical care, and risk reduction measures to take.  Charisse March, RN, MSN, FNP-C [facility] [place] Phone: [phone]  Clinic MD: Myra Gianotti  07/16/17 3:47 PM

## 2017-08-07 ENCOUNTER — Non-Acute Institutional Stay (SKILLED_NURSING_FACILITY): Payer: Medicare Other | Admitting: Internal Medicine

## 2017-08-07 ENCOUNTER — Encounter: Payer: Self-pay | Admitting: Internal Medicine

## 2017-08-07 DIAGNOSIS — N183 Chronic kidney disease, stage 3 unspecified: Secondary | ICD-10-CM

## 2017-08-07 DIAGNOSIS — I10 Essential (primary) hypertension: Secondary | ICD-10-CM | POA: Diagnosis not present

## 2017-08-07 DIAGNOSIS — I2510 Atherosclerotic heart disease of native coronary artery without angina pectoris: Secondary | ICD-10-CM | POA: Diagnosis not present

## 2017-08-07 DIAGNOSIS — I779 Disorder of arteries and arterioles, unspecified: Secondary | ICD-10-CM

## 2017-08-07 DIAGNOSIS — I480 Paroxysmal atrial fibrillation: Secondary | ICD-10-CM | POA: Diagnosis not present

## 2017-08-07 NOTE — Progress Notes (Signed)
Location:   [facility] Room Number: 111/W Place of Service:  SNF [phone]) Provider:[name] Chales AbrahamsGupta MD  [name], [name] L, MD  Patient Care Team: [name], [name] L, MD as PCP - General (Internal Medicine) [name], Dorothe PeaJonathan F, MD as PCP - Cardiology (Cardiology) Synetta ShadowLassen, Imojean Yoshino C, PA-C as Physician Assistant (Internal Medicine)  Extended Emergency Contact Information Primary Emergency Contact: [name],[name]  [place] Home Phone: [phone] Mobile Phone: [phone] Relation: None Secondary Emergency Contact: [name],April  [place] Home Phone: [phone] Relation: None  Code Status:  DNR Goals of care: Advanced Directive information Advanced Directives 08/07/2017  Does Patient Have a Medical Advance Directive? Yes  Type of Advance Directive Out of facility DNR (pink MOST or yellow form)  Does patient want to make changes to medical advance directive? No - Patient declined  Copy of Healthcare Power of Attorney in Chart? No - copy requested  Would patient like information on creating a medical advance directive? No - Patient declined     Chief Complaint  Patient presents with  . Medical Management of Chronic Issues    Patient is being seen for routine visit of Medical Management   Management of chronic medical conditions including atrial fibrillation- peripheral vascular disease- coronary artery disease-hypertension- renal insufficiency-diastolic CHF as well as hypothyroidism-and anemia  HPI:  Pt is a 84 y.o. female seen today for medical management of chronic diseases.  As noted above.  She continues to be quite stable-  She does have a history of peripheral vascular disease and is followed by vascular- one point she had numerous lower extremity wounds- but these have largely healed- she does have a small ulcer on her left heel and a healed ulcer with some residual crusting on her lateral right foot.   She is s/p atherectomy  right peroneal artery, angioplasty of the right peroneal artery and tibioperoneal trunk on 01/04/16 and then underwent right great toe amputation on 01/05/16 by Dr. Myra GianottiBrabham.  Regards to cardiac issues these have been stable for some time- he does have a history of hypertension is on Norvasc and low-dose Lasix- she appears to have some whitecoat hypertension when I take it it usually elevated I got 160/80 today-but when nursing takes it systolics are usually 140 or below.  She is on aspirin and a statin with a history of coronary artery disease but does not really complain of any chest pain.  Her LDL was 81 on September lab.  She also has a history of diastolic CHF but edema is fairly minimal she is on low-dose Lasix as noted above weight appears to be stable.  She was seen recently for increased difficulty hearing with suspicions she had wax in her ears- she has not really responded to previous courses of Debrox and an ENT consult is pending  She is not really complaining of any ear pain   She also has a history of anemia at one point had been on iron but this was discontinued sometime ago hemoglobin in June was 13.8- will update this to ensure stability  Currently she is sitting in her wheelchair comfortably has no complaints- vital signs appear to be stable I see her listed blood pressure is 119/67          Past Medical History:  Diagnosis Date  . Acid reflux   . Carotid artery occlusion    minimal  . Coronary artery disease   . Hypercholesteremia   . Hypertension   . Myocardial infarct (  HCC)   . PAF (paroxysmal atrial fibrillation) (HCC)   . Skin disorder   . Systolic murmur   . Thyroid disease    Past Surgical History:  Procedure Laterality Date  . ABDOMINAL HYSTERECTOMY    . AMPUTATION Right 01/05/2016   Procedure: RIGHT GREAT TOE AMPUTATION;  Surgeon: Nada Libman, MD;  Location: [facility] OR;  Service: Vascular;  Laterality: Right;  . CARDIAC CATHETERIZATION    .  CARDIOVERSION  2012  . IR GENERIC HISTORICAL  12/30/2015   IR ANGIOGRAM EXTREMITY BILATERAL 12/30/2015 [facility]  . IR GENERIC HISTORICAL  12/30/2015   IR ANGIOGRAM SELECTIVE EACH ADDITIONAL VESSEL 12/30/2015 [facility]  . LOWER EXTREMITY ANGIOGRAM Right 12/30/2015   Procedure: RIGHT LOWER EXTREMITY ANGIOGRAM;  Surgeon: Ancil Linsey, MD;  Location: [facility];  Service: Vascular;  Laterality: Right;  accessed through left groin area - dressed with two 2x2 and 4x4 opsite  . PERIPHERAL VASCULAR CATHETERIZATION N/A 01/04/2016   Procedure: Lower Extremity Angiography;  Surgeon: Nada Libman, MD;  Location: [facility] INVASIVE CV LAB;  Service: Cardiovascular;  Laterality: N/A;  . PERIPHERAL VASCULAR CATHETERIZATION Right 01/04/2016   Procedure: Peripheral Vascular Atherectomy;  Surgeon: Nada Libman, MD;  Location: [facility] INVASIVE CV LAB;  Service: Cardiovascular;  Laterality: Right;  Peroneal  . PERIPHERAL VASCULAR CATHETERIZATION N/A 02/01/2016   Procedure: Abdominal Aortogram w/Lower Extremity;  Surgeon: Nada Libman, MD;  Location: [facility] INVASIVE CV LAB;  Service: Cardiovascular;  Laterality: N/A;  . PERIPHERAL VASCULAR CATHETERIZATION  02/01/2016   Procedure: Peripheral Vascular Atherectomy;  Surgeon: Nada Libman, MD;  Location: [facility] INVASIVE CV LAB;  Service: Cardiovascular;;  Left   . THYROID SURGERY      No Known Allergies  Outpatient Encounter Medications as of 08/07/2017  Medication Sig  . acetaminophen (TYLENOL) 325 MG tablet Take 650 mg by mouth every 4 (four) hours as needed.   Marland Kitchen amiodarone (PACERONE) 200 MG tablet Take 0.5 tablets (100 mg total) by mouth daily.  Marland Kitchen amLODipine (NORVASC) 5 MG tablet Take 5 mg by mouth daily.  Marland Kitchen aspirin EC 81 MG tablet Take 1 tablet (81 mg total) by mouth daily.  Marland Kitchen atorvastatin (LIPITOR) 20 MG tablet Take 20 mg by mouth every other day. Take 1 tablet (20 mg) by mouth every other night  . collagenase (SANTYL) ointment Apply 1 application topically  daily. Apply to left heel per tx orders  . docusate sodium (COLACE) 100 MG capsule Take 100 mg by mouth daily.  . furosemide (LASIX) 20 MG tablet Take 20 mg by mouth daily.  Marland Kitchen levothyroxine (SYNTHROID, LEVOTHROID) 100 MCG tablet Take 100 mcg by mouth daily before breakfast.  . polyethylene glycol (MIRALAX / GLYCOLAX) packet Take 17 g by mouth daily.  . ranitidine (ZANTAC) 150 MG tablet Take 150 mg by mouth daily.   Marland Kitchen senna (SENOKOT) 8.6 MG TABS tablet Take 2 tablets by mouth daily.    No facility-administered encounter medications on file as of 08/07/2017.      Review of Systems   In general she is not complaining of any fever or chills weight appears to be stable   Skin does not complain of rashes or itching she does have a small skin issues on her feet as noted above.  Head ears eyes nose mouth and throat is not complaining of sore throat or visual changes she has prescription lenses. She is hard of hearing with suspected wax buildup  Respiratory is not complaining of shortness of breath or  cough.  Cardiac does not complain of chest pain has quite minimal lower extremity edema.  GI is not complaining of abdominal pain nausea vomiting diarrhea or constipation per nursing has a good appetite  GU does not complain of dysuria.  Musculoskeletal is not complaining of any joint pain or swelling.  Neurologic does not complain of dizziness headache or feeling syncopal.  Psych she does not complain of being depressed or anxious I believe she gets a bit anxious when her blood pressure is taken -- this is probably contributing to her higher blood pressure today  Immunization History  Administered Date(s) Administered  . Influenza-Unspecified 10/06/2016  . Pneumococcal Conjugate-13 08/24/2016  . Tdap 09/14/2016   Pertinent  Health Maintenance Due  Topic Date Due  . INFLUENZA VACCINE  09/07/2017 (Originally 08/02/2017)  . DEXA SCAN  09/07/2017 (Originally [birth year]09/1987)  . PNA vac Low Risk  Adult (2 of 2 - PPSV23) 08/24/2017   Fall Risk  06/05/2017  Falls in the past year? No   Functional Status Survey:    Vitals:   08/07/17 1426  BP: 119/67  Pulse: (!) 57  Resp: 20  Temp: (!) 97.1 F (36.2 C)  TempSrc: Oral  SpO2: 99%  Weight: 131 lb 3.2 oz (59.5 kg)  Height: 5\' 5"  (1.651 m)   Body mass index is 21.83 kg/m. Physical Exam   In general this is a pleasant elderly female in no distress sitting comfortably in her wheelchair she appears younger than her stated age  Her skin is warm and dry left heel was wrapped- she haws some granulation tissue on a healed ulcer on right lateral foot I do not see signs of infection  Eyes visual acuity appears to be intact sclera and conjunctive are clear she has prescription lenses  Ears she is hard of hearing- I do note cerumen buildup in both ears although visualization of the tympanic membrane and ear canal was somewhat difficult  Oropharynx is clear mucous membranes moist   Chest is clear to auscultation there is no labored breathing   Heart is largely regular rate and rhythm slightly bradycardic in the high 50s-I cannot appreciate a murmur gallop or rub she has quite minimal lower extremity edema   Abdomen is soft nontender with positive bowel sounds   Musculoskeletal- does move all extremities x4 at baseline largely ambulates in a wheelchair she does have arthritic changes chronic   Neurologic is grossly intact her speech is clear cranial nerves intact no lateralizing findings   Psych-she is largely alert and oriented-suspect confusion often is the result of her hearing difficulties  Labs reviewed: Recent Labs    03/07/17 1450 06/05/17 0748 07/06/17 0830  NA 140 142 145  K 4.0 3.7 3.7  CL 98* 105 107  CO2 28 29 29   GLUCOSE 75 87 79  BUN 38* 25* 30*  CREATININE 1.20* 1.30* 1.22*  CALCIUM 9.2 8.9 9.0   Recent Labs    11/30/16 0748 03/07/17 1450  AST 22 22  ALT 17 15  ALKPHOS 100 117  BILITOT 0.5 0.7   PROT 7.2 7.2  ALBUMIN 3.7 3.7   Recent Labs    12/14/16 1000 03/07/17 1455 06/05/17 0748  WBC 5.8 6.7 4.4  NEUTROABS 3.8 4.7 2.3  HGB 15.7* 15.3* 13.8  HCT 49.1* 47.4* 42.6  MCV 97.4 97.5 95.1  PLT 281 272 247   Lab Results  Component Value Date   TSH 1.565 03/20/2017   No results found for: HGBA1C Lab Results  Component Value Date   CHOL 145 09/19/2016   HDL 41 09/19/2016   LDLCALC 81 09/19/2016   TRIG 116 09/19/2016   CHOLHDL 3.5 09/19/2016    Significant Diagnostic Results in last 30 days:  No results found.  Assessment/Plan  #1 atrial fibrillation-this appears rate controlled on amiodarone she is on aspirin for anticoagulation not on anything more aggressive secondary to advanced age and comorbidities  She is followed by cardiology.  Occasionally has slightly bradycardic readings but is asymptomatic   #2-hypertension again this appears to be controlled although difficult to fully tell on exam because of her side he- she is on Norvasc 5 mg a day as well as low-dose Lasix at this point will monitor per nursing systolics are usually 140 or below  #3-coronary artery disease this appears well controlled on aspirin as well as a statin again her LDL was 81 on September 2018 lab.  4.  History of peripheral arterial disease she is followed by vascular- this is thought to be well controlled she does have a small ulcer on her left heel and some granulation tissue on her right lateral foot-she is receiving Santyl  To the left heel-  #5- history of hypothyroidism TSH in March was within normal limits at 1.565 she is on Synthroid   No. 6 history of diastolic CHF she is appears to be well compensated her weights been stable here for the past 3 months- she is on Lasix 20 mg a day  #7 history of anemia-this appears to be relatively stable hemoglobin was 13.8 on lab done in June-previously had been around 15 will update this to ensure stability she is no longer on iron  #8-  history of suspected wax impaction apparently this has been resistant to topical treatment -- does have an ENT consult approximately a week--she is hard of hearing but is not complaining of any pain  9.  Renal insufficiency this appears to be stable creatinine was 1.22 BUN of 30 on lab done in July will update this as well   [id number]

## 2017-08-08 ENCOUNTER — Encounter (HOSPITAL_COMMUNITY)
Admission: RE | Admit: 2017-08-08 | Discharge: 2017-08-08 | Disposition: A | Discharge status: Home / Self Care | Payer: Medicare Other | Source: Skilled Nursing Facility | Attending: Internal Medicine | Admitting: Internal Medicine

## 2017-08-08 DIAGNOSIS — I5022 Chronic systolic (congestive) heart failure: Secondary | ICD-10-CM | POA: Insufficient documentation

## 2017-08-08 LAB — CBC WITH DIFFERENTIAL/PLATELET
Basophils Absolute: 0 10*3/uL (ref 0.0–0.1)
Basophils Relative: 1 %
Eosinophils Absolute: 0.2 10*3/uL (ref 0.0–0.7)
Eosinophils Relative: 4 %
HCT: 40.3 % (ref 36.0–46.0)
Hemoglobin: 13.4 g/dL (ref 12.0–15.0)
Lymphocytes Relative: 29 %
Lymphs Abs: 1.3 10*3/uL (ref 0.7–4.0)
MCH: 31.3 pg (ref 26.0–34.0)
MCHC: 33.3 g/dL (ref 30.0–36.0)
MCV: 94.2 fL (ref 78.0–100.0)
Monocytes Absolute: 0.6 10*3/uL (ref 0.1–1.0)
Monocytes Relative: 13 %
Neutro Abs: 2.5 10*3/uL (ref 1.7–7.7)
Neutrophils Relative %: 53 %
Platelets: 230 10*3/uL (ref 150–400)
RBC: 4.28 MIL/uL (ref 3.87–5.11)
RDW: 13 % (ref 11.5–15.5)
WBC: 4.6 10*3/uL (ref 4.0–10.5)

## 2017-08-08 LAB — BASIC METABOLIC PANEL
Anion gap: 7 (ref 5–15)
BUN: 33 mg/dL — ABNORMAL HIGH (ref 8–23)
CO2: 27 mmol/L (ref 22–32)
Calcium: 8.6 mg/dL — ABNORMAL LOW (ref 8.9–10.3)
Chloride: 108 mmol/L (ref 98–111)
Creatinine, Ser: 1.29 mg/dL — ABNORMAL HIGH (ref 0.44–1.00)
GFR calc Af Amer: 39 mL/min — ABNORMAL LOW (ref >60)
GFR calc non Af Amer: 34 mL/min — ABNORMAL LOW (ref >60)
Glucose, Bld: 83 mg/dL (ref 70–99)
Potassium: 3.6 mmol/L (ref 3.5–5.1)
Sodium: 142 mmol/L (ref 135–145)

## 2017-08-13 ENCOUNTER — Ambulatory Visit (INDEPENDENT_AMBULATORY_CARE_PROVIDER_SITE_OTHER): Payer: Medicare Other | Admitting: Otolaryngology

## 2017-08-13 DIAGNOSIS — H903 Sensorineural hearing loss, bilateral: Secondary | ICD-10-CM

## 2017-08-13 DIAGNOSIS — H6123 Impacted cerumen, bilateral: Secondary | ICD-10-CM | POA: Diagnosis not present

## 2017-10-25 ENCOUNTER — Non-Acute Institutional Stay (SKILLED_NURSING_FACILITY): Payer: Medicare Other | Admitting: Internal Medicine

## 2017-10-25 ENCOUNTER — Encounter: Payer: Self-pay | Admitting: Internal Medicine

## 2017-10-25 DIAGNOSIS — Z89411 Acquired absence of right great toe: Secondary | ICD-10-CM | POA: Diagnosis not present

## 2017-10-25 DIAGNOSIS — I480 Paroxysmal atrial fibrillation: Secondary | ICD-10-CM | POA: Diagnosis not present

## 2017-10-25 DIAGNOSIS — I10 Essential (primary) hypertension: Secondary | ICD-10-CM | POA: Diagnosis not present

## 2017-10-25 DIAGNOSIS — E7800 Pure hypercholesterolemia, unspecified: Secondary | ICD-10-CM

## 2017-10-25 DIAGNOSIS — I779 Disorder of arteries and arterioles, unspecified: Secondary | ICD-10-CM | POA: Diagnosis not present

## 2017-10-25 NOTE — Progress Notes (Signed)
Location:    [facility] Room Number: 111/W Place of Service:  SNF [phone]) Provider: Einar Crow MD  Mahlon Gammon, MD  Patient Care Team: Mahlon Gammon, MD as PCP - General (Internal Medicine) Wyline Mood [name], MD as PCP - Cardiology (Cardiology) Synetta Shadow as Physician Assistant (Internal Medicine)  Extended Emergency Contact Information Primary Emergency Contact: Casimer Lanius [place] Home Phone: [phone] Mobile Phone: [phone] Relation: None Secondary Emergency Contact: Margarito Courser [place] Home Phone: [phone] Relation: None  Code Status: DNR  Goals of care: Advanced Directive information Advanced Directives 10/25/2017  Does Patient Have a Medical Advance Directive? Yes  Type of Advance Directive Out of facility DNR (pink MOST or yellow form)  Does patient want to make changes to medical advance directive? No - Patient declined  Copy of Healthcare Power of Attorney in Chart? No - copy requested  Would patient like information on creating a medical advance directive? No - Patient declined     Chief Complaint  Patient presents with  . Medical Management of Chronic Issues    Routine visit of medical management, Due Dexa scan    HPI:  Pt is a 84 y.o. female seen today for medical management of chronic diseases.    Patient is Long term resident of facility Patient has Patient has H/O CAD with Cath done in 2008 on Medical management, Paroxysmal Atrial Fibrillation Not on any Anticoagulation has stayed in NSR after DCCV and on Amiodarone, Hypertension, Hyperlipidemia., PAD,Right Great Toe Amputation in 01/18 And left Peroneal Atherectomy  Her weight is stable at 131 Lbs. No New Nursing Issues. Patient now has hearing Aid . She has very supportive family who come and visit her everyday. She did not have any complains.  Past Medical History:  Diagnosis Date  . Acid reflux   . Carotid  artery occlusion    minimal  . Coronary artery disease   . Hypercholesteremia   . Hypertension   . Myocardial infarct (HCC)   . PAF (paroxysmal atrial fibrillation) (HCC)   . Skin disorder   . Systolic murmur   . Thyroid disease    Past Surgical History:  Procedure Laterality Date  . ABDOMINAL HYSTERECTOMY    . AMPUTATION Right 01/05/2016   Procedure: RIGHT GREAT TOE AMPUTATION;  Surgeon: Nada Libman, MD;  Location: [facility] OR;  Service: Vascular;  Laterality: Right;  . CARDIAC CATHETERIZATION    . CARDIOVERSION  2012  . IR GENERIC HISTORICAL  12/30/2015   IR ANGIOGRAM EXTREMITY BILATERAL 12/30/2015 [facility]  . IR GENERIC HISTORICAL  12/30/2015   IR ANGIOGRAM SELECTIVE EACH ADDITIONAL VESSEL 12/30/2015 [facility]  . LOWER EXTREMITY ANGIOGRAM Right 12/30/2015   Procedure: RIGHT LOWER EXTREMITY ANGIOGRAM;  Surgeon: Ancil Linsey, MD;  Location: [facility] ORS;  Service: Vascular;  Laterality: Right;  accessed through left groin area - dressed with two 2x2 and 4x4 opsite  . PERIPHERAL VASCULAR CATHETERIZATION N/A 01/04/2016   Procedure: Lower Extremity Angiography;  Surgeon: Nada Libman, MD;  Location: [facility] INVASIVE CV LAB;  Service: Cardiovascular;  Laterality: N/A;  . PERIPHERAL VASCULAR CATHETERIZATION Right 01/04/2016   Procedure: Peripheral Vascular Atherectomy;  Surgeon: Nada Libman, MD;  Location: [facility] INVASIVE CV LAB;  Service: Cardiovascular;  Laterality: Right;  Peroneal  . PERIPHERAL VASCULAR CATHETERIZATION N/A 02/01/2016   Procedure: Abdominal Aortogram w/Lower Extremity;  Surgeon: Nada Libman, MD;  Location: [facility] INVASIVE CV LAB;  Service: Cardiovascular;  Laterality: N/A;  . PERIPHERAL VASCULAR CATHETERIZATION  02/01/2016   Procedure: Peripheral Vascular Atherectomy;  Surgeon: Nada Libman, MD;  Location: [facility] INVASIVE CV LAB;  Service: Cardiovascular;;  Left   . THYROID SURGERY      No Known Allergies  Outpatient Encounter Medications as of 10/25/2017    Medication Sig  . acetaminophen (TYLENOL) 325 MG tablet Take 650 mg by mouth every 4 (four) hours as needed.   Marland Kitchen amiodarone (PACERONE) 200 MG tablet Take 0.5 tablets (100 mg total) by mouth daily.  Marland Kitchen amLODipine (NORVASC) 5 MG tablet Take 5 mg by mouth daily.  Marland Kitchen aspirin EC 81 MG tablet Take 1 tablet (81 mg total) by mouth daily.  Marland Kitchen atorvastatin (LIPITOR) 20 MG tablet Take 20 mg by mouth every other day. Take 1 tablet (20 mg) by mouth every other night  . docusate sodium (COLACE) 100 MG capsule Take 100 mg by mouth daily.  . furosemide (LASIX) 20 MG tablet Take 20 mg by mouth daily.  Marland Kitchen levothyroxine (SYNTHROID, LEVOTHROID) 100 MCG tablet Take 100 mcg by mouth daily before breakfast.  . polyethylene glycol (MIRALAX / GLYCOLAX) packet Take 17 g by mouth daily.  . ranitidine (ZANTAC) 150 MG tablet Take 150 mg by mouth daily.   Marland Kitchen senna (SENOKOT) 8.6 MG TABS tablet Take 2 tablets by mouth daily.   . [DISCONTINUED] collagenase (SANTYL) ointment Apply 1 application topically daily. Apply to left heel per tx orders   No facility-administered encounter medications on file as of 10/25/2017.      Review of Systems  Review of Systems  Constitutional: Negative for activity change, appetite change, chills, diaphoresis, fatigue and fever.  HENT: Negative for mouth sores, postnasal drip, rhinorrhea, sinus pain and sore throat.   Respiratory: Negative for apnea, cough, chest tightness, shortness of breath and wheezing.   Cardiovascular: Negative for chest pain, palpitations and leg swelling.  Gastrointestinal: Negative for abdominal distention, abdominal pain, constipation, diarrhea, nausea and vomiting.  Genitourinary: Negative for dysuria and frequency.  Musculoskeletal: Negative for arthralgias, joint swelling and myalgias.  Skin: Negative for rash.  Neurological: Negative for dizziness, syncope, weakness, light-headedness and numbness.  Psychiatric/Behavioral: Negative for behavioral problems,  confusion and sleep disturbance.     Immunization History  Administered Date(s) Administered  . Influenza-Unspecified 10/06/2016  . Pneumococcal Conjugate-13 08/24/2016  . Tdap 09/14/2016   Pertinent  Health Maintenance Due  Topic Date Due  . DEXA SCAN  11/25/2017 (Originally 02/09/1985)  . INFLUENZA VACCINE  Completed  . PNA vac Low Risk Adult  Completed   Fall Risk  06/05/2017  Falls in the past year? No   Functional Status Survey:    Vitals:   10/25/17 1144  BP: 136/73  Pulse: 66  Resp: 18  Temp: (!) 96.5 F (35.8 C)  TempSrc: Oral  SpO2: 98%  Weight: 131 lb (59.4 kg)  Height: 5\' 5"  (1.651 m)   Body mass index is 21.8 kg/m. Physical Exam  Constitutional: Oriented to person, place, and time. Well-developed and well-nourished.  HENT:  Head: Normocephalic.  Mouth/Throat: Oropharynx is clear and moist.  Eyes: Pupils are equal, round, and reactive to light.  Neck: Neck supple.  Cardiovascular: Normal rate and normal heart sounds.  No murmur heard. Pulmonary/Chest: Effort normal and breath sounds normal. No respiratory distress. No wheezes. She has no rales.  Abdominal: Soft. Bowel sounds are normal. No distension. There is no tenderness. There is no rebound.  Musculoskeletal: Trace  edema. Especially Right LE > then Left  Lymphadenopathy: none Neurological: Alert and oriented to person, place, and time. Walks with the walker and assist Skin: Skin is warm and dry.  Psychiatric: Normal mood and affect. Behavior is normal. Thought content normal.    Labs reviewed: Recent Labs    06/05/17 0748 07/06/17 0830 08/08/17 0415  NA 142 145 142  K 3.7 3.7 3.6  CL 105 107 108  CO2 29 29 27   GLUCOSE 87 79 83  BUN 25* 30* 33*  CREATININE 1.30* 1.22* 1.29*  CALCIUM 8.9 9.0 8.6*   Recent Labs    11/30/16 0748 03/07/17 1450  AST 22 22  ALT 17 15  ALKPHOS 100 117  BILITOT 0.5 0.7  PROT 7.2 7.2  ALBUMIN 3.7 3.7   Recent Labs    03/07/17 1455 06/05/17 0748  08/08/17 0415  WBC 6.7 4.4 4.6  NEUTROABS 4.7 2.3 2.5  HGB 15.3* 13.8 13.4  HCT 47.4* 42.6 40.3  MCV 97.5 95.1 94.2  PLT 272 247 230   Lab Results  Component Value Date   TSH 1.565 03/20/2017   No results found for: HGBA1C Lab Results  Component Value Date   CHOL 145 09/19/2016   HDL 41 09/19/2016   LDLCALC 81 09/19/2016   TRIG 116 09/19/2016   CHOLHDL 3.5 09/19/2016    Significant Diagnostic Results in last 30 days:  No results found.  Assessment/Plan PAOD (peripheral arterial occlusive disease) Patient has done well and her Ulcers are completely healed Continue on Statin,Lasix and aspirin Essential hypertension BP Stable On Norvasc  Coronary artery disease Follows with Cardiology Stable Aspirin , Statin,  Paroxysmal atrialfibrillation She has been DCCV and on Amiodarone Only on Aspirin Hypothyroidism TSH Normal in03/19 Will repeat in Next Blood Draw  Chronic renal insufficiency, stage 3 (moderate) Creat Stable  Hyperlipidemia On statin  IGERD On zantac    Family/ staff Communication:   Labs/tests ordered:    Total time spent in this patient care encounter was 25_ minutes; greater than 50% of the visit spent counseling patient, reviewing records , Labs and coordinating care for problems addressed at this encounter.

## 2017-10-31 ENCOUNTER — Other Ambulatory Visit: Payer: Self-pay

## 2017-10-31 ENCOUNTER — Non-Acute Institutional Stay (SKILLED_NURSING_FACILITY): Payer: Medicare Other | Admitting: Internal Medicine

## 2017-10-31 ENCOUNTER — Emergency Department (HOSPITAL_COMMUNITY): Payer: Medicare Other

## 2017-10-31 ENCOUNTER — Encounter (HOSPITAL_COMMUNITY): Payer: Self-pay | Admitting: Emergency Medicine

## 2017-10-31 ENCOUNTER — Inpatient Hospital Stay
Admission: RE | Admit: 2017-10-31 | Discharge: 2017-11-04 | Disposition: A | Discharge status: Nursing Home (Includes ICF) | Payer: Medicare Other | Source: Ambulatory Visit | Attending: Internal Medicine | Admitting: Internal Medicine

## 2017-10-31 ENCOUNTER — Emergency Department (HOSPITAL_COMMUNITY)
Admission: EM | Admit: 2017-10-31 | Discharge: 2017-10-31 | Disposition: A | Discharge status: Skilled Nursing Facility | Payer: Medicare Other | Attending: Emergency Medicine | Admitting: Emergency Medicine

## 2017-10-31 ENCOUNTER — Encounter: Payer: Self-pay | Admitting: Internal Medicine

## 2017-10-31 DIAGNOSIS — I483 Typical atrial flutter: Secondary | ICD-10-CM | POA: Diagnosis not present

## 2017-10-31 DIAGNOSIS — Z7982 Long term (current) use of aspirin: Secondary | ICD-10-CM | POA: Insufficient documentation

## 2017-10-31 DIAGNOSIS — I480 Paroxysmal atrial fibrillation: Secondary | ICD-10-CM | POA: Diagnosis not present

## 2017-10-31 DIAGNOSIS — E039 Hypothyroidism, unspecified: Secondary | ICD-10-CM | POA: Diagnosis not present

## 2017-10-31 DIAGNOSIS — R42 Dizziness and giddiness: Secondary | ICD-10-CM | POA: Diagnosis not present

## 2017-10-31 DIAGNOSIS — I259 Chronic ischemic heart disease, unspecified: Secondary | ICD-10-CM | POA: Diagnosis not present

## 2017-10-31 DIAGNOSIS — R009 Unspecified abnormalities of heart beat: Secondary | ICD-10-CM

## 2017-10-31 DIAGNOSIS — I129 Hypertensive chronic kidney disease with stage 1 through stage 4 chronic kidney disease, or unspecified chronic kidney disease: Secondary | ICD-10-CM | POA: Insufficient documentation

## 2017-10-31 DIAGNOSIS — Z79899 Other long term (current) drug therapy: Secondary | ICD-10-CM | POA: Diagnosis not present

## 2017-10-31 DIAGNOSIS — N183 Chronic kidney disease, stage 3 (moderate): Secondary | ICD-10-CM | POA: Diagnosis not present

## 2017-10-31 DIAGNOSIS — I10 Essential (primary) hypertension: Secondary | ICD-10-CM | POA: Diagnosis not present

## 2017-10-31 LAB — COMPREHENSIVE METABOLIC PANEL
ALK PHOS: 116 U/L (ref 38–126)
ALT: 19 U/L (ref 0–44)
ANION GAP: 12 (ref 5–15)
AST: 23 U/L (ref 15–41)
Albumin: 4.1 g/dL (ref 3.5–5.0)
BILIRUBIN TOTAL: 0.6 mg/dL (ref 0.3–1.2)
BUN: 30 mg/dL — ABNORMAL HIGH (ref 8–23)
CALCIUM: 9.2 mg/dL (ref 8.9–10.3)
CO2: 26 mmol/L (ref 22–32)
Chloride: 103 mmol/L (ref 98–111)
Creatinine, Ser: 1.22 mg/dL — ABNORMAL HIGH (ref 0.44–1.00)
GFR calc non Af Amer: 36 mL/min — ABNORMAL LOW (ref >60)
GFR, EST AFRICAN AMERICAN: 42 mL/min — AB (ref >60)
Glucose, Bld: 114 mg/dL — ABNORMAL HIGH (ref 70–99)
Potassium: 3.7 mmol/L (ref 3.5–5.1)
SODIUM: 141 mmol/L (ref 135–145)
TOTAL PROTEIN: 7.9 g/dL (ref 6.5–8.1)

## 2017-10-31 LAB — CBC WITH DIFFERENTIAL/PLATELET
Abs Immature Granulocytes: 0.03 10*3/uL (ref 0.00–0.07)
BASOS PCT: 1 %
Basophils Absolute: 0 10*3/uL (ref 0.0–0.1)
EOS ABS: 0.1 10*3/uL (ref 0.0–0.5)
EOS PCT: 1 %
HCT: 50.4 % — ABNORMAL HIGH (ref 36.0–46.0)
Hemoglobin: 16 g/dL — ABNORMAL HIGH (ref 12.0–15.0)
Immature Granulocytes: 1 %
Lymphocytes Relative: 23 %
Lymphs Abs: 1.2 10*3/uL (ref 0.7–4.0)
MCH: 30.1 pg (ref 26.0–34.0)
MCHC: 31.7 g/dL (ref 30.0–36.0)
MCV: 94.7 fL (ref 80.0–100.0)
MONO ABS: 0.4 10*3/uL (ref 0.1–1.0)
Monocytes Relative: 8 %
Neutro Abs: 3.4 10*3/uL (ref 1.7–7.7)
Neutrophils Relative %: 66 %
Platelets: 331 10*3/uL (ref 150–400)
RBC: 5.32 MIL/uL — AB (ref 3.87–5.11)
RDW: 12.9 % (ref 11.5–15.5)
WBC: 5.1 10*3/uL (ref 4.0–10.5)
nRBC: 0 % (ref 0.0–0.2)

## 2017-10-31 LAB — URINALYSIS, ROUTINE W REFLEX MICROSCOPIC
BILIRUBIN URINE: NEGATIVE
Bacteria, UA: NONE SEEN
Glucose, UA: NEGATIVE mg/dL
HGB URINE DIPSTICK: NEGATIVE
KETONES UR: NEGATIVE mg/dL
NITRITE: NEGATIVE
PROTEIN: NEGATIVE mg/dL
Specific Gravity, Urine: 1.014 (ref 1.005–1.030)
pH: 7 (ref 5.0–8.0)

## 2017-10-31 LAB — TROPONIN I

## 2017-10-31 LAB — MAGNESIUM: Magnesium: 2.4 mg/dL (ref 1.7–2.4)

## 2017-10-31 LAB — TSH: TSH: 0.969 u[IU]/mL (ref 0.350–4.500)

## 2017-10-31 MED ORDER — DILTIAZEM HCL 100 MG IV SOLR
5.0000 mg/h | INTRAVENOUS | Status: DC
  Administered 2017-10-31: 5 mg/h via INTRAVENOUS
  Filled 2017-10-31: qty 100

## 2017-10-31 MED ORDER — DILTIAZEM LOAD VIA INFUSION
10.0000 mg | Freq: Once | INTRAVENOUS | Status: AC
  Administered 2017-10-31: 10 mg via INTRAVENOUS
  Filled 2017-10-31: qty 10

## 2017-10-31 MED ORDER — METOPROLOL SUCCINATE ER 25 MG PO TB24: 12.5000 mg | ORAL_TABLET | Freq: Every day | ORAL | 1 refills | Status: AC

## 2017-10-31 MED ORDER — METOPROLOL TARTRATE 50 MG PO TABS
50.0000 mg | ORAL_TABLET | Freq: Once | ORAL | Status: AC
  Administered 2017-10-31: 50 mg via ORAL
  Filled 2017-10-31: qty 1

## 2017-10-31 NOTE — Progress Notes (Signed)
Location:    [facility] Room Number: 111/W Place of Service:  SNF [phone]) Provider:  Sabino Dick, MD  Patient Care Team: Mahlon Gammon, MD as PCP - General (Internal Medicine) Wyline Mood [name], MD as PCP - Cardiology (Cardiology) Synetta Shadow as Physician Assistant (Internal Medicine)  Extended Emergency Contact Information Primary Emergency Contact: Casimer Lanius [place] Home Phone: [phone] Mobile Phone: [phone] Relation: None Secondary Emergency Contact: Margarito Courser [place] Home Phone: [phone] Relation: None  Code Status:  DNR Goals of care: Advanced Directive information Advanced Directives 10/31/2017  Does Patient Have a Medical Advance Directive? Yes  Type of Advance Directive Out of facility DNR (pink MOST or yellow form)  Does patient want to make changes to medical advance directive? No - Patient declined  Copy of Healthcare Power of Attorney in Chart? No - copy requested  Would patient like information on creating a medical advance directive? No - Patient declined     Chief Complaint  Patient presents with  . Acute Visit    Dizzness and Headache  As well as tachycardia  HPI:  Pt is a 84 y.o. female seen today for an acute visit for complaints of acute onset of dizziness as well as possible headache and what appears to be tachycardia with history of atrial fibrillation.  [name] is a long-term resident of facility with a history of coronary artery disease with cath done in 2008-this is medically managed she also has a history of atrial fibrillation not on anticoagulation- other than aspirin-she has largely stayed in normal sinus rhythm after DCCV and continues on amiodarone-she also has a history of hypertension in addition hyperlipidemia peripheral arterial disease with great toe amputation the right as well as a left peroneal atherectomy.  Apparently she was in  her normal state of health earlier today but early this afternoon did complain of dizziness-- per nursing occasionally she will complain of dizziness but this is of short duration but the way this was described this was a severe case of dizziness- she said her head was significantly swimming and possibly had a headache although this was difficult to fully ascertain.  Vital signs showed an elevated blood pressure of 178/100-pulse was regular irregular at slightly over 100.  Blood sugar was stable at 110 oxygen saturation was in the 90s on room air she was afebrile.   Patient stated she has never had this feeling before-  For hypertension she is on Norvasc 5 mg a day as well as Lasix 20 mg a day.  She also is on Synthroid with a history of hypothyroidism TSH back in March was within normal limits   On reevaluation a few minutes later her pulse actually had jumped in to around 110 and on subsequent exam was up in the 120s --there was approximately over a 10 minute span She said her head was still swimming it would get a little bit better and then returned to an uncomfortable level.  She denies chest pain or shortness of breath or acute visual changes        Past Medical History:  Diagnosis Date  . Acid reflux   . Carotid artery occlusion    minimal  . Coronary artery disease   . Hypercholesteremia   . Hypertension   . Myocardial infarct (HCC)   . PAF (paroxysmal atrial fibrillation) (HCC)   . Skin disorder   . Systolic murmur   .  Thyroid disease    Past Surgical History:  Procedure Laterality Date  . ABDOMINAL HYSTERECTOMY    . AMPUTATION Right 01/05/2016   Procedure: RIGHT GREAT TOE AMPUTATION;  Surgeon: Nada Libman, MD;  Location: [facility] OR;  Service: Vascular;  Laterality: Right;  . CARDIAC CATHETERIZATION    . CARDIOVERSION  2012  . IR GENERIC HISTORICAL  12/30/2015   IR ANGIOGRAM EXTREMITY BILATERAL 12/30/2015 [facility]  . IR GENERIC HISTORICAL  12/30/2015   IR  ANGIOGRAM SELECTIVE EACH ADDITIONAL VESSEL 12/30/2015 [facility]  . LOWER EXTREMITY ANGIOGRAM Right 12/30/2015   Procedure: RIGHT LOWER EXTREMITY ANGIOGRAM;  Surgeon: Ancil Linsey, MD;  Location: [facility];  Service: Vascular;  Laterality: Right;  accessed through left groin area - dressed with two 2x2 and 4x4 opsite  . PERIPHERAL VASCULAR CATHETERIZATION N/A 01/04/2016   Procedure: Lower Extremity Angiography;  Surgeon: Nada Libman, MD;  Location: [facility] INVASIVE CV LAB;  Service: Cardiovascular;  Laterality: N/A;  . PERIPHERAL VASCULAR CATHETERIZATION Right 01/04/2016   Procedure: Peripheral Vascular Atherectomy;  Surgeon: Nada Libman, MD;  Location: [facility] INVASIVE CV LAB;  Service: Cardiovascular;  Laterality: Right;  Peroneal  . PERIPHERAL VASCULAR CATHETERIZATION N/A 02/01/2016   Procedure: Abdominal Aortogram w/Lower Extremity;  Surgeon: Nada Libman, MD;  Location: [facility] INVASIVE CV LAB;  Service: Cardiovascular;  Laterality: N/A;  . PERIPHERAL VASCULAR CATHETERIZATION  02/01/2016   Procedure: Peripheral Vascular Atherectomy;  Surgeon: Nada Libman, MD;  Location: [facility] INVASIVE CV LAB;  Service: Cardiovascular;;  Left   . THYROID SURGERY      No Known Allergies  Outpatient Encounter Medications as of 10/31/2017  Medication Sig  . acetaminophen (TYLENOL) 325 MG tablet Take 650 mg by mouth every 4 (four) hours as needed.   Marland Kitchen amiodarone (PACERONE) 200 MG tablet Take 0.5 tablets (100 mg total) by mouth daily.  Marland Kitchen amLODipine (NORVASC) 5 MG tablet Take 5 mg by mouth daily.  Marland Kitchen aspirin EC 81 MG tablet Take 1 tablet (81 mg total) by mouth daily.  Marland Kitchen atorvastatin (LIPITOR) 20 MG tablet Take 20 mg by mouth every other day. Take 1 tablet (20 mg) by mouth every other night  . docusate sodium (COLACE) 100 MG capsule Take 100 mg by mouth daily.  . furosemide (LASIX) 20 MG tablet Take 20 mg by mouth daily.  Marland Kitchen levothyroxine (SYNTHROID, LEVOTHROID) 100 MCG tablet Take 100 mcg by mouth daily before  breakfast.  . polyethylene glycol (MIRALAX / GLYCOLAX) packet Take 17 g by mouth daily.  . ranitidine (ZANTAC) 150 MG tablet Take 150 mg by mouth daily.   Marland Kitchen senna (SENOKOT) 8.6 MG TABS tablet Take 2 tablets by mouth daily.    No facility-administered encounter medications on file as of 10/31/2017.     Review of Systems   General she does not complain of fever chills.  Skin is not diaphoretic.  Head ears eyes nose mouth and throat is not complain of visual changes is complaining of significant dizziness and possible headache again this is difficult to fully ascertain.  Respiratory denies shortness of breath or cough.  Cardiac denies chest pain.  GI does not complain of abdominal discomfort there has been no nausea or vomiting.  GU is not complaining of dysuria.  Musculoskeletal is not complaining of joint pain at this time.  Neurologic does complain of overwhelming dizziness- "swimmy headed" as well as vague description of a headache.  Does not complain of numbness or syncope  Psych she appears to be  anxious  Immunization History  Administered Date(s) Administered  . Influenza-Unspecified 10/06/2016  . Pneumococcal Conjugate-13 08/24/2016  . Tdap 09/14/2016   Pertinent  Health Maintenance Due  Topic Date Due  . DEXA SCAN  11/25/2017 (Originally [birth year]/1987)  . INFLUENZA VACCINE  Completed  . PNA vac Low Risk Adult  Completed   Fall Risk  06/05/2017  Falls in the past year? No   Functional Status Survey:     She is afebrile pulse varies from 100- 120's  on most recent exam- blood pressure 178/100- update blood pressure was somewhat difficult to hear but sounded around 150/90- respirations were 18-CBG was 110 oxygen saturation was 98 % on room air Physical Exam   In general this is a somewhat frail elderly female in no acute distress but she is quite anxious and appears moderately distressed about her dizziness and possible headache.  Her skin is warm and dry she is not  diaphoretic.  Eyes pupils appear to be reactive light sclerae conjunctive are clear visual acuity appears grossly intact extraocular movements intact.  Oropharynx is clear mucous membranes moist tongue is midline with what appears to be full range of motion.  Heart-is tachycardic with regular irregular rhythm with skipped beats--heart rate appears to varies from around 105 up to the 120s   Abdomen is soft with somewhat hypoactive bowel sounds does not appear to be overtly tender.  Musculoskeletal appears able to move all extremities x4 she does have bilateral grip strength somewhat weak but this could be because of her dizziness and not following verbal commands thoroughly  Neurologic--= could not really appreciate lateralizing findings- again she does not follow verbal commands l well because she says she is  Severely  dizzy- could not appreciate true lateralizing findings-her speech was clear  Psych-she is alert somewhat anxious mental status appears to be at baseline except for increased anxiety--she does answer appropriately- When asked simple straightforward questions  Labs reviewed: Recent Labs    06/05/17 0748 07/06/17 0830 08/08/17 0415  NA 142 145 142  K 3.7 3.7 3.6  CL 105 107 108  CO2 29 29 27   GLUCOSE 87 79 83  BUN 25* 30* 33*  CREATININE 1.30* 1.22* 1.29*  CALCIUM 8.9 9.0 8.6*   Recent Labs    11/30/16 0748 03/07/17 1450  AST 22 22  ALT 17 15  ALKPHOS 100 117  BILITOT 0.5 0.7  PROT 7.2 7.2  ALBUMIN 3.7 3.7   Recent Labs    03/07/17 1455 06/05/17 0748 08/08/17 0415  WBC 6.7 4.4 4.6  NEUTROABS 4.7 2.3 2.5  HGB 15.3* 13.8 13.4  HCT 47.4* 42.6 40.3  MCV 97.5 95.1 94.2  PLT 272 247 230   Lab Results  Component Value Date   TSH 1.565 03/20/2017   No results found for: HGBA1C Lab Results  Component Value Date   CHOL 145 09/19/2016   HDL 41 09/19/2016   LDLCALC 81 09/19/2016   TRIG 116 09/19/2016   CHOLHDL 3.5 09/19/2016    Significant  Diagnostic Results in last 30 days:  No results found.  Assessment/Plan EMS was nice enough what  #1- history of acute dizziness- with tachycardia- question headache- elevated blood pressure- will send her to the ER   could be numerous etiologies- Per nursing her dizziness has not been this dramatic in the past- also concerned with her tachycardia-with possible complications from atrial fibrillation- she continues to deny shortness of breath or chest pain- will await ER evaluation.  [id number]

## 2017-10-31 NOTE — ED Triage Notes (Signed)
Pt from [facility] c/o sudden onset of dizziness and generalized weakness that began at 1500. No neuro deficits noted at this time.

## 2017-10-31 NOTE — Discharge Instructions (Signed)
Please have your family doctor recheck you within 48 hours, you have had an abnormal heart rhythm tonight called atrial flutter but this is something that you have been diagnosed within the past as well.  It is to be treated with a medication called amiodarone which you are already taking, metoprolol, half of a tablet which I have prescribed for you to be taken once a day and continued use of your baby aspirin.  If you should develop severe or worsening weakness, fluttering in your chest, chest pain or difficulty breathing please return to the emergency department immediately.  Please see your cardiologist within the week, have them obtain your results and discuss the treatment plan.

## 2017-10-31 NOTE — ED Notes (Signed)
Cardizem stopped per dr Rondel Baton orders

## 2017-10-31 NOTE — ED Notes (Signed)
EDP at bedside  

## 2017-10-31 NOTE — ED Provider Notes (Signed)
[facility] EMERGENCY DEPARTMENT Provider Note   CSN: [id number] Arrival date & time: 10/31/17  1517     History   Chief Complaint Chief Complaint  Patient presents with  . Dizziness    HPI [name] is a 84 y.o. female.  HPI  The patient is a 84 year old female, she comes from a nursing facility where she was found to have acute onset of dizziness today.  She describes the dizziness as a lightheadedness, this came on when she was awake alert and in the upright position.  She denies any associated chest pain, shortness of breath, blurred vision, numbness, focal weakness, difficulty with coordination or speech.  She has not had any fevers chills nausea vomiting or diarrhea and has been eating and drinking as per her normal.  She does have a history of paroxysmal atrial fibrillation and is on amiodarone but of note her recent EKGs per my review of the medical record show that she has been in normal sinus rhythm for some time.  She presents today tachycardic but denies palpitations.  Symptoms are persistent, nothing seems to make this better or worse, it is not positional.  She was not noted to have any focal neurologic deficits by prehospital staff Past Medical History:  Diagnosis Date  . Acid reflux   . Carotid artery occlusion    minimal  . Coronary artery disease   . Hypercholesteremia   . Hypertension   . Myocardial infarct (HCC)   . PAF (paroxysmal atrial fibrillation) (HCC)   . Skin disorder   . Systolic murmur   . Thyroid disease     Patient Active Problem List   Diagnosis Date Noted  . Paroxysmal atrial fibrillation (HCC) 04/06/2016  . Bilateral leg edema 04/06/2016  . Chronic renal insufficiency, stage 3 (moderate) (HCC) 04/06/2016  . Hypothyroidism 04/06/2016  . History of angioplasty of peripheral vessel 03/28/2016  . Status post amputation of great toe, right (HCC) 03/28/2016  . PAOD (peripheral arterial occlusive disease) (HCC) 02/01/2016  . CAD  (coronary artery disease) 03/26/2013  . HTN (hypertension) 03/26/2013  . Hyperlipidemia 03/26/2013  . Heart murmur 03/26/2013    Past Surgical History:  Procedure Laterality Date  . ABDOMINAL HYSTERECTOMY    . AMPUTATION Right 01/05/2016   Procedure: RIGHT GREAT TOE AMPUTATION;  Surgeon: Nada Libman, MD;  [facility] OR;  Service: Vascular;  Laterality: Right;  . CARDIAC CATHETERIZATION    . CARDIOVERSION  2012  . IR GENERIC HISTORICAL  12/30/2015   IR ANGIOGRAM EXTREMITY BILATERAL 12/30/2015 [facility]  . IR GENERIC HISTORICAL  12/30/2015   IR ANGIOGRAM SELECTIVE EACH ADDITIONAL VESSEL 12/30/2015 [facility]  . LOWER EXTREMITY ANGIOGRAM Right 12/30/2015   Procedure: RIGHT LOWER EXTREMITY ANGIOGRAM;  Surgeon: Ancil Linsey, MD;  Location: [facility] ORS;  Service: Vascular;  Laterality: Right;  accessed through left groin area - dressed with two 2x2 and 4x4 opsite  . PERIPHERAL VASCULAR CATHETERIZATION N/A 01/04/2016   Procedure: Lower Extremity Angiography;  Surgeon: Nada Libman, MD;  Location: [facility] INVASIVE CV LAB;  Service: Cardiovascular;  Laterality: N/A;  . PERIPHERAL VASCULAR CATHETERIZATION Right 01/04/2016   Procedure: Peripheral Vascular Atherectomy;  Surgeon: Nada Libman, MD;  Location: [facility] INVASIVE CV LAB;  Service: Cardiovascular;  Laterality: Right;  Peroneal  . PERIPHERAL VASCULAR CATHETERIZATION N/A 02/01/2016   Procedure: Abdominal Aortogram w/Lower Extremity;  Surgeon: Nada Libman, MD;  Location: [facility] INVASIVE CV LAB;  Service: Cardiovascular;  Laterality: N/A;  . PERIPHERAL VASCULAR CATHETERIZATION  02/01/2016   Procedure: Peripheral Vascular Atherectomy;  Surgeon: Nada Libman, MD;  Location: [facility] INVASIVE CV LAB;  Service: Cardiovascular;;  Left   . THYROID SURGERY       OB History   None      Home Medications    Prior to Admission medications   Medication Sig Start Date End Date Taking? Authorizing Provider  acetaminophen (TYLENOL) 325 MG  tablet Take 650 mg by mouth every 4 (four) hours as needed.    Yes [provider]  amiodarone (PACERONE) 200 MG tablet Take 0.5 tablets (100 mg total) by mouth daily. 08/08/12  Yes Susa Griffins, MD  amLODipine (NORVASC) 5 MG tablet Take 5 mg by mouth daily.   Yes [provider]  aspirin EC 81 MG tablet Take 1 tablet (81 mg total) by mouth daily. 03/26/13  Yes BranchDorothe Pea, MD  atorvastatin (LIPITOR) 20 MG tablet Take 20 mg by mouth every other day. Take 1 tablet (20 mg) by mouth every other night   Yes [provider]  docusate sodium (COLACE) 100 MG capsule Take 100 mg by mouth daily.   Yes [provider]  furosemide (LASIX) 20 MG tablet Take 20 mg by mouth daily.   Yes [provider]  levothyroxine (SYNTHROID, LEVOTHROID) 100 MCG tablet Take 100 mcg by mouth daily before breakfast.   Yes [provider]  polyethylene glycol (MIRALAX / GLYCOLAX) packet Take 17 g by mouth daily.   Yes [provider]  ranitidine (ZANTAC) 150 MG tablet Take 150 mg by mouth daily.    Yes [provider]  senna (SENOKOT) 8.6 MG TABS tablet Take 2 tablets by mouth daily.    Yes [provider]  metoprolol succinate (TOPROL-XL) 25 MG 24 hr tablet Take 0.5 tablets (12.5 mg total) by mouth daily. 10/31/17   Eber Hong, MD    Family History Family History  Family history unknown: Yes    Social History Social History   Tobacco Use  . Smoking status: Never Smoker  . Smokeless tobacco: Never Used  Substance Use Topics  . Alcohol use: No    Alcohol/week: 0.0 standard drinks  . Drug use: No     Allergies   Patient has no known allergies.   Review of Systems Review of Systems  All other systems reviewed and are negative.    Physical Exam Updated Vital Signs BP (!) 159/113   Pulse 71   Temp (!) 97.5 F (36.4 C) (Oral)   Resp 20   Wt 60 kg   SpO2 100%   BMI 22.01 kg/m   Physical Exam    Constitutional: She appears well-developed and well-nourished. No distress.  HENT:  Head: Normocephalic and atraumatic.  Mouth/Throat: Oropharynx is clear and moist. No oropharyngeal exudate.  Eyes: Pupils are equal, round, and reactive to light. Conjunctivae and EOM are normal. Right eye exhibits no discharge. Left eye exhibits no discharge. No scleral icterus.  Neck: Normal range of motion. Neck supple. No JVD present. No thyromegaly present.  Cardiovascular: Normal heart sounds and intact distal pulses. Exam reveals no gallop and no friction rub.  No murmur heard. Irregularly irregular rhythm, normal pulses at the radial arteries, no JVD, no edema  Pulmonary/Chest: Effort normal and breath sounds normal. No respiratory distress. She has no wheezes. She has no rales.  Abdominal: Soft. Bowel sounds are normal. She exhibits no distension and no mass. There is no tenderness.  Musculoskeletal: Normal range of motion. She exhibits  no edema or tenderness.  Lymphadenopathy:    She has no cervical adenopathy.  Neurological: She is alert. Coordination normal.  The patient is able to follow commands, she is able to sit up in bed without any assistance very briskly, she has has normal strength in all 4 extremities, normal coordination, speech, memory.  Skin: Skin is warm and dry. No rash noted. No erythema.  Psychiatric: She has a normal mood and affect. Her behavior is normal.  Nursing note and vitals reviewed.    ED Treatments / Results  Labs (all labs ordered are listed, but only abnormal results are displayed) Labs Reviewed  URINALYSIS, ROUTINE W REFLEX MICROSCOPIC - Abnormal; Notable for the following components:      Result Value   Leukocytes, UA MODERATE (*)    All other components within normal limits  CBC WITH DIFFERENTIAL/PLATELET - Abnormal; Notable for the following components:   RBC 5.32 (*)    Hemoglobin 16.0 (*)    HCT 50.4 (*)    All other components within normal limits   COMPREHENSIVE METABOLIC PANEL - Abnormal; Notable for the following components:   Glucose, Bld 114 (*)    BUN 30 (*)    Creatinine, Ser 1.22 (*)    GFR calc non Af Amer 36 (*)    GFR calc Af Amer 42 (*)    All other components within normal limits  URINE CULTURE  MAGNESIUM  TROPONIN I  TSH    EKG EKG Interpretation  Date/Time:  Wednesday October 31 2017 18:07:29 EDT Ventricular Rate:  59 PR Interval:    QRS Duration: 98 QT Interval:  466 QTC Calculation: 462 R Axis:   102 Text Interpretation:  Atrial flutter with predominant 4:1 AV block Since last tracing rate slowed, still in flutter Confirmed by Eber Hong ([id number]) on 10/31/2017 6:16:22 PM   Radiology Dg Chest Port 1 View  Result Date: 10/31/2017 CLINICAL DATA:  Sudden onset dizziness and weakness starting at 1500 hours. EXAM: PORTABLE CHEST 1 VIEW COMPARISON:  12/27/2015 FINDINGS: Stable cardiomegaly with moderate aortic atherosclerosis. Slight hyperinflation lungs without pulmonary consolidation, effusion or pneumothorax. Slight pulmonary vascular congestion is redemonstrated. Surgical clips project over the base of neck and superior mediastinum as before. Osteoarthritis of the included AC and glenohumeral joints slight high-riding of the right humeral head. IMPRESSION: Stable cardiomegaly with aortic atherosclerosis. Mild pulmonary vascular congestion. Electronically Signed   By: Tollie Eth M.D.   On: 10/31/2017 16:30    Procedures Procedures (including critical care time)  Medications Ordered in ED Medications  diltiazem (CARDIZEM) 1 mg/mL load via infusion 10 mg (10 mg Intravenous Bolus from Bag 10/31/17 1732)    And  diltiazem (CARDIZEM) 100 mg in dextrose 5 % 100 mL (1 mg/mL) infusion (0 mg/hr Intravenous Stopped 10/31/17 1814)  metoprolol tartrate (LOPRESSOR) tablet 50 mg (50 mg Oral Given 10/31/17 1649)     Initial Impression / Assessment and Plan / ED Course  I have reviewed the triage vital signs and  the nursing notes.  Pertinent labs & imaging results that were available during my care of the patient were reviewed by me and considered in my medical decision making (see chart for details).  Clinical Course as of Nov 01 1818  Wed Oct 31, 2017  1643 CBC is unremarkable   [BM]  1706 The chest xray has no effusion, no pna, no pneumothorax - has pulmonary vascular congestion, I have personally seen and evaluated and interpreted the xray.   [BM]  Clinical Course User Index [BM] Eber Hong, MD   The patient's EKG is considered with atrial flutter with a variable block.  This is new compared to prior EKGs however she does have a history of atrial arrhythmias and is on amiodarone.  Will check labs, chest x-ray, I do not see any other signs of focal neurologic deficits suggestive of stroke or other more severe illness.  Labs pending  The patient is now rate controlled adequately, she has been given oral beta-blocker as well as a dose of intravenous calcium channel blocker and currently has a rate of approximately 60 bpm in atrial flutter 4-1 block.  She has Artie been seen by cardiology in the past, but the medical record documents that because of her comorbidities and frail status that she should not be on more intense anticoagulation but remains on a baby aspirin.  This despite her history of atrial fibrillation paroxysmally.  I have discussed the results with the family and the patient, she is stable for discharge, added a very small dose of metoprolol XL, 12.5 mg to be taken daily until she follows up with a cardiologist which I recommended this week.  Other labs are unremarkable  Final Clinical Impressions(s) / ED Diagnoses   Final diagnoses:  Typical atrial flutter [facility])    ED Discharge Orders         Ordered    metoprolol succinate (TOPROL-XL) 25 MG 24 hr tablet  Daily     10/31/17 1818           Eber Hong, MD 10/31/17 1820

## 2017-11-01 ENCOUNTER — Non-Acute Institutional Stay (SKILLED_NURSING_FACILITY): Payer: Medicare Other | Admitting: Internal Medicine

## 2017-11-01 ENCOUNTER — Encounter: Payer: Self-pay | Admitting: Internal Medicine

## 2017-11-01 DIAGNOSIS — I480 Paroxysmal atrial fibrillation: Secondary | ICD-10-CM

## 2017-11-01 DIAGNOSIS — I4892 Unspecified atrial flutter: Secondary | ICD-10-CM | POA: Diagnosis not present

## 2017-11-01 NOTE — Progress Notes (Signed)
Location:    [facility] Room Number: 111/W Place of Service:  SNF [phone]) Provider: Einar Crow MD  Mahlon Gammon, MD  Patient Care Team: Mahlon Gammon, MD as PCP - General (Internal Medicine) Wyline Mood [name], MD as PCP - Cardiology (Cardiology) Synetta Shadow as Physician Assistant (Internal Medicine)  Extended Emergency Contact Information Primary Emergency Contact: Casimer Lanius [place] Home Phone: [phone] Mobile Phone: [phone] Relation: None Secondary Emergency Contact: Margarito Courser [place] Home Phone: [phone] Relation: None  Code Status:  DNR Goals of care: Advanced Directive information Advanced Directives 11/01/2017  Does Patient Have a Medical Advance Directive? Yes  Type of Advance Directive Out of facility DNR (pink MOST or yellow form)  Does patient want to make changes to medical advance directive? No - Patient declined  Copy of Healthcare Power of Attorney in Chart? No - copy requested  Would patient like information on creating a medical advance directive? No - Patient declined     Chief Complaint  Patient presents with  . Acute Visit    F/U ED Visit    HPI:  Pt is a 84 y.o. female seen today for an acute visit for Follow up from ED. Patient is a long-term resident of facility   Patient has H/O CAD with Cath done in 2008 on Medical management, Paroxysmal Atrial Fibrillation Not on any Anticoagulation has stayed in NSR after DCCV and on Amiodarone, Hypertension, Hyperlipidemia., PAD,Right Great Toe Amputation in 01/18 And left Peroneal Atherectomy  Patient was sent to the emergency room yesterday because she had a sudden onset of acute dizziness.  She did not have any chest pain or shortness of breath.  She had a EKG done in the ED which showed that she was in atrial flutter. she was started on metoprolol 12.5 mg.  And discharged back to the facility. Patient is back to  her baseline today she denies any dizziness, chest pain, shortness of breath.  She was little nervous and wanted to know when she can see her cardiologist.   Past Medical History:  Diagnosis Date  . Acid reflux   . Carotid artery occlusion    minimal  . Coronary artery disease   . Hypercholesteremia   . Hypertension   . Myocardial infarct (HCC)   . PAF (paroxysmal atrial fibrillation) (HCC)   . Skin disorder   . Systolic murmur   . Thyroid disease    Past Surgical History:  Procedure Laterality Date  . ABDOMINAL HYSTERECTOMY    . AMPUTATION Right 01/05/2016   Procedure: RIGHT GREAT TOE AMPUTATION;  Surgeon: Nada Libman, MD;  Location: [facility] OR;  Service: Vascular;  Laterality: Right;  . CARDIAC CATHETERIZATION    . CARDIOVERSION  2012  . IR GENERIC HISTORICAL  12/30/2015   IR ANGIOGRAM EXTREMITY BILATERAL 12/30/2015 [facility]  . IR GENERIC HISTORICAL  12/30/2015   IR ANGIOGRAM SELECTIVE EACH ADDITIONAL VESSEL 12/30/2015 [facility]  . LOWER EXTREMITY ANGIOGRAM Right 12/30/2015   Procedure: RIGHT LOWER EXTREMITY ANGIOGRAM;  Surgeon: Ancil Linsey, MD;  Location: [facility] ORS;  Service: Vascular;  Laterality: Right;  accessed through left groin area - dressed with two 2x2 and 4x4 opsite  . PERIPHERAL VASCULAR CATHETERIZATION N/A 01/04/2016   Procedure: Lower Extremity Angiography;  Surgeon: Nada Libman, MD;  Location: [facility] INVASIVE CV LAB;  Service: Cardiovascular;  Laterality: N/A;  . PERIPHERAL VASCULAR CATHETERIZATION Right 01/04/2016   Procedure: Peripheral Vascular Atherectomy;  Surgeon: Nada Libman, MD;  Location: [facility] INVASIVE CV LAB;  Service: Cardiovascular;  Laterality: Right;  Peroneal  . PERIPHERAL VASCULAR CATHETERIZATION N/A 02/01/2016   Procedure: Abdominal Aortogram w/Lower Extremity;  Surgeon: Nada Libman, MD;  Location: [facility] INVASIVE CV LAB;  Service: Cardiovascular;  Laterality: N/A;  . PERIPHERAL VASCULAR CATHETERIZATION  02/01/2016   Procedure:  Peripheral Vascular Atherectomy;  Surgeon: Nada Libman, MD;  Location: [facility] INVASIVE CV LAB;  Service: Cardiovascular;;  Left   . THYROID SURGERY      No Known Allergies  Outpatient Encounter Medications as of 11/01/2017  Medication Sig  . acetaminophen (TYLENOL) 325 MG tablet Take 650 mg by mouth every 4 (four) hours as needed.   Marland Kitchen amiodarone (PACERONE) 200 MG tablet Take 0.5 tablets (100 mg total) by mouth daily.  Marland Kitchen amLODipine (NORVASC) 5 MG tablet Take 5 mg by mouth daily.  Marland Kitchen aspirin EC 81 MG tablet Take 1 tablet (81 mg total) by mouth daily.  Marland Kitchen atorvastatin (LIPITOR) 20 MG tablet Take 20 mg by mouth every other day. Take 1 tablet (20 mg) by mouth every other night  . docusate sodium (COLACE) 100 MG capsule Take 100 mg by mouth daily.  . furosemide (LASIX) 20 MG tablet Take 20 mg by mouth daily.  Marland Kitchen levothyroxine (SYNTHROID, LEVOTHROID) 100 MCG tablet Take 100 mcg by mouth daily before breakfast.  . metoprolol succinate (TOPROL-XL) 25 MG 24 hr tablet Take 0.5 tablets (12.5 mg total) by mouth daily.  . polyethylene glycol (MIRALAX / GLYCOLAX) packet Take 17 g by mouth daily.  . ranitidine (ZANTAC) 150 MG tablet Take 150 mg by mouth daily.   Marland Kitchen senna (SENOKOT) 8.6 MG TABS tablet Take 2 tablets by mouth daily.    No facility-administered encounter medications on file as of 11/01/2017.      Review of Systems  Review of Systems  Constitutional: Negative for activity change, appetite change, chills, diaphoresis, fatigue and fever.  HENT: Negative for mouth sores, postnasal drip, rhinorrhea, sinus pain and sore throat.   Respiratory: Negative for apnea, cough, chest tightness, shortness of breath and wheezing.   Cardiovascular: Negative for chest pain, palpitations and leg swelling.  Gastrointestinal: Negative for abdominal distention, abdominal pain, constipation, diarrhea, nausea and vomiting.  Genitourinary: Negative for dysuria and frequency.  Musculoskeletal: Negative for  arthralgias, joint swelling and myalgias.  Skin: Negative for rash.  Neurological: Negative for dizziness, syncope, weakness, light-headedness and numbness.  Psychiatric/Behavioral: Negative for behavioral problems, confusion and sleep disturbance.     Immunization History  Administered Date(s) Administered  . Influenza-Unspecified 10/06/2016  . Pneumococcal Conjugate-13 08/24/2016  . Tdap 09/14/2016   Pertinent  Health Maintenance Due  Topic Date Due  . DEXA SCAN  11/25/2017 (Originally 02/[birth year]/1987)  . INFLUENZA VACCINE  Completed  . PNA vac Low Risk Adult  Completed   Fall Risk  06/05/2017  Falls in the past year? No   Functional Status Survey:    Vitals:   11/01/17 1141  BP: (!) 110/57  Pulse: 75  Resp: 18  Temp: (!) 96.6 F (35.9 C)  TempSrc: Oral  SpO2: 99%   There is no height or weight on file to calculate BMI. Physical Exam  Constitutional: She appears well-developed and well-nourished.  HENT:  Head: Normocephalic.  Mouth/Throat: Oropharynx is clear and moist.  Eyes: Pupils are equal, round, and reactive to light.  Neck: Normal range of motion. Neck supple.  Cardiovascular: Normal rate. An irregular rhythm present.  No murmur heard. Pulmonary/Chest:  Effort normal and breath sounds normal. No stridor. No respiratory distress. She has no wheezes.  Abdominal: Soft. Bowel sounds are normal. She exhibits no distension. There is no tenderness. There is no guarding.  Musculoskeletal:  Mild edema Bilateral  Lymphadenopathy:    She has no cervical adenopathy.  Neurological: She is alert.  Skin: Skin is warm and dry.  Psychiatric: She has a normal mood and affect. Her behavior is normal. Thought content normal.  Nursing note and vitals reviewed.   Labs reviewed: Recent Labs    07/06/17 0830 08/08/17 0415 10/31/17 1608  NA 145 142 141  K 3.7 3.6 3.7  CL 107 108 103  CO2 29 27 26   GLUCOSE 79 83 114*  BUN 30* 33* 30*  CREATININE 1.22* 1.29* 1.22*  CALCIUM  9.0 8.6* 9.2  MG  --   --  2.4   Recent Labs    11/30/16 0748 03/07/17 1450 10/31/17 1608  AST 22 22 23   ALT 17 15 19   ALKPHOS 100 117 116  BILITOT 0.5 0.7 0.6  PROT 7.2 7.2 7.9  ALBUMIN 3.7 3.7 4.1   Recent Labs    06/05/17 0748 08/08/17 0415 10/31/17 1608  WBC 4.4 4.6 5.1  NEUTROABS 2.3 2.5 3.4  HGB 13.8 13.4 16.0*  HCT 42.6 40.3 50.4*  MCV 95.1 94.2 94.7  PLT 247 230 331   Lab Results  Component Value Date   TSH 0.969 10/31/2017   No results found for: HGBA1C Lab Results  Component Value Date   CHOL 145 09/19/2016   HDL 41 09/19/2016   LDLCALC 81 09/19/2016   TRIG 116 09/19/2016   CHOLHDL 3.5 09/19/2016    Significant Diagnostic Results in last 30 days:  Dg Chest Port 1 View  Result Date: 10/31/2017 CLINICAL DATA:  Sudden onset dizziness and weakness starting at 1500 hours. EXAM: PORTABLE CHEST 1 VIEW COMPARISON:  12/27/2015 FINDINGS: Stable cardiomegaly with moderate aortic atherosclerosis. Slight hyperinflation lungs without pulmonary consolidation, effusion or pneumothorax. Slight pulmonary vascular congestion is redemonstrated. Surgical clips project over the base of neck and superior mediastinum as before. Osteoarthritis of the included AC and glenohumeral joints slight high-riding of the right humeral head. IMPRESSION: Stable cardiomegaly with aortic atherosclerosis. Mild pulmonary vascular congestion. Electronically Signed   By: Tollie Eth M.D.   On: 10/31/2017 16:30    Assessment/Plan  Atrial Flutter Patient does have a history of paroxysmal A. fib but has been in sinus rhythm since her DCCV and has been on amiodarone Right now she was irregular and is on low-dose of Lopressor She is also on low-dose of aspirin.  At this time will wait for her to see Dr. Wyline Mood.  Her appointment is scheduled in 2 weeks Her blood pressure stable on metoprolol   Family/ staff Communication:   Labs/tests ordered:

## 2017-11-02 LAB — URINE CULTURE

## 2017-11-04 ENCOUNTER — Emergency Department (HOSPITAL_COMMUNITY): Payer: Medicare Other

## 2017-11-04 ENCOUNTER — Encounter (HOSPITAL_COMMUNITY): Payer: Self-pay

## 2017-11-04 ENCOUNTER — Inpatient Hospital Stay (HOSPITAL_COMMUNITY)
Admission: EM | Admit: 2017-11-04 | Discharge: 2017-11-06 | DRG: 065 | Disposition: A | Discharge status: Skilled Nursing Facility | Payer: Medicare Other | Attending: Internal Medicine | Admitting: Internal Medicine

## 2017-11-04 ENCOUNTER — Other Ambulatory Visit: Payer: Self-pay

## 2017-11-04 DIAGNOSIS — Z79890 Hormone replacement therapy (postmenopausal): Secondary | ICD-10-CM | POA: Diagnosis not present

## 2017-11-04 DIAGNOSIS — I779 Disorder of arteries and arterioles, unspecified: Secondary | ICD-10-CM | POA: Diagnosis present

## 2017-11-04 DIAGNOSIS — R29810 Facial weakness: Secondary | ICD-10-CM | POA: Diagnosis present

## 2017-11-04 DIAGNOSIS — Z7189 Other specified counseling: Secondary | ICD-10-CM | POA: Diagnosis not present

## 2017-11-04 DIAGNOSIS — R29724 NIHSS score 24: Secondary | ICD-10-CM | POA: Diagnosis present

## 2017-11-04 DIAGNOSIS — R4781 Slurred speech: Secondary | ICD-10-CM | POA: Diagnosis present

## 2017-11-04 DIAGNOSIS — G8194 Hemiplegia, unspecified affecting left nondominant side: Secondary | ICD-10-CM | POA: Diagnosis present

## 2017-11-04 DIAGNOSIS — Z7982 Long term (current) use of aspirin: Secondary | ICD-10-CM | POA: Diagnosis not present

## 2017-11-04 DIAGNOSIS — R1310 Dysphagia, unspecified: Secondary | ICD-10-CM | POA: Diagnosis present

## 2017-11-04 DIAGNOSIS — E785 Hyperlipidemia, unspecified: Secondary | ICD-10-CM | POA: Diagnosis present

## 2017-11-04 DIAGNOSIS — R490 Dysphonia: Secondary | ICD-10-CM

## 2017-11-04 DIAGNOSIS — I639 Cerebral infarction, unspecified: Secondary | ICD-10-CM | POA: Diagnosis present

## 2017-11-04 DIAGNOSIS — R400 Somnolence: Secondary | ICD-10-CM | POA: Diagnosis present

## 2017-11-04 DIAGNOSIS — Z66 Do not resuscitate: Secondary | ICD-10-CM | POA: Diagnosis present

## 2017-11-04 DIAGNOSIS — I1310 Hypertensive heart and chronic kidney disease without heart failure, with stage 1 through stage 4 chronic kidney disease, or unspecified chronic kidney disease: Secondary | ICD-10-CM | POA: Diagnosis present

## 2017-11-04 DIAGNOSIS — Z993 Dependence on wheelchair: Secondary | ICD-10-CM | POA: Diagnosis not present

## 2017-11-04 DIAGNOSIS — Z79899 Other long term (current) drug therapy: Secondary | ICD-10-CM | POA: Diagnosis not present

## 2017-11-04 DIAGNOSIS — Z89411 Acquired absence of right great toe: Secondary | ICD-10-CM

## 2017-11-04 DIAGNOSIS — E039 Hypothyroidism, unspecified: Secondary | ICD-10-CM | POA: Diagnosis present

## 2017-11-04 DIAGNOSIS — Z515 Encounter for palliative care: Secondary | ICD-10-CM | POA: Diagnosis present

## 2017-11-04 DIAGNOSIS — I252 Old myocardial infarction: Secondary | ICD-10-CM

## 2017-11-04 DIAGNOSIS — I2510 Atherosclerotic heart disease of native coronary artery without angina pectoris: Secondary | ICD-10-CM | POA: Diagnosis present

## 2017-11-04 DIAGNOSIS — I739 Peripheral vascular disease, unspecified: Secondary | ICD-10-CM | POA: Diagnosis present

## 2017-11-04 DIAGNOSIS — Z9862 Peripheral vascular angioplasty status: Secondary | ICD-10-CM | POA: Diagnosis not present

## 2017-11-04 DIAGNOSIS — I10 Essential (primary) hypertension: Secondary | ICD-10-CM | POA: Diagnosis not present

## 2017-11-04 DIAGNOSIS — I480 Paroxysmal atrial fibrillation: Secondary | ICD-10-CM | POA: Diagnosis present

## 2017-11-04 DIAGNOSIS — R011 Cardiac murmur, unspecified: Secondary | ICD-10-CM | POA: Diagnosis present

## 2017-11-04 DIAGNOSIS — N183 Chronic kidney disease, stage 3 unspecified: Secondary | ICD-10-CM | POA: Diagnosis present

## 2017-11-04 DIAGNOSIS — E7800 Pure hypercholesterolemia, unspecified: Secondary | ICD-10-CM | POA: Diagnosis not present

## 2017-11-04 LAB — COMPREHENSIVE METABOLIC PANEL
ALT: 16 U/L (ref 0–44)
AST: 20 U/L (ref 15–41)
Albumin: 3.8 g/dL (ref 3.5–5.0)
Alkaline Phosphatase: 101 U/L (ref 38–126)
Anion gap: 8 (ref 5–15)
BUN: 20 mg/dL (ref 8–23)
CO2: 27 mmol/L (ref 22–32)
CREATININE: 1.07 mg/dL — AB (ref 0.44–1.00)
Calcium: 9 mg/dL (ref 8.9–10.3)
Chloride: 107 mmol/L (ref 98–111)
GFR, EST AFRICAN AMERICAN: 50 mL/min — AB (ref >60)
GFR, EST NON AFRICAN AMERICAN: 43 mL/min — AB (ref >60)
Glucose, Bld: 95 mg/dL (ref 70–99)
POTASSIUM: 4 mmol/L (ref 3.5–5.1)
SODIUM: 142 mmol/L (ref 135–145)
TOTAL PROTEIN: 7.5 g/dL (ref 6.5–8.1)
Total Bilirubin: 0.7 mg/dL (ref 0.3–1.2)

## 2017-11-04 LAB — I-STAT CHEM 8, ED
BUN: 23 mg/dL (ref 8–23)
CALCIUM ION: 1.12 mmol/L — AB (ref 1.15–1.40)
CHLORIDE: 106 mmol/L (ref 98–111)
Creatinine, Ser: 1.2 mg/dL — ABNORMAL HIGH (ref 0.44–1.00)
Glucose, Bld: 92 mg/dL (ref 70–99)
HCT: 43 % (ref 36.0–46.0)
Hemoglobin: 14.6 g/dL (ref 12.0–15.0)
Potassium: 4.2 mmol/L (ref 3.5–5.1)
SODIUM: 144 mmol/L (ref 135–145)
TCO2: 28 mmol/L (ref 22–32)

## 2017-11-04 LAB — CBC WITH DIFFERENTIAL/PLATELET
ABS IMMATURE GRANULOCYTES: 0.01 10*3/uL (ref 0.00–0.07)
BASOS PCT: 1 %
Basophils Absolute: 0 10*3/uL (ref 0.0–0.1)
EOS ABS: 0.1 10*3/uL (ref 0.0–0.5)
Eosinophils Relative: 1 %
HCT: 44.4 % (ref 36.0–46.0)
Hemoglobin: 14.1 g/dL (ref 12.0–15.0)
IMMATURE GRANULOCYTES: 0 %
Lymphocytes Relative: 26 %
Lymphs Abs: 1.6 10*3/uL (ref 0.7–4.0)
MCH: 30.5 pg (ref 26.0–34.0)
MCHC: 31.8 g/dL (ref 30.0–36.0)
MCV: 95.9 fL (ref 80.0–100.0)
MONO ABS: 0.5 10*3/uL (ref 0.1–1.0)
Monocytes Relative: 9 %
NEUTROS ABS: 3.7 10*3/uL (ref 1.7–7.7)
NEUTROS PCT: 63 %
PLATELETS: 260 10*3/uL (ref 150–400)
RBC: 4.63 MIL/uL (ref 3.87–5.11)
RDW: 12.9 % (ref 11.5–15.5)
WBC: 5.9 10*3/uL (ref 4.0–10.5)
nRBC: 0 % (ref 0.0–0.2)

## 2017-11-04 LAB — PROTIME-INR
INR: 1.09
PROTHROMBIN TIME: 14 s (ref 11.4–15.2)

## 2017-11-04 MED ORDER — SODIUM CHLORIDE 0.9 % IV SOLN
INTRAVENOUS | Status: DC
  Administered 2017-11-04 – 2017-11-06 (×3): via INTRAVENOUS

## 2017-11-04 MED ORDER — ACETAMINOPHEN 325 MG PO TABS: 650.0000 mg | ORAL_TABLET | Freq: Four times a day (QID) | ORAL | Status: DC | PRN

## 2017-11-04 MED ORDER — LORAZEPAM 2 MG/ML IJ SOLN: 1.0000 mg | INTRAMUSCULAR | Status: DC | PRN

## 2017-11-04 MED ORDER — LORAZEPAM 2 MG/ML PO CONC
1.0000 mg | ORAL | Status: DC | PRN
  Filled 2017-11-04: qty 0.5

## 2017-11-04 MED ORDER — GLYCOPYRROLATE 0.2 MG/ML IJ SOLN: 0.2000 mg | INTRAMUSCULAR | Status: DC | PRN

## 2017-11-04 MED ORDER — MORPHINE SULFATE (CONCENTRATE) 10 MG/0.5ML PO SOLN: 5.0000 mg | ORAL | Status: DC | PRN

## 2017-11-04 MED ORDER — ONDANSETRON 4 MG PO TBDP: 4.0000 mg | ORAL_TABLET | Freq: Four times a day (QID) | ORAL | Status: DC | PRN

## 2017-11-04 MED ORDER — ACETAMINOPHEN 650 MG RE SUPP: 650.0000 mg | Freq: Four times a day (QID) | RECTAL | Status: DC | PRN

## 2017-11-04 MED ORDER — LORAZEPAM 1 MG PO TABS: 1.0000 mg | ORAL_TABLET | ORAL | Status: DC | PRN

## 2017-11-04 MED ORDER — POLYVINYL ALCOHOL 1.4 % OP SOLN: 1.0000 [drp] | Freq: Four times a day (QID) | OPHTHALMIC | Status: DC | PRN

## 2017-11-04 MED ORDER — ONDANSETRON HCL 4 MG/2ML IJ SOLN: 4.0000 mg | Freq: Four times a day (QID) | INTRAMUSCULAR | Status: DC | PRN

## 2017-11-04 MED ORDER — MORPHINE SULFATE (PF) 2 MG/ML IV SOLN: 1.0000 mg | INTRAVENOUS | Status: DC | PRN

## 2017-11-04 MED ORDER — BIOTENE DRY MOUTH MT LIQD: 15.0000 mL | OROMUCOSAL | Status: DC | PRN

## 2017-11-04 MED ORDER — IOPAMIDOL (ISOVUE-370) INJECTION 76%
115.0000 mL | Freq: Once | INTRAVENOUS | Status: AC | PRN
  Administered 2017-11-04: 115 mL via INTRAVENOUS

## 2017-11-04 MED ORDER — GLYCOPYRROLATE 1 MG PO TABS: 1.0000 mg | ORAL_TABLET | ORAL | Status: DC | PRN

## 2017-11-04 MED ORDER — BISACODYL 10 MG RE SUPP: 10.0000 mg | Freq: Every day | RECTAL | Status: DC | PRN

## 2017-11-04 MED ORDER — SODIUM CHLORIDE 0.9 % IV SOLN
Freq: Once | INTRAVENOUS | Status: AC
  Administered 2017-11-04: 13:00:00 via INTRAVENOUS

## 2017-11-04 MED ORDER — ASPIRIN 300 MG RE SUPP
300.0000 mg | Freq: Once | RECTAL | Status: AC
  Administered 2017-11-04: 300 mg via RECTAL
  Filled 2017-11-04: qty 1

## 2017-11-04 NOTE — ED Triage Notes (Signed)
Pt sent from [facility] due to left side hemiplegia, left sided facial droop, slurred speech,pt gazing  To right side. Able to answer some questions. Last known well 7Pm last night. Per EMS pt is normally able to get up with minimal assistance

## 2017-11-04 NOTE — ED Notes (Signed)
HOB lowered to 30 degrees per Neuro.

## 2017-11-04 NOTE — Progress Notes (Signed)
11/04/2017 2:55 PM   I spoke with patient's family at bedside.  They had discussed with the teleneurologist and they have decided that they wanted patient to remain at [facility] and to pursue full comfort care.  They declined to transfer patient to [facility].  They understand that we don't have neurology coverage at [facility]. They want to pursue full comfort care at this time.  Pt is DNR.  Will DC bed request for [facility] and place bed request for [facility].     Maryln Manuel MD

## 2017-11-04 NOTE — ED Notes (Signed)
Purewick placed on pt. 

## 2017-11-04 NOTE — Progress Notes (Signed)
MD ordered full comfort measures including comfort feeds. Family is aware. Pt ate half an ice cream cup and is resting peacefully. Will continue to monitor.

## 2017-11-04 NOTE — Consult Note (Signed)
   [facility] TeleNeurology Consult Services  Impression: Patient is a 84 yo female with a PMH of Afib (in NSR s/p cardioversion in the past), on ASA, HTN, PVD, HLD , long term nursing home resident who was sent from NH this AM with dense left sided weakness and deficits. At baseline, she is wheelchair dependent and requires functional assistance with ADLs, cognitively intact. LNK 19:00. Case reviewed with ED MD earlier, STAT CTA head and neck and CT perfusion recommended given concern for LVO.Imaging revealed a distal right M1 occlusion with large area of penumbra of in the right MCA territory, Core of 45 mm.   Recommendations:  Case reviewed with NIR and ED MD. Per NIR she is not a candidate given premorbid functional status and comorbities.  HOB <30 degrees Recommend ICU admission, likely malignant MCA stroke expected significant cytotoxic edema, consider hypertonic saline per protocol ASA 300mg  PR IVF, with NS Neurochecks Q1-2 hours Consider repeat CTH in AM, earlier PRN, MRI brain w/o, PT,OT, ST, ECHO, lipid panel, A1C, Euglycemia avoid hyperthermia, PRN acetaminophen dvt ppx  Recommendations d/w ED MD Rec inpatient neurology consultation   ---------------------------------------------------------------------  CC: stroke  History of Present Illness:   Patient is a 84 yo female with a PMH of Afib (in NSR s/p cardioversion in the past), on ASA, HTN, PVD, HLD , long term nursing home resident who was sent from NH this AM with dense left sided weakness and deficits.   Diagnostic Testing: As above  Vital Signs:   Reviewed  Exam:  NIHSS of 24 Patient is drowsy Dysarthric speech with hesitancy, limited spontaneous speech Non following commands consistently Dense left UE and LE paresis Left sided hemiinattention Complete left sided sensory loss Left facial droop Suspected left field cut Right gaze preference   Medical Decision Making:  - Extensive number of  diagnosis or management options are considered above.   - Extensive amount of complex data reviewed.   - High risk of complication and/or morbidity or mortality are associated with differential diagnostic considerations above.  - There may be uncertain outcome and increased probability of prolonged functional impairment or high probability of severe prolonged functional impairment associated with some of these differential diagnosis.   Medical Data Reviewed:  1.Data reviewed include clinical labs, radiology,  Medical Tests;   2.Tests results discussed w/performing or interpreting physician;   3.Obtaining/reviewing old medical records;  4.Obtaining case history from another source;  5.Independent review of image, tracing or specimen.    Patient was informed the Neurology Consult would happen via telehealth (remote video) and consented to receiving care in this manner.

## 2017-11-04 NOTE — H&P (Signed)
History and Physical  [name] DGL:[id number] DOB: 07-Feb-1920 DOA: 11/04/2017  Referring physician: Roderic Palau MD PCP: Virgie Dad, MD   Chief Complaint: slurred speech  HPI: [name] is a 84 y.o. female resident of [facility] with PMH significant for Afib, HTN, PVD, who had apparently been at her baseline up until a few days ago was sent to ED with slurred speech, left sided facial droop, dense left hemiplegia and rightward gaze.  Pt was last known to be well at 7pm last evening but family says that patient had not been herself for last several days and they had taken her to be seen by her PCP on 10/31.  Pt had normally been fairly active and independent for her age and had been able to get up with minimal assistance but now is not able to ambulate or move any extremities on the left.    ED Course: Code Stroke was called in the ED and the patient was seen by a teleneurologist.  A CTA head and neck was ordered.  Imaging revealed a distal right M1 occlusion with large area of penumbra in the right MCA territory.  The patient was determined to NOT be a candidate for intervention per NIR given premorbid functional status and comorbidities. The patient's family decided against transfer to [facility] and decided to stay at [facility] and to pursue full comfort care.       Review of Systems: Unable to obtain due to patient's condition.  Past Medical History:  Diagnosis Date  . Acid reflux   . Carotid artery occlusion    minimal  . Coronary artery disease   . Hypercholesteremia   . Hypertension   . Myocardial infarct ([place])   . PAF (paroxysmal atrial fibrillation) ([place])   . Skin disorder   . Systolic murmur   . Thyroid disease    Past Surgical History:  Procedure Laterality Date  . ABDOMINAL HYSTERECTOMY    . AMPUTATION Right 01/05/2016   Procedure: RIGHT GREAT TOE AMPUTATION;  Surgeon: Serafina Mitchell, MD;  Location: [facility];  Service: Vascular;  Laterality: Right;  .  CARDIAC CATHETERIZATION    . CARDIOVERSION  2012  . IR GENERIC HISTORICAL  12/30/2015   IR ANGIOGRAM EXTREMITY BILATERAL 12/30/2015 [facility]  . IR GENERIC HISTORICAL  12/30/2015   IR ANGIOGRAM SELECTIVE EACH ADDITIONAL VESSEL 12/30/2015 [facility]  . LOWER EXTREMITY ANGIOGRAM Right 12/30/2015   Procedure: RIGHT LOWER EXTREMITY ANGIOGRAM;  Surgeon: Vickie Epley, MD;  Location: [facility];  Service: Vascular;  Laterality: Right;  accessed through left groin area - dressed with two 2x2 and 4x4 opsite  . PERIPHERAL VASCULAR CATHETERIZATION N/A 01/04/2016   Procedure: Lower Extremity Angiography;  Surgeon: Serafina Mitchell, MD;  Location: [place] [facility];  Service: Cardiovascular;  Laterality: N/A;  . PERIPHERAL VASCULAR CATHETERIZATION Right 01/04/2016   Procedure: Peripheral Vascular Atherectomy;  Surgeon: Serafina Mitchell, MD;  Location: [place] [facility];  Service: Cardiovascular;  Laterality: Right;  Peroneal  . PERIPHERAL VASCULAR CATHETERIZATION N/A 02/01/2016   Procedure: Abdominal Aortogram w/Lower Extremity;  Surgeon: Serafina Mitchell, MD;  Location: [place] [facility];  Service: Cardiovascular;  Laterality: N/A;  . PERIPHERAL VASCULAR CATHETERIZATION  02/01/2016   Procedure: Peripheral Vascular Atherectomy;  Surgeon: Serafina Mitchell, MD;  Location: [place] [facility];  Service: Cardiovascular;;  Left   . THYROID SURGERY     Social History:  reports that she has never smoked.  She has never used smokeless tobacco. She reports that she does not drink alcohol or use drugs.  No Known Allergies  Family History  Family history unknown: Yes    Prior to Admission medications   Medication Sig Start Date End Date Taking? Authorizing Provider  acetaminophen (TYLENOL) 325 MG tablet Take 650 mg by mouth every 4 (four) hours as needed.    Yes [provider]  amiodarone (PACERONE) 200 MG tablet Take 0.5 tablets (100 mg total) by mouth daily. 08/08/12  Yes Terance Ice, MD    amLODipine (NORVASC) 5 MG tablet Take 5 mg by mouth daily.   Yes [provider]  aspirin EC 81 MG tablet Take 1 tablet (81 mg total) by mouth daily. 03/26/13  Yes BranchAlphonse Guild, MD  atorvastatin (LIPITOR) 20 MG tablet Take 20 mg by mouth every other day. Take 1 tablet (20 mg) by mouth every other night   Yes [provider]  docusate sodium (COLACE) 100 MG capsule Take 100 mg by mouth daily.   Yes [provider]  furosemide (LASIX) 20 MG tablet Take 20 mg by mouth daily.   Yes [provider]  levothyroxine (SYNTHROID, LEVOTHROID) 100 MCG tablet Take 100 mcg by mouth daily before breakfast.   Yes [provider]  metoprolol succinate (TOPROL-XL) 25 MG 24 hr tablet Take 0.5 tablets (12.5 mg total) by mouth daily. 10/31/17  Yes Noemi Chapel, MD  polyethylene glycol [facility] / GLYCOLAX) packet Take 17 g by mouth daily.   Yes [provider]  ranitidine (ZANTAC) 150 MG tablet Take 150 mg by mouth daily.    Yes [provider]  senna (SENOKOT) 8.6 MG TABS tablet Take 2 tablets by mouth daily.    Yes [provider]   Physical Exam: Vitals:   11/04/17 1300 11/04/17 1330 11/04/17 1400 11/04/17 1430  BP: (!) 175/64 (!) 168/54 (!) 158/59 (!) 169/58  Pulse: 64 63 (!) 57 63  Resp: (!) _0 Temp:      TempSrc:      SpO2: 100% 100% 100% 100%  Weight:      Height:        General exam: Pt is drowsy but arousable.  Some vocalizations but slurred speech noted.  NAD. Constant rightward gaze unable to gaze to left.  Left facial droop.   Head, eyes and ENT: Nontraumatic and normocephalic. Rightward gaze. Oral mucosa moist.  Neck: Supple. No JVD, carotid bruit or thyromegaly.  Lymphatics: No lymphadenopathy.  Respiratory system: Clear to auscultation. No increased work of breathing.  Cardiovascular system: normal S1 and S2 heard.    Gastrointestinal system: Abdomen is nondistended, soft and nontender. Normal bowel  sounds heard. No organomegaly or masses appreciated.  Central nervous system: easily arousable, recognizes family members, dense left hemiparesis involving UE/LE.   Extremities:  Normal tone.   Skin: No rashes or acute findings.  Musculoskeletal system: No acute findings.   Psychiatry: Pleasant and cooperative.  Labs on Admission:  Basic Metabolic Panel: Recent Labs  Lab 10/31/17 1608 11/04/17 0752 11/04/17 0757  NA 141 142 144  K 3.7 4.0 4.2  CL 103 107 106  CO2 26 27  --   GLUCOSE 114* 95 92  BUN 30* 20 23  CREATININE 1.22* 1.07* 1.20*  CALCIUM 9.2 9.0  --   MG 2.4  --   --    Liver Function Tests: Recent Labs  Lab 10/31/17 1608 11/04/17 0752  AST 23 20  ALT 19 16  ALKPHOS 116 101  BILITOT 0.6 0.7  PROT 7.9 7.5  ALBUMIN 4.1 3.8   No results for input(s): LIPASE, AMYLASE in the last 168 hours. No results for input(s): AMMONIA in the last 168 hours. CBC: Recent Labs  Lab 10/31/17 1608 11/04/17 0752 11/04/17 0757  WBC 5.1 5.9  --   NEUTROABS 3.4 3.7  --   HGB 16.0* 14.1 14.6  HCT 50.4* 44.4 43.0  MCV 94.7 95.9  --   PLT 331 260  --    Cardiac Enzymes: Recent Labs  Lab 10/31/17 1608  TROPONINI <0.03    BNP (last 3 results) No results for input(s): PROBNP in the last 8760 hours. CBG: No results for input(s): GLUCAP in the last 168 hours.  Radiological Exams on Admission: Ct Angio Head W Or Wo Contrast  Result Date: 11/04/2017 CLINICAL DATA:  Acute onset of left-sided hemiplegia, left-sided facial droop, and slurred speech. Rightward gaze. Last known well at 7 p.m. last evening. EXAM: CT ANGIOGRAPHY HEAD AND NECK CT PERFUSION BRAIN TECHNIQUE: Multidetector CT imaging of the head and neck was performed using the standard protocol during bolus administration of intravenous contrast. Multiplanar CT image reconstructions and MIPs were obtained to evaluate the vascular anatomy. Carotid stenosis measurements (when applicable) are obtained utilizing NASCET  criteria, using the distal internal carotid diameter as the denominator. Multiphase CT imaging of the brain was performed following IV bolus contrast injection. Subsequent parametric perfusion maps were calculated using RAPID software. CONTRAST:  147m ISOVUE-370 IOPAMIDOL (ISOVUE-370) INJECTION 76% COMPARISON:  CT head without contrast 11/04/2017. FINDINGS: CTA NECK FINDINGS Aortic arch: There is common origin of the left common carotid artery and innominate artery. Atherosclerotic changes are present at the arch and at the origins of the great vessels without significant stenosis. There is no aneurysm. Right carotid system: The right common carotid artery demonstrates distal wall calcifications. There are calcifications at the carotid bifurcation without significant stenosis. Moderate tortuosity is present in the cervical right ICA. There is wall irregularity and beading without focal stenosis or dissection. Left carotid system: The left common carotid artery demonstrates distal mural calcifications. Distal calcifications are in the left common carotid artery distally without significant luminal stenosis. Additional calcifications are present at the bifurcation significant stenosis. There is moderate tortuosity of the cervical left ICA. Vessel irregularity is present without significant stenosis. Vertebral arteries: Atherosclerotic calcifications are present at the origins of vertebral arteries bilaterally. There is moderate stenosis at the origin of the left vertebral artery, dominant vessel. Marked tortuosity is present both vertebral arteries without significant stenosis otherwise. Skeleton: Multilevel degenerative changes are present in the cervical spine. Uncovertebral spurring is most evident at C5-6, left greater than right. There is foraminal narrowing associated. No focal lytic or blastic lesions are present. The patient is edentulous. Other neck: Patient is status post left thyroidectomy. Heterogeneity  of the right lobe is present without a dominant lesion. No focal mucosal or submucosal abnormalities are present otherwise. The subcutaneous 14 mm nodule present just below the right mandible. This is likely a sebaceous cyst. Upper chest: The lung apices are clear.  Thoracic inlet is normal. Review of the MIP images confirms the above findings CTA HEAD FINDINGS Anterior circulation: Dense atherosclerotic calcifications are present throughout the cavernous internal carotid arteries bilaterally. There is no significant stenosis relative to the more distal vessel through the ICA termini. The A1 segments are normal bilaterally. The anterior communicating artery is patent. The left M1 segment is intact. The  right M1 segment is occluded distally. The left MCA bifurcation is within normal limits. Left MCA branch vessels are intact. ACA branch vessels are normal bilaterally. There is marked attenuation right MCA branch vessels distal to the occlusion with minimal collateralization. Posterior circulation: The PICA origins are visualized and normal. Distal vertebral arteries are unremarkable. Vertebrobasilar junction is normal. Basilar artery is normal. The left posterior cerebral artery originates from the basilar tip. The right posterior cerebral artery is of fetal type. Mild irregularity of PCA branch vessels bilaterally. No significant proximal stenosis or occlusion is present. There is no aneurysm. Venous sinuses: Dural sinuses are patent. Straight sinus deep cerebral veins are intact. Cortical veins are unremarkable. Anatomic variants: None Review of the MIP images confirms the above findings CT Brain Perfusion Findings: CBF (<30%) Volume: 19m Perfusion (Tmax>6.0s) volume: 149mMismatch Volume: 9761mnfarction Location:Right MCA territory IMPRESSION: 1. Emergent large vessel occlusion involving the distal right M1 segment. Poor collateralization. 2. Right MCA territory infarct with estimated volume of 45 mL. 3. Larger  area of penumbra involving the majority of the right MCA territory, 142 mL. 4. 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 5. Moderate stenosis at the origin of the dominant left vertebral artery. 6. Tortuosity and vessel irregularity of the cervical internal carotid arteries bilaterally suggesting fibromuscular dysplasia. No associated stenosis or dissection. 7. Moderate diffuse small vessel disease throughout the left MCA branch vessels, bilateral ACA branch vessels, and bilateral PCA branch vessels. These results were called by telephone at the time of interpretation on 11/04/2017 at 11:28 am to Dr. JOSMilton Fergusonwho verbally acknowledged these results. Electronically Signed   By: ChrSan MorelleD.   On: 11/04/2017 11:48   Ct Head Wo Contrast  Result Date: 11/04/2017 CLINICAL DATA:  [facility] ED note. [facility] side hemiplegia, left sided facial droop, slurred speech,pt gazing To right side. Able to answer some questions. Last known well 7Pm last night. Per EMS pt is normally able to g.*comment was truncated*Altered level of consciousness (LOC), unexplained EXAM: CT HEAD WITHOUT CONTRAST TECHNIQUE: Contiguous axial images were obtained from the base of the skull through the vertex without intravenous contrast. COMPARISON:  Head CT 04/20/2006 FINDINGS: Brain: No acute intracranial hemorrhage. No focal mass lesion. No CT evidence of acute infarction. No midline shift or mass effect. No hydrocephalus. Basilar cisterns are patent. Extensive periventricular white matter hypodensities are similar to but advanced from CT 2008. Cortical atrophy noted. No hydrocephalus. Vascular: No hyperdense vessel or unexpected calcification. Skull: Normal. Negative for fracture or focal lesion. Sinuses/Orbits: Paranasal sinuses and mastoid air  cells are clear. Orbits are clear. Other: None. IMPRESSION: 1. No acute intracranial findings. 2. Extensive periventricular white matter hypodensities progressed from 2008 most suggestive of small vessel ischemia. 3. Atrophy. 4. No hydrocephalus. Electronically Signed   By: SteSuzy BouchardD.   On: 11/04/2017 09:38   Ct Angio Neck W And/or Wo Contrast  Result Date: 11/04/2017 CLINICAL DATA:  Acute onset of left-sided hemiplegia, left-sided facial droop, and slurred speech. Rightward gaze. Last known well at 7 p.m. last evening. EXAM: CT ANGIOGRAPHY HEAD AND NECK CT PERFUSION BRAIN TECHNIQUE: Multidetector CT imaging of the head and neck was performed using the standard protocol during bolus administration of intravenous contrast. Multiplanar CT image reconstructions and MIPs were obtained to evaluate the vascular anatomy. Carotid stenosis measurements (when  applicable) are obtained utilizing NASCET criteria, using the distal internal carotid diameter as the denominator. Multiphase CT imaging of the brain was performed following IV bolus contrast injection. Subsequent parametric perfusion maps were calculated using RAPID software. CONTRAST:  165m ISOVUE-370 IOPAMIDOL (ISOVUE-370) INJECTION 76% COMPARISON:  CT head without contrast 11/04/2017. FINDINGS: CTA NECK FINDINGS Aortic arch: There is common origin of the left common carotid artery and innominate artery. Atherosclerotic changes are present at the arch and at the origins of the great vessels without significant stenosis. There is no aneurysm. Right carotid system: The right common carotid artery demonstrates distal wall calcifications. There are calcifications at the carotid bifurcation without significant stenosis. Moderate tortuosity is present in the cervical right ICA. There is wall irregularity and beading without focal stenosis or dissection. Left carotid system: The left common carotid artery demonstrates distal mural calcifications. Distal  calcifications are in the left common carotid artery distally without significant luminal stenosis. Additional calcifications are present at the bifurcation significant stenosis. There is moderate tortuosity of the cervical left ICA. Vessel irregularity is present without significant stenosis. Vertebral arteries: Atherosclerotic calcifications are present at the origins of vertebral arteries bilaterally. There is moderate stenosis at the origin of the left vertebral artery, dominant vessel. Marked tortuosity is present both vertebral arteries without significant stenosis otherwise. Skeleton: Multilevel degenerative changes are present in the cervical spine. Uncovertebral spurring is most evident at C5-6, left greater than right. There is foraminal narrowing associated. No focal lytic or blastic lesions are present. The patient is edentulous. Other neck: Patient is status post left thyroidectomy. Heterogeneity of the right lobe is present without a dominant lesion. No focal mucosal or submucosal abnormalities are present otherwise. The subcutaneous 14 mm nodule present just below the right mandible. This is likely a sebaceous cyst. Upper chest: The lung apices are clear.  Thoracic inlet is normal. Review of the MIP images confirms the above findings CTA HEAD FINDINGS Anterior circulation: Dense atherosclerotic calcifications are present throughout the cavernous internal carotid arteries bilaterally. There is no significant stenosis relative to the more distal vessel through the ICA termini. The A1 segments are normal bilaterally. The anterior communicating artery is patent. The left M1 segment is intact. The right M1 segment is occluded distally. The left MCA bifurcation is within normal limits. Left MCA branch vessels are intact. ACA branch vessels are normal bilaterally. There is marked attenuation right MCA branch vessels distal to the occlusion with minimal collateralization. Posterior circulation: The PICA  origins are visualized and normal. Distal vertebral arteries are unremarkable. Vertebrobasilar junction is normal. Basilar artery is normal. The left posterior cerebral artery originates from the basilar tip. The right posterior cerebral artery is of fetal type. Mild irregularity of PCA branch vessels bilaterally. No significant proximal stenosis or occlusion is present. There is no aneurysm. Venous sinuses: Dural sinuses are patent. Straight sinus deep cerebral veins are intact. Cortical veins are unremarkable. Anatomic variants: None Review of the MIP images confirms the above findings CT Brain Perfusion Findings: CBF (<30%) Volume: 466mPerfusion (Tmax>6.0s) volume: 14266mismatch Volume: 51m64mfarction Location:Right MCA territory IMPRESSION: 1. Emergent large vessel occlusion involving the distal right M1 segment. Poor collateralization. 2. Right MCA territory infarct with estimated volume of 45 mL. 3. Larger area of penumbra involving the majority of the right MCA territory, 142 mL. 4. 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 5. Moderate stenosis  at the origin of the dominant left vertebral artery. 6. Tortuosity and vessel irregularity of the cervical internal carotid arteries bilaterally suggesting fibromuscular dysplasia. No associated stenosis or dissection. 7. Moderate diffuse small vessel disease throughout the left MCA branch vessels, bilateral ACA branch vessels, and bilateral PCA branch vessels. These results were called by telephone at the time of interpretation on 11/04/2017 at 11:28 am to Dr. Milton Ferguson , who verbally acknowledged these results. Electronically Signed   By: San Morelle M.D.   On: 11/04/2017 11:48   Ct Cerebral Perfusion W Contrast  Result Date: 11/04/2017 CLINICAL DATA:  Acute onset of left-sided  hemiplegia, left-sided facial droop, and slurred speech. Rightward gaze. Last known well at 7 p.m. last evening. EXAM: CT ANGIOGRAPHY HEAD AND NECK CT PERFUSION BRAIN TECHNIQUE: Multidetector CT imaging of the head and neck was performed using the standard protocol during bolus administration of intravenous contrast. Multiplanar CT image reconstructions and MIPs were obtained to evaluate the vascular anatomy. Carotid stenosis measurements (when applicable) are obtained utilizing NASCET criteria, using the distal internal carotid diameter as the denominator. Multiphase CT imaging of the brain was performed following IV bolus contrast injection. Subsequent parametric perfusion maps were calculated using RAPID software. CONTRAST:  176m ISOVUE-370 IOPAMIDOL (ISOVUE-370) INJECTION 76% COMPARISON:  CT head without contrast 11/04/2017. FINDINGS: CTA NECK FINDINGS Aortic arch: There is common origin of the left common carotid artery and innominate artery. Atherosclerotic changes are present at the arch and at the origins of the great vessels without significant stenosis. There is no aneurysm. Right carotid system: The right common carotid artery demonstrates distal wall calcifications. There are calcifications at the carotid bifurcation without significant stenosis. Moderate tortuosity is present in the cervical right ICA. There is wall irregularity and beading without focal stenosis or dissection. Left carotid system: The left common carotid artery demonstrates distal mural calcifications. Distal calcifications are in the left common carotid artery distally without significant luminal stenosis. Additional calcifications are present at the bifurcation significant stenosis. There is moderate tortuosity of the cervical left ICA. Vessel irregularity is present without significant stenosis. Vertebral arteries: Atherosclerotic calcifications are present at the origins of vertebral arteries bilaterally. There is moderate  stenosis at the origin of the left vertebral artery, dominant vessel. Marked tortuosity is present both vertebral arteries without significant stenosis otherwise. Skeleton: Multilevel degenerative changes are present in the cervical spine. Uncovertebral spurring is most evident at C5-6, left greater than right. There is foraminal narrowing associated. No focal lytic or blastic lesions are present. The patient is edentulous. Other neck: Patient is status post left thyroidectomy. Heterogeneity of the right lobe is present without a dominant lesion. No focal mucosal or submucosal abnormalities are present otherwise. The subcutaneous 14 mm nodule present just below the right mandible. This is likely a sebaceous cyst. Upper chest: The lung apices are clear.  Thoracic inlet is normal. Review of the MIP images confirms the above findings CTA HEAD FINDINGS Anterior circulation: Dense atherosclerotic calcifications are present throughout the cavernous internal carotid arteries bilaterally. There is no significant stenosis relative to the more distal vessel through the ICA termini. The A1 segments are normal bilaterally. The anterior communicating artery is patent. The left M1 segment is intact. The right M1 segment is occluded distally. The left MCA bifurcation is within normal limits. Left MCA branch vessels are intact. ACA branch vessels are normal bilaterally. There is marked attenuation right MCA branch vessels distal to the occlusion with minimal collateralization. Posterior circulation: The PICA origins are  visualized and normal. Distal vertebral arteries are unremarkable. Vertebrobasilar junction is normal. Basilar artery is normal. The left posterior cerebral artery originates from the basilar tip. The right posterior cerebral artery is of fetal type. Mild irregularity of PCA branch vessels bilaterally. No significant proximal stenosis or occlusion is present. There is no aneurysm. Venous sinuses: Dural sinuses are  patent. Straight sinus deep cerebral veins are intact. Cortical veins are unremarkable. Anatomic variants: None Review of the MIP images confirms the above findings CT Brain Perfusion Findings: CBF (<30%) Volume: 82m Perfusion (Tmax>6.0s) volume: 1483mMismatch Volume: 9756mnfarction Location:Right MCA territory IMPRESSION: 1. Emergent large vessel occlusion involving the distal right M1 segment. Poor collateralization. 2. Right MCA territory infarct with estimated volume of 45 mL. 3. Larger area of penumbra involving the majority of the right MCA territory, 142 mL. 4. 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 5. Moderate stenosis at the origin of the dominant left vertebral artery. 6. Tortuosity and vessel irregularity of the cervical internal carotid arteries bilaterally suggesting fibromuscular dysplasia. No associated stenosis or dissection. 7. Moderate diffuse small vessel disease throughout the left MCA branch vessels, bilateral ACA branch vessels, and bilateral PCA branch vessels. These results were called by telephone at the time of interpretation on 11/04/2017 at 11:28 am to Dr. JOSMilton Fergusonwho verbally acknowledged these results. Electronically Signed   By: ChrSan MorelleD.   On: 11/04/2017 11:48   Assessment/Plan Principal Problem:   Acute CVA (cerebrovascular accident) (HCCCrestonctive Problems:   CAD (coronary artery disease)   HTN (hypertension)   Hyperlipidemia   Heart murmur   PAOD (peripheral arterial occlusive disease) (HCCHanamaulu History of angioplasty of peripheral vessel   Status post amputation of great toe, right (HCC)   Paroxysmal atrial fibrillation (HCC)   Chronic renal insufficiency, stage 3 (moderate) (HCC)   Hypothyroidism   Dysphagia   Acute left hemiparesis (HCC)   Dysphonia   1. Acute large right  MCA infarction - Pt has a dense left hemiparesis and per neuro-IR patient is not a candidate for intervention.  Family discussed with teleneurologist and met with me and they have decided to pursue full comfort care.  [facility].  They have decided to gather with patient in a private room and requested full comfort care.  I have initiated full comfort care orders and requested assistance from the palliative medicine team for symptom management.  I have requested assistance from social worker for residential hospice placement.   Code Status: DNR  Family Communication: family fully updated at bedside   Disposition Plan: residential hospice vs hospital death   Time spent: 62 50ns  ClaIrwin BrakemanD [facility] Pager [phone] 7PM-7AM, please contact night-coverage [email or web address] Password [email or web address] 11/04/2017, 2:58 PM

## 2017-11-04 NOTE — ED Provider Notes (Signed)
[facility] EMERGENCY DEPARTMENT Provider Note   CSN: [id number] Arrival date & time: 11/04/17  0740     History   Chief Complaint Chief Complaint  Patient presents with  . Cerebrovascular Accident    HPI [name] is a 84 y.o. female.  Patient was last seen normal at 7 PM yesterday she was brought to the emergency department today because she had left facial drooping and could not move her left arm or left leg.  The history is provided by the patient and the nursing home. No language interpreter was used.  Cerebrovascular Accident  This is a new problem. The current episode started 12 to 24 hours ago. The problem occurs constantly. The problem has not changed since onset.Pertinent negatives include no chest pain. Nothing aggravates the symptoms. Nothing relieves the symptoms. She has tried nothing for the symptoms. The treatment provided no relief.    Past Medical History:  Diagnosis Date  . Acid reflux   . Carotid artery occlusion    minimal  . Coronary artery disease   . Hypercholesteremia   . Hypertension   . Myocardial infarct (HCC)   . PAF (paroxysmal atrial fibrillation) (HCC)   . Skin disorder   . Systolic murmur   . Thyroid disease     Patient Active Problem List   Diagnosis Date Noted  . Acute CVA (cerebrovascular accident) (HCC) 11/04/2017  . Atrial flutter (HCC) 11/01/2017  . Paroxysmal atrial fibrillation (HCC) 04/06/2016  . Bilateral leg edema 04/06/2016  . Chronic renal insufficiency, stage 3 (moderate) (HCC) 04/06/2016  . Hypothyroidism 04/06/2016  . History of angioplasty of peripheral vessel 03/28/2016  . Status post amputation of great toe, right (HCC) 03/28/2016  . PAOD (peripheral arterial occlusive disease) (HCC) 02/01/2016  . CAD (coronary artery disease) 03/26/2013  . HTN (hypertension) 03/26/2013  . Hyperlipidemia 03/26/2013  . Heart murmur 03/26/2013    Past Surgical History:  Procedure Laterality Date  . ABDOMINAL  HYSTERECTOMY    . AMPUTATION Right 01/05/2016   Procedure: RIGHT GREAT TOE AMPUTATION;  Surgeon: Nada Libman, MD;  Location: [facility] OR;  Service: Vascular;  Laterality: Right;  . CARDIAC CATHETERIZATION    . CARDIOVERSION  2012  . IR GENERIC HISTORICAL  12/30/2015   IR ANGIOGRAM EXTREMITY BILATERAL 12/30/2015 [facility]  . IR GENERIC HISTORICAL  12/30/2015   IR ANGIOGRAM SELECTIVE EACH ADDITIONAL VESSEL 12/30/2015 [facility]  . LOWER EXTREMITY ANGIOGRAM Right 12/30/2015   Procedure: RIGHT LOWER EXTREMITY ANGIOGRAM;  Surgeon: Ancil Linsey, MD;  Location: [facility];  Service: Vascular;  Laterality: Right;  accessed through left groin area - dressed with two 2x2 and 4x4 opsite  . PERIPHERAL VASCULAR CATHETERIZATION N/A 01/04/2016   Procedure: Lower Extremity Angiography;  Surgeon: Nada Libman, MD;  Location: [facility] INVASIVE CV LAB;  Service: Cardiovascular;  Laterality: N/A;  . PERIPHERAL VASCULAR CATHETERIZATION Right 01/04/2016   Procedure: Peripheral Vascular Atherectomy;  Surgeon: Nada Libman, MD;  Location: [facility] INVASIVE CV LAB;  Service: Cardiovascular;  Laterality: Right;  Peroneal  . PERIPHERAL VASCULAR CATHETERIZATION N/A 02/01/2016   Procedure: Abdominal Aortogram w/Lower Extremity;  Surgeon: Nada Libman, MD;  Location: [facility] INVASIVE CV LAB;  Service: Cardiovascular;  Laterality: N/A;  . PERIPHERAL VASCULAR CATHETERIZATION  02/01/2016   Procedure: Peripheral Vascular Atherectomy;  Surgeon: Nada Libman, MD;  Location: [facility] INVASIVE CV LAB;  Service: Cardiovascular;;  Left   . THYROID SURGERY       OB History   None  Home Medications    Prior to Admission medications   Medication Sig Start Date End Date Taking? Authorizing Provider  acetaminophen (TYLENOL) 325 MG tablet Take 650 mg by mouth every 4 (four) hours as needed.    Yes [provider]  amiodarone (PACERONE) 200 MG tablet Take 0.5 tablets (100 mg total) by mouth daily. 08/08/12  Yes Susa Griffins, MD  amLODipine (NORVASC) 5 MG tablet Take 5 mg by mouth daily.   Yes [provider]  aspirin EC 81 MG tablet Take 1 tablet (81 mg total) by mouth daily. 03/26/13  Yes BranchDorothe Pea, MD  atorvastatin (LIPITOR) 20 MG tablet Take 20 mg by mouth every other day. Take 1 tablet (20 mg) by mouth every other night   Yes [provider]  docusate sodium (COLACE) 100 MG capsule Take 100 mg by mouth daily.   Yes [provider]  furosemide (LASIX) 20 MG tablet Take 20 mg by mouth daily.   Yes [provider]  levothyroxine (SYNTHROID, LEVOTHROID) 100 MCG tablet Take 100 mcg by mouth daily before breakfast.   Yes [provider]  metoprolol succinate (TOPROL-XL) 25 MG 24 hr tablet Take 0.5 tablets (12.5 mg total) by mouth daily. 10/31/17  Yes Eber Hong, MD  polyethylene glycol [facility] / GLYCOLAX) packet Take 17 g by mouth daily.   Yes [provider]  ranitidine (ZANTAC) 150 MG tablet Take 150 mg by mouth daily.    Yes [provider]  senna (SENOKOT) 8.6 MG TABS tablet Take 2 tablets by mouth daily.    Yes [provider]    Family History Family History  Family history unknown: Yes    Social History Social History   Tobacco Use  . Smoking status: Never Smoker  . Smokeless tobacco: Never Used  Substance Use Topics  . Alcohol use: No    Alcohol/week: 0.0 standard drinks  . Drug use: No     Allergies   Patient has no known allergies.   Review of Systems Review of Systems  Unable to perform ROS: Mental status change  Cardiovascular: Negative for chest pain.     Physical Exam Updated Vital Signs BP (!) 158/59   Pulse (!) 57   Temp 97.9 F (36.6 C) (Oral)   Resp 20   Ht 5\' 5"  (1.651 m)   Wt 60 kg   SpO2 100%   BMI 22.01 kg/m   Physical Exam  Constitutional: She appears well-developed.  HENT:  Head: Normocephalic.  Eyes: Conjunctivae and EOM are normal. No scleral icterus.  Neck: Neck  supple. No thyromegaly present.  Cardiovascular: Normal rate and regular rhythm. Exam reveals no gallop and no friction rub.  No murmur heard. Pulmonary/Chest: No stridor. She has no wheezes. She has no rales. She exhibits no tenderness.  Abdominal: She exhibits no distension. There is no tenderness. There is no rebound.  Musculoskeletal: Normal range of motion. She exhibits no edema.  Patient with left facial drooping  Lymphadenopathy:    She has no cervical adenopathy.  Neurological: She is alert. She exhibits normal muscle tone. Coordination normal.  Completely flaccid left arm and left leg  Skin: No rash noted. No erythema.  Psychiatric: She has a normal mood and affect. Her behavior is normal.     ED Treatments / Results  Labs (all labs ordered are listed, but only abnormal results are displayed) Labs Reviewed  COMPREHENSIVE METABOLIC PANEL - Abnormal; Notable for the following components:  Result Value   Creatinine, Ser 1.07 (*)    GFR calc non Af Amer 43 (*)    GFR calc Af Amer 50 (*)    All other components within normal limits  I-STAT CHEM 8, ED - Abnormal; Notable for the following components:   Creatinine, Ser 1.20 (*)    Calcium, Ion 1.12 (*)    All other components within normal limits  CBC WITH DIFFERENTIAL/PLATELET  PROTIME-INR    EKG None  Radiology Ct Angio Head W Or Wo Contrast  Result Date: 11/04/2017 CLINICAL DATA:  Acute onset of left-sided hemiplegia, left-sided facial droop, and slurred speech. Rightward gaze. Last known well at 7 p.m. last evening. EXAM: CT ANGIOGRAPHY HEAD AND NECK CT PERFUSION BRAIN TECHNIQUE: Multidetector CT imaging of the head and neck was performed using the standard protocol during bolus administration of intravenous contrast. Multiplanar CT image reconstructions and MIPs were obtained to evaluate the vascular anatomy. Carotid stenosis measurements (when applicable) are obtained utilizing NASCET criteria, using the distal  internal carotid diameter as the denominator. Multiphase CT imaging of the brain was performed following IV bolus contrast injection. Subsequent parametric perfusion maps were calculated using RAPID software. CONTRAST:  ISOVUE-370 IOPAMIDOL (ISOVUE-370) INJECTION 76% COMPARISON:  CT head without contrast 11/04/2017. FINDINGS: CTA NECK FINDINGS Aortic arch: There is common origin of the left common carotid artery and innominate artery. Atherosclerotic changes are present at the arch and at the origins of the great vessels without significant stenosis. There is no aneurysm. Right carotid system: The right common carotid artery demonstrates distal wall calcifications. There are calcifications at the carotid bifurcation without significant stenosis. Moderate tortuosity is present in the cervical right ICA. There is wall irregularity and beading without focal stenosis or dissection. Left carotid system: The left common carotid artery demonstrates distal mural calcifications. Distal calcifications are in the left common carotid artery distally without significant luminal stenosis. Additional calcifications are present at the bifurcation significant stenosis. There is moderate tortuosity of the cervical left ICA. Vessel irregularity is present without significant stenosis. Vertebral arteries: Atherosclerotic calcifications are present at the origins of vertebral arteries bilaterally. There is moderate stenosis at the origin of the left vertebral artery, dominant vessel. Marked tortuosity is present both vertebral arteries without significant stenosis otherwise. Skeleton: Multilevel degenerative changes are present in the cervical spine. Uncovertebral spurring is most evident at C5-6, left greater than right. There is foraminal narrowing associated. No focal lytic or blastic lesions are present. The patient is edentulous. Other neck: Patient is status post left thyroidectomy. Heterogeneity of the right lobe is present  without a dominant lesion. No focal mucosal or submucosal abnormalities are present otherwise. The subcutaneous 14 mm nodule present just below the right mandible. This is likely a sebaceous cyst. Upper chest: The lung apices are clear.  Thoracic inlet is normal. Review of the MIP images confirms the above findings CTA HEAD FINDINGS Anterior circulation: Dense atherosclerotic calcifications are present throughout the cavernous internal carotid arteries bilaterally. There is no significant stenosis relative to the more distal vessel through the ICA termini. The A1 segments are normal bilaterally. The anterior communicating artery is patent. The left M1 segment is intact. The right M1 segment is occluded distally. The left MCA bifurcation is within normal limits. Left MCA branch vessels are intact. ACA branch vessels are normal bilaterally. There is marked attenuation right MCA branch vessels distal to the occlusion with minimal collateralization. Posterior circulation: The PICA origins are visualized and normal. Distal vertebral arteries are  unremarkable. Vertebrobasilar junction is normal. Basilar artery is normal. The left posterior cerebral artery originates from the basilar tip. The right posterior cerebral artery is of fetal type. Mild irregularity of PCA branch vessels bilaterally. No significant proximal stenosis or occlusion is present. There is no aneurysm. Venous sinuses: Dural sinuses are patent. Straight sinus deep cerebral veins are intact. Cortical veins are unremarkable. Anatomic variants: None Review of the MIP images confirms the above findings CT Brain Perfusion Findings: CBF (<30%) Volume: 45mL Perfusion (Tmax>6.0s) volume: Mismatch Volume: 97mL Infarction Location:Right MCA territory IMPRESSION: 1. Emergent large vessel occlusion involving the distal right M1 segment. Poor collateralization. 2. Right MCA territory infarct with estimated volume of 45 mL. 3. Larger area of penumbra involving  the majority of the right MCA territory, 142 mL. 4. 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 5. Moderate stenosis at the origin of the dominant left vertebral artery. 6. Tortuosity and vessel irregularity of the cervical internal carotid arteries bilaterally suggesting fibromuscular dysplasia. No associated stenosis or dissection. 7. Moderate diffuse small vessel disease throughout the left MCA branch vessels, bilateral ACA branch vessels, and bilateral PCA branch vessels. These results were called by telephone at the time of interpretation on 11/04/2017 at 11:28 am to Dr. Bethann Berkshire , who verbally acknowledged these results. Electronically Signed   By: Marin Roberts M.D.   On: 11/04/2017 11:48   Ct Head Wo Contrast  Result Date: 11/04/2017 CLINICAL DATA:  Per ED note. Pt sent from [facility] due to left side hemiplegia, left sided facial droop, slurred speech,pt gazing To right side. Able to answer some questions. Last known well 7Pm last night. Per EMS pt is normally able to g.*comment was truncated*Altered level of consciousness (LOC), unexplained EXAM: CT HEAD WITHOUT CONTRAST TECHNIQUE: Contiguous axial images were obtained from the base of the skull through the vertex without intravenous contrast. COMPARISON:  Head CT 04/20/2006 FINDINGS: Brain: No acute intracranial hemorrhage. No focal mass lesion. No CT evidence of acute infarction. No midline shift or mass effect. No hydrocephalus. Basilar cisterns are patent. Extensive periventricular white matter hypodensities are similar to but advanced from CT 2008. Cortical atrophy noted. No hydrocephalus. Vascular: No hyperdense vessel or unexpected calcification. Skull: Normal. Negative for fracture or focal lesion. Sinuses/Orbits: Paranasal sinuses and mastoid air cells are clear. Orbits  are clear. Other: None. IMPRESSION: 1. No acute intracranial findings. 2. Extensive periventricular white matter hypodensities progressed from 2008 most suggestive of small vessel ischemia. 3. Atrophy. 4. No hydrocephalus. Electronically Signed   By: Genevive Bi M.D.   On: 11/04/2017 09:38   Ct Angio Neck W And/or Wo Contrast  Result Date: 11/04/2017 CLINICAL DATA:  Acute onset of left-sided hemiplegia, left-sided facial droop, and slurred speech. Rightward gaze. Last known well at 7 p.m. last evening. EXAM: CT ANGIOGRAPHY HEAD AND NECK CT PERFUSION BRAIN TECHNIQUE: Multidetector CT imaging of the head and neck was performed using the standard protocol during bolus administration of intravenous contrast. Multiplanar CT image reconstructions and MIPs were obtained to evaluate the vascular anatomy. Carotid stenosis measurements (when applicable) are obtained utilizing NASCET criteria, using the distal internal carotid diameter as the denominator. Multiphase CT imaging of the brain was performed following IV bolus contrast injection. Subsequent parametric perfusion maps were calculated using RAPID software. CONTRAST:  ISOVUE-370 IOPAMIDOL (ISOVUE-370) INJECTION 76% COMPARISON:  CT head without contrast 11/04/2017. FINDINGS: CTA  NECK FINDINGS Aortic arch: There is common origin of the left common carotid artery and innominate artery. Atherosclerotic changes are present at the arch and at the origins of the great vessels without significant stenosis. There is no aneurysm. Right carotid system: The right common carotid artery demonstrates distal wall calcifications. There are calcifications at the carotid bifurcation without significant stenosis. Moderate tortuosity is present in the cervical right ICA. There is wall irregularity and beading without focal stenosis or dissection. Left carotid system: The left common carotid artery demonstrates distal mural calcifications. Distal calcifications are in the  left common carotid artery distally without significant luminal stenosis. Additional calcifications are present at the bifurcation significant stenosis. There is moderate tortuosity of the cervical left ICA. Vessel irregularity is present without significant stenosis. Vertebral arteries: Atherosclerotic calcifications are present at the origins of vertebral arteries bilaterally. There is moderate stenosis at the origin of the left vertebral artery, dominant vessel. Marked tortuosity is present both vertebral arteries without significant stenosis otherwise. Skeleton: Multilevel degenerative changes are present in the cervical spine. Uncovertebral spurring is most evident at C5-6, left greater than right. There is foraminal narrowing associated. No focal lytic or blastic lesions are present. The patient is edentulous. Other neck: Patient is status post left thyroidectomy. Heterogeneity of the right lobe is present without a dominant lesion. No focal mucosal or submucosal abnormalities are present otherwise. The subcutaneous 14 mm nodule present just below the right mandible. This is likely a sebaceous cyst. Upper chest: The lung apices are clear.  Thoracic inlet is normal. Review of the MIP images confirms the above findings CTA HEAD FINDINGS Anterior circulation: Dense atherosclerotic calcifications are present throughout the cavernous internal carotid arteries bilaterally. There is no significant stenosis relative to the more distal vessel through the ICA termini. The A1 segments are normal bilaterally. The anterior communicating artery is patent. The left M1 segment is intact. The right M1 segment is occluded distally. The left MCA bifurcation is within normal limits. Left MCA branch vessels are intact. ACA branch vessels are normal bilaterally. There is marked attenuation right MCA branch vessels distal to the occlusion with minimal collateralization. Posterior circulation: The PICA origins are visualized and  normal. Distal vertebral arteries are unremarkable. Vertebrobasilar junction is normal. Basilar artery is normal. The left posterior cerebral artery originates from the basilar tip. The right posterior cerebral artery is of fetal type. Mild irregularity of PCA branch vessels bilaterally. No significant proximal stenosis or occlusion is present. There is no aneurysm. Venous sinuses: Dural sinuses are patent. Straight sinus deep cerebral veins are intact. Cortical veins are unremarkable. Anatomic variants: None Review of the MIP images confirms the above findings CT Brain Perfusion Findings: CBF (<30%) Volume: 45mL Perfusion (Tmax>6.0s) volume: Mismatch Volume: 97mL Infarction Location:Right MCA territory IMPRESSION: 1. Emergent large vessel occlusion involving the distal right M1 segment. Poor collateralization. 2. Right MCA territory infarct with estimated volume of 45 mL. 3. Larger area of penumbra involving the majority of the right MCA territory, 142 mL. 4. 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 5. Moderate stenosis at the origin of the dominant left vertebral artery. 6. Tortuosity and vessel irregularity of the cervical internal carotid arteries bilaterally suggesting fibromuscular dysplasia. No associated stenosis or dissection. 7. Moderate diffuse small vessel disease throughout the left MCA branch vessels, bilateral ACA branch vessels, and bilateral PCA branch vessels. These results were called  by telephone at the time of interpretation on 11/04/2017 at 11:28 am to Dr. Bethann Berkshire , who verbally acknowledged these results. Electronically Signed   By: Marin Roberts M.D.   On: 11/04/2017 11:48   Ct Cerebral Perfusion W Contrast  Result Date: 11/04/2017 CLINICAL DATA:  Acute onset of left-sided hemiplegia, left-sided facial droop,  and slurred speech. Rightward gaze. Last known well at 7 p.m. last evening. EXAM: CT ANGIOGRAPHY HEAD AND NECK CT PERFUSION BRAIN TECHNIQUE: Multidetector CT imaging of the head and neck was performed using the standard protocol during bolus administration of intravenous contrast. Multiplanar CT image reconstructions and MIPs were obtained to evaluate the vascular anatomy. Carotid stenosis measurements (when applicable) are obtained utilizing NASCET criteria, using the distal internal carotid diameter as the denominator. Multiphase CT imaging of the brain was performed following IV bolus contrast injection. Subsequent parametric perfusion maps were calculated using RAPID software. CONTRAST:  ISOVUE-370 IOPAMIDOL (ISOVUE-370) INJECTION 76% COMPARISON:  CT head without contrast 11/04/2017. FINDINGS: CTA NECK FINDINGS Aortic arch: There is common origin of the left common carotid artery and innominate artery. Atherosclerotic changes are present at the arch and at the origins of the great vessels without significant stenosis. There is no aneurysm. Right carotid system: The right common carotid artery demonstrates distal wall calcifications. There are calcifications at the carotid bifurcation without significant stenosis. Moderate tortuosity is present in the cervical right ICA. There is wall irregularity and beading without focal stenosis or dissection. Left carotid system: The left common carotid artery demonstrates distal mural calcifications. Distal calcifications are in the left common carotid artery distally without significant luminal stenosis. Additional calcifications are present at the bifurcation significant stenosis. There is moderate tortuosity of the cervical left ICA. Vessel irregularity is present without significant stenosis. Vertebral arteries: Atherosclerotic calcifications are present at the origins of vertebral arteries bilaterally. There is moderate stenosis at the origin of the left vertebral  artery, dominant vessel. Marked tortuosity is present both vertebral arteries without significant stenosis otherwise. Skeleton: Multilevel degenerative changes are present in the cervical spine. Uncovertebral spurring is most evident at C5-6, left greater than right. There is foraminal narrowing associated. No focal lytic or blastic lesions are present. The patient is edentulous. Other neck: Patient is status post left thyroidectomy. Heterogeneity of the right lobe is present without a dominant lesion. No focal mucosal or submucosal abnormalities are present otherwise. The subcutaneous 14 mm nodule present just below the right mandible. This is likely a sebaceous cyst. Upper chest: The lung apices are clear.  Thoracic inlet is normal. Review of the MIP images confirms the above findings CTA HEAD FINDINGS Anterior circulation: Dense atherosclerotic calcifications are present throughout the cavernous internal carotid arteries bilaterally. There is no significant stenosis relative to the more distal vessel through the ICA termini. The A1 segments are normal bilaterally. The anterior communicating artery is patent. The left M1 segment is intact. The right M1 segment is occluded distally. The left MCA bifurcation is within normal limits. Left MCA branch vessels are intact. ACA branch vessels are normal bilaterally. There is marked attenuation right MCA branch vessels distal to the occlusion with minimal collateralization. Posterior circulation: The PICA origins are visualized and normal. Distal vertebral arteries are unremarkable. Vertebrobasilar junction is normal. Basilar artery is normal. The left posterior cerebral artery originates from the basilar tip. The right posterior cerebral artery is of fetal type. Mild irregularity of PCA branch vessels bilaterally. No significant proximal stenosis or occlusion is present. There is no aneurysm.  Venous sinuses: Dural sinuses are patent. Straight sinus deep cerebral veins are  intact. Cortical veins are unremarkable. Anatomic variants: None Review of the MIP images confirms the above findings CT Brain Perfusion Findings: CBF (<30%) Volume: 45mL Perfusion (Tmax>6.0s) volume: Mismatch Volume: 97mL Infarction Location:Right MCA territory IMPRESSION: 1. Emergent large vessel occlusion involving the distal right M1 segment. Poor collateralization. 2. Right MCA territory infarct with estimated volume of 45 mL. 3. Larger area of penumbra involving the majority of the right MCA territory, 142 mL. 4. 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 5. Moderate stenosis at the origin of the dominant left vertebral artery. 6. Tortuosity and vessel irregularity of the cervical internal carotid arteries bilaterally suggesting fibromuscular dysplasia. No associated stenosis or dissection. 7. Moderate diffuse small vessel disease throughout the left MCA branch vessels, bilateral ACA branch vessels, and bilateral PCA branch vessels. These results were called by telephone at the time of interpretation on 11/04/2017 at 11:28 am to Dr. Bethann Berkshire , who verbally acknowledged these results. Electronically Signed   By: Marin Roberts M.D.   On: 11/04/2017 11:48    Procedures Procedures (including critical care time)  Medications Ordered in ED Medications  iopamidol (ISOVUE-370) 76 % injection 115 mL (115 mLs Intravenous Contrast Given 11/04/17 1103)  aspirin suppository 300 mg (300 mg Rectal Given 11/04/17 1321)  0.9 %  sodium chloride infusion ( Intravenous Rate/Dose Verify 11/04/17 1412)     Initial Impression / Assessment and Plan / ED Course  I have reviewed the triage vital signs and the nursing notes.  Pertinent labs & imaging results that were available during my care of the patient were reviewed by me and considered in my  medical decision making (see chart for details). Telemetry neurology was consulted and talked to the family about interventional treatment.  Then the telemetry neurologist spoke with interventionalist who stated the patient was not a candidate for interventional care.  She will be admitted to [facility] by the hospitalist with neurology consulting    CRITICAL CARE Performed by: Bethann Berkshire Total critical care time: 60 minutes Critical care time was exclusive of separately billable procedures and treating other patients. Critical care was necessary to treat or prevent imminent or life-threatening deterioration. Critical care was time spent personally by me on the following activities: development of treatment plan with patient and/or surrogate as well as nursing, discussions with consultants, evaluation of patient's response to treatment, examination of patient, obtaining history from patient or surrogate, ordering and performing treatments and interventions, ordering and review of laboratory studies, ordering and review of radiographic studies, pulse oximetry and re-evaluation of patient's condition.   Final Clinical Impressions(s) / ED Diagnoses   Final diagnoses:  Acute CVA (cerebrovascular accident) [facility])    ED Discharge Orders    None       Bethann Berkshire, MD 11/04/17 1426

## 2017-11-04 NOTE — ED Notes (Signed)
Tele Neuro consult taking place at this time.

## 2017-11-04 NOTE — ED Notes (Signed)
Pt in CT at this time.

## 2017-11-05 DIAGNOSIS — I639 Cerebral infarction, unspecified: Principal | ICD-10-CM

## 2017-11-05 DIAGNOSIS — G8194 Hemiplegia, unspecified affecting left nondominant side: Secondary | ICD-10-CM

## 2017-11-05 LAB — MRSA PCR SCREENING: MRSA BY PCR: NEGATIVE

## 2017-11-05 NOTE — Evaluation (Signed)
Clinical/Bedside Swallow Evaluation Patient Details  Name: [name] MRN: [id number] Date of Birth: 1920-08-11  Today's Date: 11/05/2017 Time: SLP Start Time (ACUTE ONLY): 1506 SLP Stop Time (ACUTE ONLY): 1529 SLP Time Calculation (min) (ACUTE ONLY): 23 min  Past Medical History:  Past Medical History:  Diagnosis Date  . Acid reflux   . Carotid artery occlusion    minimal  . Coronary artery disease   . Hypercholesteremia   . Hypertension   . Myocardial infarct (HCC)   . PAF (paroxysmal atrial fibrillation) (HCC)   . Skin disorder   . Systolic murmur   . Thyroid disease    Past Surgical History:  Past Surgical History:  Procedure Laterality Date  . ABDOMINAL HYSTERECTOMY    . AMPUTATION Right 01/05/2016   Procedure: RIGHT GREAT TOE AMPUTATION;  Surgeon: Nada Libman, MD;  Location: [facility] OR;  Service: Vascular;  Laterality: Right;  . CARDIAC CATHETERIZATION    . CARDIOVERSION  2012  . IR GENERIC HISTORICAL  12/30/2015   IR ANGIOGRAM EXTREMITY BILATERAL 12/30/2015 [facility]  . IR GENERIC HISTORICAL  12/30/2015   IR ANGIOGRAM SELECTIVE EACH ADDITIONAL VESSEL 12/30/2015 [facility]  . LOWER EXTREMITY ANGIOGRAM Right 12/30/2015   Procedure: RIGHT LOWER EXTREMITY ANGIOGRAM;  Surgeon: Ancil Linsey, MD;  Location: [facility];  Service: Vascular;  Laterality: Right;  accessed through left groin area - dressed with two 2x2 and 4x4 opsite  . PERIPHERAL VASCULAR CATHETERIZATION N/A 01/04/2016   Procedure: Lower Extremity Angiography;  Surgeon: Nada Libman, MD;  Location: [facility] INVASIVE CV LAB;  Service: Cardiovascular;  Laterality: N/A;  . PERIPHERAL VASCULAR CATHETERIZATION Right 01/04/2016   Procedure: Peripheral Vascular Atherectomy;  Surgeon: Nada Libman, MD;  Location: [facility] INVASIVE CV LAB;  Service: Cardiovascular;  Laterality: Right;  Peroneal  . PERIPHERAL VASCULAR CATHETERIZATION N/A 02/01/2016   Procedure: Abdominal Aortogram w/Lower Extremity;  Surgeon: Nada Libman, MD;  Location: [facility] INVASIVE CV LAB;  Service: Cardiovascular;  Laterality: N/A;  . PERIPHERAL VASCULAR CATHETERIZATION  02/01/2016   Procedure: Peripheral Vascular Atherectomy;  Surgeon: Nada Libman, MD;  Location: [facility] INVASIVE CV LAB;  Service: Cardiovascular;;  Left   . THYROID SURGERY     HPI:  [name] is a 84 y.o. female resident of [facility] with PMH significant for Afib, HTN, PVD, who had apparently been at her baseline up until a few days ago was sent to ED with slurred speech, left sided facial droop, dense left hemiplegia and rightward gaze.  Pt was last known to be well at 7pm last evening but family says that patient had not been herself for last several days and they had taken her to be seen by her PCP on 10/31.  Pt had normally been fairly active and independent for her age and had been able to get up with minimal assistance but now is not able to ambulate or move any extremities on the left.  Imaging revealed a distal right M1 occlusion with large area of penumbra in the right MCA territory.  The patient's family decided against transfer to [facility] and decided to stay at [facility] and to pursue full comfort care. MD requesting BSE to determine safest textures and consistencies for comfort feeds.    Assessment / Plan / Recommendation Clinical Impression  Pt presents with a L facial droop and decreased facial ROM and strength on the L. Pt was sitting upright in the bed for evaluation and did briefly  open her eyes; she responded and followed one step commands throughout assessment despite having her eyes closed for majority of the eval. Pt presents with s/sx of oropharyngeal dysphagia characterized by immediate coughing with thin liquids and mild oral residue after the swallow with puree textures. Mild wet vocal quality was observed after the swallow with NTL that was cleared with a repeat dry swallow. Recommend D1/puree diet and NECTAR thick liquids to  initiate comfort feeds. Single Ice chips are ok following oral care. ST will continue to follow acutely for goals of care SLP Visit Diagnosis: Dysphagia, unspecified (R13.10)    Aspiration Risk  Moderate aspiration risk    Diet Recommendation Dysphagia 1 (Puree);Nectar-thick liquid   Liquid Administration via: Spoon;Cup Medication Administration: Crushed with puree Compensations: Minimize environmental distractions;Small sips/bites;Follow solids with liquid;Monitor for anterior loss;Multiple dry swallows after each bite/sip Postural Changes: Seated upright at 90 degrees;Other (Comment)    Other  Recommendations Oral Care Recommendations: Oral care BID   Follow up Recommendations 24 hour supervision/assistance;Skilled Nursing facility      Frequency and Duration min 1 x/week  1 week       Prognosis Prognosis for Safe Diet Advancement: Fair Barriers to Reach Goals: Severity of deficits      Swallow Study   General Date of Onset: 11/04/17 HPI: [name] is a 84 y.o. female resident of [facility] with PMH significant for Afib, HTN, PVD, who had apparently been at her baseline up until a few days ago was sent to ED with slurred speech, left sided facial droop, dense left hemiplegia and rightward gaze.  Pt was last known to be well at 7pm last evening but family says that patient had not been herself for last several days and they had taken her to be seen by her PCP on 10/31.  Pt had normally been fairly active and independent for her age and had been able to get up with minimal assistance but now is not able to ambulate or move any extremities on the left.  Imaging revealed a distal right M1 occlusion with large area of penumbra in the right MCA territory.  The patient's family decided against transfer to [facility] and decided to stay at [facility] and to pursue full comfort care. MD requesting BSE to determine safest textures and consistencies for comfort feeds.  Type  of Study: Bedside Swallow Evaluation Previous Swallow Assessment: none in chart Temperature Spikes Noted: N/A Respiratory Status: Room air History of Recent Intubation: No Behavior/Cognition: Cooperative;Pleasant mood Oral Cavity Assessment: Dry Oral Care Completed by SLP: Yes Oral Cavity - Dentition: Missing dentition Vision: Impaired for self-feeding Self-Feeding Abilities: Needs assist;Total assist Patient Positioning: Upright in bed Baseline Vocal Quality: Low vocal intensity Volitional Cough: Weak Volitional Swallow: Able to elicit    Oral/Motor/Sensory Function Overall Oral Motor/Sensory Function: Moderate impairment Facial ROM: Reduced left Facial Symmetry: Abnormal symmetry left Facial Strength: Reduced left Facial Sensation: Reduced left   Ice Chips Ice chips: Within functional limits   Thin Liquid Thin Liquid: Impaired Presentation: Cup;Spoon Oral Phase Impairments: Reduced labial seal Oral Phase Functional Implications: Left anterior spillage Pharyngeal  Phase Impairments: Cough - Immediate;Throat Clearing - Delayed;Wet Vocal Quality    Nectar Thick Nectar Thick Liquid: Within functional limits   Honey Thick Honey Thick Liquid: Not tested   Puree Puree: Impaired Presentation: Spoon Oral Phase Impairments: Reduced labial seal;Poor awareness of bolus Oral Phase Functional Implications: Oral residue   Solid  Wavie Hashimi H. Myer Haff MA, CCC-SLP Speech  Language Pathologist    Solid: Not tested      Georgetta Haber 11/05/2017,3:47 PM

## 2017-11-05 NOTE — Progress Notes (Signed)
PROGRESS NOTE    [name]  ZOX:[id number] DOB: 05/10/1920 DOA: 11/04/2017 PCP: Mahlon Gammon, MD    Brief Narrative:  84 year old female who is relatively functional, admitted to the hospital with acute left hemiparesis.  Found to have large right MCA infarction.  Not be candidate for TPA or neuro intervention.  After discussing with neurology, family has elected to pursue comfort care.  Palliative care consulted.   Assessment & Plan:   Principal Problem:   Acute CVA (cerebrovascular accident) (HCC) Active Problems:   CAD (coronary artery disease)   HTN (hypertension)   Hyperlipidemia   Heart murmur   PAOD (peripheral arterial occlusive disease) (HCC)   History of angioplasty of peripheral vessel   Status post amputation of great toe, right (HCC)   Paroxysmal atrial fibrillation (HCC)   Chronic renal insufficiency, stage 3 (moderate) (HCC)   Hypothyroidism   Dysphagia   Acute left hemiparesis (HCC)   Dysphonia   1. Acute large right MCA infarction.  Patient noted to have dense left hemiparesis on discharge.  Was not felt to be a candidate for neuro intervention.  After discussing with tele-neurologist, family had decided to not transfer to [facility] or pursue further MRI.  Decided on full comfort care.  At this time patient is very lethargic.  We will continue observation for another 24 hours.  P.o. intake appears to have been minimal.  Palliative care has been consulted.  Will likely be an appropriate candidate for residential hospice, pending her mental status is not improved.   DVT prophylaxis: None Code Status: DNR Family Communication: Discussed with son at the bedside Disposition Plan: Pending hospital course   Consultants:     Procedures:     Antimicrobials:       Subjective: Patient does not follow commands or answer questions.  Objective: Vitals:   11/04/17 2037 11/05/17 0621 11/05/17 0955 11/05/17 1554  BP: (!) 181/65 (!) 169/67   (!) 143/66  Pulse: (!) 57 66  (!) 106  Resp: 16 18  18   Temp: 98.2 F (36.8 C) 98.7 F (37.1 C)  97.7 F (36.5 C)  TempSrc: Oral Oral  Oral  SpO2: 100% 100% 99% 98%  Weight:      Height:        Intake/Output Summary (Last 24 hours) at 11/05/2017 1932 Last data filed at 11/05/2017 1700 Gross per 24 hour  Intake 1219.71 ml  Output -  Net 1219.71 ml   Filed Weights   11/04/17 0747 11/04/17 1521  Weight: 60 kg 60 kg    Examination:  General exam: Appears calm and comfortable  Respiratory system: Clear to auscultation. Respiratory effort normal. Cardiovascular system: S1 & S2 heard, RRR. No JVD, murmurs, rubs, gallops or clicks. No pedal edema. Gastrointestinal system: Abdomen is nondistended, soft and nontender. No organomegaly or masses felt. Normal bowel sounds heard. Central nervous system: Patient is unresponsive, dense left hemiparesis involving upper and lower extremities Extremities: Symmetric 5 x 5 power. Skin: No rashes, lesions or ulcers Psychiatry: Unresponsive    Data Reviewed: I have personally reviewed following labs and imaging studies  CBC: Recent Labs  Lab 10/31/17 1608 11/04/17 0752 11/04/17 0757  WBC 5.1 5.9  --   NEUTROABS 3.4 3.7  --   HGB 16.0* 14.1 14.6  HCT 50.4* 44.4 43.0  MCV 94.7 95.9  --   PLT 331 260  --    Basic Metabolic Panel: Recent Labs  Lab 10/31/17 1608 11/04/17 0752 11/04/17 0757  NA 141 142 144  K 3.7 4.0 4.2  CL 103 107 106  CO2 26 27  --   GLUCOSE 114* 95 92  BUN 30* 20 23  CREATININE 1.22* 1.07* 1.20*  CALCIUM 9.2 9.0  --   MG 2.4  --   --    GFR: Estimated Creatinine Clearance: 25.2 mL/min (A) (by C-G formula based on SCr of 1.2 mg/dL (H)). Liver Function Tests: Recent Labs  Lab 10/31/17 1608 11/04/17 0752  AST 23 20  ALT 19 16  ALKPHOS 116 101  BILITOT 0.6 0.7  PROT 7.9 7.5  ALBUMIN 4.1 3.8   No results for input(s): LIPASE, AMYLASE in the last 168 hours. No results for input(s): AMMONIA in the  last 168 hours. Coagulation Profile: Recent Labs  Lab 11/04/17 0752  INR 1.09   Cardiac Enzymes: Recent Labs  Lab 10/31/17 1608  TROPONINI <0.03   BNP (last 3 results) No results for input(s): PROBNP in the last 8760 hours. HbA1C: No results for input(s): HGBA1C in the last 72 hours. CBG: No results for input(s): GLUCAP in the last 168 hours. Lipid Profile: No results for input(s): CHOL, HDL, LDLCALC, TRIG, CHOLHDL, LDLDIRECT in the last 72 hours. Thyroid Function Tests: No results for input(s): TSH, T4TOTAL, FREET4, T3FREE, THYROIDAB in the last 72 hours. Anemia Panel: No results for input(s): VITAMINB12, FOLATE, FERRITIN, TIBC, IRON, RETICCTPCT in the last 72 hours. Sepsis Labs: No results for input(s): PROCALCITON, LATICACIDVEN in the last 168 hours.  Recent Results (from the past 240 hour(s))  Urine Culture     Status: Abnormal   Collection Time: 10/31/17  4:07 PM  Result Value Ref Range Status   Specimen Description   Final    URINE, CATHETERIZED Performed at [facility], [address] [place], [place] [address]    Special Requests   Final    NONE Performed at [facility], [address], [place], [place] [address]    Culture (A)  Final    <10,000 COLONIES/mL INSIGNIFICANT GROWTH Performed at [facility], [address], [place], [place] [address]    Report Status 11/02/2017 FINAL  Final  MRSA PCR Screening     Status: None   Collection Time: 11/04/17  3:29 PM  Result Value Ref Range Status   MRSA by PCR NEGATIVE NEGATIVE Final    Comment:        The GeneXpert MRSA Assay (FDA approved for NASAL specimens only), is one component of a comprehensive MRSA colonization surveillance program. It is not intended to diagnose MRSA infection nor to guide or monitor treatment for MRSA infections. Performed at [facility], [address], [place], [place] [address]          Radiology Studies: Ct Angio Head W Or Wo Contrast  Result Date:  11/04/2017 CLINICAL DATA:  Acute onset of left-sided hemiplegia, left-sided facial droop, and slurred speech. Rightward gaze. Last known well at 7 p.m. last evening. EXAM: CT ANGIOGRAPHY HEAD AND NECK CT PERFUSION BRAIN TECHNIQUE: Multidetector CT imaging of the head and neck was performed using the standard protocol during bolus administration of intravenous contrast. Multiplanar CT image reconstructions and MIPs were obtained to evaluate the vascular anatomy. Carotid stenosis measurements (when applicable) are obtained utilizing NASCET criteria, using the distal internal carotid diameter as the denominator. Multiphase CT imaging of the brain was performed following IV bolus contrast injection. Subsequent parametric perfusion maps were calculated using RAPID software. CONTRAST:  ISOVUE-370 IOPAMIDOL (ISOVUE-370) INJECTION 76% COMPARISON:  CT head without contrast 11/04/2017. FINDINGS: CTA NECK FINDINGS Aortic arch: There is common origin of the left common carotid artery and innominate artery. Atherosclerotic changes are present at the arch and at the origins of the great vessels without significant stenosis. There is no aneurysm. Right carotid system: The right common carotid artery demonstrates distal wall calcifications. There are calcifications at the carotid bifurcation without significant stenosis. Moderate tortuosity is present in the cervical right ICA. There is wall irregularity and beading without focal stenosis or dissection. Left carotid system: The left common carotid artery demonstrates distal mural calcifications. Distal calcifications are in the left common carotid artery distally without significant luminal stenosis. Additional calcifications are present at the bifurcation significant stenosis. There is moderate tortuosity of the cervical left ICA. Vessel irregularity is present without significant stenosis. Vertebral arteries: Atherosclerotic calcifications are present at the origins of  vertebral arteries bilaterally. There is moderate stenosis at the origin of the left vertebral artery, dominant vessel. Marked tortuosity is present both vertebral arteries without significant stenosis otherwise. Skeleton: Multilevel degenerative changes are present in the cervical spine. Uncovertebral spurring is most evident at C5-6, left greater than right. There is foraminal narrowing associated. No focal lytic or blastic lesions are present. The patient is edentulous. Other neck: Patient is status post left thyroidectomy. Heterogeneity of the right lobe is present without a dominant lesion. No focal mucosal or submucosal abnormalities are present otherwise. The subcutaneous 14 mm nodule present just below the right mandible. This is likely a sebaceous cyst. Upper chest: The lung apices are clear.  Thoracic inlet is normal. Review of the MIP images confirms the above findings CTA HEAD FINDINGS Anterior circulation: Dense atherosclerotic calcifications are present throughout the cavernous internal carotid arteries bilaterally. There is no significant stenosis relative to the more distal vessel through the ICA termini. The A1 segments are normal bilaterally. The anterior communicating artery is patent. The left M1 segment is intact. The right M1 segment is occluded distally. The left MCA bifurcation is within normal limits. Left MCA branch vessels are intact. ACA branch vessels are normal bilaterally. There is marked attenuation right MCA branch vessels distal to the occlusion with minimal collateralization. Posterior circulation: The PICA origins are visualized and normal. Distal vertebral arteries are unremarkable. Vertebrobasilar junction is normal. Basilar artery is normal. The left posterior cerebral artery originates from the basilar tip. The right posterior cerebral artery is of fetal type. Mild irregularity of PCA branch vessels bilaterally. No significant proximal stenosis or occlusion is present. There  is no aneurysm. Venous sinuses: Dural sinuses are patent. Straight sinus deep cerebral veins are intact. Cortical veins are unremarkable. Anatomic variants: None Review of the MIP images confirms the above findings CT Brain Perfusion Findings: CBF (<30%) Volume: 45mL Perfusion (Tmax>6.0s) volume: Mismatch Volume: 97mL Infarction Location:Right MCA territory IMPRESSION: 1. Emergent large vessel occlusion involving the distal right M1 segment. Poor collateralization. 2. Right MCA territory infarct with estimated volume of 45 mL. 3. Larger area of penumbra involving the majority of the right MCA territory, 142 mL. 4. 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 5. Moderate stenosis at the origin of the dominant left vertebral artery. 6. Tortuosity and vessel irregularity of the cervical internal carotid arteries bilaterally suggesting fibromuscular dysplasia. No associated stenosis or dissection. 7. Moderate diffuse small vessel disease throughout the left MCA branch vessels, bilateral ACA branch vessels, and bilateral  PCA branch vessels. These results were called by telephone at the time of interpretation on 11/04/2017 at 11:28 am to Dr. Bethann Berkshire , who verbally acknowledged these results. Electronically Signed   By: Marin Roberts M.D.   On: 11/04/2017 11:48   Ct Head Wo Contrast  Result Date: 11/04/2017 CLINICAL DATA:  Per ED note. Pt sent from [facility] due to left side hemiplegia, left sided facial droop, slurred speech,pt gazing To right side. Able to answer some questions. Last known well 7Pm last night. Per EMS pt is normally able to g.*comment was truncated*Altered level of consciousness (LOC), unexplained EXAM: CT HEAD WITHOUT CONTRAST TECHNIQUE: Contiguous axial images were obtained from the base of the skull through the vertex  without intravenous contrast. COMPARISON:  Head CT 04/20/2006 FINDINGS: Brain: No acute intracranial hemorrhage. No focal mass lesion. No CT evidence of acute infarction. No midline shift or mass effect. No hydrocephalus. Basilar cisterns are patent. Extensive periventricular white matter hypodensities are similar to but advanced from CT 2008. Cortical atrophy noted. No hydrocephalus. Vascular: No hyperdense vessel or unexpected calcification. Skull: Normal. Negative for fracture or focal lesion. Sinuses/Orbits: Paranasal sinuses and mastoid air cells are clear. Orbits are clear. Other: None. IMPRESSION: 1. No acute intracranial findings. 2. Extensive periventricular white matter hypodensities progressed from 2008 most suggestive of small vessel ischemia. 3. Atrophy. 4. No hydrocephalus. Electronically Signed   By: Genevive Bi M.D.   On: 11/04/2017 09:38   Ct Angio Neck W And/or Wo Contrast  Result Date: 11/04/2017 CLINICAL DATA:  Acute onset of left-sided hemiplegia, left-sided facial droop, and slurred speech. Rightward gaze. Last known well at 7 p.m. last evening. EXAM: CT ANGIOGRAPHY HEAD AND NECK CT PERFUSION BRAIN TECHNIQUE: Multidetector CT imaging of the head and neck was performed using the standard protocol during bolus administration of intravenous contrast. Multiplanar CT image reconstructions and MIPs were obtained to evaluate the vascular anatomy. Carotid stenosis measurements (when applicable) are obtained utilizing NASCET criteria, using the distal internal carotid diameter as the denominator. Multiphase CT imaging of the brain was performed following IV bolus contrast injection. Subsequent parametric perfusion maps were calculated using RAPID software. CONTRAST:  ISOVUE-370 IOPAMIDOL (ISOVUE-370) INJECTION 76% COMPARISON:  CT head without contrast 11/04/2017. FINDINGS: CTA NECK FINDINGS Aortic arch: There is common origin of the left common carotid artery and innominate artery.  Atherosclerotic changes are present at the arch and at the origins of the great vessels without significant stenosis. There is no aneurysm. Right carotid system: The right common carotid artery demonstrates distal wall calcifications. There are calcifications at the carotid bifurcation without significant stenosis. Moderate tortuosity is present in the cervical right ICA. There is wall irregularity and beading without focal stenosis or dissection. Left carotid system: The left common carotid artery demonstrates distal mural calcifications. Distal calcifications are in the left common carotid artery distally without significant luminal stenosis. Additional calcifications are present at the bifurcation significant stenosis. There is moderate tortuosity of the cervical left ICA. Vessel irregularity is present without significant stenosis. Vertebral arteries: Atherosclerotic calcifications are present at the origins of vertebral arteries bilaterally. There is moderate stenosis at the origin of the left vertebral artery, dominant vessel. Marked tortuosity is present both vertebral arteries without significant stenosis otherwise. Skeleton: Multilevel degenerative changes are present in the cervical spine. Uncovertebral spurring is most evident at C5-6, left greater than right. There is foraminal narrowing associated. No focal lytic or blastic lesions are present. The patient is edentulous. Other neck: Patient is  status post left thyroidectomy. Heterogeneity of the right lobe is present without a dominant lesion. No focal mucosal or submucosal abnormalities are present otherwise. The subcutaneous 14 mm nodule present just below the right mandible. This is likely a sebaceous cyst. Upper chest: The lung apices are clear.  Thoracic inlet is normal. Review of the MIP images confirms the above findings CTA HEAD FINDINGS Anterior circulation: Dense atherosclerotic calcifications are present throughout the cavernous internal  carotid arteries bilaterally. There is no significant stenosis relative to the more distal vessel through the ICA termini. The A1 segments are normal bilaterally. The anterior communicating artery is patent. The left M1 segment is intact. The right M1 segment is occluded distally. The left MCA bifurcation is within normal limits. Left MCA branch vessels are intact. ACA branch vessels are normal bilaterally. There is marked attenuation right MCA branch vessels distal to the occlusion with minimal collateralization. Posterior circulation: The PICA origins are visualized and normal. Distal vertebral arteries are unremarkable. Vertebrobasilar junction is normal. Basilar artery is normal. The left posterior cerebral artery originates from the basilar tip. The right posterior cerebral artery is of fetal type. Mild irregularity of PCA branch vessels bilaterally. No significant proximal stenosis or occlusion is present. There is no aneurysm. Venous sinuses: Dural sinuses are patent. Straight sinus deep cerebral veins are intact. Cortical veins are unremarkable. Anatomic variants: None Review of the MIP images confirms the above findings CT Brain Perfusion Findings: CBF (<30%) Volume: 45mL Perfusion (Tmax>6.0s) volume: Mismatch Volume: 97mL Infarction Location:Right MCA territory IMPRESSION: 1. Emergent large vessel occlusion involving the distal right M1 segment. Poor collateralization. 2. Right MCA territory infarct with estimated volume of 45 mL. 3. Larger area of penumbra involving the majority of the right MCA territory, 142 mL. 4. 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 5. Moderate stenosis at the origin of the dominant left vertebral artery. 6. Tortuosity and vessel irregularity of the cervical internal carotid arteries bilaterally suggesting  fibromuscular dysplasia. No associated stenosis or dissection. 7. Moderate diffuse small vessel disease throughout the left MCA branch vessels, bilateral ACA branch vessels, and bilateral PCA branch vessels. These results were called by telephone at the time of interpretation on 11/04/2017 at 11:28 am to Dr. Bethann Berkshire , who verbally acknowledged these results. Electronically Signed   By: Marin Roberts M.D.   On: 11/04/2017 11:48   Ct Cerebral Perfusion W Contrast  Result Date: 11/04/2017 CLINICAL DATA:  Acute onset of left-sided hemiplegia, left-sided facial droop, and slurred speech. Rightward gaze. Last known well at 7 p.m. last evening. EXAM: CT ANGIOGRAPHY HEAD AND NECK CT PERFUSION BRAIN TECHNIQUE: Multidetector CT imaging of the head and neck was performed using the standard protocol during bolus administration of intravenous contrast. Multiplanar CT image reconstructions and MIPs were obtained to evaluate the vascular anatomy. Carotid stenosis measurements (when applicable) are obtained utilizing NASCET criteria, using the distal internal carotid diameter as the denominator. Multiphase CT imaging of the brain was performed following IV bolus contrast injection. Subsequent parametric perfusion maps were calculated using RAPID software. CONTRAST:  ISOVUE-370 IOPAMIDOL (ISOVUE-370) INJECTION 76% COMPARISON:  CT head without contrast 11/04/2017. FINDINGS: CTA NECK FINDINGS Aortic arch: There is common origin of the left common carotid artery and innominate artery. Atherosclerotic changes are present at the arch and at the origins of the great vessels without significant stenosis. There is no aneurysm. Right  carotid system: The right common carotid artery demonstrates distal wall calcifications. There are calcifications at the carotid bifurcation without significant stenosis. Moderate tortuosity is present in the cervical right ICA. There is wall irregularity and beading without focal stenosis  or dissection. Left carotid system: The left common carotid artery demonstrates distal mural calcifications. Distal calcifications are in the left common carotid artery distally without significant luminal stenosis. Additional calcifications are present at the bifurcation significant stenosis. There is moderate tortuosity of the cervical left ICA. Vessel irregularity is present without significant stenosis. Vertebral arteries: Atherosclerotic calcifications are present at the origins of vertebral arteries bilaterally. There is moderate stenosis at the origin of the left vertebral artery, dominant vessel. Marked tortuosity is present both vertebral arteries without significant stenosis otherwise. Skeleton: Multilevel degenerative changes are present in the cervical spine. Uncovertebral spurring is most evident at C5-6, left greater than right. There is foraminal narrowing associated. No focal lytic or blastic lesions are present. The patient is edentulous. Other neck: Patient is status post left thyroidectomy. Heterogeneity of the right lobe is present without a dominant lesion. No focal mucosal or submucosal abnormalities are present otherwise. The subcutaneous 14 mm nodule present just below the right mandible. This is likely a sebaceous cyst. Upper chest: The lung apices are clear.  Thoracic inlet is normal. Review of the MIP images confirms the above findings CTA HEAD FINDINGS Anterior circulation: Dense atherosclerotic calcifications are present throughout the cavernous internal carotid arteries bilaterally. There is no significant stenosis relative to the more distal vessel through the ICA termini. The A1 segments are normal bilaterally. The anterior communicating artery is patent. The left M1 segment is intact. The right M1 segment is occluded distally. The left MCA bifurcation is within normal limits. Left MCA branch vessels are intact. ACA branch vessels are normal bilaterally. There is marked attenuation  right MCA branch vessels distal to the occlusion with minimal collateralization. Posterior circulation: The PICA origins are visualized and normal. Distal vertebral arteries are unremarkable. Vertebrobasilar junction is normal. Basilar artery is normal. The left posterior cerebral artery originates from the basilar tip. The right posterior cerebral artery is of fetal type. Mild irregularity of PCA branch vessels bilaterally. No significant proximal stenosis or occlusion is present. There is no aneurysm. Venous sinuses: Dural sinuses are patent. Straight sinus deep cerebral veins are intact. Cortical veins are unremarkable. Anatomic variants: None Review of the MIP images confirms the above findings CT Brain Perfusion Findings: CBF (<30%) Volume: 45mL Perfusion (Tmax>6.0s) volume: Mismatch Volume: 97mL Infarction Location:Right MCA territory IMPRESSION: 1. Emergent large vessel occlusion involving the distal right M1 segment. Poor collateralization. 2. Right MCA territory infarct with estimated volume of 45 mL. 3. Larger area of penumbra involving the majority of the right MCA territory, 142 mL. 4. 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 5. Moderate stenosis at the origin of the dominant left vertebral artery. 6. Tortuosity and vessel irregularity of the cervical internal carotid arteries bilaterally suggesting fibromuscular dysplasia. No associated stenosis or dissection. 7. Moderate diffuse small vessel disease throughout the left MCA branch vessels, bilateral ACA branch vessels, and bilateral PCA branch vessels. These results were called by telephone at the time of interpretation on 11/04/2017 at 11:28 am to Dr. Bethann Berkshire , who verbally acknowledged these results. Electronically Signed   By: Marin Roberts M.D.   On: 11/04/2017 11:48  Scheduled Meds: Continuous Infusions: . sodium chloride 50 mL/hr at 11/05/17 1700     LOS: 1 day    Time spent:    Erick Blinks, MD [facility] Pager [phone]  If 7PM-7AM, please contact night-coverage [email or web address] Password [facility] 11/05/2017, 7:32 PM

## 2017-11-05 NOTE — Progress Notes (Signed)
Rehab Admissions Coordinator Note:  Per SLP recommendation, Patient was screened by Nanine Means for appropriateness for an Inpatient Acute Rehab Consult.  At this time, pt is not appropriate for CIR as she is on full comfort care measures and unable to tolerate intensive therapies at this time.  Please call if questions   Nanine Means 11/05/2017, 5:49 PM  I can be reached at [phone].

## 2017-11-05 NOTE — Progress Notes (Signed)
Late entry: Palliative consult ordered this am, Lillia Carmel, NP notified of consult by nursing secretary this am. Stated someone will see patient tomorrow to complete consult. Notified Dr. Kerry Hough. Earnstine Regal, RN

## 2017-11-05 NOTE — Progress Notes (Signed)
SLP Cancellation Note  Patient Details Name: [name] MRN: [id number] DOB: [birth year]   Cancelled treatment:       Reason Eval/Treat Not Completed: Fatigue/lethargy limiting ability to participate. ST will re-attempt later today to determine safest diet for comfort feeds.   [name] H. Romie Levee, CCC-SLP Speech Language Pathologist    Georgetta Haber 11/05/2017, 10:09 AM

## 2017-11-06 ENCOUNTER — Inpatient Hospital Stay
Admission: RE | Admit: 2017-11-06 | Discharge: 2017-12-02 | Disposition: E | Payer: Medicare Other | Source: Ambulatory Visit | Attending: Internal Medicine | Admitting: Internal Medicine

## 2017-11-06 ENCOUNTER — Non-Acute Institutional Stay (SKILLED_NURSING_FACILITY): Payer: Medicare Other | Admitting: Internal Medicine

## 2017-11-06 ENCOUNTER — Encounter: Payer: Self-pay | Admitting: Internal Medicine

## 2017-11-06 DIAGNOSIS — I480 Paroxysmal atrial fibrillation: Secondary | ICD-10-CM

## 2017-11-06 DIAGNOSIS — Z7189 Other specified counseling: Secondary | ICD-10-CM

## 2017-11-06 DIAGNOSIS — G8194 Hemiplegia, unspecified affecting left nondominant side: Secondary | ICD-10-CM | POA: Diagnosis not present

## 2017-11-06 DIAGNOSIS — I639 Cerebral infarction, unspecified: Secondary | ICD-10-CM | POA: Diagnosis not present

## 2017-11-06 DIAGNOSIS — N183 Chronic kidney disease, stage 3 (moderate): Secondary | ICD-10-CM

## 2017-11-06 DIAGNOSIS — E039 Hypothyroidism, unspecified: Secondary | ICD-10-CM

## 2017-11-06 DIAGNOSIS — Z515 Encounter for palliative care: Secondary | ICD-10-CM

## 2017-11-06 DIAGNOSIS — I10 Essential (primary) hypertension: Secondary | ICD-10-CM | POA: Diagnosis not present

## 2017-11-06 DIAGNOSIS — E7800 Pure hypercholesterolemia, unspecified: Secondary | ICD-10-CM

## 2017-11-06 MED ORDER — GLYCOPYRROLATE 1 MG PO TABS: 1.0000 mg | ORAL_TABLET | ORAL | Status: AC | PRN

## 2017-11-06 MED ORDER — LORAZEPAM 2 MG/ML PO CONC: 1.0000 mg | ORAL | 0 refills | Status: AC | PRN

## 2017-11-06 MED ORDER — MORPHINE SULFATE (CONCENTRATE) 10 MG/0.5ML PO SOLN: 5.0000 mg | ORAL | 0 refills | Status: DC | PRN

## 2017-11-06 MED ORDER — ONDANSETRON 4 MG PO TBDP: 4.0000 mg | ORAL_TABLET | Freq: Four times a day (QID) | ORAL | 0 refills | Status: AC | PRN

## 2017-11-06 MED ORDER — BISACODYL 10 MG RE SUPP: 10.0000 mg | Freq: Every day | RECTAL | 0 refills | Status: AC | PRN

## 2017-11-06 NOTE — Progress Notes (Signed)
Patient discharging to [facility] this afternoon. CSW states paperwork has been completed and patient ready for discharge. Called report to Balta, receiving nurse at [facility]. Patient to be discharged and transported via bed accompanied by nurse techs. Family at bedside and aware of discharge plan. Discharge instructions sent in discharge packet. Earnstine Regal, RN

## 2017-11-06 NOTE — Care Management (Addendum)
Patient Information   SS# [id number]  Patient Name Maria, Mayo ([id number]) Sex Female DOB [birth year]  Room Bed  A339 A339-01  Patient Demographics   Address (Temporary) [address] [place] [address] Contact Numbers (Temporary) [phone]  Patient Ethnicity & Race   Ethnic Group Patient Race  Not Hispanic or Latino Black or [place] American  Emergency Contact(s)   Name Relation Home Work Mobile  Kovalenko,[name]  [phone]  [phone]  Darnell, Stimson  [phone]    Zeb Comfort Daughter [phone]    Documents on File    Status Date Received Description  Documents for the Patient  EMR Patient Summary Not Received    Driver's License Not Received    Outside Record Not Received    [place] HIPAA NOTICE OF PRIVACY - Scanned Received 11/24/10   [place] E-Signature HIPAA Notice of Privacy Received 12/16/10   [place] E-Signature HIPAA Notice of Privacy Spanish Not Received    Insurance Card Not Received    Advance Directives/Living Will/HCPOA/POA Not Received    Financial Application Not Received    Insurance Card Not Received    [place] HIPAA NOTICE OF PRIVACY - Scanned Not Received    Insurance Card Received 03/26/13   Insurance Card  03/26/13   AMB Correspondence  04/22/13 04/14-08/14 OFFICE NOTE [facility] CENTER  Insurance Card Received 02/11/14 blue medicare  HIM ROI Authorization (Expired) 02/20/14 Authorization for batch [place]/Blue [employer] [place]    ltd   02/20/2014.2  AMB Correspondence  01/22/14 PROGRESS NOTES GOLDING MD, J  Other Photo ID Not Received    AMB Correspondence  07/17/14 04/14-08/14 OFFICE NOTE [facility]  HIM ROI Authorization (Expired) 08/12/14 Authorization for batch [employer]/Blue [employer]    ltd    08/12/2014.8  HIM ROI Authorization (Expired) 11/06/14 Authorization for batch CIO[employer]/Blue Cross Blue Shield      fbg     11/04/14  HIM ROI Authorization  (Expired) 07/05/15 Authorization for batch [place]/Blue Cross Blue Shield Medicare Risk Adjustment fbg 07/05/2015  HIM ROI Authorization (Expired) 08/02/15 Authorization for batch [place]/BCBS of [place] Medicare Risk Adjustment Review  fbg  08/02/2015  VVS Policy for Pain - E Signature     Release of Information Not Received    Advanced Beneficiary Notice (ABN) Not Received    E-Signature AOB Spanish Not Received    Advance Directives/Living Will/HCPOA/POA Received 01/12/16 DNR ORDER 2018  Insurance Card Received 03/28/16 Medicaid retro to Jan 2018  Insurance Card   New Medicare 2018  Outside Record (Deleted) 05/16/09   Patient Photo   Photo of Patient  Documents for the Encounter  AOB (Assignment of Insurance Benefits) Received 11/04/17 Pt cannot sign due to condition-no family present  E-signature AOB     MEDICARE RIGHTS Received 11/04/17 Pt cannot sign due to condition-no family present  E-signature Medicare Rights     Cardiac Monitoring Strip Shift Summary Received 11/04/17   ED Patient Billing Extract   ED PB Billing Extract  Authorization Request Received 11/09/2017 NOA FAXED, INPT 11.1.19  EKG Received 11/05/17   Admission Information   Attending Provider Admitting Provider Admission Type Admission Date/Time  Erick Blinks, MD Cleora Fleet, MD Emergency 11/04/17 [phone]  Discharge Date Hospital Service Auth/Cert Status Service Area   Acute Care Incomplete [facility]  Unit Room/Bed Admission Status   AP-DEPT 300 A339/A339-01 Admission (Confirmed)   Admission   Complaint  cva  Hospital Account   Name Acct ID Class Status  Primary Coverage  Maria, Mayo [id number] Inpatient Open BLUE CROSS BLUE SHIELD MEDICARE - BCBS MEDICARE      Guarantor Account (for Hospital Account [id number])   Name Relation to Pt Service Area Active? Acct Type  Adela Lank [facility] Self CHSA Yes Personal/Family  Address Phone    [address], [place] [address]  [phone](H)        Coverage Information (for Hospital Account [id number])    1. BLUE CROSS BLUE SHIELD MEDICARE/BCBS MEDICARE   F/O Payor/Plan Precert #  [facility] SHIELD MEDICARE/BCBS MEDICARE   Subscriber Subscriber #  Jilleen, Essner [id number]  Address Phone  [address] [address] [place], [place] [address] [phone]  2. MEDICAID [place]/MEDICAID OF [place]   F/O Payor/Plan Precert #  MEDICAID [place]/MEDICAID OF [place]   Subscriber Subscriber #  [place], Gear [id number]  Address Phone  [address] [address] [place], [place] [address] [phone]

## 2017-11-06 NOTE — Care Management (Signed)
Plan for DC back to [facility] with hospice services. CM spoke with son Chanetta Marshall) who would like to use [facility]. Referral routed to [facility] and receipt verified.

## 2017-11-06 NOTE — Consult Note (Addendum)
Consultation Note Date: 11/02/2017   Patient Name: [name]  DOB: 11/02/1920  MRN: [id number]  Age / Sex: 84 y.o., female  PCP: Virgie Dad, MD Referring Physician: Kathie Dike, MD  Reason for Consultation: Establishing goals of care, Hospice Evaluation and Terminal Care  HPI/Patient Profile: 84 y.o. female  with past medical history of atrial fibrillation, HTN, CAD, carotid artery occlusion, PVD, h/o MI, acid reflux admitted from long term care at [facility] on 11/04/2017 with slurred speech r/t right MCA infarct. Family opted out of MRI and further stroke work up.   Clinical Assessment and Goals of Care: I met today at [name]'s bedside with her son and granddaughter. [name] did not awaken for my visit but she seems to alert and interactive and per RN she is very mentally sharp. Still only eating a couple bites or sips now and then. Family tells me how she has been at [facility] for 2 years now. She did very well and lived independently until going to [facility]. They are concerned with her declining QOL with not being able to move/utilize her left side as a result of her stroke.   I spoke privately with son and granddaughter outside room. They are aware of poor prognosis especially with her not eating. We discussed goal of focusing on comfort. We discussed options to assist with this care including hospice facility and hospice at SNF. Son shares that she is well loved and cared for at [facility] and that this is her home and would like for her to return. He shares that staff have been calling and visiting and telling them that they want to take her back to continue caring for her. There is no question that this is their preference. They understand that she is likely to continue to decline and discussed signs at EOL. All questions/concerns addressed.   Late entry: I came back to visit with  [name] daughter at bedside. She shares that her brother has spoken with her and updated her to plan. She agrees. At this time she has no further questions/concerns and is awaiting for transition to return to [facility] with hospice in place. I gave her Hard Choices and Gone From My Sight booklets.   Emotional support provided.   Primary Decision Maker NEXT OF KIN son and daughter    SUMMARY OF RECOMMENDATIONS   - Return to [facility] with hospice for EOL care  Code Status/Advance Care Planning:  DNR   Symptom Management:   No current symptoms.  Poor intake: Stroke related. No interventions. Offer comfort feeds per SLP recs.   Palliative Prophylaxis:   Aspiration, Bowel Regimen, Delirium Protocol, Frequent Pain Assessment, Oral Care and Turn Reposition  Additional Recommendations (Limitations, Scope, Preferences):  Full Comfort Care  Psycho-social/Spiritual:   Desire for further Chaplaincy support:yes  Additional Recommendations: Education on Hospice and Grief/Bereavement Support  Prognosis:   < 2 weeks  Discharge Planning: [place] with Hospice      Primary Diagnoses:  Present on Admission: . Acute CVA (cerebrovascular accident) ([place]) . Acute left hemiparesis ([place]) . Dysphonia . HTN (hypertension) . CAD (coronary artery disease) . PAOD (peripheral arterial occlusive disease) ([place]) . Hyperlipidemia . Heart murmur . Hypothyroidism . Chronic renal insufficiency, stage 3 (moderate) (HCC) . Paroxysmal atrial fibrillation (HCC)   I have reviewed the medical record, interviewed the patient and family, and examined the patient. The following aspects are pertinent.  Past Medical History:  Diagnosis Date  . Acid reflux   . Carotid artery occlusion    minimal  . Coronary artery disease   . Hypercholesteremia   . Hypertension   . Myocardial infarct ([place])   . PAF (paroxysmal atrial fibrillation) ([place])   . Skin disorder   . Systolic  murmur   . Thyroid disease    Social History   Socioeconomic History  . Marital status: Widowed    Spouse name: Not on file  . Number of children: Not on file  . Years of education: Not on file  . Highest education level: Not on file  Occupational History  . Not on file  Social Needs  . Financial resource strain: Not hard at all  . Food insecurity:    Worry: Never true    Inability: Never true  . Transportation needs:    Medical: No    Non-medical: No  Tobacco Use  . Smoking status: Never Smoker  . Smokeless tobacco: Never Used  Substance and Sexual Activity  . Alcohol use: No    Alcohol/week: 0.0 standard drinks  . Drug use: No  . Sexual activity: Never  Lifestyle  . Physical activity:    Days per week: 0 days    Minutes per session: 0 min  . Stress: Not at all  Relationships  . Social connections:    Talks on phone: Twice a week    Gets together: Twice a week    Attends religious service: Never    Active member of club or organization: No    Attends meetings of clubs or organizations: Never    Relationship status: Widowed  Other Topics Concern  . Not on file  Social History Narrative  . Not on file   Family History  Family history unknown: Yes   Scheduled Meds: Continuous Infusions: . sodium chloride 50 mL/hr at 11/23/2017 0203   PRN Meds:.acetaminophen **OR** acetaminophen, antiseptic oral rinse, bisacodyl, glycopyrrolate **OR** glycopyrrolate **OR** glycopyrrolate, LORazepam **OR** LORazepam **OR** LORazepam, morphine injection, morphine CONCENTRATE **OR** morphine CONCENTRATE, ondansetron **OR** ondansetron (ZOFRAN) IV, polyvinyl alcohol No Known Allergies Review of Systems  Unable to perform ROS: Acuity of condition    Physical Exam  Constitutional: She appears well-developed. She appears lethargic. She appears ill.  Frail, elderly although appears younger than stated age  HENT:  Head: Normocephalic and atraumatic.  Cardiovascular: Normal rate. An  irregularly irregular rhythm present.  Pulmonary/Chest: Effort normal. No accessory muscle usage. No tachypnea. No respiratory distress.  Abdominal: Normal appearance.  Neurological: She appears lethargic.  Did not awaken for me to fully assess  Nursing note and vitals reviewed.   Vital Signs: BP 128/84 (BP Location: Left Arm)   Pulse 91   Temp 98.2 F (36.8 C) (Oral)   Resp 18   Ht '5\' 5"'$  (1.651 m)   Wt 60 kg   SpO2 96%   BMI 22.02 kg/m  Pain Scale: 0-10   Pain Score: Asleep   SpO2: SpO2: 96 % O2 Device:SpO2: 96 % O2 Flow Rate: .   IO:  Intake/output summary:   Intake/Output Summary (Last 24 hours) at 11/30/2017 1111 Last data filed at 11/29/2017 0900 Gross per 24 hour  Intake 773.3 ml  Output 700 ml  Net 73.3 ml    LBM: Last BM Date: 11/03/17 Baseline Weight: Weight: 60 kg Most recent weight: Weight: 60 kg     Palliative Assessment/Data: 20%     Time In/Out: 1010-1100, 1330-1350 Time Total: 70 min Greater than 50%  of this time was spent counseling and coordinating care related to the above assessment and plan.  Signed by: Vinie Sill, NP Palliative Medicine Team Pager # [phone] (M-F 8a-5p) Team Phone # [phone] (Nights/Weekends)

## 2017-11-06 NOTE — Progress Notes (Signed)
This is an acute visit.  Level care skilled.  Facility is [facility].  Chief complaint-acute visit status post hospitalization for CVA.  With resulting left-sided hemiparalysis.  History of present illness.  Patient is a pleasant 84 year old female who is a long-term resident of the facility. 2 days ago she was found to have an acute change with left-sided hemiparalysis- upon hospital evaluation was found to have a large right MCA distribution infarction.  She was evaluated by neurology and thought not to be a candidate for TPA or aggressive neurologic intervention.  Transferred to [facility] from [facility] for inpatient neurology consultation and MRI was offered to patient and family-but family declined and preferred a comfort care approach.  She was also seen by palliative care services in the hospital.  She remains somewhat somnolent but was responsive and p.o. intake was diminished- plan was to discharge her back here to skilled nursing with hospice services to follow her for end-of-life care.  She does have orders for dysphasia 1 diet with nectar thick liquids.   does have a diagnosis of atrial fibrillation medications were made including discontinuing her amiodarone she continues on metoprolol and aspirin.  She continues on Synthroid with a history of hypothyroidism as well.  For comfort measures she does have a PRN Roxanol order as well as PRN Ativan and as needed Tylenol.  Of note her Lasix also has been discontinued as well as her statin.  In addition to Norvasc and Zantac  Currently she appears to be comfortable she does speak and recognize people her speech is slow and she is somewhat somnolent but is responsive.  Past Medical History:  Diagnosis Date  . Acid reflux   . Carotid artery occlusion    minimal  . Coronary artery disease   . Hypercholesteremia   . Hypertension   . Myocardial infarct (HCC)   . PAF (paroxysmal atrial  fibrillation) (HCC)   . Skin disorder   . Systolic murmur   . Thyroid disease         Patient Active Problem List   Diagnosis Date Noted  . Paroxysmal atrial fibrillation (HCC) 04/06/2016  . Bilateral leg edema 04/06/2016  . Chronic renal insufficiency, stage 3 (moderate) (HCC) 04/06/2016  . Hypothyroidism 04/06/2016  . History of angioplasty of peripheral vessel 03/28/2016  . Status post amputation of great toe, right (HCC) 03/28/2016  . PAOD (peripheral arterial occlusive disease) (HCC) 02/01/2016  . CAD (coronary artery disease) 03/26/2013  . HTN (hypertension) 03/26/2013  . Hyperlipidemia 03/26/2013  . Heart murmur 03/26/2013         Past Surgical History:  Procedure Laterality Date  . ABDOMINAL HYSTERECTOMY    . AMPUTATION Right 01/05/2016   Procedure: RIGHT GREAT TOE AMPUTATION;  Surgeon: Nada Libman, MD;  Location: [facility] OR;  Service: Vascular;  Laterality: Right;  . CARDIAC CATHETERIZATION    . CARDIOVERSION  2012  . IR GENERIC HISTORICAL  12/30/2015   IR ANGIOGRAM EXTREMITY BILATERAL 12/30/2015 [facility]  . IR GENERIC HISTORICAL  12/30/2015   IR ANGIOGRAM SELECTIVE EACH ADDITIONAL VESSEL 12/30/2015 [facility]  . LOWER EXTREMITY ANGIOGRAM Right 12/30/2015   Procedure: RIGHT LOWER EXTREMITY ANGIOGRAM;  Surgeon: Ancil Linsey, MD;  Location: [facility] ORS;  Service: Vascular;  Laterality: Right;  accessed through left groin area - dressed with two 2x2 and 4x4 opsite  . PERIPHERAL VASCULAR CATHETERIZATION N/A 01/04/2016   Procedure: Lower Extremity Angiography;  Surgeon: Nada Libman, MD;  Location: [facility]  INVASIVE CV LAB;  Service: Cardiovascular;  Laterality: N/A;  . PERIPHERAL VASCULAR CATHETERIZATION Right 01/04/2016   Procedure: Peripheral Vascular Atherectomy;  Surgeon: Nada Libman, MD;  Location: [facility] INVASIVE CV LAB;  Service: Cardiovascular;  Laterality: Right;  Peroneal  . PERIPHERAL VASCULAR CATHETERIZATION N/A 02/01/2016    Procedure: Abdominal Aortogram w/Lower Extremity;  Surgeon: Nada Libman, MD;  Location: [facility] INVASIVE CV LAB;  Service: Cardiovascular;  Laterality: N/A;  . PERIPHERAL VASCULAR CATHETERIZATION  02/01/2016   Procedure: Peripheral Vascular Atherectomy;  Surgeon: Nada Libman, MD;  Location: [facility] INVASIVE CV LAB;  Service: Cardiovascular;;  Left   . THYROID SURGERY                  OB History   None    MEDICATIONS  acetaminophen 325 MG tablet Commonly known as:  TYLENOL Take 650 mg by mouth every 4 (four) hours as needed.   aspirin EC 81 MG tablet Take 1 tablet (81 mg total) by mouth daily.   bisacodyl 10 MG suppository Commonly known as:  DULCOLAX Place 1 suppository (10 mg total) rectally daily as needed for moderate constipation.   glycopyrrolate 1 MG tablet Commonly known as:  ROBINUL Take 1 tablet (1 mg total) by mouth every 4 (four) hours as needed (excessive secretions).   levothyroxine 100 MCG tablet Commonly known as:  SYNTHROID, LEVOTHROID Take 100 mcg by mouth daily before breakfast.   LORazepam 2 MG/ML concentrated solution Commonly known as:  ATIVAN Place 0.5 mLs (1 mg total) under the tongue every 4 (four) hours as needed for anxiety.   metoprolol succinate 25 MG 24 hr tablet Commonly known as:  TOPROL-XL Take 0.5 tablets (12.5 mg total) by mouth daily.   morphine CONCENTRATE 10 MG/0.5ML Soln concentrated solution Place 0.25 mLs (5 mg total) under the tongue every 2 (two) hours as needed for moderate pain (or dyspnea).   ondansetron 4 MG disintegrating tablet Commonly known as:  ZOFRAN-ODT Take 1 tablet (4 mg total) by mouth every 6 (six) hours as needed for nausea.   senna 8.6 MG Tabs tablet Commonly known as:  SENOKOT Take 2 tablets by mouth daily.      No Known Allergies  Consultations:  Palliative care  Review of systems  -essentially unobtainable secondary patient speaking minimally- when asked she says she is not  having pain however does not complain of shortness of breath or discomfort  Physical exam.  Temperature is 97.3 pulse 92 respirations 18- blood pressure manually 150/78- O2 saturation is 96% on room air  In general this is a somewhat frail-appearing elderly female in no distress she is somewhat somnolent but responsive.  Her skin is warm and dry.  Eyes visual acuity appears to be grossly intact she does hold her eyes shut most of the time but does open them when asked--sclera and conjunctive are clear.  Oropharynx she does open her mouth and moves her tongue to some extent mucous membranes appear somewhat dry.  Chest is clear to auscultation with somewhat poor respiratory effort there is no labored breathing.  Heart is irregular irregular rate and rhythm-she does not have significant lower extremity edema.  Abdomen is soft nontender with positive bowel sounds.  Musculoskeletal does have left-sided dense hemiparesis upper and lower extremities does move her right upper and lower extremities but has general frailty and weakness.  She does have a mouth droop.  Neurologic as noted above she does speak but somewhat minimally she does recognize certain people.  Psych as noted above.  Labs.  November 04, 2017.  Sodium 144 potassium 4.2 BUN 23 creatinine 1.2.  Hemoglobin 14.6.  Liver function test within normal limits.  October 31, 2017.  TSH 0.969.  Assessment and plan.  1.-  CVA with left-sided hemiparalysis- again there were extensive discussions with family about aggressiveness of care in the hospital-family has opted for comfort care and transferred back to skilled nursing- she is on aspirin low-dose-does have PRN Roxanol every 2 hours as needed for pain as well as PRN Tylenol and as needed Ativan for anxiety and discomfort.  At this point appears to be comfortable- hospice consult is pending.  2.  Atrial fibrillation she continues on metoprolol as well as  aspirin-amiodarone was discontinued in the hospital.  3.  Hypertension-systolic is mildly elevated tonight again loose control desired secondary to comfort care measures-her Norvasc was discontinued in the hospital at this point will monitor.  4.  History of hypothyroidism for now continues on Synthroid TSH was within normal range in the hospital I suspect hospice may have some input on aggressiveness of continuing this medication.  5.  History of constipation continues on senna.  Again her Lipitor Lasix Norvasc and amiodarone were discontinued in the hospital I suspect more medications may be discontinued but will await hospice input.  Currently she appears comfortable continue to monitor  [id number]-of note greater than 35 minutes spent assessing patient-reviewing her chart and labs- discussing her status with nursing as well as with family at bedside- and coordinating and formulating a plan of care- of note greater than 50% of time spent coordinating a plan of care with input as noted above

## 2017-11-06 NOTE — Discharge Summary (Signed)
Physician Discharge Summary  [name] HYQ:[id number] DOB: 08/09/1920 DOA: 11/04/2017  PCP: Mahlon Gammon, MD  Admit date: 11/04/2017 Discharge date: 13-Nov-2017  Admitted From: Skilled nursing facility Disposition: Skilled nursing facility  Recommendations for Outpatient Follow-up:  1. Patient will be returning to [facility] nursing center with hospice services for end-of-life care  Discharge Condition: Comfort care CODE STATUS: DNR Diet recommendation: Dysphagia 1 diet with nectar thick liquids  Brief/Interim Summary: 84 year old female who is a long-term resident of a nursing facility, who was relatively functional prior to admission, presents to the hospital with acute left hemiparesis.  She is found to have a large right MCA distribution infarction.  She was evaluated by tele neurology and was not felt to be a candidate for TPA or neuro intervention.  Transfer to [facility] for inpatient neurology consultation and MRI was offered to patient and family, but after extensive discussion with tele-neurologist, family elected to pursue a comfort care approach.  Patient was admitted to the hospital where she was seen by palliative care services.  She remains somnolent and p.o. intake has been minimal.  After discussion with palliative care services, plan will be to discharge back to [facility] nursing center with hospice services to follow for end-of-life care.  Anticipate prognosis of days to weeks.  Discharge Diagnoses:  Principal Problem:   Acute CVA (cerebrovascular accident) (HCC) Active Problems:   CAD (coronary artery disease)   HTN (hypertension)   Hyperlipidemia   Heart murmur   PAOD (peripheral arterial occlusive disease) (HCC)   History of angioplasty of peripheral vessel   Status post amputation of great toe, right (HCC)   Paroxysmal atrial fibrillation (HCC)   Chronic renal insufficiency, stage 3 (moderate) (HCC)   Hypothyroidism   Dysphagia   Acute left hemiparesis  (HCC)   Dysphonia    Discharge Instructions  Discharge Instructions    Diet - low sodium heart healthy   Complete by:  As directed    Increase activity slowly   Complete by:  As directed      Allergies as of November 13, 2017   No Known Allergies     Medication List    STOP taking these medications   amiodarone 200 MG tablet Commonly known as:  PACERONE   amLODipine 5 MG tablet Commonly known as:  NORVASC   atorvastatin 20 MG tablet Commonly known as:  LIPITOR   docusate sodium 100 MG capsule Commonly known as:  COLACE   furosemide 20 MG tablet Commonly known as:  LASIX   polyethylene glycol packet Commonly known as:  MIRALAX / GLYCOLAX   ranitidine 150 MG tablet Commonly known as:  ZANTAC     TAKE these medications   acetaminophen 325 MG tablet Commonly known as:  TYLENOL Take 650 mg by mouth every 4 (four) hours as needed.   aspirin EC 81 MG tablet Take 1 tablet (81 mg total) by mouth daily.   bisacodyl 10 MG suppository Commonly known as:  DULCOLAX Place 1 suppository (10 mg total) rectally daily as needed for moderate constipation.   glycopyrrolate 1 MG tablet Commonly known as:  ROBINUL Take 1 tablet (1 mg total) by mouth every 4 (four) hours as needed (excessive secretions).   levothyroxine 100 MCG tablet Commonly known as:  SYNTHROID, LEVOTHROID Take 100 mcg by mouth daily before breakfast.   LORazepam 2 MG/ML concentrated solution Commonly known as:  ATIVAN Place 0.5 mLs (1 mg total) under the tongue every 4 (four) hours as needed for anxiety.  metoprolol succinate 25 MG 24 hr tablet Commonly known as:  TOPROL-XL Take 0.5 tablets (12.5 mg total) by mouth daily.   morphine CONCENTRATE 10 MG/0.5ML Soln concentrated solution Place 0.25 mLs (5 mg total) under the tongue every 2 (two) hours as needed for moderate pain (or dyspnea).   ondansetron 4 MG disintegrating tablet Commonly known as:  ZOFRAN-ODT Take 1 tablet (4 mg total) by mouth every 6  (six) hours as needed for nausea.   senna 8.6 MG Tabs tablet Commonly known as:  SENOKOT Take 2 tablets by mouth daily.       No Known Allergies  Consultations:  Palliative care   Procedures/Studies: Ct Angio Head W Or Wo Contrast  Result Date: 11/04/2017 CLINICAL DATA:  Acute onset of left-sided hemiplegia, left-sided facial droop, and slurred speech. Rightward gaze. Last known well at 7 p.m. last evening. EXAM: CT ANGIOGRAPHY HEAD AND NECK CT PERFUSION BRAIN TECHNIQUE: Multidetector CT imaging of the head and neck was performed using the standard protocol during bolus administration of intravenous contrast. Multiplanar CT image reconstructions and MIPs were obtained to evaluate the vascular anatomy. Carotid stenosis measurements (when applicable) are obtained utilizing NASCET criteria, using the distal internal carotid diameter as the denominator. Multiphase CT imaging of the brain was performed following IV bolus contrast injection. Subsequent parametric perfusion maps were calculated using RAPID software. CONTRAST:  ISOVUE-370 IOPAMIDOL (ISOVUE-370) INJECTION 76% COMPARISON:  CT head without contrast 11/04/2017. FINDINGS: CTA NECK FINDINGS Aortic arch: There is common origin of the left common carotid artery and innominate artery. Atherosclerotic changes are present at the arch and at the origins of the great vessels without significant stenosis. There is no aneurysm. Right carotid system: The right common carotid artery demonstrates distal wall calcifications. There are calcifications at the carotid bifurcation without significant stenosis. Moderate tortuosity is present in the cervical right ICA. There is wall irregularity and beading without focal stenosis or dissection. Left carotid system: The left common carotid artery demonstrates distal mural calcifications. Distal calcifications are in the left common carotid artery distally without significant luminal stenosis. Additional  calcifications are present at the bifurcation significant stenosis. There is moderate tortuosity of the cervical left ICA. Vessel irregularity is present without significant stenosis. Vertebral arteries: Atherosclerotic calcifications are present at the origins of vertebral arteries bilaterally. There is moderate stenosis at the origin of the left vertebral artery, dominant vessel. Marked tortuosity is present both vertebral arteries without significant stenosis otherwise. Skeleton: Multilevel degenerative changes are present in the cervical spine. Uncovertebral spurring is most evident at C5-6, left greater than right. There is foraminal narrowing associated. No focal lytic or blastic lesions are present. The patient is edentulous. Other neck: Patient is status post left thyroidectomy. Heterogeneity of the right lobe is present without a dominant lesion. No focal mucosal or submucosal abnormalities are present otherwise. The subcutaneous 14 mm nodule present just below the right mandible. This is likely a sebaceous cyst. Upper chest: The lung apices are clear.  Thoracic inlet is normal. Review of the MIP images confirms the above findings CTA HEAD FINDINGS Anterior circulation: Dense atherosclerotic calcifications are present throughout the cavernous internal carotid arteries bilaterally. There is no significant stenosis relative to the more distal vessel through the ICA termini. The A1 segments are normal bilaterally. The anterior communicating artery is patent. The left M1 segment is intact. The right M1 segment is occluded distally. The left MCA bifurcation is within normal limits. Left MCA branch vessels are intact. ACA branch vessels are  normal bilaterally. There is marked attenuation right MCA branch vessels distal to the occlusion with minimal collateralization. Posterior circulation: The PICA origins are visualized and normal. Distal vertebral arteries are unremarkable. Vertebrobasilar junction is normal.  Basilar artery is normal. The left posterior cerebral artery originates from the basilar tip. The right posterior cerebral artery is of fetal type. Mild irregularity of PCA branch vessels bilaterally. No significant proximal stenosis or occlusion is present. There is no aneurysm. Venous sinuses: Dural sinuses are patent. Straight sinus deep cerebral veins are intact. Cortical veins are unremarkable. Anatomic variants: None Review of the MIP images confirms the above findings CT Brain Perfusion Findings: CBF (<30%) Volume: 45mL Perfusion (Tmax>6.0s) volume: Mismatch Volume: 97mL Infarction Location:Right MCA territory IMPRESSION: 1. Emergent large vessel occlusion involving the distal right M1 segment. Poor collateralization. 2. Right MCA territory infarct with estimated volume of 45 mL. 3. Larger area of penumbra involving the majority of the right MCA territory, 142 mL. 4. 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 5. Moderate stenosis at the origin of the dominant left vertebral artery. 6. Tortuosity and vessel irregularity of the cervical internal carotid arteries bilaterally suggesting fibromuscular dysplasia. No associated stenosis or dissection. 7. Moderate diffuse small vessel disease throughout the left MCA branch vessels, bilateral ACA branch vessels, and bilateral PCA branch vessels. These results were called by telephone at the time of interpretation on 11/04/2017 at 11:28 am to Dr. Bethann Berkshire , who verbally acknowledged these results. Electronically Signed   By: Marin Roberts M.D.   On: 11/04/2017 11:48   Ct Head Wo Contrast  Result Date: 11/04/2017 CLINICAL DATA:  Per ED note. Pt sent from [facility] due to left side hemiplegia, left sided facial droop, slurred speech,pt gazing To right side. Able to answer some questions.  Last known well 7Pm last night. Per EMS pt is normally able to g.*comment was truncated*Altered level of consciousness (LOC), unexplained EXAM: CT HEAD WITHOUT CONTRAST TECHNIQUE: Contiguous axial images were obtained from the base of the skull through the vertex without intravenous contrast. COMPARISON:  Head CT 04/20/2006 FINDINGS: Brain: No acute intracranial hemorrhage. No focal mass lesion. No CT evidence of acute infarction. No midline shift or mass effect. No hydrocephalus. Basilar cisterns are patent. Extensive periventricular white matter hypodensities are similar to but advanced from CT 2008. Cortical atrophy noted. No hydrocephalus. Vascular: No hyperdense vessel or unexpected calcification. Skull: Normal. Negative for fracture or focal lesion. Sinuses/Orbits: Paranasal sinuses and mastoid air cells are clear. Orbits are clear. Other: None. IMPRESSION: 1. No acute intracranial findings. 2. Extensive periventricular white matter hypodensities progressed from 2008 most suggestive of small vessel ischemia. 3. Atrophy. 4. No hydrocephalus. Electronically Signed   By: Genevive Bi M.D.   On: 11/04/2017 09:38   Ct Angio Neck W And/or Wo Contrast  Result Date: 11/04/2017 CLINICAL DATA:  Acute onset of left-sided hemiplegia, left-sided facial droop, and slurred speech. Rightward gaze. Last known well at 7 p.m. last evening. EXAM: CT ANGIOGRAPHY HEAD AND NECK CT PERFUSION BRAIN TECHNIQUE: Multidetector CT imaging of the head and neck was performed using the standard protocol during bolus administration of intravenous contrast. Multiplanar CT image reconstructions and MIPs were obtained to evaluate the vascular anatomy. Carotid stenosis measurements (when applicable) are obtained utilizing NASCET criteria, using the distal internal carotid diameter as the denominator. Multiphase CT imaging of the brain was performed following  IV bolus contrast injection. Subsequent parametric perfusion maps were calculated  using RAPID software. CONTRAST:  ISOVUE-370 IOPAMIDOL (ISOVUE-370) INJECTION 76% COMPARISON:  CT head without contrast 11/04/2017. FINDINGS: CTA NECK FINDINGS Aortic arch: There is common origin of the left common carotid artery and innominate artery. Atherosclerotic changes are present at the arch and at the origins of the great vessels without significant stenosis. There is no aneurysm. Right carotid system: The right common carotid artery demonstrates distal wall calcifications. There are calcifications at the carotid bifurcation without significant stenosis. Moderate tortuosity is present in the cervical right ICA. There is wall irregularity and beading without focal stenosis or dissection. Left carotid system: The left common carotid artery demonstrates distal mural calcifications. Distal calcifications are in the left common carotid artery distally without significant luminal stenosis. Additional calcifications are present at the bifurcation significant stenosis. There is moderate tortuosity of the cervical left ICA. Vessel irregularity is present without significant stenosis. Vertebral arteries: Atherosclerotic calcifications are present at the origins of vertebral arteries bilaterally. There is moderate stenosis at the origin of the left vertebral artery, dominant vessel. Marked tortuosity is present both vertebral arteries without significant stenosis otherwise. Skeleton: Multilevel degenerative changes are present in the cervical spine. Uncovertebral spurring is most evident at C5-6, left greater than right. There is foraminal narrowing associated. No focal lytic or blastic lesions are present. The patient is edentulous. Other neck: Patient is status post left thyroidectomy. Heterogeneity of the right lobe is present without a dominant lesion. No focal mucosal or submucosal abnormalities are present otherwise. The subcutaneous 14 mm nodule present just below the right mandible. This is likely a  sebaceous cyst. Upper chest: The lung apices are clear.  Thoracic inlet is normal. Review of the MIP images confirms the above findings CTA HEAD FINDINGS Anterior circulation: Dense atherosclerotic calcifications are present throughout the cavernous internal carotid arteries bilaterally. There is no significant stenosis relative to the more distal vessel through the ICA termini. The A1 segments are normal bilaterally. The anterior communicating artery is patent. The left M1 segment is intact. The right M1 segment is occluded distally. The left MCA bifurcation is within normal limits. Left MCA branch vessels are intact. ACA branch vessels are normal bilaterally. There is marked attenuation right MCA branch vessels distal to the occlusion with minimal collateralization. Posterior circulation: The PICA origins are visualized and normal. Distal vertebral arteries are unremarkable. Vertebrobasilar junction is normal. Basilar artery is normal. The left posterior cerebral artery originates from the basilar tip. The right posterior cerebral artery is of fetal type. Mild irregularity of PCA branch vessels bilaterally. No significant proximal stenosis or occlusion is present. There is no aneurysm. Venous sinuses: Dural sinuses are patent. Straight sinus deep cerebral veins are intact. Cortical veins are unremarkable. Anatomic variants: None Review of the MIP images confirms the above findings CT Brain Perfusion Findings: CBF (<30%) Volume: 45mL Perfusion (Tmax>6.0s) volume: Mismatch Volume: 97mL Infarction Location:Right MCA territory IMPRESSION: 1. Emergent large vessel occlusion involving the distal right M1 segment. Poor collateralization. 2. Right MCA territory infarct with estimated volume of 45 mL. 3. Larger area of penumbra involving the majority of the right MCA territory, 142 mL. 4. Atherosclerotic disease involving the origin of the innominate artery, the right carotid bifurcation, and most particularly within  the cavernous right internal carotid artery without significant stenosis. 5. Moderate stenosis at the origin of the dominant left vertebral artery. 6. Tortuosity and vessel irregularity of the cervical internal carotid arteries bilaterally suggesting fibromuscular  dysplasia. No associated stenosis or dissection. 7. Moderate diffuse small vessel disease throughout the left MCA branch vessels, bilateral ACA branch vessels, and bilateral PCA branch vessels. These results were called by telephone at the time of interpretation on 11/04/2017 at 11:28 am to Dr. Bethann Berkshire , who verbally acknowledged these results. Electronically Signed   By: Marin Roberts M.D.   On: 11/04/2017 11:48   Ct Cerebral Perfusion W Contrast  Result Date: 11/04/2017 CLINICAL DATA:  Acute onset of left-sided hemiplegia, left-sided facial droop, and slurred speech. Rightward gaze. Last known well at 7 p.m. last evening. EXAM: CT ANGIOGRAPHY HEAD AND NECK CT PERFUSION BRAIN TECHNIQUE: Multidetector CT imaging of the head and neck was performed using the standard protocol during bolus administration of intravenous contrast. Multiplanar CT image reconstructions and MIPs were obtained to evaluate the vascular anatomy. Carotid stenosis measurements (when applicable) are obtained utilizing NASCET criteria, using the distal internal carotid diameter as the denominator. Multiphase CT imaging of the brain was performed following IV bolus contrast injection. Subsequent parametric perfusion maps were calculated using RAPID software. CONTRAST:  ISOVUE-370 IOPAMIDOL (ISOVUE-370) INJECTION 76% COMPARISON:  CT head without contrast 11/04/2017. FINDINGS: CTA NECK FINDINGS Aortic arch: There is common origin of the left common carotid artery and innominate artery. Atherosclerotic changes are present at the arch and at the origins of the great vessels without significant stenosis. There is no aneurysm. Right carotid system: The right common carotid  artery demonstrates distal wall calcifications. There are calcifications at the carotid bifurcation without significant stenosis. Moderate tortuosity is present in the cervical right ICA. There is wall irregularity and beading without focal stenosis or dissection. Left carotid system: The left common carotid artery demonstrates distal mural calcifications. Distal calcifications are in the left common carotid artery distally without significant luminal stenosis. Additional calcifications are present at the bifurcation significant stenosis. There is moderate tortuosity of the cervical left ICA. Vessel irregularity is present without significant stenosis. Vertebral arteries: Atherosclerotic calcifications are present at the origins of vertebral arteries bilaterally. There is moderate stenosis at the origin of the left vertebral artery, dominant vessel. Marked tortuosity is present both vertebral arteries without significant stenosis otherwise. Skeleton: Multilevel degenerative changes are present in the cervical spine. Uncovertebral spurring is most evident at C5-6, left greater than right. There is foraminal narrowing associated. No focal lytic or blastic lesions are present. The patient is edentulous. Other neck: Patient is status post left thyroidectomy. Heterogeneity of the right lobe is present without a dominant lesion. No focal mucosal or submucosal abnormalities are present otherwise. The subcutaneous 14 mm nodule present just below the right mandible. This is likely a sebaceous cyst. Upper chest: The lung apices are clear.  Thoracic inlet is normal. Review of the MIP images confirms the above findings CTA HEAD FINDINGS Anterior circulation: Dense atherosclerotic calcifications are present throughout the cavernous internal carotid arteries bilaterally. There is no significant stenosis relative to the more distal vessel through the ICA termini. The A1 segments are normal bilaterally. The anterior communicating  artery is patent. The left M1 segment is intact. The right M1 segment is occluded distally. The left MCA bifurcation is within normal limits. Left MCA branch vessels are intact. ACA branch vessels are normal bilaterally. There is marked attenuation right MCA branch vessels distal to the occlusion with minimal collateralization. Posterior circulation: The PICA origins are visualized and normal. Distal vertebral arteries are unremarkable. Vertebrobasilar junction is normal. Basilar artery is normal. The left posterior cerebral artery originates from  the basilar tip. The right posterior cerebral artery is of fetal type. Mild irregularity of PCA branch vessels bilaterally. No significant proximal stenosis or occlusion is present. There is no aneurysm. Venous sinuses: Dural sinuses are patent. Straight sinus deep cerebral veins are intact. Cortical veins are unremarkable. Anatomic variants: None Review of the MIP images confirms the above findings CT Brain Perfusion Findings: CBF (<30%) Volume: 45mL Perfusion (Tmax>6.0s) volume: Mismatch Volume: 97mL Infarction Location:Right MCA territory IMPRESSION: 1. Emergent large vessel occlusion involving the distal right M1 segment. Poor collateralization. 2. Right MCA territory infarct with estimated volume of 45 mL. 3. Larger area of penumbra involving the majority of the right MCA territory, 142 mL. 4. 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 5. Moderate stenosis at the origin of the dominant left vertebral artery. 6. Tortuosity and vessel irregularity of the cervical internal carotid arteries bilaterally suggesting fibromuscular dysplasia. No associated stenosis or dissection. 7. Moderate diffuse small vessel disease throughout the left MCA branch vessels, bilateral ACA branch vessels, and bilateral PCA  branch vessels. These results were called by telephone at the time of interpretation on 11/04/2017 at 11:28 am to Dr. Bethann Berkshire , who verbally acknowledged these results. Electronically Signed   By: Marin Roberts M.D.   On: 11/04/2017 11:48   Dg Chest Port 1 View  Result Date: 10/31/2017 CLINICAL DATA:  Sudden onset dizziness and weakness starting at 1500 hours. EXAM: PORTABLE CHEST 1 VIEW COMPARISON:  12/27/2015 FINDINGS: Stable cardiomegaly with moderate aortic atherosclerosis. Slight hyperinflation lungs without pulmonary consolidation, effusion or pneumothorax. Slight pulmonary vascular congestion is redemonstrated. Surgical clips project over the base of neck and superior mediastinum as before. Osteoarthritis of the included AC and glenohumeral joints slight high-riding of the right humeral head. IMPRESSION: Stable cardiomegaly with aortic atherosclerosis. Mild pulmonary vascular congestion. Electronically Signed   By: Tollie Eth M.D.   On: 10/31/2017 16:30       Subjective: Patient is somnolent, responsive to voice, but does not open her eyes.  Denies any pain.  Discharge Exam: Vitals:   11/05/17 0955 11/05/17 1554 11/05/17 2217 11-09-2017 0607  BP:  (!) 143/66 128/84   Pulse:  (!) 106 82 91  Resp:  18 18   Temp:  97.7 F (36.5 C) 97.8 F (36.6 C) 98.2 F (36.8 C)  TempSrc:  Oral Oral Oral  SpO2: 99% 98% 96% 96%  Weight:      Height:        General: Pt is somnolent, appears comfortable Cardiovascular: Irregular rate and rhythm Respiratory: CTA bilaterally, no wheezing, no rhonchi Abdominal: Soft, NT, ND, bowel sounds + Extremities: no edema, no cyanosis    The results of significant diagnostics from this hospitalization (including imaging, microbiology, ancillary and laboratory) are listed below for reference.     Microbiology: Recent Results (from the past 240 hour(s))  Urine Culture     Status: Abnormal   Collection Time: 10/31/17  4:07 PM  Result Value  Ref Range Status   Specimen Description   Final    URINE, CATHETERIZED Performed at [facility], [address] [place], [place] [address]    Special Requests   Final    NONE Performed at [facility], [address], [place], [place] [address]    Culture (A)  Final    <10,000 COLONIES/mL INSIGNIFICANT GROWTH Performed at [facility], [address], [place], [place] [address]    Report Status 11/02/2017 FINAL  Final  MRSA PCR Screening     Status: None   Collection Time: 11/04/17  3:29 PM  Result Value Ref Range Status   MRSA by PCR NEGATIVE NEGATIVE Final    Comment:        The GeneXpert MRSA Assay (FDA approved for NASAL specimens only), is one component of a comprehensive MRSA colonization surveillance program. It is not intended to diagnose MRSA infection nor to guide or monitor treatment for MRSA infections. Performed at [facility], [address], [place], [place] [address]      Labs: BNP (last 3 results) No results for input(s): BNP in the last 8760 hours. Basic Metabolic Panel: Recent Labs  Lab 10/31/17 1608 11/04/17 0752 11/04/17 0757  NA 141 142 144  K 3.7 4.0 4.2  CL 103 107 106  CO2 26 27  --   GLUCOSE 114* 95 92  BUN 30* 20 23  CREATININE 1.22* 1.07* 1.20*  CALCIUM 9.2 9.0  --   MG 2.4  --   --    Liver Function Tests: Recent Labs  Lab 10/31/17 1608 11/04/17 0752  AST 23 20  ALT 19 16  ALKPHOS 116 101  BILITOT 0.6 0.7  PROT 7.9 7.5  ALBUMIN 4.1 3.8   No results for input(s): LIPASE, AMYLASE in the last 168 hours. No results for input(s): AMMONIA in the last 168 hours. CBC: Recent Labs  Lab 10/31/17 1608 11/04/17 0752 11/04/17 0757  WBC 5.1 5.9  --   NEUTROABS 3.4 3.7  --   HGB 16.0* 14.1 14.6  HCT 50.4* 44.4 43.0  MCV 94.7 95.9  --   PLT 331 260  --    Cardiac Enzymes: Recent Labs  Lab 10/31/17 1608  TROPONINI <0.03   BNP: Invalid input(s): POCBNP CBG: No results for input(s): GLUCAP in the last 168  hours. D-Dimer No results for input(s): DDIMER in the last 72 hours. Hgb A1c No results for input(s): HGBA1C in the last 72 hours. Lipid Profile No results for input(s): CHOL, HDL, LDLCALC, TRIG, CHOLHDL, LDLDIRECT in the last 72 hours. Thyroid function studies No results for input(s): TSH, T4TOTAL, T3FREE, THYROIDAB in the last 72 hours.  Invalid input(s): FREET3 Anemia work up No results for input(s): VITAMINB12, FOLATE, FERRITIN, TIBC, IRON, RETICCTPCT in the last 72 hours. Urinalysis    Component Value Date/Time   COLORURINE YELLOW 10/31/2017 1607   APPEARANCEUR CLEAR 10/31/2017 1607   LABSPEC 1.014 10/31/2017 1607   PHURINE 7.0 10/31/2017 1607   GLUCOSEU NEGATIVE 10/31/2017 1607   HGBUR NEGATIVE 10/31/2017 1607   BILIRUBINUR NEGATIVE 10/31/2017 1607   KETONESUR NEGATIVE 10/31/2017 1607   PROTEINUR NEGATIVE 10/31/2017 1607   NITRITE NEGATIVE 10/31/2017 1607   LEUKOCYTESUR MODERATE (A) 10/31/2017 1607   Sepsis Labs Invalid input(s): PROCALCITONIN,  WBC,  LACTICIDVEN Microbiology Recent Results (from the past 240 hour(s))  Urine Culture     Status: Abnormal   Collection Time: 10/31/17  4:07 PM  Result Value Ref Range Status   Specimen Description   Final    URINE, CATHETERIZED Performed at [facility], [address], [place], [place] [address]    Special Requests   Final    NONE Performed at [facility], [address] [place], [place] [address]    Culture (A)  Final    <10,000 COLONIES/mL INSIGNIFICANT GROWTH Performed at [facility], [address] [place], [place] [address]  Report Status 11/02/2017 FINAL  Final  MRSA PCR Screening     Status: None   Collection Time: 11/04/17  3:29 PM  Result Value Ref Range Status   MRSA by PCR NEGATIVE NEGATIVE Final    Comment:        The GeneXpert MRSA Assay (FDA approved for NASAL specimens only), is one component of a comprehensive MRSA colonization surveillance program. It is not intended to diagnose  MRSA infection nor to guide or monitor treatment for MRSA infections. Performed at [facility], [address] [place], [place] [address]      Time coordinating discharge:  SIGNED:   Erick Blinks, MD  [facility] 11/02/2017, 12:42 PM Pager   If 7PM-7AM, please contact night-coverage [email or web address] Password [email or web address]

## 2017-11-06 NOTE — Progress Notes (Signed)
Patient stable for discharge and left floor via bed for discharge to [facility]. Family at bedside for discharge. Earnstine Regal, RN

## 2017-11-08 ENCOUNTER — Encounter: Payer: Self-pay | Admitting: Internal Medicine

## 2017-11-08 ENCOUNTER — Non-Acute Institutional Stay (SKILLED_NURSING_FACILITY): Payer: Medicare Other | Admitting: Internal Medicine

## 2017-11-08 DIAGNOSIS — I639 Cerebral infarction, unspecified: Secondary | ICD-10-CM | POA: Diagnosis not present

## 2017-11-08 DIAGNOSIS — I2510 Atherosclerotic heart disease of native coronary artery without angina pectoris: Secondary | ICD-10-CM | POA: Diagnosis not present

## 2017-11-08 DIAGNOSIS — I480 Paroxysmal atrial fibrillation: Secondary | ICD-10-CM | POA: Diagnosis not present

## 2017-11-08 NOTE — Progress Notes (Signed)
Provider:  [facility] Location:    [facility] Room Number: 102/P Place of Service:  SNF ([phone])  PCP: Mahlon Gammon, MD Patient Care Team: Mahlon Gammon, MD as PCP - General (Internal Medicine) Wyline Mood [name], MD as PCP - Cardiology (Cardiology) Synetta Shadow as Physician Assistant (Internal Medicine)  Extended Emergency Contact Information Primary Emergency Contact: Casimer Lanius [place] Home Phone: [phone] Mobile Phone: [phone] Relation: None Secondary Emergency Contact: Margarito Courser [place] Home Phone: [phone] Relation: None  Code Status: DNR Goals of Care: Advanced Directive information Advanced Directives 11/08/2017  Does Patient Have a Medical Advance Directive? Yes  Type of Advance Directive Out of facility DNR (pink MOST or yellow form)  Does patient want to make changes to medical advance directive? No - Patient declined  Copy of Healthcare Power of Attorney in Chart? No - copy requested  Would patient like information on creating a medical advance directive? No - Patient declined  Pre-existing out of facility DNR order (yellow form or pink MOST form) -      Chief Complaint  Patient presents with  . Readmit To SNF    Readmission Visit    HPI: Patient is a 84 y.o. female seen today for admission to SNF for Comfort Care Patient has H/O CAD, Paroxysmal Atrial FibrillationHypertension, Hyperlipidemia., PAD,Right Great Toe Amputation in 01/18 And left Peroneal Atherectomy  She was long term Resident of Facility. She was send to the Hospital for Acute Left Hemiparesis. She was found to Have Right MCA stroke. She was not felt to be Candidate for TPA.Marland Kitchen Her family wanted no Aggressive measures and now she is SNF for Comfort Care. Patient looks like she was sleeping but she responds to questions .She has been drinking water and Eating little anount . She denied any Pain ,  Cough or any other discomfort.    Past Medical History:  Diagnosis Date  . Acid reflux   . Carotid artery occlusion    minimal  . Coronary artery disease   . Hypercholesteremia   . Hypertension   . Myocardial infarct (HCC)   . PAF (paroxysmal atrial fibrillation) (HCC)   . Skin disorder   . Systolic murmur   . Thyroid disease    Past Surgical History:  Procedure Laterality Date  . ABDOMINAL HYSTERECTOMY    . AMPUTATION Right 01/05/2016   Procedure: RIGHT GREAT TOE AMPUTATION;  Surgeon: Nada Libman, MD;  Location: [facility] OR;  Service: Vascular;  Laterality: Right;  . CARDIAC CATHETERIZATION    . CARDIOVERSION  2012  . IR GENERIC HISTORICAL  12/30/2015   IR ANGIOGRAM EXTREMITY BILATERAL 12/30/2015 [facility]  . IR GENERIC HISTORICAL  12/30/2015   IR ANGIOGRAM SELECTIVE EACH ADDITIONAL VESSEL 12/30/2015 [facility]  . LOWER EXTREMITY ANGIOGRAM Right 12/30/2015   Procedure: RIGHT LOWER EXTREMITY ANGIOGRAM;  Surgeon: Ancil Linsey, MD;  Location: [facility];  Service: Vascular;  Laterality: Right;  accessed through left groin area - dressed with two 2x2 and 4x4 opsite  . PERIPHERAL VASCULAR CATHETERIZATION N/A 01/04/2016   Procedure: Lower Extremity Angiography;  Surgeon: Nada Libman, MD;  Location: [facility] INVASIVE CV LAB;  Service: Cardiovascular;  Laterality: N/A;  . PERIPHERAL VASCULAR CATHETERIZATION Right 01/04/2016   Procedure: Peripheral Vascular Atherectomy;  Surgeon: Nada Libman, MD;  Location: [facility] INVASIVE CV LAB;  Service: Cardiovascular;  Laterality: Right;  Peroneal  . PERIPHERAL VASCULAR CATHETERIZATION N/A 02/01/2016   Procedure:  Abdominal Aortogram w/Lower Extremity;  Surgeon: Nada Libman, MD;  Location: [facility] INVASIVE CV LAB;  Service: Cardiovascular;  Laterality: N/A;  . PERIPHERAL VASCULAR CATHETERIZATION  02/01/2016   Procedure: Peripheral Vascular Atherectomy;  Surgeon: Nada Libman, MD;  Location: [facility] INVASIVE CV LAB;  Service: Cardiovascular;;  Left     . THYROID SURGERY      reports that she has never smoked. She has never used smokeless tobacco. She reports that she does not drink alcohol or use drugs. Social History   Socioeconomic History  . Marital status: Widowed    Spouse name: Not on file  . Number of children: Not on file  . Years of education: Not on file  . Highest education level: Not on file  Occupational History  . Not on file  Social Needs  . Financial resource strain: Not hard at all  . Food insecurity:    Worry: Never true    Inability: Never true  . Transportation needs:    Medical: No    Non-medical: No  Tobacco Use  . Smoking status: Never Smoker  . Smokeless tobacco: Never Used  Substance and Sexual Activity  . Alcohol use: No    Alcohol/week: 0.0 standard drinks  . Drug use: No  . Sexual activity: Never  Lifestyle  . Physical activity:    Days per week: 0 days    Minutes per session: 0 min  . Stress: Not at all  Relationships  . Social connections:    Talks on phone: Twice a week    Gets together: Twice a week    Attends religious service: Never    Active member of club or organization: No    Attends meetings of clubs or organizations: Never    Relationship status: Widowed  . Intimate partner violence:    Fear of current or ex partner: No    Emotionally abused: No    Physically abused: No    Forced sexual activity: No  Other Topics Concern  . Not on file  Social History Narrative  . Not on file    Functional Status Survey:    Family History  Family history unknown: Yes    Health Maintenance  Topic Date Due  . DEXA SCAN  11/25/2017 (Originally 02/09/1987)  . TETANUS/TDAP  09/15/2026  . INFLUENZA VACCINE  Completed  . PNA vac Low Risk Adult  Completed    No Known Allergies  Outpatient Encounter Medications as of 11/08/2017  Medication Sig  . acetaminophen (TYLENOL) 325 MG tablet Take 650 mg by mouth every 4 (four) hours as needed.   Marland Kitchen aspirin EC 81 MG tablet Take 1 tablet  (81 mg total) by mouth daily.  . bisacodyl (DULCOLAX) 10 MG suppository Place 1 suppository (10 mg total) rectally daily as needed for moderate constipation.  Marland Kitchen glycopyrrolate (ROBINUL) 1 MG tablet Take 1 tablet (1 mg total) by mouth every 4 (four) hours as needed (excessive secretions).  Marland Kitchen levothyroxine (SYNTHROID, LEVOTHROID) 100 MCG tablet Take 100 mcg by mouth daily before breakfast.  . LORazepam (ATIVAN) 2 MG/ML concentrated solution Place 0.5 mLs (1 mg total) under the tongue every 4 (four) hours as needed for anxiety.  . metoprolol succinate (TOPROL-XL) 25 MG 24 hr tablet Take 0.5 tablets (12.5 mg total) by mouth daily.  . Morphine Sulfate (MORPHINE CONCENTRATE) 10 MG/0.5ML SOLN concentrated solution Place 0.25 mLs (5 mg total) under the tongue every 2 (two) hours as needed for moderate pain (or dyspnea).  . ondansetron (  ZOFRAN-ODT) 4 MG disintegrating tablet Take 1 tablet (4 mg total) by mouth every 6 (six) hours as needed for nausea.  Marland Kitchen senna (SENOKOT) 8.6 MG TABS tablet Take 2 tablets by mouth daily.    No facility-administered encounter medications on file as of 11/08/2017.      Review of Systems  Unable to perform ROS: Other (Patient said she is fine and did not answers other questions)    Vitals:   11/08/17 1031  BP: 116/71  Pulse: 64  Resp: 19  Temp: (!) 96.8 F (36 C)  TempSrc: Oral  SpO2: 94%   There is no height or weight on file to calculate BMI. Physical Exam  Constitutional: She appears well-developed and well-nourished.  HENT:  Head: Normocephalic.  Mouth Thrush  Eyes: Pupils are equal, round, and reactive to light.  Neck: Neck supple.  Cardiovascular: Normal rate. An irregular rhythm present.  No murmur heard. Pulmonary/Chest: Effort normal and breath sounds normal. No stridor. No respiratory distress. She has no wheezes.  Abdominal: Soft. Bowel sounds are normal. She exhibits no distension. There is no tenderness. There is no guarding.  Neurological:   Somnolent but responds to Simple Commands Left Hemiparesis with Left Facial Droop  Skin: Skin is warm and dry.  Psychiatric: She has a normal mood and affect. Her behavior is normal. Thought content normal.    Labs reviewed: Basic Metabolic Panel: Recent Labs    08/08/17 0415 10/31/17 1608 11/04/17 0752 11/04/17 0757  NA 142 141 142 144  K 3.6 3.7 4.0 4.2  CL 108 103 107 106  CO2 27 26 27   --   GLUCOSE 83 114* 95 92  BUN 33* 30* 20 23  CREATININE 1.29* 1.22* 1.07* 1.20*  CALCIUM 8.6* 9.2 9.0  --   MG  --  2.4  --   --    Liver Function Tests: Recent Labs    03/07/17 1450 10/31/17 1608 11/04/17 0752  AST 22 23 20   ALT 15 19 16   ALKPHOS 117 116 101  BILITOT 0.7 0.6 0.7  PROT 7.2 7.9 7.5  ALBUMIN 3.7 4.1 3.8   No results for input(s): LIPASE, AMYLASE in the last 8760 hours. No results for input(s): AMMONIA in the last 8760 hours. CBC: Recent Labs    08/08/17 0415 10/31/17 1608 11/04/17 0752 11/04/17 0757  WBC 4.6 5.1 5.9  --   NEUTROABS 2.5 3.4 3.7  --   HGB 13.4 16.0* 14.1 14.6  HCT 40.3 50.4* 44.4 43.0  MCV 94.2 94.7 95.9  --   PLT 230 331 260  --    Cardiac Enzymes: Recent Labs    10/31/17 1608  TROPONINI <0.03   BNP: Invalid input(s): POCBNP No results found for: HGBA1C Lab Results  Component Value Date   TSH 0.969 10/31/2017   Lab Results  Component Value Date   VITAMINB12 2,512 (H) 05/23/2016   Lab Results  Component Value Date   FOLATE 7.9 01/13/2016   Lab Results  Component Value Date   IRON 23 (L) 01/13/2016   TIBC 196 (L) 01/13/2016   FERRITIN 128 01/13/2016    Imaging and Procedures obtained prior to SNF admission: No results found.  Assessment/Plan Acute Right MCA stroke With Left Hemiparesis Patient is Comfort Care Under Hospice Nystatin for Mouth On Ativan and Roxanol PRN Hospice is following D/W the Son and other Family Members in room Continue On Aspirin and Low dose of Lo[pressor    Family/ staff  Communication:   Labs/tests ordered:  D/W the Family In the Room Total time spent in this patient care encounter was 45_ minutes; greater than 50% of the visit spent counseling patient and Son, reviewing records , Labs and coordinating care for problems addressed at this encounter.

## 2017-11-12 ENCOUNTER — Ambulatory Visit: Payer: Medicare Other | Admitting: Cardiology

## 2017-11-19 ENCOUNTER — Non-Acute Institutional Stay (SKILLED_NURSING_FACILITY): Payer: Medicare Other | Admitting: Internal Medicine

## 2017-11-19 ENCOUNTER — Encounter: Payer: Self-pay | Admitting: Internal Medicine

## 2017-11-19 DIAGNOSIS — I639 Cerebral infarction, unspecified: Secondary | ICD-10-CM | POA: Diagnosis not present

## 2017-11-19 DIAGNOSIS — Z515 Encounter for palliative care: Secondary | ICD-10-CM | POA: Diagnosis not present

## 2017-11-19 DIAGNOSIS — I480 Paroxysmal atrial fibrillation: Secondary | ICD-10-CM | POA: Diagnosis not present

## 2017-11-19 NOTE — Progress Notes (Signed)
Location:    [facility] Room Number: 102/P Place of Service:  SNF [phone]) Provider:  Einar Crow MD  Mahlon Gammon, MD  Patient Care Team: Mahlon Gammon, MD as PCP - General (Internal Medicine) Wyline Mood [name], MD as PCP - Cardiology (Cardiology) Synetta Shadow as Physician Assistant (Internal Medicine)  Extended Emergency Contact Information Primary Emergency Contact: Casimer Lanius [place] Home Phone: [phone] Mobile Phone: [phone] Relation: None Secondary Emergency Contact: Margarito Courser [place] Home Phone: [phone] Relation: None  Code Status:  DNR Goals of care: Advanced Directive information Advanced Directives 11/19/2017  Does Patient Have a Medical Advance Directive? Yes  Type of Advance Directive Out of facility DNR (pink MOST or yellow form)  Does patient want to make changes to medical advance directive? No - Patient declined  Copy of Healthcare Power of Attorney in Chart? No - copy requested  Would patient like information on creating a medical advance directive? No - Patient declined  Pre-existing out of facility DNR order (yellow form or pink MOST form) -     Chief Complaint  Patient presents with  . Acute Visit    F/U Evaluate for therapy    HPI:  Pt is a 84 y.o. female seen today for an acute visit for Follow up and Possible therapy for her Left Hemiparesis.  Patient has H/O CAD, Paroxysmal Atrial FibrillationHypertension, Hyperlipidemia., PAD,Right Great Toe Amputation in 01/18 And left Peroneal Atherectomy  She was long term Resident of Facility. She was send to the Hospital for Acute Left Hemiparesis. She was found to Have Right MCA stroke. She was not felt to be Candidate for TPA.Marland Kitchen Her family wanted no Aggressive measures and now she is SNF for Comfort Care. Patient has made improvement since then. She is more awake and is responding more. She is on Nectar thick  Diet and eating well . Per nurses they are getting her up. She was responding to my questions today. She did not have any acute complains. No Fever or cough or Chest pain.   Past Medical History:  Diagnosis Date  . Acid reflux   . Carotid artery occlusion    minimal  . Coronary artery disease   . Hypercholesteremia   . Hypertension   . Myocardial infarct (HCC)   . PAF (paroxysmal atrial fibrillation) (HCC)   . Skin disorder   . Systolic murmur   . Thyroid disease    Past Surgical History:  Procedure Laterality Date  . ABDOMINAL HYSTERECTOMY    . AMPUTATION Right 01/05/2016   Procedure: RIGHT GREAT TOE AMPUTATION;  Surgeon: Nada Libman, MD;  Location: [facility] OR;  Service: Vascular;  Laterality: Right;  . CARDIAC CATHETERIZATION    . CARDIOVERSION  2012  . IR GENERIC HISTORICAL  12/30/2015   IR ANGIOGRAM EXTREMITY BILATERAL 12/30/2015 [facility]  . IR GENERIC HISTORICAL  12/30/2015   IR ANGIOGRAM SELECTIVE EACH ADDITIONAL VESSEL 12/30/2015 [facility]  . LOWER EXTREMITY ANGIOGRAM Right 12/30/2015   Procedure: RIGHT LOWER EXTREMITY ANGIOGRAM;  Surgeon: Ancil Linsey, MD;  Location: [facility];  Service: Vascular;  Laterality: Right;  accessed through left groin area - dressed with two 2x2 and 4x4 opsite  . PERIPHERAL VASCULAR CATHETERIZATION N/A 01/04/2016   Procedure: Lower Extremity Angiography;  Surgeon: Nada Libman, MD;  Location: [facility] INVASIVE CV LAB;  Service: Cardiovascular;  Laterality: N/A;  . PERIPHERAL VASCULAR CATHETERIZATION Right 01/04/2016   Procedure: Peripheral Vascular Atherectomy;  Surgeon: Nada Libman, MD;  Location: [facility] INVASIVE CV LAB;  Service: Cardiovascular;  Laterality: Right;  Peroneal  . PERIPHERAL VASCULAR CATHETERIZATION N/A 02/01/2016   Procedure: Abdominal Aortogram w/Lower Extremity;  Surgeon: Nada Libman, MD;  Location: [facility] INVASIVE CV LAB;  Service: Cardiovascular;  Laterality: N/A;  . PERIPHERAL VASCULAR CATHETERIZATION  02/01/2016    Procedure: Peripheral Vascular Atherectomy;  Surgeon: Nada Libman, MD;  Location: [facility] INVASIVE CV LAB;  Service: Cardiovascular;;  Left   . THYROID SURGERY      No Known Allergies  Outpatient Encounter Medications as of 11/19/2017  Medication Sig  . acetaminophen (TYLENOL) 325 MG tablet Take 650 mg by mouth every 4 (four) hours as needed.   Marland Kitchen aspirin EC 81 MG tablet Take 1 tablet (81 mg total) by mouth daily.  . bisacodyl (DULCOLAX) 10 MG suppository Place 1 suppository (10 mg total) rectally daily as needed for moderate constipation.  Marland Kitchen glycopyrrolate (ROBINUL) 1 MG tablet Take 1 tablet (1 mg total) by mouth every 4 (four) hours as needed (excessive secretions).  Marland Kitchen levothyroxine (SYNTHROID, LEVOTHROID) 100 MCG tablet Take 100 mcg by mouth daily before breakfast.  . LORazepam (ATIVAN) 2 MG/ML concentrated solution Place 0.5 mLs (1 mg total) under the tongue every 4 (four) hours as needed for anxiety.  . metoprolol succinate (TOPROL-XL) 25 MG 24 hr tablet Take 0.5 tablets (12.5 mg total) by mouth daily.  . ondansetron (ZOFRAN-ODT) 4 MG disintegrating tablet Take 1 tablet (4 mg total) by mouth every 6 (six) hours as needed for nausea.  Marland Kitchen senna (SENOKOT) 8.6 MG TABS tablet Take 2 tablets by mouth daily.   . [DISCONTINUED] Morphine Sulfate (MORPHINE CONCENTRATE) 10 MG/0.5ML SOLN concentrated solution Place 0.25 mLs (5 mg total) under the tongue every 2 (two) hours as needed for moderate pain (or dyspnea).   No facility-administered encounter medications on file as of 11/19/2017.      Review of Systems  Review of Systems  Constitutional: Negative for activity change, appetite change, chills, diaphoresis, fatigue and fever.  HENT: Negative for mouth sores, postnasal drip, rhinorrhea, sinus pain and sore throat.   Respiratory: Negative for apnea, cough, chest tightness, shortness of breath and wheezing.   Cardiovascular: Negative for chest pain, palpitations and leg swelling.    Gastrointestinal: Negative for abdominal distention, abdominal pain, constipation, diarrhea, nausea and vomiting.  Genitourinary: Negative for dysuria and frequency.  Musculoskeletal: Negative for arthralgias, joint swelling and myalgias.  Skin: Negative for rash.  Neurological: Negative for dizziness, syncope, weakness, light-headedness and numbness.  Psychiatric/Behavioral: Negative for behavioral problems, confusion and sleep disturbance.     Immunization History  Administered Date(s) Administered  . Influenza-Unspecified 10/06/2016  . Pneumococcal Conjugate-13 08/24/2016  . Tdap 09/14/2016   Pertinent  Health Maintenance Due  Topic Date Due  . DEXA SCAN  11/25/2017 (Originally 02/09/1987)  . INFLUENZA VACCINE  Completed  . PNA vac Low Risk Adult  Completed   Fall Risk  06/05/2017  Falls in the past year? No   Functional Status Survey:    Vitals:   11/19/17 1928  BP: (!) 151/86  Pulse: 93  Resp: 20  Temp: 98.1 F (36.7 C)   There is no height or weight on file to calculate BMI. Physical Exam  Constitutional: She appears well-developed and well-nourished.  HENT:  Head: Normocephalic.  Mouth/Throat: Oropharynx is clear and moist.  Eyes: Pupils are equal, round, and reactive to light.  Neck: Neck supple.  Cardiovascular: Normal rate. An irregular rhythm present.  No murmur heard. Pulmonary/Chest: Effort normal and breath sounds normal. No stridor. No respiratory distress. She has no wheezes.  Abdominal: Soft. Bowel sounds are normal. She exhibits no distension. There is no tenderness. There is no guarding.  Neurological:  Alert responds to Simple Commands Left Hemiparesis with Left Facial Droop LLE and LUE 0/5  Skin: Skin is warm and dry.  Psychiatric: She has a normal mood and affect. Her behavior is normal. Thought content normal.    Labs reviewed: Recent Labs    08/08/17 0415 10/31/17 1608 11/04/17 0752 11/04/17 0757  NA 142 141 142 144  K 3.6 3.7 4.0  4.2  CL 108 103 107 106  CO2 27 26 27   --   GLUCOSE 83 114* 95 92  BUN 33* 30* 20 23  CREATININE 1.29* 1.22* 1.07* 1.20*  CALCIUM 8.6* 9.2 9.0  --   MG  --  2.4  --   --    Recent Labs    03/07/17 1450 10/31/17 1608 11/04/17 0752  AST 22 23 20   ALT 15 19 16   ALKPHOS 117 116 101  BILITOT 0.7 0.6 0.7  PROT 7.2 7.9 7.5  ALBUMIN 3.7 4.1 3.8   Recent Labs    08/08/17 0415 10/31/17 1608 11/04/17 0752 11/04/17 0757  WBC 4.6 5.1 5.9  --   NEUTROABS 2.5 3.4 3.7  --   HGB 13.4 16.0* 14.1 14.6  HCT 40.3 50.4* 44.4 43.0  MCV 94.2 94.7 95.9  --   PLT 230 331 260  --    Lab Results  Component Value Date   TSH 0.969 10/31/2017   No results found for: HGBA1C Lab Results  Component Value Date   CHOL 145 09/19/2016   HDL 41 09/19/2016   LDLCALC 81 09/19/2016   TRIG 116 09/19/2016   CHOLHDL 3.5 09/19/2016    Significant Diagnostic Results in last 30 days:  Ct Angio Head W Or Wo Contrast  Result Date: 11/04/2017 CLINICAL DATA:  Acute onset of left-sided hemiplegia, left-sided facial droop, and slurred speech. Rightward gaze. Last known well at 7 p.m. last evening. EXAM: CT ANGIOGRAPHY HEAD AND NECK CT PERFUSION BRAIN TECHNIQUE: Multidetector CT imaging of the head and neck was performed using the standard protocol during bolus administration of intravenous contrast. Multiplanar CT image reconstructions and MIPs were obtained to evaluate the vascular anatomy. Carotid stenosis measurements (when applicable) are obtained utilizing NASCET criteria, using the distal internal carotid diameter as the denominator. Multiphase CT imaging of the brain was performed following IV bolus contrast injection. Subsequent parametric perfusion maps were calculated using RAPID software. CONTRAST:  115mL ISOVUE-370 IOPAMIDOL (ISOVUE-370) INJECTION 76% COMPARISON:  CT head without contrast 11/04/2017. FINDINGS: CTA NECK FINDINGS Aortic arch: There is common origin of the left common carotid artery and  innominate artery. Atherosclerotic changes are present at the arch and at the origins of the great vessels without significant stenosis. There is no aneurysm. Right carotid system: The right common carotid artery demonstrates distal wall calcifications. There are calcifications at the carotid bifurcation without significant stenosis. Moderate tortuosity is present in the cervical right ICA. There is wall irregularity and beading without focal stenosis or dissection. Left carotid system: The left common carotid artery demonstrates distal mural calcifications. Distal calcifications are in the left common carotid artery distally without significant luminal stenosis. Additional calcifications are present at the bifurcation significant stenosis. There is moderate tortuosity of the cervical left ICA. Vessel irregularity is present without significant stenosis. Vertebral arteries: Atherosclerotic calcifications are  present at the origins of vertebral arteries bilaterally. There is moderate stenosis at the origin of the left vertebral artery, dominant vessel. Marked tortuosity is present both vertebral arteries without significant stenosis otherwise. Skeleton: Multilevel degenerative changes are present in the cervical spine. Uncovertebral spurring is most evident at C5-6, left greater than right. There is foraminal narrowing associated. No focal lytic or blastic lesions are present. The patient is edentulous. Other neck: Patient is status post left thyroidectomy. Heterogeneity of the right lobe is present without a dominant lesion. No focal mucosal or submucosal abnormalities are present otherwise. The subcutaneous 14 mm nodule present just below the right mandible. This is likely a sebaceous cyst. Upper chest: The lung apices are clear.  Thoracic inlet is normal. Review of the MIP images confirms the above findings CTA HEAD FINDINGS Anterior circulation: Dense atherosclerotic calcifications are present throughout the  cavernous internal carotid arteries bilaterally. There is no significant stenosis relative to the more distal vessel through the ICA termini. The A1 segments are normal bilaterally. The anterior communicating artery is patent. The left M1 segment is intact. The right M1 segment is occluded distally. The left MCA bifurcation is within normal limits. Left MCA branch vessels are intact. ACA branch vessels are normal bilaterally. There is marked attenuation right MCA branch vessels distal to the occlusion with minimal collateralization. Posterior circulation: The PICA origins are visualized and normal. Distal vertebral arteries are unremarkable. Vertebrobasilar junction is normal. Basilar artery is normal. The left posterior cerebral artery originates from the basilar tip. The right posterior cerebral artery is of fetal type. Mild irregularity of PCA branch vessels bilaterally. No significant proximal stenosis or occlusion is present. There is no aneurysm. Venous sinuses: Dural sinuses are patent. Straight sinus deep cerebral veins are intact. Cortical veins are unremarkable. Anatomic variants: None Review of the MIP images confirms the above findings CT Brain Perfusion Findings: CBF (<30%) Volume: 45mL Perfusion (Tmax>6.0s) volume: Mismatch Volume: 97mL Infarction Location:Right MCA territory IMPRESSION: 1. Emergent large vessel occlusion involving the distal right M1 segment. Poor collateralization. 2. Right MCA territory infarct with estimated volume of 45 mL. 3. Larger area of penumbra involving the majority of the right MCA territory, 142 mL. 4. 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 5. Moderate stenosis at the origin of the dominant left vertebral artery. 6. Tortuosity and vessel irregularity of the cervical internal carotid arteries bilaterally  suggesting fibromuscular dysplasia. No associated stenosis or dissection. 7. Moderate diffuse small vessel disease throughout the left MCA branch vessels, bilateral ACA branch vessels, and bilateral PCA branch vessels. These results were called by telephone at the time of interpretation on 11/04/2017 at 11:28 am to Dr. Bethann Berkshire , who verbally acknowledged these results. Electronically Signed   By: Marin Roberts M.D.   On: 11/04/2017 11:48   Ct Head Wo Contrast  Result Date: 11/04/2017 CLINICAL DATA:  Per ED note. Pt sent from [facility] due to left side hemiplegia, left sided facial droop, slurred speech,pt gazing To right side. Able to answer some questions. Last known well 7Pm last night. Per EMS pt is normally able to g.*comment was truncated*Altered level of consciousness (LOC), unexplained EXAM: CT HEAD WITHOUT CONTRAST TECHNIQUE: Contiguous axial images were obtained from the base of the skull through the vertex without intravenous contrast. COMPARISON:  Head CT 04/20/2006 FINDINGS: Brain: No acute intracranial hemorrhage. No focal mass lesion. No  CT evidence of acute infarction. No midline shift or mass effect. No hydrocephalus. Basilar cisterns are patent. Extensive periventricular white matter hypodensities are similar to but advanced from CT 2008. Cortical atrophy noted. No hydrocephalus. Vascular: No hyperdense vessel or unexpected calcification. Skull: Normal. Negative for fracture or focal lesion. Sinuses/Orbits: Paranasal sinuses and mastoid air cells are clear. Orbits are clear. Other: None. IMPRESSION: 1. No acute intracranial findings. 2. Extensive periventricular white matter hypodensities progressed from 2008 most suggestive of small vessel ischemia. 3. Atrophy. 4. No hydrocephalus. Electronically Signed   By: Genevive Bi M.D.   On: 11/04/2017 09:38   Ct Angio Neck W And/or Wo Contrast  Result Date: 11/04/2017 CLINICAL DATA:  Acute onset of left-sided hemiplegia,  left-sided facial droop, and slurred speech. Rightward gaze. Last known well at 7 p.m. last evening. EXAM: CT ANGIOGRAPHY HEAD AND NECK CT PERFUSION BRAIN TECHNIQUE: Multidetector CT imaging of the head and neck was performed using the standard protocol during bolus administration of intravenous contrast. Multiplanar CT image reconstructions and MIPs were obtained to evaluate the vascular anatomy. Carotid stenosis measurements (when applicable) are obtained utilizing NASCET criteria, using the distal internal carotid diameter as the denominator. Multiphase CT imaging of the brain was performed following IV bolus contrast injection. Subsequent parametric perfusion maps were calculated using RAPID software. CONTRAST:  ISOVUE-370 IOPAMIDOL (ISOVUE-370) INJECTION 76% COMPARISON:  CT head without contrast 11/04/2017. FINDINGS: CTA NECK FINDINGS Aortic arch: There is common origin of the left common carotid artery and innominate artery. Atherosclerotic changes are present at the arch and at the origins of the great vessels without significant stenosis. There is no aneurysm. Right carotid system: The right common carotid artery demonstrates distal wall calcifications. There are calcifications at the carotid bifurcation without significant stenosis. Moderate tortuosity is present in the cervical right ICA. There is wall irregularity and beading without focal stenosis or dissection. Left carotid system: The left common carotid artery demonstrates distal mural calcifications. Distal calcifications are in the left common carotid artery distally without significant luminal stenosis. Additional calcifications are present at the bifurcation significant stenosis. There is moderate tortuosity of the cervical left ICA. Vessel irregularity is present without significant stenosis. Vertebral arteries: Atherosclerotic calcifications are present at the origins of vertebral arteries bilaterally. There is moderate stenosis at the  origin of the left vertebral artery, dominant vessel. Marked tortuosity is present both vertebral arteries without significant stenosis otherwise. Skeleton: Multilevel degenerative changes are present in the cervical spine. Uncovertebral spurring is most evident at C5-6, left greater than right. There is foraminal narrowing associated. No focal lytic or blastic lesions are present. The patient is edentulous. Other neck: Patient is status post left thyroidectomy. Heterogeneity of the right lobe is present without a dominant lesion. No focal mucosal or submucosal abnormalities are present otherwise. The subcutaneous 14 mm nodule present just below the right mandible. This is likely a sebaceous cyst. Upper chest: The lung apices are clear.  Thoracic inlet is normal. Review of the MIP images confirms the above findings CTA HEAD FINDINGS Anterior circulation: Dense atherosclerotic calcifications are present throughout the cavernous internal carotid arteries bilaterally. There is no significant stenosis relative to the more distal vessel through the ICA termini. The A1 segments are normal bilaterally. The anterior communicating artery is patent. The left M1 segment is intact. The right M1 segment is occluded distally. The left MCA bifurcation is within normal limits. Left MCA branch vessels are intact. ACA branch vessels are normal bilaterally. There is marked attenuation right  MCA branch vessels distal to the occlusion with minimal collateralization. Posterior circulation: The PICA origins are visualized and normal. Distal vertebral arteries are unremarkable. Vertebrobasilar junction is normal. Basilar artery is normal. The left posterior cerebral artery originates from the basilar tip. The right posterior cerebral artery is of fetal type. Mild irregularity of PCA branch vessels bilaterally. No significant proximal stenosis or occlusion is present. There is no aneurysm. Venous sinuses: Dural sinuses are patent. Straight  sinus deep cerebral veins are intact. Cortical veins are unremarkable. Anatomic variants: None Review of the MIP images confirms the above findings CT Brain Perfusion Findings: CBF (<30%) Volume: 45mL Perfusion (Tmax>6.0s) volume: Mismatch Volume: 97mL Infarction Location:Right MCA territory IMPRESSION: 1. Emergent large vessel occlusion involving the distal right M1 segment. Poor collateralization. 2. Right MCA territory infarct with estimated volume of 45 mL. 3. Larger area of penumbra involving the majority of the right MCA territory, 142 mL. 4. 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 5. Moderate stenosis at the origin of the dominant left vertebral artery. 6. Tortuosity and vessel irregularity of the cervical internal carotid arteries bilaterally suggesting fibromuscular dysplasia. No associated stenosis or dissection. 7. Moderate diffuse small vessel disease throughout the left MCA branch vessels, bilateral ACA branch vessels, and bilateral PCA branch vessels. These results were called by telephone at the time of interpretation on 11/04/2017 at 11:28 am to Dr. Bethann Berkshire , who verbally acknowledged these results. Electronically Signed   By: Marin Roberts M.D.   On: 11/04/2017 11:48   Ct Cerebral Perfusion W Contrast  Result Date: 11/04/2017 CLINICAL DATA:  Acute onset of left-sided hemiplegia, left-sided facial droop, and slurred speech. Rightward gaze. Last known well at 7 p.m. last evening. EXAM: CT ANGIOGRAPHY HEAD AND NECK CT PERFUSION BRAIN TECHNIQUE: Multidetector CT imaging of the head and neck was performed using the standard protocol during bolus administration of intravenous contrast. Multiplanar CT image reconstructions and MIPs were obtained to evaluate the vascular anatomy. Carotid stenosis measurements (when  applicable) are obtained utilizing NASCET criteria, using the distal internal carotid diameter as the denominator. Multiphase CT imaging of the brain was performed following IV bolus contrast injection. Subsequent parametric perfusion maps were calculated using RAPID software. CONTRAST:  ISOVUE-370 IOPAMIDOL (ISOVUE-370) INJECTION 76% COMPARISON:  CT head without contrast 11/04/2017. FINDINGS: CTA NECK FINDINGS Aortic arch: There is common origin of the left common carotid artery and innominate artery. Atherosclerotic changes are present at the arch and at the origins of the great vessels without significant stenosis. There is no aneurysm. Right carotid system: The right common carotid artery demonstrates distal wall calcifications. There are calcifications at the carotid bifurcation without significant stenosis. Moderate tortuosity is present in the cervical right ICA. There is wall irregularity and beading without focal stenosis or dissection. Left carotid system: The left common carotid artery demonstrates distal mural calcifications. Distal calcifications are in the left common carotid artery distally without significant luminal stenosis. Additional calcifications are present at the bifurcation significant stenosis. There is moderate tortuosity of the cervical left ICA. Vessel irregularity is present without significant stenosis. Vertebral arteries: Atherosclerotic calcifications are present at the origins of vertebral arteries bilaterally. There is moderate stenosis at the origin of the left vertebral artery, dominant vessel. Marked tortuosity is present both vertebral arteries without significant stenosis otherwise. Skeleton: Multilevel degenerative changes are present in the cervical spine. Uncovertebral spurring is most  evident at C5-6, left greater than right. There is foraminal narrowing associated. No focal lytic or blastic lesions are present. The patient is edentulous. Other neck: Patient is  status post left thyroidectomy. Heterogeneity of the right lobe is present without a dominant lesion. No focal mucosal or submucosal abnormalities are present otherwise. The subcutaneous 14 mm nodule present just below the right mandible. This is likely a sebaceous cyst. Upper chest: The lung apices are clear.  Thoracic inlet is normal. Review of the MIP images confirms the above findings CTA HEAD FINDINGS Anterior circulation: Dense atherosclerotic calcifications are present throughout the cavernous internal carotid arteries bilaterally. There is no significant stenosis relative to the more distal vessel through the ICA termini. The A1 segments are normal bilaterally. The anterior communicating artery is patent. The left M1 segment is intact. The right M1 segment is occluded distally. The left MCA bifurcation is within normal limits. Left MCA branch vessels are intact. ACA branch vessels are normal bilaterally. There is marked attenuation right MCA branch vessels distal to the occlusion with minimal collateralization. Posterior circulation: The PICA origins are visualized and normal. Distal vertebral arteries are unremarkable. Vertebrobasilar junction is normal. Basilar artery is normal. The left posterior cerebral artery originates from the basilar tip. The right posterior cerebral artery is of fetal type. Mild irregularity of PCA branch vessels bilaterally. No significant proximal stenosis or occlusion is present. There is no aneurysm. Venous sinuses: Dural sinuses are patent. Straight sinus deep cerebral veins are intact. Cortical veins are unremarkable. Anatomic variants: None Review of the MIP images confirms the above findings CT Brain Perfusion Findings: CBF (<30%) Volume: 45mL Perfusion (Tmax>6.0s) volume: Mismatch Volume: 97mL Infarction Location:Right MCA territory IMPRESSION: 1. Emergent large vessel occlusion involving the distal right M1 segment. Poor collateralization. 2. Right MCA territory  infarct with estimated volume of 45 mL. 3. Larger area of penumbra involving the majority of the right MCA territory, 142 mL. 4. 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 5. Moderate stenosis at the origin of the dominant left vertebral artery. 6. Tortuosity and vessel irregularity of the cervical internal carotid arteries bilaterally suggesting fibromuscular dysplasia. No associated stenosis or dissection. 7. Moderate diffuse small vessel disease throughout the left MCA branch vessels, bilateral ACA branch vessels, and bilateral PCA branch vessels. These results were called by telephone at the time of interpretation on 11/04/2017 at 11:28 am to Dr. Bethann Berkshire , who verbally acknowledged these results. Electronically Signed   By: Marin Roberts M.D.   On: 11/04/2017 11:48   Dg Chest Port 1 View  Result Date: 10/31/2017 CLINICAL DATA:  Sudden onset dizziness and weakness starting at 1500 hours. EXAM: PORTABLE CHEST 1 VIEW COMPARISON:  12/27/2015 FINDINGS: Stable cardiomegaly with moderate aortic atherosclerosis. Slight hyperinflation lungs without pulmonary consolidation, effusion or pneumothorax. Slight pulmonary vascular congestion is redemonstrated. Surgical clips project over the base of neck and superior mediastinum as before. Osteoarthritis of the included AC and glenohumeral joints slight high-riding of the right humeral head. IMPRESSION: Stable cardiomegaly with aortic atherosclerosis. Mild pulmonary vascular congestion. Electronically Signed   By: Tollie Eth M.D.   On: 10/31/2017 16:30    Assessment/Plan  Acute Right MCA stroke with Left Hemiparesis Continue Hospice care Will have Therapy evaluate for Comfort Therapy as patient is more responsive now. Will decrease her Ativan and Roxanol She is not getting it anyway Continue  on  Low dose of Aspirin and Lopressor D/W [facility]   Family/ staff Communication:   Labs/tests ordered:   Total time spent in this patient care encounter was 25_ minutes; greater than 50% of the visit spent counseling patient, reviewing records , Labs and coordinating care for problems addressed at this encounter.

## 2017-11-23 ENCOUNTER — Non-Acute Institutional Stay (SKILLED_NURSING_FACILITY): Payer: Medicare Other | Admitting: Internal Medicine

## 2017-11-23 DIAGNOSIS — R5383 Other fatigue: Secondary | ICD-10-CM | POA: Diagnosis not present

## 2017-11-23 DIAGNOSIS — I639 Cerebral infarction, unspecified: Secondary | ICD-10-CM | POA: Diagnosis not present

## 2017-11-24 ENCOUNTER — Encounter: Payer: Self-pay | Admitting: Internal Medicine

## 2017-11-24 NOTE — Progress Notes (Signed)
This is an acute visit.  Level care skilled.  Facility is [facility].  Chief complaint acute visit follow-up CVA.  History of present illness.  Patient is a 84 year old female seen today for acute visit to assess her after sustaining a CVA with residual left-sided hemiparalysis-.  Patient is now under largely comfort care measures per prole her prognosis and family wishes for no aggressive measures.  She also has a history of coronary artery disease as well as atrial fibrillation hypertension hyperlipidemia peripheral arterial disease.  Patient was sent to the hospital with acute left hemiparesis and was found to have a right MCA stroke not a candidate for TPA her family wanted no aggressive measures here now for essentially comfort care.  During her stay here she actually has made improvement and became more alert talking more. Today she appears to be weaker  is talking some but is less responsive sleeping more        Past Medical History:  Diagnosis Date  . Acid reflux   . Carotid artery occlusion    minimal  . Coronary artery disease   . Hypercholesteremia   . Hypertension   . Myocardial infarct (HCC)   . PAF (paroxysmal atrial fibrillation) (HCC)   . Skin disorder   . Systolic murmur   . Thyroid disease         Past Surgical History:  Procedure Laterality Date  . ABDOMINAL HYSTERECTOMY    . AMPUTATION Right 01/05/2016   Procedure: RIGHT GREAT TOE AMPUTATION;  Surgeon: Nada Libman, MD;  Location: [facility] OR;  Service: Vascular;  Laterality: Right;  . CARDIAC CATHETERIZATION    . CARDIOVERSION  2012  . IR GENERIC HISTORICAL  12/30/2015   IR ANGIOGRAM EXTREMITY BILATERAL 12/30/2015 [facility]  . IR GENERIC HISTORICAL  12/30/2015   IR ANGIOGRAM SELECTIVE EACH ADDITIONAL VESSEL 12/30/2015 [facility]  . LOWER EXTREMITY ANGIOGRAM Right 12/30/2015   Procedure: RIGHT LOWER EXTREMITY ANGIOGRAM;  Surgeon: Ancil Linsey, MD;   Location: [facility];  Service: Vascular;  Laterality: Right;  accessed through left groin area - dressed with two 2x2 and 4x4 opsite  . PERIPHERAL VASCULAR CATHETERIZATION N/A 01/04/2016   Procedure: Lower Extremity Angiography;  Surgeon: Nada Libman, MD;  Location: [facility] INVASIVE CV LAB;  Service: Cardiovascular;  Laterality: N/A;  . PERIPHERAL VASCULAR CATHETERIZATION Right 01/04/2016   Procedure: Peripheral Vascular Atherectomy;  Surgeon: Nada Libman, MD;  Location: [facility] INVASIVE CV LAB;  Service: Cardiovascular;  Laterality: Right;  Peroneal  . PERIPHERAL VASCULAR CATHETERIZATION N/A 02/01/2016   Procedure: Abdominal Aortogram w/Lower Extremity;  Surgeon: Nada Libman, MD;  Location: [facility] INVASIVE CV LAB;  Service: Cardiovascular;  Laterality: N/A;  . PERIPHERAL VASCULAR CATHETERIZATION  02/01/2016   Procedure: Peripheral Vascular Atherectomy;  Surgeon: Nada Libman, MD;  Location: [facility] INVASIVE CV LAB;  Service: Cardiovascular;;  Left   . THYROID SURGERY      No Known Allergies   MEDICATIONS      Medication Sig  . acetaminophen (TYLENOL) 325 MG tablet Take 650 mg by mouth every 4 (four) hours as needed.   Marland Kitchen aspirin EC 81 MG tablet Take 1 tablet (81 mg total) by mouth daily.  . bisacodyl (DULCOLAX) 10 MG suppository Place 1 suppository (10 mg total) rectally daily as needed for moderate constipation.  Marland Kitchen glycopyrrolate (ROBINUL) 1 MG tablet Take 1 tablet (1 mg total) by mouth every 4 (four) hours as needed (excessive secretions).  Marland Kitchen levothyroxine (SYNTHROID, LEVOTHROID) 100 MCG  tablet Take 100 mcg by mouth daily before breakfast.  . LORazepam (ATIVAN) 2 MG/ML concentrated solution Place 0.5 mLs (1 mg total) under the tongue every 4 (four) hours as needed for anxiety.  . metoprolol succinate (TOPROL-XL) 25 MG 24 hr tablet Take 0.5 tablets (12.5 mg total) by mouth daily.  . ondansetron (ZOFRAN-ODT) 4 MG disintegrating tablet Take 1 tablet (4 mg total) by mouth every 6 (six) hours as  needed for nausea.  Marland Kitchen. senna (SENOKOT) 8.6 MG TABS tablet Take 2 tablets by mouth daily.   . [DISCONTINUED] Morphine Sulfate (MORPHINE CONCENTRATE) 10 MG/0.5ML SOLN concentrated solution Place 0.25 mLs (5 mg total) under the tongue every 2 (two) hours as needed for moderate pain (or dyspnea).    Review of systems as this is not really unobtainable at all- per nursing no signs of discomfort   Physical exam to blood pressure manually was difficult to auscultate- appear to be around 140/90- she is afebrile respirations are 16 in general this is a very elderly female she is actually sleeping when talked to she at times will speak with one-word responses and opens her eyes intermittently.   Chest is clear to auscultation with poor respiratory effort there is no labored breathing  Heart is regular irregular rate and rhythm she does not have significant edema.  Abdomen is soft does not appear to be tender there are positive bowel sounds.  Musculoskeletal difficult to fully assess since patient is not really following many verbal commands.  Neurologic she does have left-sided hemiparalysis is lethargic.  Psych she is lethargic does respond at times and will open her eyes and speak intermittently appears quite weak.    Labs.  November 04, 2017.  Sodium 144 potassium 4.2 BUN 23 creatinine 1.20.  WBC 5.1 hemoglobin 16.0 platelets 331.  Assessment and plan.  1.  History of acute CVA with left-sided hemiparesis.  And increased lethargy  She is now under hospice care.  At one point patient was somewhat more responsive but today appears to be more lethargic.  Her Ativan and Roxanol were recently decreased- I although appears she did is not taking it.  This is PRN.  At this point appears to be declining this was discussed with her family- I suspect she may have some days better than others-at this point will continue to monitor continue comfort care it he does not appear to be in any  distress  [phone]

## 2017-11-27 ENCOUNTER — Encounter: Payer: Self-pay | Admitting: Internal Medicine

## 2017-11-27 ENCOUNTER — Non-Acute Institutional Stay (SKILLED_NURSING_FACILITY): Payer: Medicare Other | Admitting: Internal Medicine

## 2017-11-27 DIAGNOSIS — Z515 Encounter for palliative care: Secondary | ICD-10-CM | POA: Diagnosis not present

## 2017-11-27 DIAGNOSIS — I639 Cerebral infarction, unspecified: Secondary | ICD-10-CM

## 2017-12-02 NOTE — Progress Notes (Signed)
Location:    [facility] Room Number: 102/P Place of Service:  SNF [phone]) Provider:  Einar Crow MD  Mahlon Gammon, MD  Patient Care Team: Mahlon Gammon, MD as PCP - General (Internal Medicine) Wyline Mood [name], MD as PCP - Cardiology (Cardiology) Synetta Shadow as Physician Assistant (Internal Medicine)  Extended Emergency Contact Information Primary Emergency Contact: Casimer Lanius [place] Home Phone: [phone] Mobile Phone: [phone] Relation: None Secondary Emergency Contact: Margarito Courser [place] Home Phone: [phone] Relation: None  Code Status:  DNR Goals of care: Advanced Directive information Advanced Directives 15-Dec-2017  Does Patient Have a Medical Advance Directive? Yes  Type of Advance Directive Out of facility DNR (pink MOST or yellow form)  Does patient want to make changes to medical advance directive? No - Patient declined  Copy of Healthcare Power of Attorney in Chart? No - copy requested  Would patient like information on creating a medical advance directive? No - Patient declined  Pre-existing out of facility DNR order (yellow form or pink MOST form) -     Chief Complaint  Patient presents with  . Acute Visit    Pain in foot/ End of life visit    HPI:  Pt is a 84 y.o. female seen today for medical management of chronic diseases.    Patient has H/OCAD,Paroxysmal Atrial FibrillationHypertension, Hyperlipidemia., PAD,Right Great Toe Amputation in 01/18 And left Peroneal Atherectomy who was admitted for Acute Left Hemiparesis. Family decided to make her Comfort Care Patient seen today as she is having apneic Breathing and is not responding. She is also not taking any PO meds. D/W the Hospice nurse and family in room. She is not Responding But seems Comfortable    Past Medical History:  Diagnosis Date  . Acid reflux   . Carotid artery occlusion    minimal  .  Coronary artery disease   . Hypercholesteremia   . Hypertension   . Myocardial infarct (HCC)   . PAF (paroxysmal atrial fibrillation) (HCC)   . Skin disorder   . Systolic murmur   . Thyroid disease    Past Surgical History:  Procedure Laterality Date  . ABDOMINAL HYSTERECTOMY    . AMPUTATION Right 01/05/2016   Procedure: RIGHT GREAT TOE AMPUTATION;  Surgeon: Nada Libman, MD;  Location: [facility] OR;  Service: Vascular;  Laterality: Right;  . CARDIAC CATHETERIZATION    . CARDIOVERSION  2012  . IR GENERIC HISTORICAL  12/30/2015   IR ANGIOGRAM EXTREMITY BILATERAL 12/30/2015 [facility]  . IR GENERIC HISTORICAL  12/30/2015   IR ANGIOGRAM SELECTIVE EACH ADDITIONAL VESSEL 12/30/2015 [facility]  . LOWER EXTREMITY ANGIOGRAM Right 12/30/2015   Procedure: RIGHT LOWER EXTREMITY ANGIOGRAM;  Surgeon: Ancil Linsey, MD;  Location: [facility];  Service: Vascular;  Laterality: Right;  accessed through left groin area - dressed with two 2x2 and 4x4 opsite  . PERIPHERAL VASCULAR CATHETERIZATION N/A 01/04/2016   Procedure: Lower Extremity Angiography;  Surgeon: Nada Libman, MD;  Location: [facility] INVASIVE CV LAB;  Service: Cardiovascular;  Laterality: N/A;  . PERIPHERAL VASCULAR CATHETERIZATION Right 01/04/2016   Procedure: Peripheral Vascular Atherectomy;  Surgeon: Nada Libman, MD;  Location: [facility] INVASIVE CV LAB;  Service: Cardiovascular;  Laterality: Right;  Peroneal  . PERIPHERAL VASCULAR CATHETERIZATION N/A 02/01/2016   Procedure: Abdominal Aortogram w/Lower Extremity;  Surgeon: Nada Libman, MD;  Location: [facility] INVASIVE CV LAB;  Service: Cardiovascular;  Laterality: N/A;  . PERIPHERAL  VASCULAR CATHETERIZATION  02/01/2016   Procedure: Peripheral Vascular Atherectomy;  Surgeon: Nada Libman, MD;  Location: [facility] INVASIVE CV LAB;  Service: Cardiovascular;;  Left   . THYROID SURGERY      No Known Allergies  Outpatient Encounter Medications as of 12/07/2017  Medication Sig  . acetaminophen  (TYLENOL) 325 MG tablet Take 650 mg by mouth every 4 (four) hours as needed.   Marland Kitchen aspirin EC 81 MG tablet Take 1 tablet (81 mg total) by mouth daily.  . bisacodyl (DULCOLAX) 10 MG suppository Place 1 suppository (10 mg total) rectally daily as needed for moderate constipation.  Marland Kitchen glycopyrrolate (ROBINUL) 1 MG tablet Take 1 tablet (1 mg total) by mouth every 4 (four) hours as needed (excessive secretions).  Marland Kitchen levothyroxine (SYNTHROID, LEVOTHROID) 100 MCG tablet Take 100 mcg by mouth daily before breakfast.  . LORazepam (ATIVAN) 2 MG/ML concentrated solution Place 0.5 mLs (1 mg total) under the tongue every 4 (four) hours as needed for anxiety.  . metoprolol succinate (TOPROL-XL) 25 MG 24 hr tablet Take 0.5 tablets (12.5 mg total) by mouth daily.  Marland Kitchen morphine (ROXANOL) 20 MG/ML concentrated solution Take 20 mg by mouth every 2 (two) hours as needed for severe pain.  Marland Kitchen ondansetron (ZOFRAN-ODT) 4 MG disintegrating tablet Take 1 tablet (4 mg total) by mouth every 6 (six) hours as needed for nausea.  Marland Kitchen senna (SENOKOT) 8.6 MG TABS tablet Take 2 tablets by mouth daily.    No facility-administered encounter medications on file as of December 07, 2017.      Review of Systems  Immunization History  Administered Date(s) Administered  . Influenza-Unspecified 10/06/2016  . Pneumococcal Conjugate-13 08/24/2016  . Tdap 09/14/2016   Pertinent  Health Maintenance Due  Topic Date Due  . DEXA SCAN  12/27/2017 (Originally 02/09/1985)  . INFLUENZA VACCINE  Completed  . PNA vac Low Risk Adult  Completed   Fall Risk  06/05/2017  Falls in the past year? No   Functional Status Survey:    Vitals:   12-07-2017 1001  BP: (!) 100/56  Pulse: (!) 107  Resp: (!) 22  Temp: 97.9 F (36.6 C)  TempSrc: Oral  SpO2: 96%   There is no height or weight on file to calculate BMI. Physical Exam  Constitutional: She appears well-developed and well-nourished.  HENT:  Head: Normocephalic.  Mouth/Throat: Oropharynx is clear and  moist.  Eyes: Pupils are equal, round, and reactive to light.  Neck: Neck supple.  Cardiovascular: Normal rate. An irregular rhythm present.  No murmur heard. Pulmonary/Chest: Effort normal. She has no wheezes. She has rales.  Has apneic Breathing with Rales Bilateral  Abdominal: Soft. Bowel sounds are normal. She exhibits no distension. There is no tenderness. There is no guarding.  Musculoskeletal: She exhibits no edema.  Neurological:  Unresponsive Left Hemiparesis with Left Facial Droop LLE and LUE 0/5  Skin: Skin is warm and dry.    Labs reviewed: Recent Labs    08/08/17 0415 10/31/17 1608 11/04/17 0752 11/04/17 0757  NA 142 141 142 144  K 3.6 3.7 4.0 4.2  CL 108 103 107 106  CO2 27 26 27   --   GLUCOSE 83 114* 95 92  BUN 33* 30* 20 23  CREATININE 1.29* 1.22* 1.07* 1.20*  CALCIUM 8.6* 9.2 9.0  --   MG  --  2.4  --   --    Recent Labs    03/07/17 1450 10/31/17 1608 11/04/17 0752  AST 22 23 20   ALT 15  19 16  ALKPHOS 117 116 101  BILITOT 0.7 0.6 0.7  PROT 7.2 7.9 7.5  ALBUMIN 3.7 4.1 3.8   Recent Labs    08/08/17 0415 10/31/17 1608 11/04/17 0752 11/04/17 0757  WBC 4.6 5.1 5.9  --   NEUTROABS 2.5 3.4 3.7  --   HGB 13.4 16.0* 14.1 14.6  HCT 40.3 50.4* 44.4 43.0  MCV 94.2 94.7 95.9  --   PLT 230 331 260  --    Lab Results  Component Value Date   TSH 0.969 10/31/2017   No results found for: HGBA1C Lab Results  Component Value Date   CHOL 145 09/19/2016   HDL 41 09/19/2016   LDLCALC 81 09/19/2016   TRIG 116 09/19/2016   CHOLHDL 3.5 09/19/2016    Significant Diagnostic Results in last 30 days:  Ct Angio Head W Or Wo Contrast  Result Date: 11/04/2017 CLINICAL DATA:  Acute onset of left-sided hemiplegia, left-sided facial droop, and slurred speech. Rightward gaze. Last known well at 7 p.m. last evening. EXAM: CT ANGIOGRAPHY HEAD AND NECK CT PERFUSION BRAIN TECHNIQUE: Multidetector CT imaging of the head and neck was performed using the standard  protocol during bolus administration of intravenous contrast. Multiplanar CT image reconstructions and MIPs were obtained to evaluate the vascular anatomy. Carotid stenosis measurements (when applicable) are obtained utilizing NASCET criteria, using the distal internal carotid diameter as the denominator. Multiphase CT imaging of the brain was performed following IV bolus contrast injection. Subsequent parametric perfusion maps were calculated using RAPID software. CONTRAST:  ISOVUE-370 IOPAMIDOL (ISOVUE-370) INJECTION 76% COMPARISON:  CT head without contrast 11/04/2017. FINDINGS: CTA NECK FINDINGS Aortic arch: There is common origin of the left common carotid artery and innominate artery. Atherosclerotic changes are present at the arch and at the origins of the great vessels without significant stenosis. There is no aneurysm. Right carotid system: The right common carotid artery demonstrates distal wall calcifications. There are calcifications at the carotid bifurcation without significant stenosis. Moderate tortuosity is present in the cervical right ICA. There is wall irregularity and beading without focal stenosis or dissection. Left carotid system: The left common carotid artery demonstrates distal mural calcifications. Distal calcifications are in the left common carotid artery distally without significant luminal stenosis. Additional calcifications are present at the bifurcation significant stenosis. There is moderate tortuosity of the cervical left ICA. Vessel irregularity is present without significant stenosis. Vertebral arteries: Atherosclerotic calcifications are present at the origins of vertebral arteries bilaterally. There is moderate stenosis at the origin of the left vertebral artery, dominant vessel. Marked tortuosity is present both vertebral arteries without significant stenosis otherwise. Skeleton: Multilevel degenerative changes are present in the cervical spine. Uncovertebral spurring  is most evident at C5-6, left greater than right. There is foraminal narrowing associated. No focal lytic or blastic lesions are present. The patient is edentulous. Other neck: Patient is status post left thyroidectomy. Heterogeneity of the right lobe is present without a dominant lesion. No focal mucosal or submucosal abnormalities are present otherwise. The subcutaneous 14 mm nodule present just below the right mandible. This is likely a sebaceous cyst. Upper chest: The lung apices are clear.  Thoracic inlet is normal. Review of the MIP images confirms the above findings CTA HEAD FINDINGS Anterior circulation: Dense atherosclerotic calcifications are present throughout the cavernous internal carotid arteries bilaterally. There is no significant stenosis relative to the more distal vessel through the ICA termini. The A1 segments are normal bilaterally. The anterior communicating artery is patent. The  left M1 segment is intact. The right M1 segment is occluded distally. The left MCA bifurcation is within normal limits. Left MCA branch vessels are intact. ACA branch vessels are normal bilaterally. There is marked attenuation right MCA branch vessels distal to the occlusion with minimal collateralization. Posterior circulation: The PICA origins are visualized and normal. Distal vertebral arteries are unremarkable. Vertebrobasilar junction is normal. Basilar artery is normal. The left posterior cerebral artery originates from the basilar tip. The right posterior cerebral artery is of fetal type. Mild irregularity of PCA branch vessels bilaterally. No significant proximal stenosis or occlusion is present. There is no aneurysm. Venous sinuses: Dural sinuses are patent. Straight sinus deep cerebral veins are intact. Cortical veins are unremarkable. Anatomic variants: None Review of the MIP images confirms the above findings CT Brain Perfusion Findings: CBF (<30%) Volume: 45mL Perfusion (Tmax>6.0s) volume: Mismatch  Volume: 97mL Infarction Location:Right MCA territory IMPRESSION: 1. Emergent large vessel occlusion involving the distal right M1 segment. Poor collateralization. 2. Right MCA territory infarct with estimated volume of 45 mL. 3. Larger area of penumbra involving the majority of the right MCA territory, 142 mL. 4. 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 5. Moderate stenosis at the origin of the dominant left vertebral artery. 6. Tortuosity and vessel irregularity of the cervical internal carotid arteries bilaterally suggesting fibromuscular dysplasia. No associated stenosis or dissection. 7. Moderate diffuse small vessel disease throughout the left MCA branch vessels, bilateral ACA branch vessels, and bilateral PCA branch vessels. These results were called by telephone at the time of interpretation on 11/04/2017 at 11:28 am to Dr. Bethann Berkshire , who verbally acknowledged these results. Electronically Signed   By: Marin Roberts M.D.   On: 11/04/2017 11:48   Ct Head Wo Contrast  Result Date: 11/04/2017 CLINICAL DATA:  Per ED note. Pt sent from [facility] due to left side hemiplegia, left sided facial droop, slurred speech,pt gazing To right side. Able to answer some questions. Last known well 7Pm last night. Per EMS pt is normally able to g.*comment was truncated*Altered level of consciousness (LOC), unexplained EXAM: CT HEAD WITHOUT CONTRAST TECHNIQUE: Contiguous axial images were obtained from the base of the skull through the vertex without intravenous contrast. COMPARISON:  Head CT 04/20/2006 FINDINGS: Brain: No acute intracranial hemorrhage. No focal mass lesion. No CT evidence of acute infarction. No midline shift or mass effect. No hydrocephalus. Basilar cisterns are patent. Extensive periventricular white matter hypodensities are similar  to but advanced from CT 2008. Cortical atrophy noted. No hydrocephalus. Vascular: No hyperdense vessel or unexpected calcification. Skull: Normal. Negative for fracture or focal lesion. Sinuses/Orbits: Paranasal sinuses and mastoid air cells are clear. Orbits are clear. Other: None. IMPRESSION: 1. No acute intracranial findings. 2. Extensive periventricular white matter hypodensities progressed from 2008 most suggestive of small vessel ischemia. 3. Atrophy. 4. No hydrocephalus. Electronically Signed   By: Genevive Bi M.D.   On: 11/04/2017 09:38   Ct Angio Neck W And/or Wo Contrast  Result Date: 11/04/2017 CLINICAL DATA:  Acute onset of left-sided hemiplegia, left-sided facial droop, and slurred speech. Rightward gaze. Last known well at 7 p.m. last evening. EXAM: CT ANGIOGRAPHY HEAD AND NECK CT PERFUSION BRAIN TECHNIQUE: Multidetector CT imaging of the head and neck was performed using the standard protocol during bolus administration of intravenous contrast. Multiplanar CT image reconstructions and MIPs were obtained to evaluate the  vascular anatomy. Carotid stenosis measurements (when applicable) are obtained utilizing NASCET criteria, using the distal internal carotid diameter as the denominator. Multiphase CT imaging of the brain was performed following IV bolus contrast injection. Subsequent parametric perfusion maps were calculated using RAPID software. CONTRAST:  ISOVUE-370 IOPAMIDOL (ISOVUE-370) INJECTION 76% COMPARISON:  CT head without contrast 11/04/2017. FINDINGS: CTA NECK FINDINGS Aortic arch: There is common origin of the left common carotid artery and innominate artery. Atherosclerotic changes are present at the arch and at the origins of the great vessels without significant stenosis. There is no aneurysm. Right carotid system: The right common carotid artery demonstrates distal wall calcifications. There are calcifications at the carotid bifurcation without significant stenosis.  Moderate tortuosity is present in the cervical right ICA. There is wall irregularity and beading without focal stenosis or dissection. Left carotid system: The left common carotid artery demonstrates distal mural calcifications. Distal calcifications are in the left common carotid artery distally without significant luminal stenosis. Additional calcifications are present at the bifurcation significant stenosis. There is moderate tortuosity of the cervical left ICA. Vessel irregularity is present without significant stenosis. Vertebral arteries: Atherosclerotic calcifications are present at the origins of vertebral arteries bilaterally. There is moderate stenosis at the origin of the left vertebral artery, dominant vessel. Marked tortuosity is present both vertebral arteries without significant stenosis otherwise. Skeleton: Multilevel degenerative changes are present in the cervical spine. Uncovertebral spurring is most evident at C5-6, left greater than right. There is foraminal narrowing associated. No focal lytic or blastic lesions are present. The patient is edentulous. Other neck: Patient is status post left thyroidectomy. Heterogeneity of the right lobe is present without a dominant lesion. No focal mucosal or submucosal abnormalities are present otherwise. The subcutaneous 14 mm nodule present just below the right mandible. This is likely a sebaceous cyst. Upper chest: The lung apices are clear.  Thoracic inlet is normal. Review of the MIP images confirms the above findings CTA HEAD FINDINGS Anterior circulation: Dense atherosclerotic calcifications are present throughout the cavernous internal carotid arteries bilaterally. There is no significant stenosis relative to the more distal vessel through the ICA termini. The A1 segments are normal bilaterally. The anterior communicating artery is patent. The left M1 segment is intact. The right M1 segment is occluded distally. The left MCA bifurcation is within  normal limits. Left MCA branch vessels are intact. ACA branch vessels are normal bilaterally. There is marked attenuation right MCA branch vessels distal to the occlusion with minimal collateralization. Posterior circulation: The PICA origins are visualized and normal. Distal vertebral arteries are unremarkable. Vertebrobasilar junction is normal. Basilar artery is normal. The left posterior cerebral artery originates from the basilar tip. The right posterior cerebral artery is of fetal type. Mild irregularity of PCA branch vessels bilaterally. No significant proximal stenosis or occlusion is present. There is no aneurysm. Venous sinuses: Dural sinuses are patent. Straight sinus deep cerebral veins are intact. Cortical veins are unremarkable. Anatomic variants: None Review of the MIP images confirms the above findings CT Brain Perfusion Findings: CBF (<30%) Volume: 45mL Perfusion (Tmax>6.0s) volume: Mismatch Volume: 97mL Infarction Location:Right MCA territory IMPRESSION: 1. Emergent large vessel occlusion involving the distal right M1 segment. Poor collateralization. 2. Right MCA territory infarct with estimated volume of 45 mL. 3. Larger area of penumbra involving the majority of the right MCA territory, 142 mL. 4. Atherosclerotic disease involving the origin of the innominate artery, the right carotid bifurcation, and most particularly within the cavernous right internal carotid artery  without significant stenosis. 5. Moderate stenosis at the origin of the dominant left vertebral artery. 6. Tortuosity and vessel irregularity of the cervical internal carotid arteries bilaterally suggesting fibromuscular dysplasia. No associated stenosis or dissection. 7. Moderate diffuse small vessel disease throughout the left MCA branch vessels, bilateral ACA branch vessels, and bilateral PCA branch vessels. These results were called by telephone at the time of interpretation on 11/04/2017 at 11:28 am to Dr. Bethann Berkshire ,  who verbally acknowledged these results. Electronically Signed   By: Marin Roberts M.D.   On: 11/04/2017 11:48   Ct Cerebral Perfusion W Contrast  Result Date: 11/04/2017 CLINICAL DATA:  Acute onset of left-sided hemiplegia, left-sided facial droop, and slurred speech. Rightward gaze. Last known well at 7 p.m. last evening. EXAM: CT ANGIOGRAPHY HEAD AND NECK CT PERFUSION BRAIN TECHNIQUE: Multidetector CT imaging of the head and neck was performed using the standard protocol during bolus administration of intravenous contrast. Multiplanar CT image reconstructions and MIPs were obtained to evaluate the vascular anatomy. Carotid stenosis measurements (when applicable) are obtained utilizing NASCET criteria, using the distal internal carotid diameter as the denominator. Multiphase CT imaging of the brain was performed following IV bolus contrast injection. Subsequent parametric perfusion maps were calculated using RAPID software. CONTRAST:  ISOVUE-370 IOPAMIDOL (ISOVUE-370) INJECTION 76% COMPARISON:  CT head without contrast 11/04/2017. FINDINGS: CTA NECK FINDINGS Aortic arch: There is common origin of the left common carotid artery and innominate artery. Atherosclerotic changes are present at the arch and at the origins of the great vessels without significant stenosis. There is no aneurysm. Right carotid system: The right common carotid artery demonstrates distal wall calcifications. There are calcifications at the carotid bifurcation without significant stenosis. Moderate tortuosity is present in the cervical right ICA. There is wall irregularity and beading without focal stenosis or dissection. Left carotid system: The left common carotid artery demonstrates distal mural calcifications. Distal calcifications are in the left common carotid artery distally without significant luminal stenosis. Additional calcifications are present at the bifurcation significant stenosis. There is moderate tortuosity  of the cervical left ICA. Vessel irregularity is present without significant stenosis. Vertebral arteries: Atherosclerotic calcifications are present at the origins of vertebral arteries bilaterally. There is moderate stenosis at the origin of the left vertebral artery, dominant vessel. Marked tortuosity is present both vertebral arteries without significant stenosis otherwise. Skeleton: Multilevel degenerative changes are present in the cervical spine. Uncovertebral spurring is most evident at C5-6, left greater than right. There is foraminal narrowing associated. No focal lytic or blastic lesions are present. The patient is edentulous. Other neck: Patient is status post left thyroidectomy. Heterogeneity of the right lobe is present without a dominant lesion. No focal mucosal or submucosal abnormalities are present otherwise. The subcutaneous 14 mm nodule present just below the right mandible. This is likely a sebaceous cyst. Upper chest: The lung apices are clear.  Thoracic inlet is normal. Review of the MIP images confirms the above findings CTA HEAD FINDINGS Anterior circulation: Dense atherosclerotic calcifications are present throughout the cavernous internal carotid arteries bilaterally. There is no significant stenosis relative to the more distal vessel through the ICA termini. The A1 segments are normal bilaterally. The anterior communicating artery is patent. The left M1 segment is intact. The right M1 segment is occluded distally. The left MCA bifurcation is within normal limits. Left MCA branch vessels are intact. ACA branch vessels are normal bilaterally. There is marked attenuation right MCA branch vessels distal to the occlusion with minimal collateralization.  Posterior circulation: The PICA origins are visualized and normal. Distal vertebral arteries are unremarkable. Vertebrobasilar junction is normal. Basilar artery is normal. The left posterior cerebral artery originates from the basilar tip. The  right posterior cerebral artery is of fetal type. Mild irregularity of PCA branch vessels bilaterally. No significant proximal stenosis or occlusion is present. There is no aneurysm. Venous sinuses: Dural sinuses are patent. Straight sinus deep cerebral veins are intact. Cortical veins are unremarkable. Anatomic variants: None Review of the MIP images confirms the above findings CT Brain Perfusion Findings: CBF (<30%) Volume: 45mL Perfusion (Tmax>6.0s) volume: 142mL Mismatch Volume: 97mL Infarction Location:Right MCA territory IMPRESSION: 1. Emergent large vessel occlusion involving the distal right M1 segment. Poor collateralization. 2. Right MCA territory infarct with estimated volume of 45 mL. 3. Larger area of penumbra involving the majority of the right MCA territory, 142 mL. 4. 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 5. Moderate stenosis at the origin of the dominant left vertebral artery. 6. Tortuosity and vessel irregularity of the cervical internal carotid arteries bilaterally suggesting fibromuscular dysplasia. No associated stenosis or dissection. 7. Moderate diffuse small vessel disease throughout the left MCA branch vessels, bilateral ACA branch vessels, and bilateral PCA branch vessels. These results were called by telephone at the time of interpretation on 11/04/2017 at 11:28 am to Dr. Bethann BerkshireJOSEPH ZAMMIT , who verbally acknowledged these results. Electronically Signed   By: Marin Robertshristopher  Mattern M.D.   On: 11/04/2017 11:48   Dg Chest Port 1 View  Result Date: 10/31/2017 CLINICAL DATA:  Sudden onset dizziness and weakness starting at 1500 hours. EXAM: PORTABLE CHEST 1 VIEW COMPARISON:  12/27/2015 FINDINGS: Stable cardiomegaly with moderate aortic atherosclerosis. Slight hyperinflation lungs without pulmonary consolidation, effusion or  pneumothorax. Slight pulmonary vascular congestion is redemonstrated. Surgical clips project over the base of neck and superior mediastinum as before. Osteoarthritis of the included AC and glenohumeral joints slight high-riding of the right humeral head. IMPRESSION: Stable cardiomegaly with aortic atherosclerosis. Mild pulmonary vascular congestion. Electronically Signed   By: Tollie Ethavid  Kwon M.D.   On: 10/31/2017 16:30    Assessment/Plan  End of Life Care  H/o Acute Right MCA stroke with Right Hemiparesis Atrial Flutter Hypertension Will discontinue All pO Meds. Roxanol and Ativan for Comfort. D/W the family and Hospice Nurse  Family/ staff Communication:   Labs/tests ordered:

## 2017-12-02 DEATH — deceased

## 2018-07-01 IMAGING — US US EXTREM LOW ARTERIAL SEG MULTIPLE*R*
1 series · 13 of 25 positions shown · non-contrast
Comparison: ABI examination 12/28/2015

CLINICAL DATA: Right great toe gangrene and osteomyelitis.

EXAM:
UNILATERAL RIGHT LOWER EXTREMITY ARTERIAL DUPLEX SCAN
TECHNIQUE: Gray-scale sonography as well as color Doppler and duplex ultrasound
was performed to evaluate the arteries of the lower extremity.

[Series 1: us extrem low arterial seg multiple*right* · 0.06mm/px · 13 of 40 slices shown]
[im 1/40]
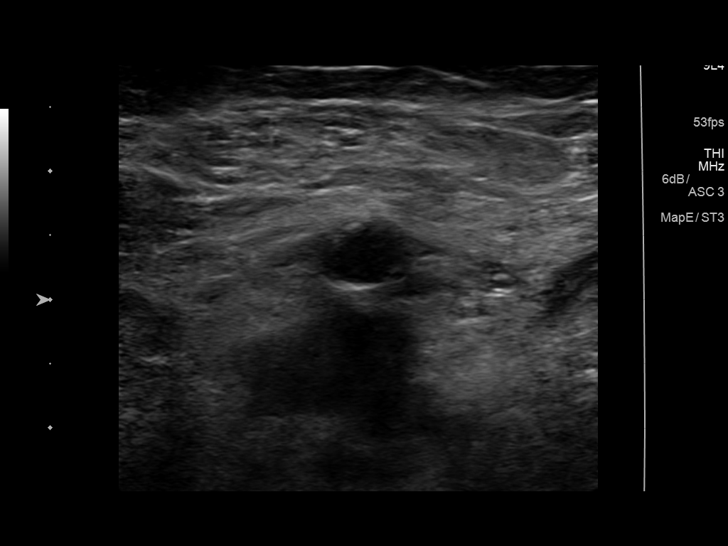
[im 4/40]
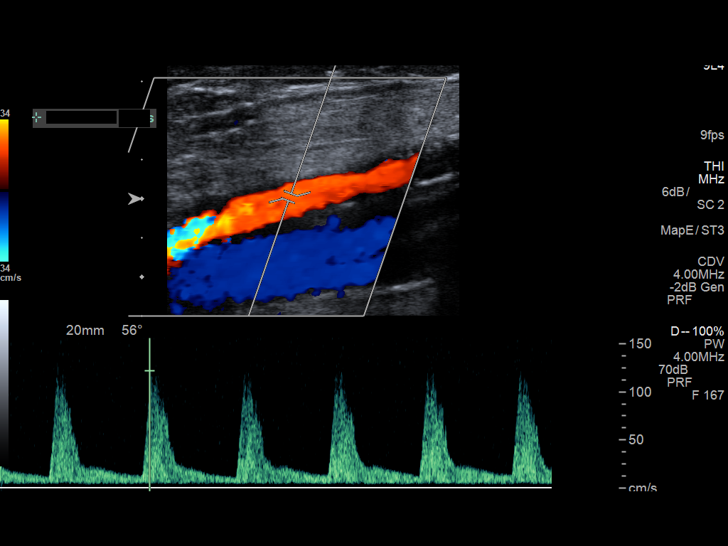
[im 7/40]
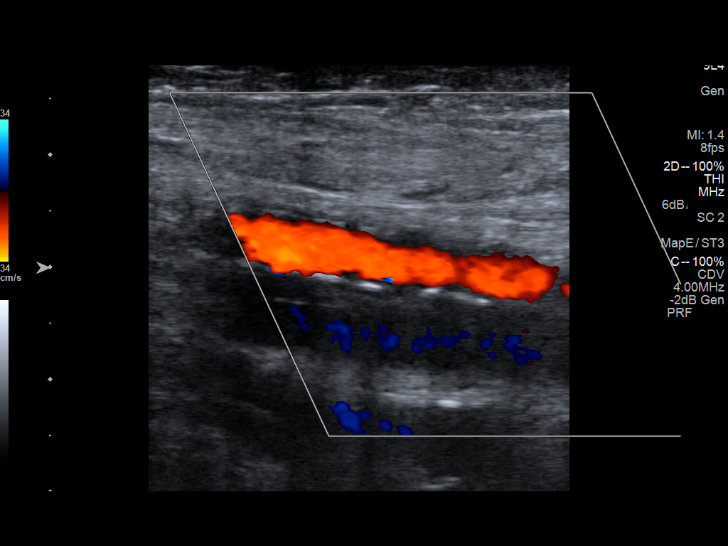
[im 10/40]
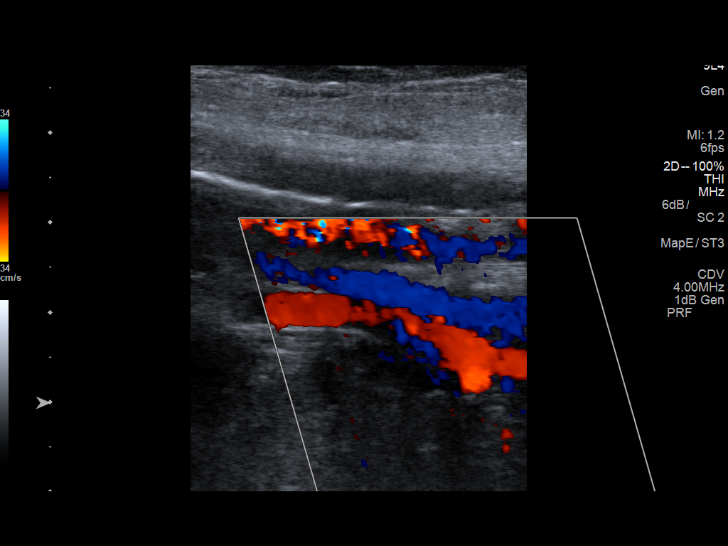
[im 14/40]
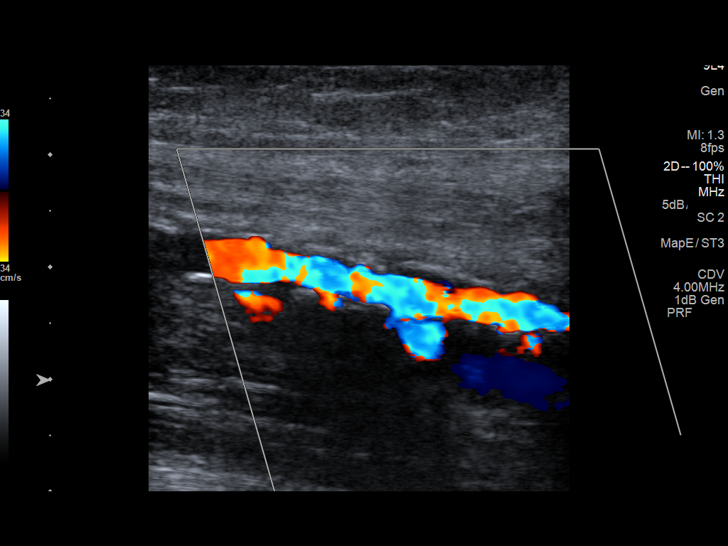
[im 17/40]
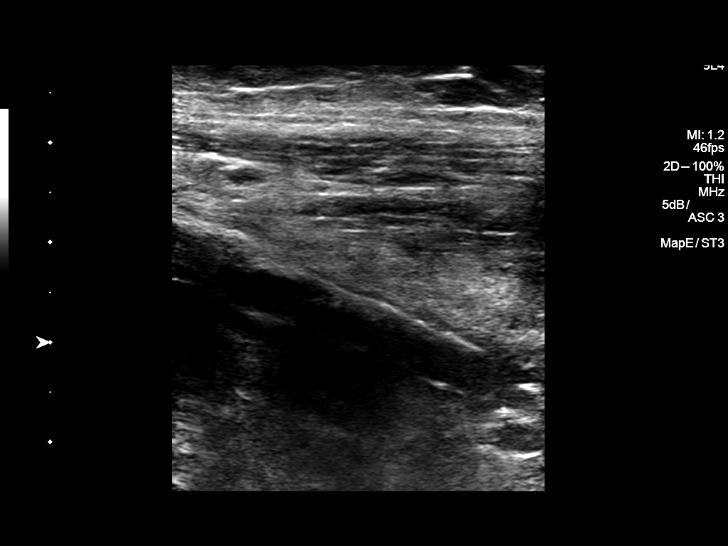
[im 20/40]
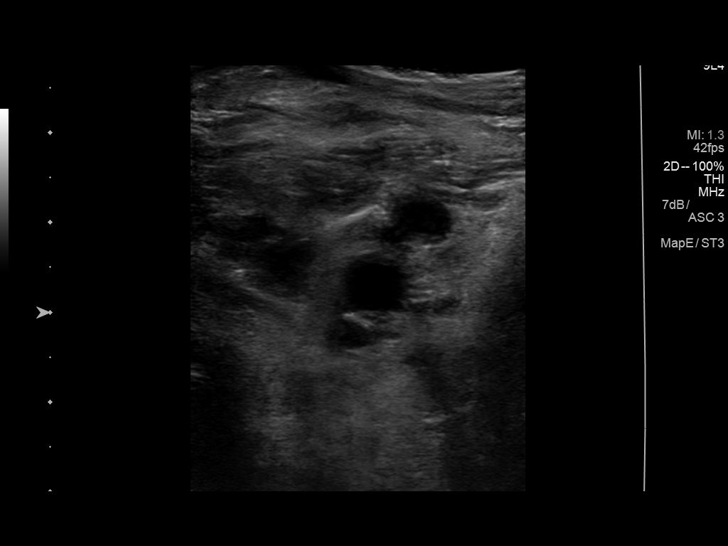
[im 23/40]
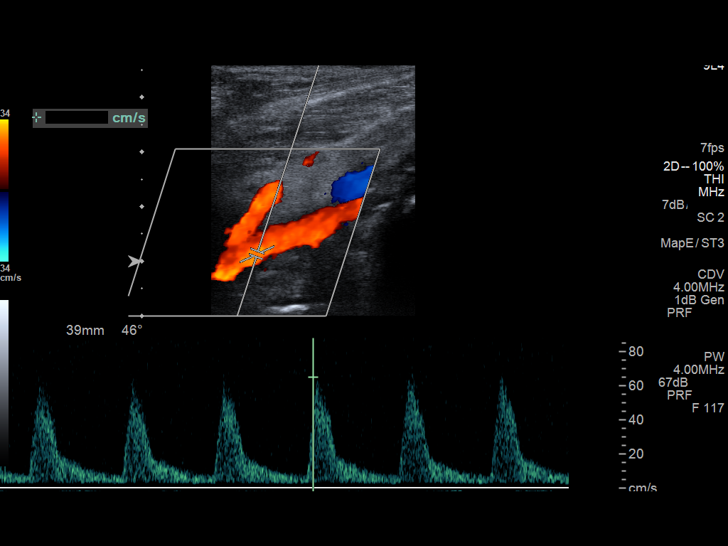
[im 27/40]
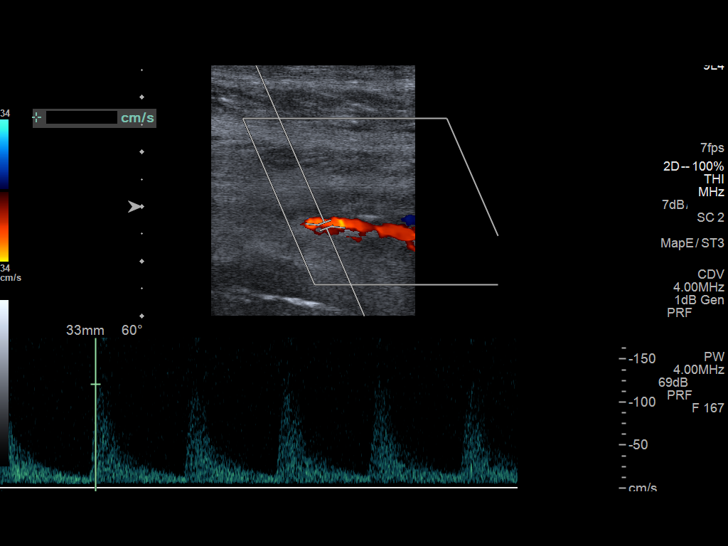
[im 30/40]
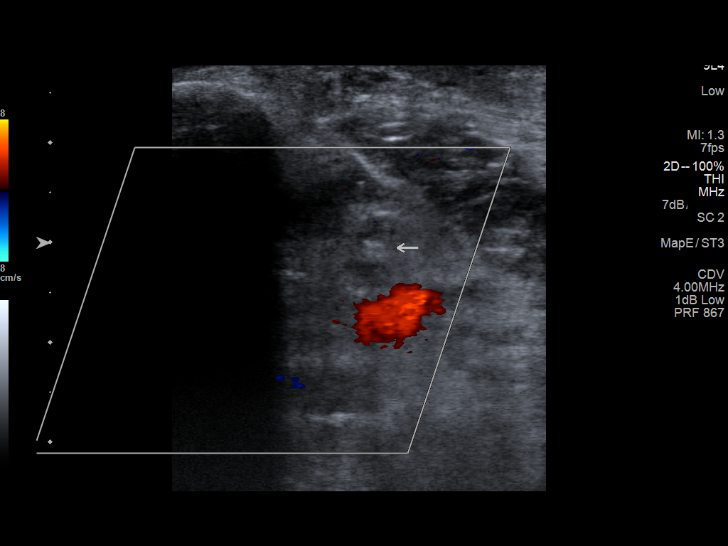
[im 33/40]
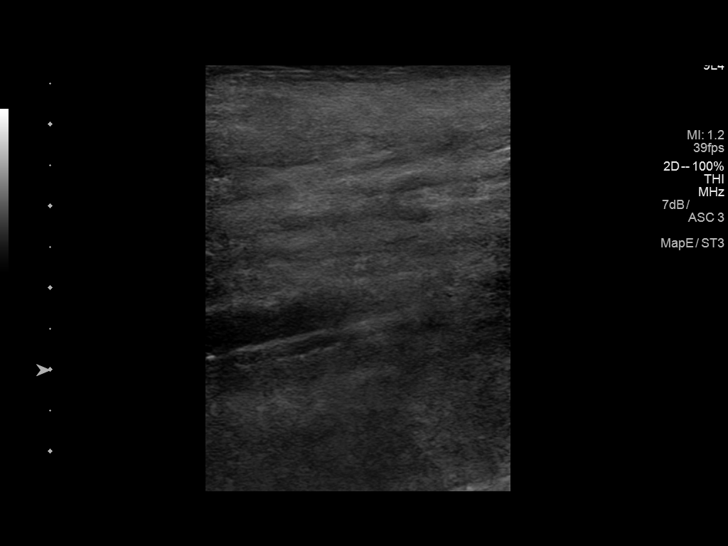
[im 36/40]
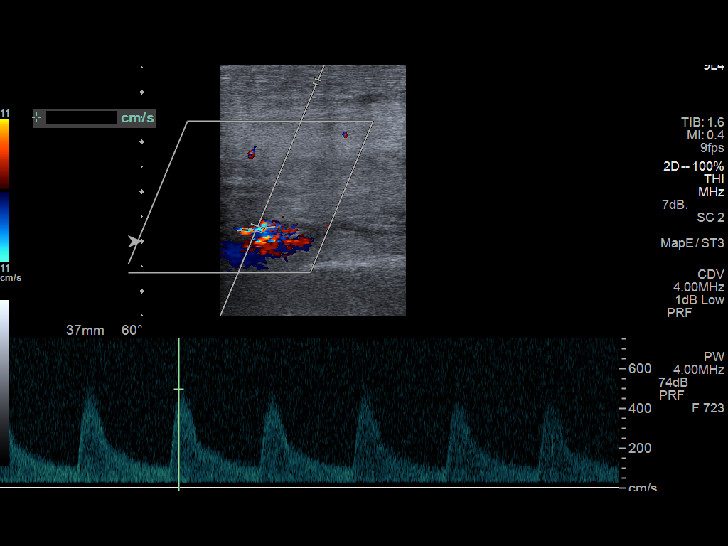
[im 40/40]
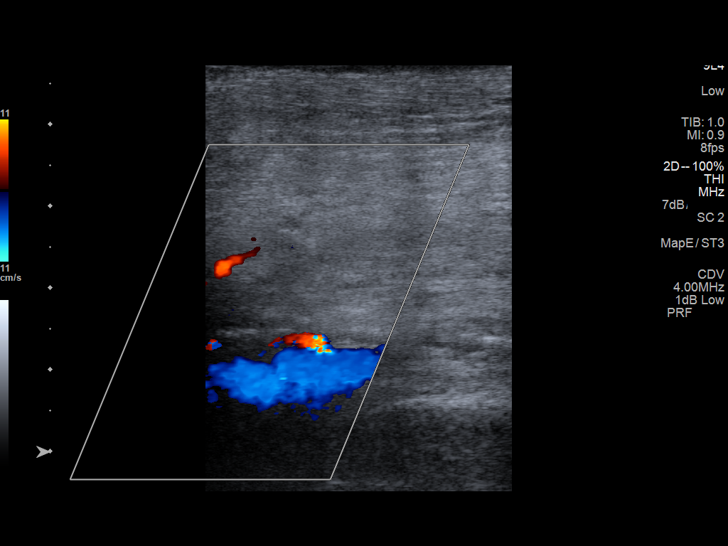

[13 of 25 positions shown; findings below may reference images not displayed]

FINDINGS: Atherosclerotic calcifications in the right common femoral artery
with a monophasic waveform. Deep femoral artery is patent with
monophasic waveform. Calcifications and plaque in the right SFA.
Monophasic waveforms throughout the right SFA. Peak systolic
velocity in the proximal right SFA is 133 cm/sec and the peak
systolic velocity in the mid SFA is 221 cm/sec. Atherosclerotic
calcifications in the right popliteal artery with monophasic
waveform. Monophasic waveform in the right peroneal artery.
Monophasic waveform in the right posterior tibial artery. The distal
right posterior tibial artery may be occluded. Monophasic waveform
in the anterior tibial artery. Markedly elevated peak systolic
velocity in the proximal anterior tibial artery measuring up to 496
cm/sec, this is compatible with stenosis.
IMPRESSION: Diffuse atherosclerotic disease throughout the right lower extremity
arteries.

Right runoff disease demonstrated by stenosis in the right anterior
tibial artery and occlusion or minimal flow in the distal posterior
tibial artery.

At least mild stenosis in the mid right SFA.

Monophasic waveforms throughout the right lower extremity may
signify underlying inflow disease.
